# Patient Record
Sex: Male | Born: 1960 | Race: White | Hispanic: No | Marital: Single | Smoking: Never smoker
Health system: Southern US, Community
[De-identification: ages and names within clinical notes are randomized; demographics above are authoritative.]

## PROBLEM LIST (undated history)

## (undated) DIAGNOSIS — E1140 Type 2 diabetes mellitus with diabetic neuropathy, unspecified: Secondary | ICD-10-CM

## (undated) DIAGNOSIS — M1990 Unspecified osteoarthritis, unspecified site: Secondary | ICD-10-CM

## (undated) DIAGNOSIS — D649 Anemia, unspecified: Secondary | ICD-10-CM

## (undated) DIAGNOSIS — M869 Osteomyelitis, unspecified: Secondary | ICD-10-CM

## (undated) DIAGNOSIS — R51 Headache: Secondary | ICD-10-CM

## (undated) DIAGNOSIS — I10 Essential (primary) hypertension: Secondary | ICD-10-CM

## (undated) DIAGNOSIS — R011 Cardiac murmur, unspecified: Secondary | ICD-10-CM

## (undated) DIAGNOSIS — I739 Peripheral vascular disease, unspecified: Secondary | ICD-10-CM

## (undated) DIAGNOSIS — R519 Headache, unspecified: Secondary | ICD-10-CM

## (undated) DIAGNOSIS — E119 Type 2 diabetes mellitus without complications: Secondary | ICD-10-CM

## (undated) HISTORY — DX: Type 2 diabetes mellitus without complications: E11.9

## (undated) HISTORY — PX: OTHER SURGICAL HISTORY: SHX169

## (undated) HISTORY — PX: KNEE SURGERY: SHX244

---

## 2001-10-11 ENCOUNTER — Encounter: Payer: Self-pay | Admitting: Emergency Medicine

## 2001-10-11 ENCOUNTER — Emergency Department (HOSPITAL_COMMUNITY): Admission: EM | Admit: 2001-10-11 | Discharge: 2001-10-12 | Payer: Self-pay | Admitting: Emergency Medicine

## 2001-10-12 ENCOUNTER — Encounter (HOSPITAL_COMMUNITY): Admission: RE | Admit: 2001-10-12 | Discharge: 2001-10-31 | Payer: Self-pay | Admitting: Emergency Medicine

## 2014-01-11 ENCOUNTER — Emergency Department (HOSPITAL_COMMUNITY): Payer: Self-pay

## 2014-01-11 ENCOUNTER — Inpatient Hospital Stay (HOSPITAL_COMMUNITY)
Admission: EM | Admit: 2014-01-11 | Discharge: 2014-01-15 | DRG: 617 | Disposition: A | Discharge status: Home Health | Payer: Self-pay | Attending: Internal Medicine | Admitting: Internal Medicine

## 2014-01-11 ENCOUNTER — Encounter (HOSPITAL_COMMUNITY): Payer: Self-pay | Admitting: Emergency Medicine

## 2014-01-11 DIAGNOSIS — L02419 Cutaneous abscess of limb, unspecified: Secondary | ICD-10-CM

## 2014-01-11 DIAGNOSIS — M19072 Primary osteoarthritis, left ankle and foot: Secondary | ICD-10-CM

## 2014-01-11 DIAGNOSIS — I739 Peripheral vascular disease, unspecified: Secondary | ICD-10-CM

## 2014-01-11 DIAGNOSIS — E1152 Type 2 diabetes mellitus with diabetic peripheral angiopathy with gangrene: Secondary | ICD-10-CM | POA: Diagnosis present

## 2014-01-11 DIAGNOSIS — E1165 Type 2 diabetes mellitus with hyperglycemia: Principal | ICD-10-CM | POA: Diagnosis present

## 2014-01-11 DIAGNOSIS — E871 Hypo-osmolality and hyponatremia: Secondary | ICD-10-CM | POA: Diagnosis present

## 2014-01-11 DIAGNOSIS — R739 Hyperglycemia, unspecified: Secondary | ICD-10-CM

## 2014-01-11 DIAGNOSIS — R7309 Other abnormal glucose: Secondary | ICD-10-CM

## 2014-01-11 DIAGNOSIS — D72829 Elevated white blood cell count, unspecified: Secondary | ICD-10-CM | POA: Diagnosis present

## 2014-01-11 DIAGNOSIS — R7881 Bacteremia: Secondary | ICD-10-CM | POA: Diagnosis present

## 2014-01-11 DIAGNOSIS — E1159 Type 2 diabetes mellitus with other circulatory complications: Secondary | ICD-10-CM | POA: Diagnosis present

## 2014-01-11 DIAGNOSIS — E11621 Type 2 diabetes mellitus with foot ulcer: Secondary | ICD-10-CM

## 2014-01-11 DIAGNOSIS — M86179 Other acute osteomyelitis, unspecified ankle and foot: Secondary | ICD-10-CM | POA: Diagnosis present

## 2014-01-11 DIAGNOSIS — M9080 Osteopathy in diseases classified elsewhere, unspecified site: Secondary | ICD-10-CM | POA: Diagnosis present

## 2014-01-11 DIAGNOSIS — M869 Osteomyelitis, unspecified: Secondary | ICD-10-CM | POA: Diagnosis present

## 2014-01-11 DIAGNOSIS — L02619 Cutaneous abscess of unspecified foot: Secondary | ICD-10-CM | POA: Diagnosis present

## 2014-01-11 DIAGNOSIS — E1169 Type 2 diabetes mellitus with other specified complication: Principal | ICD-10-CM

## 2014-01-11 DIAGNOSIS — E119 Type 2 diabetes mellitus without complications: Secondary | ICD-10-CM | POA: Diagnosis present

## 2014-01-11 DIAGNOSIS — L03119 Cellulitis of unspecified part of limb: Secondary | ICD-10-CM | POA: Diagnosis present

## 2014-01-11 DIAGNOSIS — Z113 Encounter for screening for infections with a predominantly sexual mode of transmission: Secondary | ICD-10-CM

## 2014-01-11 DIAGNOSIS — L089 Local infection of the skin and subcutaneous tissue, unspecified: Secondary | ICD-10-CM

## 2014-01-11 DIAGNOSIS — I96 Gangrene, not elsewhere classified: Secondary | ICD-10-CM | POA: Diagnosis present

## 2014-01-11 DIAGNOSIS — L97509 Non-pressure chronic ulcer of other part of unspecified foot with unspecified severity: Secondary | ICD-10-CM | POA: Diagnosis present

## 2014-01-11 DIAGNOSIS — L97809 Non-pressure chronic ulcer of other part of unspecified lower leg with unspecified severity: Secondary | ICD-10-CM | POA: Diagnosis present

## 2014-01-11 DIAGNOSIS — M86171 Other acute osteomyelitis, right ankle and foot: Secondary | ICD-10-CM | POA: Diagnosis present

## 2014-01-11 DIAGNOSIS — IMO0002 Reserved for concepts with insufficient information to code with codable children: Principal | ICD-10-CM | POA: Diagnosis present

## 2014-01-11 DIAGNOSIS — B9689 Other specified bacterial agents as the cause of diseases classified elsewhere: Secondary | ICD-10-CM | POA: Diagnosis present

## 2014-01-11 LAB — CBC WITH DIFFERENTIAL/PLATELET
Basophils Absolute: 0 10*3/uL (ref 0.0–0.1)
Basophils Relative: 0 % (ref 0–1)
Eosinophils Absolute: 0.2 10*3/uL (ref 0.0–0.7)
Eosinophils Relative: 1 % (ref 0–5)
HCT: 40.6 % (ref 39.0–52.0)
Hemoglobin: 13.5 g/dL (ref 13.0–17.0)
Lymphocytes Relative: 7 % — ABNORMAL LOW (ref 12–46)
Lymphs Abs: 1.2 10*3/uL (ref 0.7–4.0)
MCH: 31 pg (ref 26.0–34.0)
MCHC: 33.3 g/dL (ref 30.0–36.0)
MCV: 93.1 fL (ref 78.0–100.0)
Monocytes Absolute: 1.1 10*3/uL — ABNORMAL HIGH (ref 0.1–1.0)
Monocytes Relative: 6 % (ref 3–12)
Neutro Abs: 14.2 10*3/uL — ABNORMAL HIGH (ref 1.7–7.7)
Neutrophils Relative %: 86 % — ABNORMAL HIGH (ref 43–77)
Platelets: 387 10*3/uL (ref 150–400)
RBC: 4.36 MIL/uL (ref 4.22–5.81)
RDW: 12.5 % (ref 11.5–15.5)
WBC: 16.8 10*3/uL — ABNORMAL HIGH (ref 4.0–10.5)

## 2014-01-11 LAB — CBG MONITORING, ED
GLUCOSE-CAPILLARY: 77 mg/dL (ref 70–99)
Glucose-Capillary: 294 mg/dL — ABNORMAL HIGH (ref 70–99)

## 2014-01-11 LAB — I-STAT TROPONIN, ED: TROPONIN I, POC: 0 ng/mL (ref 0.00–0.08)

## 2014-01-11 LAB — COMPREHENSIVE METABOLIC PANEL
ALT: 12 U/L (ref 0–53)
AST: 14 U/L (ref 0–37)
Albumin: 2.9 g/dL — ABNORMAL LOW (ref 3.5–5.2)
Alkaline Phosphatase: 68 U/L (ref 39–117)
BUN: 13 mg/dL (ref 6–23)
CO2: 23 mEq/L (ref 19–32)
Calcium: 8.9 mg/dL (ref 8.4–10.5)
Chloride: 89 mEq/L — ABNORMAL LOW (ref 96–112)
Creatinine, Ser: 0.87 mg/dL (ref 0.50–1.35)
GFR calc Af Amer: >90 mL/min (ref >90)
GFR calc non Af Amer: >90 mL/min (ref >90)
Glucose, Bld: 327 mg/dL — ABNORMAL HIGH (ref 70–99)
Potassium: 4.1 mEq/L (ref 3.7–5.3)
Sodium: 126 mEq/L — ABNORMAL LOW (ref 137–147)
Total Bilirubin: 0.3 mg/dL (ref 0.3–1.2)
Total Protein: 8.6 g/dL — ABNORMAL HIGH (ref 6.0–8.3)

## 2014-01-11 LAB — I-STAT CG4 LACTIC ACID, ED: Lactic Acid, Venous: 1.58 mmol/L (ref 0.5–2.2)

## 2014-01-11 LAB — PRO B NATRIURETIC PEPTIDE: Pro B Natriuretic peptide (BNP): 148.5 pg/mL — ABNORMAL HIGH (ref 0–125)

## 2014-01-11 MED ORDER — VANCOMYCIN HCL IN DEXTROSE 1-5 GM/200ML-% IV SOLN
1000.0000 mg | Freq: Once | INTRAVENOUS | Status: AC
  Administered 2014-01-11: 1000 mg via INTRAVENOUS
  Filled 2014-01-11: qty 200

## 2014-01-11 MED ORDER — SODIUM CHLORIDE 0.9 % IV SOLN
INTRAVENOUS | Status: DC
  Administered 2014-01-11 – 2014-01-13 (×3): via INTRAVENOUS

## 2014-01-11 MED ORDER — OXYCODONE HCL 5 MG PO TABS: 5.0000 mg | ORAL_TABLET | ORAL | Status: DC | PRN

## 2014-01-11 MED ORDER — ONDANSETRON HCL 4 MG PO TABS: 4.0000 mg | ORAL_TABLET | Freq: Four times a day (QID) | ORAL | Status: DC | PRN

## 2014-01-11 MED ORDER — INSULIN ASPART 100 UNIT/ML ~~LOC~~ SOLN
0.0000 [IU] | Freq: Three times a day (TID) | SUBCUTANEOUS | Status: DC
  Administered 2014-01-12: 2 [IU] via SUBCUTANEOUS
  Administered 2014-01-12: 3 [IU] via SUBCUTANEOUS
  Administered 2014-01-14 – 2014-01-15 (×3): 2 [IU] via SUBCUTANEOUS
  Administered 2014-01-15: 3 [IU] via SUBCUTANEOUS

## 2014-01-11 MED ORDER — ACETAMINOPHEN 325 MG PO TABS: 650.0000 mg | ORAL_TABLET | Freq: Four times a day (QID) | ORAL | Status: DC | PRN

## 2014-01-11 MED ORDER — INSULIN ASPART 100 UNIT/ML ~~LOC~~ SOLN
5.0000 [IU] | Freq: Once | SUBCUTANEOUS | Status: AC
  Administered 2014-01-11: 5 [IU] via INTRAVENOUS
  Filled 2014-01-11: qty 1

## 2014-01-11 MED ORDER — MORPHINE SULFATE 2 MG/ML IJ SOLN: 2.0000 mg | INTRAMUSCULAR | Status: DC | PRN

## 2014-01-11 MED ORDER — INSULIN DETEMIR 100 UNIT/ML ~~LOC~~ SOLN
10.0000 [IU] | Freq: Every day | SUBCUTANEOUS | Status: DC
  Administered 2014-01-11 – 2014-01-15 (×5): 10 [IU] via SUBCUTANEOUS
  Filled 2014-01-11 (×5): qty 0.1

## 2014-01-11 MED ORDER — ACETAMINOPHEN 650 MG RE SUPP: 650.0000 mg | Freq: Four times a day (QID) | RECTAL | Status: DC | PRN

## 2014-01-11 MED ORDER — ENOXAPARIN SODIUM 40 MG/0.4ML ~~LOC~~ SOLN
40.0000 mg | Freq: Every day | SUBCUTANEOUS | Status: DC
  Administered 2014-01-11 – 2014-01-12 (×2): 40 mg via SUBCUTANEOUS
  Filled 2014-01-11 (×3): qty 0.4

## 2014-01-11 MED ORDER — SODIUM CHLORIDE 0.9 % IV SOLN: INTRAVENOUS | Status: DC

## 2014-01-11 MED ORDER — PIPERACILLIN-TAZOBACTAM 3.375 G IVPB
3.3750 g | Freq: Three times a day (TID) | INTRAVENOUS | Status: DC
  Administered 2014-01-11 – 2014-01-14 (×9): 3.375 g via INTRAVENOUS
  Filled 2014-01-11 (×10): qty 50

## 2014-01-11 MED ORDER — ONDANSETRON HCL 4 MG/2ML IJ SOLN: 4.0000 mg | Freq: Four times a day (QID) | INTRAMUSCULAR | Status: DC | PRN

## 2014-01-11 MED ORDER — INSULIN ASPART 100 UNIT/ML ~~LOC~~ SOLN
0.0000 [IU] | Freq: Every day | SUBCUTANEOUS | Status: DC
  Administered 2014-01-11 – 2014-01-13 (×2): 2 [IU] via SUBCUTANEOUS

## 2014-01-11 MED ORDER — SODIUM CHLORIDE 0.9 % IV SOLN: INTRAVENOUS | Status: AC

## 2014-01-11 NOTE — ED Notes (Signed)
Pt is in X-ray. Will get EKG when he returns.

## 2014-01-11 NOTE — ED Notes (Signed)
Pt reports bil necrosis to feet. Right big toe is black. On left foot diabetic ulcers present. Pt denies pain. Friend reports 3 weeks ago pts feet were completely normal. Pt reports he believes he is diabetic but has never been dx and cant afford to go to the doctor.

## 2014-01-11 NOTE — ED Notes (Signed)
Attempted to call report to [phone]. RN unavailable. Will call back.

## 2014-01-11 NOTE — ED Notes (Signed)
Pt states he is unable to void at this time.

## 2014-01-11 NOTE — ED Provider Notes (Signed)
CSN: [id number]     Arrival date & time 01/11/14  1823 History   First MD Initiated Contact with Patient 01/11/14 1838     Chief Complaint  Patient presents with  . necrosis to bil feet      (Consider location/radiation/quality/duration/timing/severity/associated sxs/prior Treatment) The history is provided by the patient.   53 year old male with unknown history of diabetes. But due to to a neighbor started doing his blood sugar test as several weeks ago. Noted his blood sugars were high and treating himself with diet. Patient now presents with bilateral necrosis to the feet right greater than left. Right great toe is black and necrotic with dry gangrene. Also has redness to the right foot and sores along the left foot and leg. Patient denies any symptoms states really no pain has no sensation in his feet. The patient has numbness but denies fevers nausea vomiting chest pain shortness of breath.  History reviewed. No pertinent past medical history. History reviewed. No pertinent past surgical history. History reviewed. No pertinent family history. History  Substance Use Topics  . Smoking status: Never Smoker   . Smokeless tobacco: Not on file  . Alcohol Use: Yes     Comment: occasionally    Review of Systems  Constitutional: Negative for fever.  HENT: Negative for congestion.   Eyes: Negative for visual disturbance.  Respiratory: Negative for shortness of breath.   Cardiovascular: Negative for chest pain.  Gastrointestinal: Negative for nausea, vomiting and abdominal pain.  Endocrine: Positive for polydipsia and polyuria.  Genitourinary: Negative for dysuria.  Musculoskeletal: Negative for back pain.  Skin: Positive for wound.  Neurological: Positive for numbness.  Hematological: Does not bruise/bleed easily.  Psychiatric/Behavioral: Negative for confusion.      Allergies  Review of patient's allergies indicates no known allergies.  Home Medications   Prior to Admission  medications   Not on File   BP 129/91  Pulse 108  Temp(Src) 99.5 F (37.5 C) (Oral)  Resp 14  Wt 168 lb (76.204 kg)  SpO2 99% Physical Exam  Nursing note and vitals reviewed. Constitutional: He is oriented to person, place, and time. He appears well-developed and well-nourished. No distress.  HENT:  Head: Normocephalic and atraumatic.  Mouth/Throat: Oropharynx is clear and moist.  Eyes: Conjunctivae and EOM are normal. Pupils are equal, round, and reactive to light.  Neck: Normal range of motion.  Cardiovascular: Regular rhythm and normal heart sounds.   Slightly tachycardic  Pulmonary/Chest: Effort normal and breath sounds normal. No respiratory distress.  Abdominal: Soft. Bowel sounds are normal. There is no tenderness.  Musculoskeletal: He exhibits edema. He exhibits no tenderness.  Right foot with black and dry necrotic great toe. Proximal part of the toe he and forefoot with erythema and crepitance. No pain. Cap refill to the second is normal. Ferritin is redness up of to just proximal to the ankle. The left foot with a dry ulcer at the tip of the left great toe. Shin and foot with dry ulcers. With some surrounding erythema. No crepitance. Cap refill there is about 3 seconds.  Neurological: He is alert and oriented to person, place, and time. No cranial nerve deficit. He exhibits normal muscle tone. Coordination normal.  Skin: Skin is warm. There is erythema.    ED Course  Procedures (including critical care time) Labs Review Labs Reviewed  CBC WITH DIFFERENTIAL - Abnormal; Notable for the following:    WBC 16.8 (*)    Neutrophils Relative % 86 (*)  Neutro Abs 14.2 (*)    Lymphocytes Relative 7 (*)    Monocytes Absolute 1.1 (*)    All other components within normal limits  COMPREHENSIVE METABOLIC PANEL - Abnormal; Notable for the following:    Sodium 126 (*)    Chloride 89 (*)    Glucose, Bld 327 (*)    Total Protein 8.6 (*)    Albumin 2.9 (*)    All other  components within normal limits  PRO B NATRIURETIC PEPTIDE - Abnormal; Notable for the following:    Pro B Natriuretic peptide (BNP) 148.5 (*)    All other components within normal limits  CBG MONITORING, ED - Abnormal; Notable for the following:    Glucose-Capillary 294 (*)    All other components within normal limits  CULTURE, BLOOD (ROUTINE X 2)  CULTURE, BLOOD (ROUTINE X 2)  URINALYSIS, ROUTINE W REFLEX MICROSCOPIC  I-STAT CG4 LACTIC ACID, ED  I-STAT TROPOININ, ED   Results for orders placed during the hospital encounter of 01/11/14  CBC WITH DIFFERENTIAL      Result Value Ref Range   WBC 16.8 (*) 4.0 - 10.5 K/uL   RBC 4.36  4.22 - 5.81 MIL/uL   Hemoglobin 13.5  13.0 - 17.0 g/dL   HCT 16.1  09.6 - 04.5 %   MCV 93.1  78.0 - 100.0 fL   MCH 31.0  26.0 - 34.0 pg   MCHC 33.3  30.0 - 36.0 g/dL   RDW 40.9  81.1 - 91.4 %   Platelets 387  150 - 400 K/uL   Neutrophils Relative % 86 (*) 43 - 77 %   Neutro Abs 14.2 (*) 1.7 - 7.7 K/uL   Lymphocytes Relative 7 (*) 12 - 46 %   Lymphs Abs 1.2  0.7 - 4.0 K/uL   Monocytes Relative 6  3 - 12 %   Monocytes Absolute 1.1 (*) 0.1 - 1.0 K/uL   Eosinophils Relative 1  0 - 5 %   Eosinophils Absolute 0.2  0.0 - 0.7 K/uL   Basophils Relative 0  0 - 1 %   Basophils Absolute 0.0  0.0 - 0.1 K/uL  COMPREHENSIVE METABOLIC PANEL      Result Value Ref Range   Sodium 126 (*) 137 - 147 mEq/L   Potassium 4.1  3.7 - 5.3 mEq/L   Chloride 89 (*) 96 - 112 mEq/L   CO2 23  19 - 32 mEq/L   Glucose, Bld 327 (*) 70 - 99 mg/dL   BUN 13  6 - 23 mg/dL   Creatinine, Ser 7.82  0.50 - 1.35 mg/dL   Calcium 8.9  8.4 - 95.6 mg/dL   Total Protein 8.6 (*) 6.0 - 8.3 g/dL   Albumin 2.9 (*) 3.5 - 5.2 g/dL   AST 14  0 - 37 U/L   ALT 12  0 - 53 U/L   Alkaline Phosphatase 68  39 - 117 U/L   Total Bilirubin 0.3  0.3 - 1.2 mg/dL   GFR calc non Af Amer >90  >90 mL/min   GFR calc Af Amer >90  >90 mL/min  PRO B NATRIURETIC PEPTIDE      Result Value Ref Range   Pro B  Natriuretic peptide (BNP) 148.5 (*) 0 - 125 pg/mL  I-STAT CG4 LACTIC ACID, ED      Result Value Ref Range   Lactic Acid, Venous 1.58  0.5 - 2.2 mmol/L  I-STAT TROPOININ, ED      Result Value Ref  Range   Troponin i, poc 0.00  0.00 - 0.08 ng/mL   Comment 3           CBG MONITORING, ED      Result Value Ref Range   Glucose-Capillary 294 (*) 70 - 99 mg/dL   Comment 1 Documented in Chart     Comment 2 Notify RN       Imaging Review Dg Tibia/fibula Left  01/11/2014   CLINICAL DATA:  Painful, bilateral painful feet and lower legs. Redness, swelling.  EXAM: LEFT TIBIA AND FIBULA - 2 VIEW  COMPARISON:  None.  FINDINGS: No acute bony abnormality. Specifically, no fracture, subluxation, or dislocation. Soft tissues are intact. 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 No knee joint effusion.  IMPRESSION: No acute bony abnormality.   Electronically Signed   By: Charlett Nose M.D.   On: 01/11/2014 19:53   Dg Tibia/fibula Right  01/11/2014   CLINICAL DATA:  Bilateral lower extremity redness, swelling, pain.  EXAM: RIGHT TIBIA AND FIBULA - 2 VIEW  COMPARISON:  None.  FINDINGS: Mild to moderate degenerative changes within the right knee with joint space narrowing and spurring. No acute bony abnormality. Specifically, no fracture, subluxation, or dislocation. Soft tissues are intact.  IMPRESSION: No acute bony abnormality.   Electronically Signed   By: Charlett Nose M.D.   On: 01/11/2014 19:54   Dg Chest Port 1 View  01/11/2014   CLINICAL DATA:  Bilateral lower extremity infections.  EXAM: PORTABLE CHEST - 1 VIEW  COMPARISON:  None.  FINDINGS: The heart size and mediastinal contours are within normal limits. Both lungs are clear. The visualized skeletal structures are unremarkable.  IMPRESSION: No active disease.   Electronically Signed   By: Charlett Nose M.D.   On: 01/11/2014 19:25   Dg Foot Complete Left  01/11/2014   CLINICAL DATA:  Bilateral foot pain, redness, swelling.  EXAM: LEFT FOOT - COMPLETE 3+ VIEW   COMPARISON:  None.  FINDINGS: There is irregular lucency at the tip of the left great toe distal phalanx concerning for osteomyelitis. There appears to be a overlying soft tissue defect at the tip of the great toe. Recommend clinical correlation.  No additional acute bony abnormality. No fracture, subluxation or dislocation.  IMPRESSION: Findings suspicious for osteomyelitis at the tip of the left great toe distal phalanx.   Electronically Signed   By: Charlett Nose M.D.   On: 01/11/2014 19:53   Dg Foot Complete Right  01/11/2014   CLINICAL DATA:  Infection.  EXAM: RIGHT FOOT COMPLETE - 3+ VIEW  COMPARISON:  Ankle films of the same day.  FINDINGS: Extensive gas surrounds the distal phalanx in the right great toe. There is marked osteopenia and erosion of the tuft. There is mild osteopenia in the distal aspect of the proximal phalanx is well. The joint is located.  Extensive soft tissue swelling is noted in the distal second toe is well with a sclerotic foreshortened distal phalanx. No other focal bone changes are evident. The proximal foot is unremarkable.  IMPRESSION: 1. Osteomyelitis of the distal phalanx in the great toe with a soft tissue gas producing abscess. 2. Soft tissue swelling about the distal aspect second toe with a sclerotic foreshortened distal phalanx. This may represent chronic infection.   Electronically Signed   By: Gennette Pac M.D.   On: 01/11/2014 19:55     EKG Interpretation   Date/Time:  Sunday January 11 2014 20:03:25 EDT Ventricular Rate:  88 PR Interval:  166 QRS  Duration: 88 QT Interval:  380 QTC Calculation: 460 R Axis:   63 Text Interpretation:  Sinus rhythm No previous ECGs available Confirmed by  Lima Chillemi  MD, Alfa Leibensperger [phone]([id number]) on 01/11/2014 8:18:24 PM      MDM   Final diagnoses:  Hyperglycemia  Diabetes  Osteomyelitis  Foot infection    Patient with untreated diabetes for unknown period of time. Patient started using a neighbors of blood sugar testing a few  months ago and noted that he had diabetes. The patient was treating himself with diet but no formal treatment by a physician. Patient presents today with bilateral foot infection right great toe is necrotic M. and dry gangrene. There is wet gangrene on the proximal part of the great toe the forefoot. X-ray shows gas in that area. There is evidence of osteomyelitis. Also evidence of osteo-myelitis of the left great toe. Blood sugar elevated but no evidence of DKA. Leukocytosis. Patient was cultured and started on vancomycin. The right great toe has no oozing pus. Patient has good cap refill to the rest of the right foot that is 1 second.    Vanetta MuldersScott Brandii Lakey, MD 01/11/14 2035

## 2014-01-11 NOTE — ED Notes (Signed)
Asked pt for urine sample pt said he couldn't at the moment will try again

## 2014-01-11 NOTE — H&P (Signed)
[facility]                                                                                    Patient Demographics  Ryan [name], is a 53 y.o. male  CSN: [id number]  MRN: [id number]  DOB - [birth year]  Admit Date - 01/11/2014  Outpatient Primary MD for the patient is No primary provider on file.   With History of -  History reviewed. No pertinent past medical history.    History reviewed. No pertinent past surgical history.  in for   Chief Complaint  Patient presents with  . necrosis to bil feet      HPI  Ryan [name]  is a 53 y.o. male, with no significant past medical history who recently found out that he is diabetic 3 weeks ago while using a friend's glucometer presenting with 3 weeks history of lower extremity ulcers that have been progressing quickly. He took her off a friend of his who was being treated for MRSA and she died 2 weeks ago. The patient denies any previous episodes of pain or ulcers in his lower or upper extremities, no history of chest pains or shortness of breath. He first noticed this ulcer 3 weeks ago and they progressed to a right first toe gangrene and his friends brought him here to be evaluated. Patient reports taking his.friend's insulin to treat himself .    Review of Systems    In addition to the HPI above,  No Fever-chills, No Headache, No changes with Vision or hearing, No problems swallowing food or Liquids, No Chest pain, Cough or Shortness of Breath, No Abdominal pain, No Nausea or Vommitting, Bowel movements are regular, No Blood in stool or Urine, No dysuria, No new joints pains-aches,  No new weakness, tingling, numbness in any extremity, No recent weight gain or loss, No polyuria, polydypsia or polyphagia, No significant Mental Stressors.  A full 10 point Review of Systems was done, except as stated above, all other Review of Systems were negative.   Social History History  Substance Use Topics  .  Smoking status: Never Smoker   . Smokeless tobacco: Not on file  . Alcohol Use: Yes     Comment: occasionally     Family History Past and reviewed; patient doesn't know his family and was raised in multiple boarding houses  Prior to Admission medications   Medication Sig Start Date End Date Taking? Authorizing Provider  Cyanocobalamin (B-12 PO) Take 1 tablet by mouth daily.   Yes Historical Provider, MD  UNABLE TO FIND Apply 1 application topically daily. Silver shield gel   Yes Historical Provider, MD    No Known Allergies  Physical Exam  Vitals  Blood pressure 129/91, pulse 108, temperature 99.5 F (37.5 C), temperature source Oral, resp. rate 14, weight 76.204 kg (168 lb), SpO2 99.00%.   1. General in no acute distress, very pleasant  2. Apathic affect and insight, Not Suicidal or Homicidal, Awake Alert, Oriented X 3.  3. No obvious F.N deficits, ALL C.Nerves Intact, Strength 5/5 all 4 extremities, Sensation decreased in lower extremities ,   4. Ears and Eyes appear Normal, Conjunctivae clear,  PERRLA. Moist Oral Mucosa.  5. Supple Neck, No JVD, No cervical lymphadenopathy appriciated, No Carotid Bruits.  6. Symmetrical Chest wall movement, Good air movement bilaterally, CTAB.  7. RRR, No Gallops, Rubs or Murmurs, No Parasternal Heave.  8. Positive Bowel Sounds, Abdomen Soft, Non tender, No organomegaly appriciated,No rebound -guarding or rigidity.  9. right first toe dry gangrene with redness and swelling involving the distal right foot. 2 dry ulcers noted on the dorsal aspect of the left foot with swelling and redness around them . One of the ulcers is 2 cm in size and the other one was 4 cm. Significant redness noted around the ulcers  10. Good muscle tone,  joints appear normal , no effusions, Normal ROM.  11. No Palpable Lymph Nodes in Neck or Axillae    Data Review  CBC  Recent Labs Lab 01/11/14 1855  WBC 16.8*  HGB 13.5  HCT 40.6  PLT 387  MCV  93.1  MCH 31.0  MCHC 33.3  RDW 12.5  LYMPHSABS 1.2  MONOABS 1.1*  EOSABS 0.2  BASOSABS 0.0   ------------------------------------------------------------------------------------------------------------------  Chemistries   Recent Labs Lab 01/11/14 1855  NA 126*  K 4.1  CL 89*  CO2 23  GLUCOSE 327*  BUN 13  CREATININE 0.87  CALCIUM 8.9  AST 14  ALT 12  ALKPHOS 68  BILITOT 0.3   ------------------------------------------------------------------------------------------------------------------ CrCl is unknown because there is no height on file for the current visit. ------------------------------------------------------------------------------------------------------------------ No results found for this basename: TSH, T4TOTAL, FREET3, T3FREE, THYROIDAB,  in the last 72 hours   Coagulation profile No results found for this basename: INR, PROTIME,  in the last 168 hours ------------------------------------------------------------------------------------------------------------------- No results found for this basename: DDIMER,  in the last 72 hours -------------------------------------------------------------------------------------------------------------------  Cardiac Enzymes No results found for this basename: CK, CKMB, TROPONINI, MYOGLOBIN,  in the last 168 hours ------------------------------------------------------------------------------------------------------------------ No components found with this basename: POCBNP,    ---------------------------------------------------------------------------------------------------------------  Urinalysis No results found for this basename: colorurine, appearanceur, labspec, phurine, glucoseu, hgbur, bilirubinur, ketonesur, proteinur, urobilinogen, nitrite, leukocytesur    ----------------------------------------------------------------------------------------------------------------   Imaging results:   Dg  Tibia/fibula Left  01/11/2014   CLINICAL DATA:  Painful, bilateral painful feet and lower legs. Redness, swelling.  EXAM: LEFT TIBIA AND FIBULA - 2 VIEW  COMPARISON:  None.  FINDINGS: No acute bony abnormality. Specifically, no fracture, subluxation, or dislocation. Soft tissues are intact. 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 No knee joint effusion.  IMPRESSION: No acute bony abnormality.   Electronically Signed   By: Rolm Baptise M.D.   On: 01/11/2014 19:53   Dg Tibia/fibula Right  01/11/2014   CLINICAL DATA:  Bilateral lower extremity redness, swelling, pain.  EXAM: RIGHT TIBIA AND FIBULA - 2 VIEW  COMPARISON:  None.  FINDINGS: Mild to moderate degenerative changes within the right knee with joint space narrowing and spurring. No acute bony abnormality. Specifically, no fracture, subluxation, or dislocation. Soft tissues are intact.  IMPRESSION: No acute bony abnormality.   Electronically Signed   By: Rolm Baptise M.D.   On: 01/11/2014 19:54   Dg Chest Port 1 View  01/11/2014   CLINICAL DATA:  Bilateral lower extremity infections.  EXAM: PORTABLE CHEST - 1 VIEW  COMPARISON:  None.  FINDINGS: The heart size and mediastinal contours are within normal limits. Both lungs are clear. The visualized skeletal structures are unremarkable.  IMPRESSION: No active disease.   Electronically Signed   By: Rolm Baptise M.D.   On: 01/11/2014 19:25  Dg Foot Complete Left  01/11/2014   CLINICAL DATA:  Bilateral foot pain, redness, swelling.  EXAM: LEFT FOOT - COMPLETE 3+ VIEW  COMPARISON:  None.  FINDINGS: There is irregular lucency at the tip of the left great toe distal phalanx concerning for osteomyelitis. There appears to be a overlying soft tissue defect at the tip of the great toe. Recommend clinical correlation.  No additional acute bony abnormality. No fracture, subluxation or dislocation.  IMPRESSION: Findings suspicious for osteomyelitis at the tip of the left great toe distal phalanx.   Electronically Signed   By:  Rolm Baptise M.D.   On: 01/11/2014 19:53   Dg Foot Complete Right  01/11/2014   CLINICAL DATA:  Infection.  EXAM: RIGHT FOOT COMPLETE - 3+ VIEW  COMPARISON:  Ankle films of the same day.  FINDINGS: Extensive gas surrounds the distal phalanx in the right great toe. There is marked osteopenia and erosion of the tuft. There is mild osteopenia in the distal aspect of the proximal phalanx is well. The joint is located.  Extensive soft tissue swelling is noted in the distal second toe is well with a sclerotic foreshortened distal phalanx. No other focal bone changes are evident. The proximal foot is unremarkable.  IMPRESSION: 1. Osteomyelitis of the distal phalanx in the great toe with a soft tissue gas producing abscess. 2. Soft tissue swelling about the distal aspect second toe with a sclerotic foreshortened distal phalanx. This may represent chronic infection.   Electronically Signed   By: Lawrence Santiago M.D.   On: 01/11/2014 19:55    My personal review of EKG: Rhythm NSR, Rate 88 /min,, no Acute ST changes    Assessment & Plan  1. 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 2. Left foot diabetic ulcers with questionable osteomyelitis of the left first toe 3. Newly diagnosed diabetes mellitus not on medications. 4. Hyponatremia probably dilutional due to hyperglycemia 5. Recent exposure to MRSA  Plan IV antibiotics, vancomycin, clindamycin, and Zosyn Lower extremities arterial Doppler Blood cultures taken Wound cultures taken in the emergency room MRI of the feet Surgical consult in a.m. ID consult in a.m. Check ESR Consult to diabetes coordinator for diabetic teaching Insulin sliding scale with low dose Levemir and followup and titrate accordingly Diabetic diet  DVT Prophylaxis Lovenox  AM Labs Ordered, also please review Full Orders    Code Status full  Disposition Plan: Home with home health  Time spent in  minutes : 42 minutes  Condition GUARDED   @SIGNATURE @

## 2014-01-11 NOTE — ED Notes (Signed)
Patient transported to X-ray 

## 2014-01-11 NOTE — ED Notes (Signed)
Pt eating crackers and peanut butter. Drinking orange juice per request.

## 2014-01-12 ENCOUNTER — Inpatient Hospital Stay (HOSPITAL_COMMUNITY): Payer: Self-pay

## 2014-01-12 ENCOUNTER — Inpatient Hospital Stay (HOSPITAL_COMMUNITY): Payer: MEDICAID

## 2014-01-12 DIAGNOSIS — I96 Gangrene, not elsewhere classified: Secondary | ICD-10-CM

## 2014-01-12 DIAGNOSIS — E871 Hypo-osmolality and hyponatremia: Secondary | ICD-10-CM | POA: Diagnosis present

## 2014-01-12 DIAGNOSIS — L089 Local infection of the skin and subcutaneous tissue, unspecified: Secondary | ICD-10-CM

## 2014-01-12 DIAGNOSIS — D72829 Elevated white blood cell count, unspecified: Secondary | ICD-10-CM | POA: Diagnosis present

## 2014-01-12 LAB — URINE MICROSCOPIC-ADD ON

## 2014-01-12 LAB — URINALYSIS, ROUTINE W REFLEX MICROSCOPIC
Bilirubin Urine: NEGATIVE
Glucose, UA: 250 mg/dL — AB
KETONES UR: NEGATIVE mg/dL
Leukocytes, UA: NEGATIVE
NITRITE: NEGATIVE
Protein, ur: NEGATIVE mg/dL
Specific Gravity, Urine: 1.019 (ref 1.005–1.030)
Urobilinogen, UA: 1 mg/dL (ref 0.0–1.0)
pH: 6 (ref 5.0–8.0)

## 2014-01-12 LAB — SEDIMENTATION RATE: SED RATE: 88 mm/h — AB (ref 0–16)

## 2014-01-12 LAB — HEMOGLOBIN A1C
Hgb A1c MFr Bld: 7.6 % — ABNORMAL HIGH (ref <5.7)
Mean Plasma Glucose: 171 mg/dL — ABNORMAL HIGH (ref <117)

## 2014-01-12 MED ORDER — GADOBENATE DIMEGLUMINE 529 MG/ML IV SOLN
15.0000 mL | Freq: Once | INTRAVENOUS | Status: AC | PRN
  Administered 2014-01-12: 15 mL via INTRAVENOUS

## 2014-01-12 MED ORDER — PNEUMOCOCCAL VAC POLYVALENT 25 MCG/0.5ML IJ INJ
0.5000 mL | INJECTION | INTRAMUSCULAR | Status: AC
  Administered 2014-01-13: 0.5 mL via INTRAMUSCULAR
  Filled 2014-01-12 (×2): qty 0.5

## 2014-01-12 MED ORDER — VANCOMYCIN HCL IN DEXTROSE 1-5 GM/200ML-% IV SOLN
1000.0000 mg | Freq: Three times a day (TID) | INTRAVENOUS | Status: DC
  Administered 2014-01-12 – 2014-01-14 (×8): 1000 mg via INTRAVENOUS
  Filled 2014-01-12 (×9): qty 200

## 2014-01-12 MED ORDER — LIVING WELL WITH DIABETES BOOK
Freq: Once | Status: AC
  Administered 2014-01-12: 10:00:00
  Filled 2014-01-12: qty 1

## 2014-01-12 NOTE — Progress Notes (Signed)
VASCULAR LAB PRELIMINARY  ARTERIAL  ABI completed:    RIGHT    LEFT    PRESSURE WAVEFORM  PRESSURE WAVEFORM  BRACHIAL 144 triphasic BRACHIAL 122 triphasic  DP   DP    AT 155 triphasic AT 98 Dampened monophasic  PT 129 triphasic PT 106 Dampened monophasic  PER   PER    GREAT TOE  NA GREAT TOE  NA    RIGHT LEFT  ABI >1.0 0.74     Alyria Krack, RVT 01/12/2014, 10:51 AM

## 2014-01-12 NOTE — Progress Notes (Signed)
CM CONSULT Numerous problems identified-  No PCP - apt made with Dr Ashley [facility]ws [facility] and unable to make an apt and referred to Dr Ashley RoyaltyMatthews office) - apt made for July 17,2015 at 10:45am. No insurance - the Financial counselor will see the patient to see if he qualifies for resources that may be available in the community to assist the pt; will possibly qualify [facility] ( [facility]) at discharge; [facility] so that patient can get his orange card to assist him with his medication ongoing - July 6,2015 at 2pm. Possible HHC at discharge, HHRN,PT, Disease Management Program for DM, patient NEEDS lots of education in this area.CM will continue to follow for DCP; B Shelba Flakehandler RN,BSN,MHA [phone]

## 2014-01-12 NOTE — Plan of Care (Signed)
Problem: Food- and Nutrition-Related Knowledge Deficit (NB-1.1) Goal: Nutrition education Formal process to instruct or train a patient/client in a skill or to impart knowledge to help patients/clients voluntarily manage or modify food choices and eating behavior to maintain or improve health. Outcome: Completed/Met Date Met:  01/12/14  RD consulted for nutrition education regarding diabetes.     No results found for this basename: HGBA1C    RD provided "Carbohydrate Counting for People with Diabetes" handout from the Academy of [employer] and Dietetics. Discussed different food groups and their effects on blood sugar, emphasizing carbohydrate-containing foods. Provided list of carbohydrates and recommended serving sizes of common foods.  Discussed importance of controlled and consistent carbohydrate intake throughout the day. Provided examples of ways to balance meals/snacks and encouraged intake of high-fiber, whole grain complex carbohydrates. Teach back method used.  Provided pt with "MyPlate Nutrition Guidelines" handout for additional examples for meal planning and to emphasis portion control.  Expect poor compliance. Pt did not appear to be listening during education as he has appeared to have preconceived notions/ideas on what constitutes a diabetic diet. Diet recall indicates pt consuming three meals/day of starchy vegetables, cereals, and fruits. Attempted to encouraged pt to consume whole grains/fiberous starches, increase intake of non-starchy vegetables and balance with heart healthy proteins; however, pt would often talk over RD, and go off topic on misinterpreted Internet research.   Informed RN of concern with pt's compliance with DM2 diet upon d/c. Recommend to re-consult if pt appears to be more willing for education and/or review of materials provided.  Body mass index is 22.84 kg/(m^2). Pt meets criteria for Normal based on current BMI.  Current diet order is Carb modified,  patient is consuming approximately 100% of meals at this time. Labs and medications reviewed. No further nutrition interventions warranted at this time. RD contact information provided. If additional nutrition issues arise, please re-consult RD.  Atlee Abide MS RD LDN Clinical Dietitian [phone]

## 2014-01-12 NOTE — Progress Notes (Signed)
Inpatient Diabetes Program Recommendations  AACE/ADA: New Consensus Statement on Inpatient Glycemic Control (2013)  Target Ranges:  Prepandial:   less than 140 mg/dL      Peak postprandial:   less than 180 mg/dL (1-2 hours)      Critically ill patients:  140 - 180 mg/dL   Reason for Visit: Diabetes Coordinator Consult-- New onset diabetes  Diabetes history: Patient discovered hyperglycemia several weeks ago Outpatient Diabetes medications: None.  Denies ever taking insulin prior to admission Current orders for Inpatient glycemic control: Levemir 10 units at HS, Novolog moderate correction scale tid and HS scale  Note:  Patient very talkative.  Has been searching Internet for diabetes information.  Seems to have some misconceptions.  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  Upon her death he kept her Freestyle meter.  He decided to check his own blood sugar and discovered elevations.  Since discovering his own hyperglycemia, he has tried to control his blood glucose my monitoring his CHO intake and eating smaller meals.  Patient states he should have recognized his own s/s of diabetes sooner.  Has noticed cold feet for a long time and intense thirst.  Has had an approximate 120 pound weight loss prior to admission-- not sure time span of weight loss-- but denies really trying to lose.  Attributes beginning of his own weight loss to not eating candy bars in presence of the elderly lady he took care of.  Have placed referral to dietitian.  Already has Case Management consult.  (Not only needs assistance with meds and a PCP, but afraid the power will be turned off by the time he gets home.)   Will follow. Thank you.  Sephiroth Mcluckie S. Elsie Lincolnouth, RN, CNS, CDE Inpatient Diabetes Program, team pager [phone]

## 2014-01-12 NOTE — Progress Notes (Signed)
ANTIBIOTIC CONSULT NOTE - INITIAL  Pharmacy Consult for Vancomycin and Zosyn  Indication: Osteomyelitis   No Known Allergies  Patient Measurements: Height: 5\' 11"  (180.3 cm) Weight: 163 lb 11.2 oz (74.254 kg) IBW/kg (Calculated) : 75.3 Adjusted Body Weight:   Vital Signs: Temp: 98.4 F (36.9 C) (06/14 2200) Temp src: Oral (06/14 2200) BP: 163/72 mmHg (06/14 2200) Pulse Rate: 93 (06/14 2200) Intake/Output from previous day:   Intake/Output from this shift:    Labs:  Recent Labs  01/11/14 1855  WBC 16.8*  HGB 13.5  PLT 387  CREATININE 0.87   Estimated Creatinine Clearance: 105.6 ml/min (by C-G formula based on Cr of 0.87). No results found for this basename: VANCOTROUGH, VANCOPEAK, VANCORANDOM, GENTTROUGH, GENTPEAK, GENTRANDOM, TOBRATROUGH, TOBRAPEAK, TOBRARND, AMIKACINPEAK, AMIKACINTROU, AMIKACIN,  in the last 72 hours   Microbiology: No results found for this or any previous visit (from the past 720 hour(s)).  Medical History: History reviewed. No pertinent past medical history.  Medications:  Anti-infectives   Start     Dose/Rate Route Frequency Ordered Stop   01/12/14 0600  vancomycin (VANCOCIN) IVPB 1000 mg/200 mL premix     1,000 mg 200 mL/hr over 60 Minutes Intravenous Every 8 hours 01/12/14 0324     01/11/14 2230  piperacillin-tazobactam (ZOSYN) IVPB 3.375 g     3.375 g 12.5 mL/hr over 240 Minutes Intravenous 3 times per day 01/11/14 2222     01/11/14 1930  vancomycin (VANCOCIN) IVPB 1000 mg/200 mL premix     1,000 mg 200 mL/hr over 60 Minutes Intravenous  Once 01/11/14 1917 01/11/14 2126     Assessment: Patient with Osteomyelitis.  First dose of antibiotics already given.  Goal of Therapy:  Vancomycin trough level 15-20 mcg/ml Zosyn based on renal function   Plan:  Measure antibiotic drug levels at steady state Follow up culture results Vancomycin 1gm iv q8hr Zosyn 3.375g IV Q8H infused over 4hrs.   Darlina GuysGrimsley Jr, Jacquenette ShoneJulian  Crowford 01/12/2014,3:27 AM

## 2014-01-12 NOTE — Progress Notes (Addendum)
Patient ID: Ryan [name], male   DOB: [birth year], 53 y.o.   MRN: [id number] [facility] PROGRESS NOTE  Ryan [name] [id number] DOB: [birth year] DOA: 01/11/2014 PCP: No primary provider on file.  Brief narrative: 53 y.o. male, with no significant past medical history who [facility] with 3 week history of lower extremity ulcers / toe discoloration. Imaging studies included X ray left and right foot were significant for possible osteomyelitis at the tip of the left great toe distal phalanx and osteomyelitis of the distal phalanx in the great toe with a soft tissue gas producing abscess. In addition, he was found to have new onset diabetes.  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  ABI studies done; MRI feet pending   Continue vancomycin and zosyn  Continue IV fluids, analgesia PRN  Orthopedic surgery is consulted  Active Problems:   Diabetes mellitus with complications of gangrene   A1c is pending  Appreciate DM coordinator consult  Currently taking Levemir 10 units QHS and sliding scale insulin   Hyponatremia  Likely dehydration  Continue IV fluids  Follow up BMP in am   Leukocytosis  Secondary to osteomyelitis  Management with vanco and zosyn  Ortho consulted    DVT prophylaxis: Lovenox sub Q while inpatient   Code Status: full code  Family Communication: plan of care discussed with the patient Disposition Plan: home when stable   Manson PasseyEVINE, ALMA, MD  [facility] Pager [phone]  If 7PM-7AM, please contact night-coverage [email or web address] Password [email or web address] 01/12/2014, 12:59 PM   LOS: 1 day   Consultants:  Orthopedic surgery   Other consultants:  Diabetic coordinator  Procedures:  None   Antibiotics:  Vancomycin 01/11/2014 -->  Zosyn 01/11/2014 -->  HPI/Subjective: No acute overnight events.  Objective: Filed Vitals:   01/11/14 2030 01/11/14 2100 01/11/14  2200 01/12/14 0604  BP: 159/72 179/76 163/72 129/71  Pulse: 86 93 93 72  Temp:   98.4 F (36.9 C) 99.1 F (37.3 C)  TempSrc:   Oral Oral  Resp: 9 16 16 16   Height:   5\' 11"  (1.803 m)   Weight:   74.254 kg (163 lb 11.2 oz)   SpO2: 98% 100% 100% 100%    Intake/Output Summary (Last 24 hours) at 01/12/14 1259 Last data filed at 01/12/14 0705  Gross per 24 hour  Intake    360 ml  Output      0 ml  Net    360 ml    Exam:   General:  Pt is alert, follows commands appropriately, not in acute distress  Cardiovascular: Regular rate and rhythm, S1/S2, no murmurs  Respiratory: Clear to auscultation bilaterally, no wheezing, no crackles, no rhonchi  Abdomen: Soft, non tender, non distended, bowel sounds present  Extremities: right great toe gangrenous; other toes with bluish discoloration  Neuro: Grossly nonfocal  Data Reviewed: Basic Metabolic Panel:  Recent Labs Lab 01/11/14 1855  NA 126*  K 4.1  CL 89*  CO2 23  GLUCOSE 327*  BUN 13  CREATININE 0.87  CALCIUM 8.9   Liver Function Tests:  Recent Labs Lab 01/11/14 1855  AST 14  ALT 12  ALKPHOS 68  BILITOT 0.3  PROT 8.6*  ALBUMIN 2.9*   No results found for this basename: LIPASE, AMYLASE,  in the last 168 hours No results found for this basename: AMMONIA,  in the last 168 hours CBC:  Recent Labs Lab 01/11/14 1855  WBC 16.8*  NEUTROABS 14.2*  HGB 13.5  HCT 40.6  MCV 93.1  PLT 387   Cardiac Enzymes: No results found for this basename: CKTOTAL, CKMB, CKMBINDEX, TROPONINI,  in the last 168 hours BNP: No components found with this basename: POCBNP,  CBG:  Recent Labs Lab 01/11/14 1850 01/11/14 2114  GLUCAP 294* 77    No results found for this or any previous visit (from the past 240 hour(s)).   Studies: Dg Tibia/fibula Left 01/11/2014    IMPRESSION: No acute bony abnormality.   Electronically Signed   By: Charlett NoseKevin  Dover M.D.   On: 01/11/2014 19:53   Dg Tibia/fibula Right 01/11/2014    IMPRESSION: No acute bony abnormality.   Electronically Signed   By: Charlett NoseKevin  Dover M.D.   On: 01/11/2014 19:54   Dg Chest Port 1 View 01/11/2014    IMPRESSION: No active disease.   Electronically Signed   By: Charlett NoseKevin  Dover M.D.   On: 01/11/2014 19:25   Dg Foot Complete Left 01/11/2014     IMPRESSION: Findings suspicious for osteomyelitis at the tip of the left great toe distal phalanx.   Electronically Signed   By: Charlett NoseKevin  Dover M.D.   On: 01/11/2014 19:53   Dg Foot Complete Right 01/11/2014     IMPRESSION: 1. Osteomyelitis of the distal phalanx in the great toe with a soft tissue gas producing abscess. 2. Soft tissue swelling about the distal aspect second toe with a sclerotic foreshortened distal phalanx. This may represent chronic infection.   Electronically Signed   By: Gennette Pachris  Mattern M.D.   On: 01/11/2014 19:55    Scheduled Meds: . enoxaparin (LOVENOX) injection  40 mg Subcutaneous QHS  . insulin aspart  0-15 Units Subcutaneous TID WC  . insulin aspart  0-5 Units Subcutaneous QHS  . insulin detemir  10 Units Subcutaneous QHS  . piperacillin-tazobactam (ZOSYN)  IV  3.375 g Intravenous 3 times per day  . vancomycin  1,000 mg Intravenous Q8H   Continuous Infusions: . sodium chloride    . sodium chloride 75 mL/hr at 01/11/14 2317

## 2014-01-13 ENCOUNTER — Encounter (HOSPITAL_COMMUNITY)
Admission: EM | Disposition: A | Discharge status: Home Health | Payer: Self-pay | Source: Home / Self Care | Attending: Internal Medicine

## 2014-01-13 ENCOUNTER — Encounter (HOSPITAL_COMMUNITY): Payer: MEDICAID | Admitting: Anesthesiology

## 2014-01-13 ENCOUNTER — Encounter (HOSPITAL_COMMUNITY): Payer: Self-pay | Admitting: Anesthesiology

## 2014-01-13 ENCOUNTER — Inpatient Hospital Stay (HOSPITAL_COMMUNITY): Payer: Self-pay | Admitting: Anesthesiology

## 2014-01-13 DIAGNOSIS — R7881 Bacteremia: Secondary | ICD-10-CM | POA: Diagnosis present

## 2014-01-13 DIAGNOSIS — M869 Osteomyelitis, unspecified: Secondary | ICD-10-CM | POA: Diagnosis present

## 2014-01-13 DIAGNOSIS — M86171 Other acute osteomyelitis, right ankle and foot: Secondary | ICD-10-CM | POA: Diagnosis present

## 2014-01-13 HISTORY — PX: AMPUTATION: SHX166

## 2014-01-13 LAB — BASIC METABOLIC PANEL
BUN: 7 mg/dL (ref 6–23)
CALCIUM: 8.7 mg/dL (ref 8.4–10.5)
CO2: 26 mEq/L (ref 19–32)
Chloride: 101 mEq/L (ref 96–112)
Creatinine, Ser: 0.88 mg/dL (ref 0.50–1.35)
GFR calc Af Amer: >90 mL/min (ref >90)
Glucose, Bld: 121 mg/dL — ABNORMAL HIGH (ref 70–99)
POTASSIUM: 4 meq/L (ref 3.7–5.3)
SODIUM: 137 meq/L (ref 137–147)

## 2014-01-13 LAB — CBC
HCT: 37.9 % — ABNORMAL LOW (ref 39.0–52.0)
HEMOGLOBIN: 11.8 g/dL — AB (ref 13.0–17.0)
MCH: 30.6 pg (ref 26.0–34.0)
MCHC: 31.1 g/dL (ref 30.0–36.0)
MCV: 98.2 fL (ref 78.0–100.0)
Platelets: 331 10*3/uL (ref 150–400)
RBC: 3.86 MIL/uL — ABNORMAL LOW (ref 4.22–5.81)
RDW: 13 % (ref 11.5–15.5)
WBC: 10.9 10*3/uL — AB (ref 4.0–10.5)

## 2014-01-13 LAB — SURGICAL PCR SCREEN
MRSA, PCR: NEGATIVE
STAPHYLOCOCCUS AUREUS: NEGATIVE

## 2014-01-13 LAB — GLUCOSE, CAPILLARY
GLUCOSE-CAPILLARY: 138 mg/dL — AB (ref 70–99)
Glucose-Capillary: 115 mg/dL — ABNORMAL HIGH (ref 70–99)

## 2014-01-13 LAB — VANCOMYCIN, TROUGH: VANCOMYCIN TR: 17.5 ug/mL (ref 10.0–20.0)

## 2014-01-13 SURGERY — AMPUTATION DIGIT
Anesthesia: General | Laterality: Right

## 2014-01-13 MED ORDER — MIDAZOLAM HCL 2 MG/2ML IJ SOLN
INTRAMUSCULAR | Status: AC
  Filled 2014-01-13: qty 2

## 2014-01-13 MED ORDER — FENTANYL CITRATE 0.05 MG/ML IJ SOLN: 25.0000 ug | INTRAMUSCULAR | Status: DC | PRN

## 2014-01-13 MED ORDER — LIDOCAINE HCL (CARDIAC) 10 MG/ML IV SOLN
INTRAVENOUS | Status: DC | PRN
  Administered 2014-01-13: 75 mg via INTRAVENOUS

## 2014-01-13 MED ORDER — PROMETHAZINE HCL 25 MG/ML IJ SOLN: 6.2500 mg | INTRAMUSCULAR | Status: DC | PRN

## 2014-01-13 MED ORDER — LACTATED RINGERS IV SOLN
INTRAVENOUS | Status: AC
  Administered 2014-01-13: 1000 mL via INTRAVENOUS

## 2014-01-13 MED ORDER — FENTANYL CITRATE 0.05 MG/ML IJ SOLN
INTRAMUSCULAR | Status: AC
  Filled 2014-01-13: qty 5

## 2014-01-13 MED ORDER — EPHEDRINE SULFATE 50 MG/ML IJ SOLN
INTRAMUSCULAR | Status: DC | PRN
  Administered 2014-01-13: 10 mg via INTRAVENOUS

## 2014-01-13 MED ORDER — MIDAZOLAM HCL 5 MG/5ML IJ SOLN
INTRAMUSCULAR | Status: DC | PRN
  Administered 2014-01-13: 2 mg via INTRAVENOUS

## 2014-01-13 MED ORDER — SODIUM CHLORIDE 0.9 % IR SOLN
Status: DC | PRN
  Administered 2014-01-13: 17:00:00

## 2014-01-13 MED ORDER — LIDOCAINE HCL (CARDIAC) 20 MG/ML IV SOLN
INTRAVENOUS | Status: AC
  Filled 2014-01-13: qty 5

## 2014-01-13 MED ORDER — EPHEDRINE SULFATE 50 MG/ML IJ SOLN
INTRAMUSCULAR | Status: AC
  Filled 2014-01-13: qty 1

## 2014-01-13 MED ORDER — ENOXAPARIN SODIUM 40 MG/0.4ML ~~LOC~~ SOLN
40.0000 mg | SUBCUTANEOUS | Status: DC
  Administered 2014-01-14 – 2014-01-15 (×2): 40 mg via SUBCUTANEOUS
  Filled 2014-01-13 (×3): qty 0.4

## 2014-01-13 MED ORDER — PROPOFOL 10 MG/ML IV BOLUS
INTRAVENOUS | Status: AC
  Filled 2014-01-13: qty 20

## 2014-01-13 MED ORDER — PROPOFOL 10 MG/ML IV BOLUS
INTRAVENOUS | Status: DC | PRN
  Administered 2014-01-13: 200 mg via INTRAVENOUS

## 2014-01-13 MED ORDER — BACITRACIN-NEOMYCIN-POLYMYXIN 400-5-5000 EX OINT
TOPICAL_OINTMENT | CUTANEOUS | Status: AC
  Filled 2014-01-13: qty 1

## 2014-01-13 MED ORDER — LACTATED RINGERS IV SOLN
INTRAVENOUS | Status: DC | PRN
  Administered 2014-01-13 (×2): via INTRAVENOUS

## 2014-01-13 MED ORDER — PHENYLEPHRINE 40 MCG/ML (10ML) SYRINGE FOR IV PUSH (FOR BLOOD PRESSURE SUPPORT)
PREFILLED_SYRINGE | INTRAVENOUS | Status: AC
  Filled 2014-01-13: qty 10

## 2014-01-13 MED ORDER — PHENYLEPHRINE HCL 10 MG/ML IJ SOLN
INTRAMUSCULAR | Status: DC | PRN
  Administered 2014-01-13 (×2): 80 ug via INTRAVENOUS

## 2014-01-13 MED ORDER — ONDANSETRON HCL 4 MG/2ML IJ SOLN
INTRAMUSCULAR | Status: DC | PRN
  Administered 2014-01-13: 4 mg via INTRAVENOUS

## 2014-01-13 MED ORDER — ONDANSETRON HCL 4 MG/2ML IJ SOLN
INTRAMUSCULAR | Status: AC
  Filled 2014-01-13: qty 2

## 2014-01-13 MED ORDER — FENTANYL CITRATE 0.05 MG/ML IJ SOLN
INTRAMUSCULAR | Status: DC | PRN
  Administered 2014-01-13: 100 ug via INTRAVENOUS

## 2014-01-13 MED ORDER — FERROUS SULFATE 325 (65 FE) MG PO TABS
325.0000 mg | ORAL_TABLET | Freq: Three times a day (TID) | ORAL | Status: DC
  Administered 2014-01-13 – 2014-01-15 (×7): 325 mg via ORAL
  Filled 2014-01-13 (×8): qty 1

## 2014-01-13 SURGICAL SUPPLY — 36 items
BAG SPEC THK2 15X12 ZIP CLS (MISCELLANEOUS)
BAG ZIPLOCK 12X15 (MISCELLANEOUS) ×1 IMPLANT
BANDAGE GAUZE ELAST BULKY 4 IN (GAUZE/BANDAGES/DRESSINGS) ×1 IMPLANT
BNDG ADH 5X4 AIR PERM ELC (GAUZE/BANDAGES/DRESSINGS) ×1
BNDG COHESIVE 4X5 WHT NS (GAUZE/BANDAGES/DRESSINGS) ×1 IMPLANT
BNDG COHESIVE 6X5 TAN STRL LF (GAUZE/BANDAGES/DRESSINGS) ×2 IMPLANT
BNDG GAUZE ELAST 4 BULKY (GAUZE/BANDAGES/DRESSINGS) ×1 IMPLANT
DRAIN PENROSE 18X1/2 LTX STRL (DRAIN) ×1 IMPLANT
DRAIN PENROSE 18X1/4 LTX STRL (WOUND CARE) ×1 IMPLANT
DRSG EMULSION OIL 3X16 NADH (GAUZE/BANDAGES/DRESSINGS) ×2 IMPLANT
DRSG PAD ABDOMINAL 8X10 ST (GAUZE/BANDAGES/DRESSINGS) ×1 IMPLANT
ELECT REM PT RETURN 9FT ADLT (ELECTROSURGICAL) ×2
ELECTRODE REM PT RTRN 9FT ADLT (ELECTROSURGICAL) ×1 IMPLANT
GAUZE SPONGE 4X4 12PLY STRL (GAUZE/BANDAGES/DRESSINGS) ×2 IMPLANT
GLOVE BIOGEL PI IND STRL 8 (GLOVE) ×1 IMPLANT
GLOVE BIOGEL PI INDICATOR 8 (GLOVE) ×1
GLOVE ECLIPSE 8.0 STRL XLNG CF (GLOVE) ×4 IMPLANT
GOWN STRL REUS W/TWL LRG LVL3 (GOWN DISPOSABLE) ×6 IMPLANT
GOWN STRL REUS W/TWL XL LVL3 (GOWN DISPOSABLE) ×2 IMPLANT
MANIFOLD NEPTUNE II (INSTRUMENTS) ×2 IMPLANT
PACK LOWER EXTREMITY WL (CUSTOM PROCEDURE TRAY) ×2 IMPLANT
PAD ABD 8X10 STRL (GAUZE/BANDAGES/DRESSINGS) ×1 IMPLANT
POSITIONER SURGICAL ARM (MISCELLANEOUS) ×2 IMPLANT
SPONGE GAUZE 4X4 12PLY (GAUZE/BANDAGES/DRESSINGS) ×1 IMPLANT
SPONGE LAP 18X18 X RAY DECT (DISPOSABLE) ×2 IMPLANT
STOCKINETTE 8 INCH (MISCELLANEOUS) ×2 IMPLANT
SUT ETHILON 1 TP 1 60 (SUTURE) ×1 IMPLANT
SUT ETHILON 2 0 PS N (SUTURE) ×2 IMPLANT
SUT ETHILON 3 0 PS 1 (SUTURE) ×4 IMPLANT
SUT SILK 2 0 (SUTURE)
SUT SILK 2 0 SH CR/8 (SUTURE) ×1 IMPLANT
SUT SILK 2-0 18XBRD TIE 12 (SUTURE) ×1 IMPLANT
SUT VIC AB 1 CT1 36 (SUTURE) ×2 IMPLANT
SUT VIC AB 2-0 CT1 27 (SUTURE)
SUT VIC AB 2-0 CT1 27XBRD (SUTURE) ×2 IMPLANT
TOWEL OR 17X26 10 PK STRL BLUE (TOWEL DISPOSABLE) ×3 IMPLANT

## 2014-01-13 NOTE — Interval H&P Note (Signed)
History and Physical Interval Note:  01/13/2014 3:10 PM  Ryan [name]  has presented today for surgery, with the diagnosis of right great toe ulcer  The various methods of treatment have been discussed with the patient and family. After consideration of risks, benefits and other options for treatment, the patient has consented to  Procedure(s): Right great toe amputation (Right) as a surgical intervention .  The patient's history has been reviewed, patient examined, no change in status, stable for surgery.  I have reviewed the patient's chart and labs.  Questions were answered to the patient's satisfaction.     GIOFFRE,Ahmadou A

## 2014-01-13 NOTE — Progress Notes (Signed)
Inpatient Diabetes Program Recommendations  AACE/ADA: New Consensus Statement on Inpatient Glycemic Control (2013)  Target Ranges:  Prepandial:   less than 140 mg/dL      Peak postprandial:   less than 180 mg/dL (1-2 hours)      Critically ill patients:  140 - 180 mg/dL   Note: Follow-up visit with patient, but he is in OR.  Left instructional DVD for home and info regarding generic glucose meter from [facility] [employer] with his nurse to give to him when he returns. Thank you.  Patti S. Elsie Lincolnouth, RN, CNS, CDE Inpatient Diabetes Program, team pager [phone]

## 2014-01-13 NOTE — Progress Notes (Signed)
Patient ID: Ryan [name], male   DOB: 1961/01/17, 53 y.o.   MRN: [id number] [facility] PROGRESS NOTE  Ryan PellantRonald R [name] [id number] DOB: 1961/01/17 DOA: 01/11/2014 PCP: No primary provider on file.  Brief narrative: 53 y.o. male, with no significant past medical history who [facility] with 3 week history of lower extremity ulcers / toe discoloration. Imaging studies included X ray left and right foot were significant for possible osteomyelitis at the tip of the left great toe distal phalanx and osteomyelitis of the distal phalanx in the great toe with a soft tissue gas producing abscess. 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 MRI of the left foot showed findings most concerning for osteomyelitis of the tip of the first distal phalanx of the left foot. Hospital course is complicated due to additional finding of gram-positive cocci bacteremia as well as new onset diabetes.  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 Orthopedic surgery was consulted. Awaiting for an official consultation. ABI studies done. Patient is on vancomycin and Zosyn. Patient is n.p.o. for possible surgery. We will continue IV fluids, analgesia as needed.  Active Problems:  Gram-positive cocci bacteremia  Concerning for a MRSA infection. Patient is already on vancomycin. We will repeat blood cultures to ensure clearing of bacteremia. Diabetes mellitus with complications of gangrene  A1c is 7.6 on this admission indicating diagnosis of diabetes. Patient has been seen by diabetic coordinator and recommendation was to start liver 10 units at bedtime along with sliding scale insulin. CBG's in past 24 hours: 294, 77 Hyponatremia  Likely  dehydration. Has resolved with IV fluids. Leukocytosis  Secondary to osteomyelitis, gram-positive cocci bacteremia Management with vanco and zosyn  Ortho consulted    DVT prophylaxis: Lovenox sub Q while inpatient    Code Status: full code  Family Communication: plan of care discussed with the patient  Disposition Plan: home when stable   Consultants:  Orthopedic surgery  Other consultants:  Diabetic coordinator Procedures:  None  Antibiotics:  Vancomycin 01/11/2014 -->  Zosyn 01/11/2014 -->   Manson PasseyEVINE, ALMA, MD  [facility] Pager [phone]  If 7PM-7AM, please contact night-coverage [email or web address] Password [email or web address] 01/13/2014, 9:45 AM   LOS: 2 days    HPI/Subjective: No acute overnight events.  Objective: Filed Vitals:   01/12/14 0604 01/12/14 1343 01/12/14 2159 01/13/14 0625  BP: 129/71 111/65 158/97 143/69  Pulse: 72 68 70 88  Temp: 99.1 F (37.3 C) 98.7 F (37.1 C) 98.4 F (36.9 C) 98 F (36.7 C)  TempSrc: Oral Oral Oral Oral  Resp: 16 16 16 16   Height:      Weight:      SpO2: 100% 100% 100% 100%    Intake/Output Summary (Last 24 hours) at 01/13/14 0945 Last data filed at 01/13/14 [id number]  Gross per 24 hour  Intake 4246.25 ml  Output      0 ml  Net 4246.25 ml    Exam:  General: Pt is sleeping this am, no distress Cardiovascular: Regular rate and rhythm, S1/S2 appreciated  Respiratory: bilateral air entry, no wheezing  Abdomen: Soft, non tender, non distended, bowel sounds present  Extremities: right great toe gangrenous; other toes with bluish discoloration Neuro: No focal neurological deficits   Data Reviewed: Basic Metabolic  Panel:  Recent Labs Lab 01/11/14 1855 01/13/14 0350  NA 126* 137  K 4.1 4.0  CL 89* 101  CO2 23 26  GLUCOSE 327* 121*  BUN 13 7  CREATININE 0.87 0.88  CALCIUM 8.9 8.7   Liver Function Tests:  Recent Labs Lab 01/11/14 1855  AST 14  ALT 12  ALKPHOS 68  BILITOT 0.3  PROT 8.6*  ALBUMIN 2.9*   No  results found for this basename: LIPASE, AMYLASE,  in the last 168 hours No results found for this basename: AMMONIA,  in the last 168 hours CBC:  Recent Labs Lab 01/11/14 1855  WBC 16.8*  NEUTROABS 14.2*  HGB 13.5  HCT 40.6  MCV 93.1  PLT 387   Cardiac Enzymes: No results found for this basename: CKTOTAL, CKMB, CKMBINDEX, TROPONINI,  in the last 168 hours BNP: No components found with this basename: POCBNP,  CBG:  Recent Labs Lab 01/11/14 1850 01/11/14 2114  GLUCAP 294* 77    CULTURE, BLOOD (ROUTINE X 2)     Status: None   Collection Time    01/11/14  6:55 PM      Result Value Ref Range Status   Specimen Description BLOOD LEFT FOREARM  3 ML IN [facility] BOTTLE   Final   Value: GRAM POSITIVE COCCI IN CLUSTERS     Note: Gram Stain Report Called to,Read Back By and Verified With: MEREDITH AT 2053 01/12/14 BY SNOLO     Performed at [facility]   Report Status PENDING   Incomplete  CULTURE, BLOOD (ROUTINE X 2)     Status: None   Collection Time    01/11/14  7:00 PM      Result Value Ref Range Status   Value:        BLOOD CULTURE RECEIVED NO GROWTH TO DATE CULTURE WILL BE HELD FOR 5 DAYS BEFORE ISSUING A FINAL NEGATIVE REPORT     Performed at [facility]   Report Status PENDING   Incomplete     Studies: Dg Tibia/fibula Left 01/11/2014   CLINICAL DATA:  Painful, bilateral painful feet and lower legs. Redness, swelling.  EXAM: LEFT TIBIA AND FIBULA - 2 VIEW  COMPARISON:  None.  FINDINGS: No acute bony abnormality. Specifically, no fracture, subluxation, or dislocation. Soft tissues are intact. 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 No knee joint effusion.  IMPRESSION: No acute bony abnormality.   Electronically Signed   By: Charlett Nose M.D.   On: 01/11/2014 19:53   Dg Tibia/fibula Right 01/11/2014   CLINICAL DATA:  Bilateral lower extremity redness, swelling, pain.  EXAM: RIGHT TIBIA AND FIBULA - 2 VIEW  COMPARISON:  None.  FINDINGS: Mild to moderate degenerative changes  within the right knee with joint space narrowing and spurring. No acute bony abnormality. Specifically, no fracture, subluxation, or dislocation. Soft tissues are intact.  IMPRESSION: No acute bony abnormality.   Electronically Signed   By: Charlett Nose M.D.   On: 01/11/2014 19:54   Mr Foot Right W Wo Contrast 01/13/2014  IMPRESSION: Extensive cellulitis about the foot without abscess.  Abnormal appearance of the distal phalanx of the great toe is worrisome for osteonecrosis.  Mild edema and enhancement in the base of the proximal phalanx of the great toe is likely reactive to cellulitis rather than due to osteomyelitis.  Osteolysis of the tuft of the distal phalanx of the second toe is identified. Small focus of marrow signal abnormality in the middle phalanx is worrisome for osteomyelitis.  Small  first MTP joint effusion. Given normal signal in the head of the first metatarsal, septic joint is unlikely.    Mr Foot Left W/Wo Contrast 01/13/2014    IMPRESSION: 1. Findings most concerning for osteomyelitis of the tip of the first distal phalanx of the left foot with surrounding cellulitis.    Dg Chest Port 1 View 01/11/2014   CLINICAL DATA:  Bilateral lower extremity infections.  EXAM: PORTABLE CHEST - 1 VIEW  COMPARISON:  None.  FINDINGS: The heart size and mediastinal contours are within normal limits. Both lungs are clear. The visualized skeletal structures are unremarkable.  IMPRESSION: No active disease.   Electronically Signed   By: Charlett NoseKevin  Dover M.D.   On: 01/11/2014 19:25   Dg Foot Complete Left 01/11/2014   CLINICAL DATA:  Bilateral foot pain, redness, swelling.  EXAM: LEFT FOOT - COMPLETE 3+ VIEW  COMPARISON:  None.  FINDINGS: There is irregular lucency at the tip of the left great toe distal phalanx concerning for osteomyelitis. There appears to be a overlying soft tissue defect at the tip of the great toe. Recommend clinical correlation.  No additional acute bony abnormality. No fracture,  subluxation or dislocation.  IMPRESSION: Findings suspicious for osteomyelitis at the tip of the left great toe distal phalanx.   Electronically Signed   By: Charlett NoseKevin  Dover M.D.   On: 01/11/2014 19:53   Dg Foot Complete Right 01/11/2014     IMPRESSION: 1. Osteomyelitis of the distal phalanx in the great toe with a soft tissue gas producing abscess. 2. Soft tissue swelling about the distal aspect second toe with a sclerotic foreshortened distal phalanx. This may represent chronic infection.      Scheduled Meds: . enoxaparin (LOVENOX) injection  40 mg Subcutaneous QHS  . insulin aspart  0-15 Units Subcutaneous TID WC  . insulin aspart  0-5 Units Subcutaneous QHS  . insulin detemir  10 Units Subcutaneous QHS  . piperacillin-tazobactam (ZOSYN)  IV  3.375 g Intravenous 3 times per day  . vancomycin  1,000 mg Intravenous Q8H   Continuous Infusions: . sodium chloride 75 mL/hr at 01/13/14 [phone]

## 2014-01-13 NOTE — Transfer of Care (Signed)
Immediate Anesthesia Transfer of Care Note  Patient: Ryan PellantRonald R Dabbs  Procedure(s) Performed: Procedure(s): Right great toe amputation (Right)  Patient Location: PACU  Anesthesia Type:General  Level of Consciousness: awake, alert , oriented and patient cooperative  Airway & Oxygen Therapy: Patient Spontanous Breathing and Patient connected to face mask oxygen  Post-op Assessment: Report given to PACU RN, Post -op Vital signs reviewed and stable and Patient moving all extremities X 4  Post vital signs: stable  Complications: No apparent anesthesia complications

## 2014-01-13 NOTE — Progress Notes (Signed)
ANTIBIOTIC CONSULT NOTE - follow up  Pharmacy Consult for Vancomycin and Zosyn  Indication: Osteomyelitis   No Known Allergies  Patient Measurements: Height: 5\' 11"  (180.3 cm) Weight: 163 lb 11.2 oz (74.254 kg) IBW/kg (Calculated) : 75.3 Adjusted Body Weight:   Vital Signs: Temp: 98 F (36.7 C) (06/16 0625) Temp src: Oral (06/16 0625) BP: 143/69 mmHg (06/16 0625) Pulse Rate: 88 (06/16 0625) Intake/Output from previous day: 06/15 0701 - 06/16 0700 In: 4486.3 [P.O.:1080; I.V.:2356.3; IV Piggyback:1050] Out: -  Intake/Output from this shift:    Labs:  Recent Labs  01/11/14 1855 01/13/14 0350  WBC 16.8*  --   HGB 13.5  --   PLT 387  --   CREATININE 0.87 0.88   Estimated Creatinine Clearance: 104.4 ml/min (by C-G formula based on Cr of 0.88).  Recent Labs  01/13/14 1313  VANCOTROUGH 17.5     Microbiology: Recent Results (from the past 720 hour(s))  CULTURE, BLOOD (ROUTINE X 2)     Status: None   Collection Time    01/11/14  6:55 PM      Result Value Ref Range Status   Specimen Description BLOOD LEFT FOREARM  [facility]   Final   Special Requests NONE   Final   Culture  Setup Time     Final   Value: 01/12/2014 00:45     [facility]   Culture     Final   Value: GRAM POSITIVE COCCI IN CLUSTERS     Note: Gram Stain Report Called to,Read Back By and Verified With: MEREDITH AT 2053 01/12/14 BY SNOLO     [facility] at [facility]   Report Status PENDING   Incomplete  CULTURE, BLOOD (ROUTINE X 2)     Status: None   Collection Time    01/11/14  7:00 PM      Result Value Ref Range Status   Specimen Description BLOOD RIGHT FOREARM  4 [facility]   Final   Special Requests NONE   Final   Culture  Setup Time     Final   Value: 01/12/2014 00:45     [facility]formed at [facility]   Culture     Final   Value:        BLOOD CULTURE RECEIVED NO GROWTH TO DATE CULTURE WILL BE HELD FOR 5 DAYS BEFORE ISSUING A FINAL NEGATIVE  REPORT     [facility] at [facility]   Report Status PENDING   Incomplete  SURGICAL PCR SCREEN     Status: None   Collection Time    01/13/14 10:14 AM      Result Value Ref Range Status   MRSA, PCR NEGATIVE  NEGATIVE Final   Staphylococcus aureus NEGATIVE  NEGATIVE Final   Comment:            The Xpert SA Assay (FDA     approved for NASAL specimens     in patients over 51 years of age),     is one component of     a comprehensive surveillance     program.  Test performance has     been validated by [facility] for patients greater     than or equal to 51 year old.     It is not intended     to diagnose infection nor to     guide or monitor treatment.    Medical History:  History reviewed. No pertinent past medical history.  Medications:  Anti-infectives   Start     Dose/Rate Route Frequency Ordered Stop   01/12/14 0600  vancomycin (VANCOCIN) IVPB 1000 mg/200 mL premix     1,000 mg 200 mL/hr over 60 Minutes Intravenous Every 8 hours 01/12/14 0324     01/11/14 2230  piperacillin-tazobactam (ZOSYN) IVPB 3.375 g     3.375 g 12.5 mL/hr over 240 Minutes Intravenous 3 times per day 01/11/14 2222     01/11/14 1930  vancomycin (VANCOCIN) IVPB 1000 mg/200 mL premix     1,000 mg 200 mL/hr over 60 Minutes Intravenous  Once 01/11/14 1917 01/11/14 2126     Assessment: 53 yo with recent diagnosis of DM presented to ER with lower extremity ulcers 6/14 that had been progressing quickly. To treat with broad spectrum abx's for right first toe gangrene with osteomyelitis, chronic infection of second right toe, left foot diabetic ulcers with ?osteomyelitis of left first toe. Note patient also with recent exposure to MRSA  6/15 >> Zosyn >> 6/15 >> Vanc >>  Tmax: afebrile WBC: 16.8 on 6/14 Renal: Scr 0.88, stable. CrCl 104 Vanc Trough: therapeutic  Goal of Therapy:  Vancomycin trough level 15-20 mcg/ml Zosyn based on renal function   Plan:  1) Continue vancomycin 1g  q8 2) Continue current Zosyn dosing   Hessie KnowsJustin M Legge, PharmD, BCPS Pager [phone] 01/13/2014 2:37 PM

## 2014-01-13 NOTE — H&P (Signed)
Ryan [name] is an 53 y.o. male.   Chief Complaint: Gangrene of Right Great Toe HPI: Progressive infection of both feet,right greater than Left.His Right Great Toe became gangrenous since memorial Day. He is a Diabetic.  History reviewed. No pertinent past medical history.  History reviewed. No pertinent past surgical history.  History reviewed. No pertinent family history. Social History:  reports that he has never smoked. He does not have any smokeless tobacco history on file. He reports that he drinks alcohol. He reports that he does not use illicit drugs.  Allergies: No Known Allergies  Medications Prior to Admission  Medication Sig Dispense Refill  . Cyanocobalamin (B-12 PO) Take 1 tablet by mouth daily.      Marland Kitchen UNABLE TO FIND Apply 1 application topically daily. Silver shield gel        Results for orders placed during the hospital encounter of 01/11/14 (from the past 48 hour(s))  CBG MONITORING, ED     Status: Abnormal   Collection Time    01/11/14  6:50 PM      Result Value Ref Range   Glucose-Capillary 294 (*) 70 - 99 mg/dL   Comment 1 Documented in Chart     Comment 2 Notify RN    CBC WITH DIFFERENTIAL     Status: Abnormal   Collection Time    01/11/14  6:55 PM      Result Value Ref Range   WBC 16.8 (*) 4.0 - 10.5 K/uL   RBC 4.36  4.22 - 5.81 MIL/uL   Hemoglobin 13.5  13.0 - 17.0 g/dL   HCT 40.6  39.0 - 52.0 %   MCV 93.1  78.0 - 100.0 fL   MCH 31.0  26.0 - 34.0 pg   MCHC 33.3  30.0 - 36.0 g/dL   RDW 12.5  11.5 - 15.5 %   Platelets 387  150 - 400 K/uL   Neutrophils Relative % 86 (*) 43 - 77 %   Neutro Abs 14.2 (*) 1.7 - 7.7 K/uL   Lymphocytes Relative 7 (*) 12 - 46 %   Lymphs Abs 1.2  0.7 - 4.0 K/uL   Monocytes Relative 6  3 - 12 %   Monocytes Absolute 1.1 (*) 0.1 - 1.0 K/uL   Eosinophils Relative 1  0 - 5 %   Eosinophils Absolute 0.2  0.0 - 0.7 K/uL   Basophils Relative 0  0 - 1 %   Basophils Absolute 0.0  0.0 - 0.1 K/uL  COMPREHENSIVE METABOLIC PANEL      Status: Abnormal   Collection Time    01/11/14  6:55 PM      Result Value Ref Range   Sodium 126 (*) 137 - 147 mEq/L   Potassium 4.1  3.7 - 5.3 mEq/L   Chloride 89 (*) 96 - 112 mEq/L   CO2 23  19 - 32 mEq/L   Glucose, Bld 327 (*) 70 - 99 mg/dL   BUN 13  6 - 23 mg/dL   Creatinine, Ser 0.87  0.50 - 1.35 mg/dL   Calcium 8.9  8.4 - 10.5 mg/dL   Total Protein 8.6 (*) 6.0 - 8.3 g/dL   Albumin 2.9 (*) 3.5 - 5.2 g/dL   AST 14  0 - 37 U/L   ALT 12  0 - 53 U/L   Alkaline Phosphatase 68  39 - 117 U/L   Total Bilirubin 0.3  0.3 - 1.2 mg/dL   GFR calc non Af Amer >90  >90  mL/min   GFR calc Af Amer >90  >90 mL/min   Comment: (NOTE)     The eGFR has been calculated using the CKD EPI equation.     This calculation has not been validated in all clinical situations.     eGFR's persistently <90 mL/min signify possible Chronic Kidney     Disease.  PRO B NATRIURETIC PEPTIDE     Status: Abnormal   Collection Time    01/11/14  6:55 PM      Result Value Ref Range   Pro B Natriuretic peptide (BNP) 148.5 (*) 0 - 125 pg/mL  CULTURE, BLOOD (ROUTINE X 2)     Status: None   Collection Time    01/11/14  6:55 PM      Result Value Ref Range   Specimen Description BLOOD LEFT FOREARM  3 ML IN [facility] BOTTLE     Special Requests NONE     Culture  Setup Time       Value: 01/12/2014 00:45     Performed at [facility]   Culture       Value: [place] IN CLUSTERS     Note: Gram Stain Report Called to,Read Back By and Verified With: MEREDITH AT 2053 01/12/14 BY SNOLO     Performed at [facility]   Report Status PENDING    CULTURE, BLOOD (ROUTINE X 2)     Status: None   Collection Time    01/11/14  7:00 PM      Result Value Ref Range   Specimen Description BLOOD RIGHT FOREARM  4 ML IN [facility] BOTTLE     Special Requests NONE     Culture  Setup Time       Value: 01/12/2014 00:45     Performed at [facility]   Culture       Value:        BLOOD CULTURE RECEIVED NO GROWTH TO  DATE CULTURE WILL BE HELD FOR 5 DAYS BEFORE ISSUING A FINAL NEGATIVE REPORT     Performed at [facility]   Report Status PENDING    I-STAT CG4 LACTIC ACID, ED     Status: None   Collection Time    01/11/14  7:06 PM      Result Value Ref Range   Lactic Acid, Venous 1.58  0.5 - 2.2 mmol/L  I-STAT TROPOININ, ED     Status: None   Collection Time    01/11/14  7:11 PM      Result Value Ref Range   Troponin i, poc 0.00  0.00 - 0.08 ng/mL   Comment 3            Comment: Due to the release kinetics of cTnI,     a negative result within the first hours     of the onset of symptoms does not rule out     myocardial infarction with certainty.     If myocardial infarction is still suspected,     repeat the test at appropriate intervals.  CBG MONITORING, ED     Status: None   Collection Time    01/11/14  9:14 PM      Result Value Ref Range   Glucose-Capillary 77  70 - 99 mg/dL   Comment 1 Notify RN    URINALYSIS, ROUTINE W REFLEX MICROSCOPIC     Status: Abnormal   Collection Time    01/12/14 12:29 AM      Result  Value Ref Range   Color, Urine YELLOW  YELLOW   APPearance CLEAR  CLEAR   Specific Gravity, Urine 1.019  1.005 - 1.030   pH 6.0  5.0 - 8.0   Glucose, UA 250 (*) NEGATIVE mg/dL   Hgb urine dipstick TRACE (*) NEGATIVE   Bilirubin Urine NEGATIVE  NEGATIVE   Ketones, ur NEGATIVE  NEGATIVE mg/dL   Protein, ur NEGATIVE  NEGATIVE mg/dL   Urobilinogen, UA 1.0  0.0 - 1.0 mg/dL   Nitrite NEGATIVE  NEGATIVE   Leukocytes, UA NEGATIVE  NEGATIVE  URINE MICROSCOPIC-ADD ON     Status: None   Collection Time    01/12/14 12:29 AM      Result Value Ref Range   Squamous Epithelial / LPF RARE  RARE   RBC / HPF 0-2  <3 RBC/hpf   Urine-Other MUCOUS PRESENT    HEMOGLOBIN A1C     Status: Abnormal   Collection Time    01/12/14  4:10 AM      Result Value Ref Range   Hemoglobin A1C 7.6 (*) <5.7 %   Comment: (NOTE)                                                                                According to the ADA Clinical Practice Recommendations for 2011, when     HbA1c is used as a screening test:      >=6.5%   Diagnostic of Diabetes Mellitus               (if abnormal result is confirmed)     5.7-6.4%   Increased risk of developing Diabetes Mellitus     References:Diagnosis and Classification of Diabetes Mellitus,Diabetes     VZDG,3875,64(PPIRJ 1):S62-S69 and Standards of Medical Care in             Diabetes - 2011,Diabetes Care,2011,34 (Suppl 1):S11-S61.   Mean Plasma Glucose 171 (*) <117 mg/dL   Comment: Performed at [place]     Status: Abnormal   Collection Time    01/12/14  4:10 AM      Result Value Ref Range   Sed Rate 88 (*) 0 - 16 mm/hr  BASIC METABOLIC PANEL     Status: Abnormal   Collection Time    01/13/14  3:50 AM      Result Value Ref Range   Sodium 137  137 - 147 mEq/L   Comment: DELTA CHECK NOTED     REPEATED TO VERIFY   Potassium 4.0  3.7 - 5.3 mEq/L   Chloride 101  96 - 112 mEq/L   Comment: DELTA CHECK NOTED     REPEATED TO VERIFY   CO2 26  19 - 32 mEq/L   Glucose, Bld 121 (*) 70 - 99 mg/dL   BUN 7  6 - 23 mg/dL   Creatinine, Ser 0.88  0.50 - 1.35 mg/dL   Calcium 8.7  8.4 - 10.5 mg/dL   GFR calc non Af Amer >90  >90 mL/min   GFR calc Af Amer >90  >90 mL/min   Comment: (NOTE)     The eGFR has been calculated using the CKD EPI equation.  This calculation has not been validated in all clinical situations.     eGFR's persistently <90 mL/min signify possible Chronic Kidney     Disease.  SURGICAL PCR SCREEN     Status: None   Collection Time    01/13/14 10:14 AM      Result Value Ref Range   MRSA, PCR NEGATIVE  NEGATIVE   Staphylococcus aureus NEGATIVE  NEGATIVE   Comment:            The Xpert SA Assay (FDA     approved for NASAL specimens     in patients over 41 years of age),     is one component of     a comprehensive surveillance     program.  Test performance has     been validated by [employer] for patients greater     than or equal to 53 year old.     It is not intended     to diagnose infection nor to     guide or monitor treatment.  VANCOMYCIN, TROUGH     Status: None   Collection Time    01/13/14  1:13 PM      Result Value Ref Range   Vancomycin Tr 17.5  10.0 - 20.0 ug/mL   Dg Tibia/fibula Left  01/11/2014   CLINICAL DATA:  Painful, bilateral painful feet and lower legs. Redness, swelling.  EXAM: LEFT TIBIA AND FIBULA - 2 VIEW  COMPARISON:  None.  FINDINGS: No acute bony abnormality. Specifically, no fracture, subluxation, or dislocation. Soft tissues are intact. 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 No knee joint effusion.  IMPRESSION: No acute bony abnormality.   Electronically Signed   By: Rolm Baptise M.D.   On: 01/11/2014 19:53   Dg Tibia/fibula Right  01/11/2014   CLINICAL DATA:  Bilateral lower extremity redness, swelling, pain.  EXAM: RIGHT TIBIA AND FIBULA - 2 VIEW  COMPARISON:  None.  FINDINGS: Mild to moderate degenerative changes within the right knee with joint space narrowing and spurring. No acute bony abnormality. Specifically, no fracture, subluxation, or dislocation. Soft tissues are intact.  IMPRESSION: No acute bony abnormality.   Electronically Signed   By: Rolm Baptise M.D.   On: 01/11/2014 19:54   Mr Foot Right W Wo Contrast  01/13/2014   CLINICAL DATA:  Foot ulcerations for 3 weeks. Discoloration of the right great toe.  EXAM: MRI OF THE RIGHT FOREFOOT WITHOUT AND WITH CONTRAST  TECHNIQUE: Multiplanar, multisequence MR imaging was performed both before and after administration of intravenous contrast.  CONTRAST:  15 cc MultiHance IV.  COMPARISON:  Plain films of right foot 01/11/2014.  FINDINGS: Extensive soft tissue edema and enhancement are present about the foot consistent with cellulitis. Intrinsic musculature also demonstrates edema and postcontrast enhancement which could be due to myositis or denervation atrophy.  The patient has a small first MTP joint  effusion. Mild marrow edema and enhancement are seen in the proximal 1 cm of the base of the proximal phalanx of the great toe. Minimal marrow edema and enhancement in the plantar aspect of the first metatarsal head subjacent to the lateral sesamoid is likely related to degenerative disease.  The distal phalanx of the great toe demonstrates areas of decreased signal on T1 weighted imaging but no postcontrast enhancement worrisome for osteo necrosis. There is poor signal in the distal phalanx. There appears to osteolysis of the left of the distal phalanx of the second toe. A small area  of decreased T1 signal is seen in the medial aspect of the middle phalanx.  IMPRESSION: Extensive cellulitis about the foot without abscess.  Abnormal appearance of the distal phalanx of the great toe is worrisome for osteonecrosis.  Mild edema and enhancement in the base of the proximal phalanx of the great toe is likely reactive to cellulitis rather than due to osteomyelitis.  Osteolysis of the tuft of the distal phalanx of the second toe is identified. Small focus of marrow signal abnormality in the middle phalanx is worrisome for osteomyelitis.  Small first MTP joint effusion. Given normal signal in the head of the first metatarsal, septic joint is unlikely.   Electronically Signed   By: Inge Rise M.D.   On: 01/13/2014 08:36   Mr Foot Left W Wo Contrast  01/13/2014   CLINICAL DATA:  Osteomyelitis, lower extremity ulceration toe discoloration. Possible osteomyelitis on x-ray at the tip of the left great toe.  EXAM: MRI OF THE LEFT FOREFOOT WITHOUT AND WITH CONTRAST  TECHNIQUE: Multiplanar, multisequence MR imaging was performed both before and after administration of intravenous contrast.  CONTRAST:  63m MULTIHANCE GADOBENATE DIMEGLUMINE 529 MG/ML IV SOLN  COMPARISON:  None.  FINDINGS: There is cortical irregularity with marrow edema and enhancement on postcontrast images of the first distal phalanx. There is surrounding  soft tissue edema with enhancement of the first distal phalanx.  There is no other focal marrow signal abnormality. There is no fracture or dislocation. There is no fluid collection or hematoma. The visualized flexor and extensor tendons are intact. The visualized musculature is normal in signal.  IMPRESSION: 1. Findings most concerning for osteomyelitis of the tip of the first distal phalanx of the left foot with surrounding cellulitis.   Electronically Signed   By: HKathreen Devoid  On: 01/13/2014 08:30   Dg Chest Port 1 View  01/11/2014   CLINICAL DATA:  Bilateral lower extremity infections.  EXAM: PORTABLE CHEST - 1 VIEW  COMPARISON:  None.  FINDINGS: The heart size and mediastinal contours are within normal limits. Both lungs are clear. The visualized skeletal structures are unremarkable.  IMPRESSION: No active disease.   Electronically Signed   By: KRolm BaptiseM.D.   On: 01/11/2014 19:25   Dg Foot Complete Left  01/11/2014   CLINICAL DATA:  Bilateral foot pain, redness, swelling.  EXAM: LEFT FOOT - COMPLETE 3+ VIEW  COMPARISON:  None.  FINDINGS: There is irregular lucency at the tip of the left great toe distal phalanx concerning for osteomyelitis. There appears to be a overlying soft tissue defect at the tip of the great toe. Recommend clinical correlation.  No additional acute bony abnormality. No fracture, subluxation or dislocation.  IMPRESSION: Findings suspicious for osteomyelitis at the tip of the left great toe distal phalanx.   Electronically Signed   By: KRolm BaptiseM.D.   On: 01/11/2014 19:53   Dg Foot Complete Right  01/11/2014   CLINICAL DATA:  Infection.  EXAM: RIGHT FOOT COMPLETE - 3+ VIEW  COMPARISON:  Ankle films of the same day.  FINDINGS: Extensive gas surrounds the distal phalanx in the right great toe. There is marked osteopenia and erosion of the tuft. There is mild osteopenia in the distal aspect of the proximal phalanx is well. The joint is located.  Extensive soft tissue  swelling is noted in the distal second toe is well with a sclerotic foreshortened distal phalanx. No other focal bone changes are evident. The proximal foot is unremarkable.  IMPRESSION: 1. Osteomyelitis of the distal phalanx in the great toe with a soft tissue gas producing abscess. 2. Soft tissue swelling about the distal aspect second toe with a sclerotic foreshortened distal phalanx. This may represent chronic infection.   Electronically Signed   By: Lawrence Santiago M.D.   On: 01/11/2014 19:55    Review of Systems  Constitutional: Negative.   HENT: Negative.   Eyes: Negative.   Respiratory: Negative.   Cardiovascular: Negative.   Gastrointestinal: Negative.   Genitourinary: Negative.   Musculoskeletal: Positive for joint pain.  Skin:       Dry necrotic Great Toe on the right  Neurological: Negative.   Endo/Heme/Allergies: Negative.   Psychiatric/Behavioral: Negative.     Blood pressure 143/69, pulse 88, temperature 98 F (36.7 C), temperature source Oral, resp. rate 16, height _0  (1.803 m), weight 74.254 kg (163 lb 11.2 oz), SpO2 100.00%. Physical Exam  Constitutional: He appears well-developed.  HENT:  Head: Normocephalic.  Eyes: Pupils are equal, round, and reactive to light.  Neck: Normal range of motion.  Cardiovascular: Normal rate.   Respiratory: Effort normal.  GI: Soft.  Musculoskeletal: He exhibits edema.  Necrotic great Toe on the right  Neurological: He is alert.  Skin: There is erythema.  Dry Necrosis of Right Great Toe     Assessment/Plan Amputation of Right Great Toe and possible Partial resection of First Metatarsal. I explained to the patient that this was a very serious problem and this could result in an amputation of his foot. He also could develop a similar problem on the opposite foot.  GIOFFRE,Saud A 01/13/2014, 2:56 PM

## 2014-01-13 NOTE — Anesthesia Postprocedure Evaluation (Signed)
  Anesthesia Post-op Note  Patient: Ryan [name]  Procedure(s) Performed: Procedure(s) (LRB): Right great toe amputation (Right)  Patient Location: PACU  Anesthesia Type: General  Level of Consciousness: awake and alert   Airway and Oxygen Therapy: Patient Spontanous Breathing  Post-op Pain: mild  Post-op Assessment: Post-op Vital signs reviewed, Patient's Cardiovascular Status Stable, Respiratory Function Stable, Patent Airway and No signs of Nausea or vomiting  Last Vitals:  Filed Vitals:   01/13/14 1758  BP: 168/78  Pulse: 75  Temp: 36.4 C  Resp: 15    Post-op Vital Signs: stable   Complications: No apparent anesthesia complications

## 2014-01-13 NOTE — Brief Op Note (Signed)
01/11/2014 - 01/13/2014  5:15 PM  PATIENT:  Ryan [name]  53 y.o. male  PRE-OPERATIVE DIAGNOSIS:  Gangrene of Right Great Toe and Osteomyelitis.  POST-OPERATIVE DIAGNOSIS:  Same as Pre-Op  PROCEDURE:  Procedure(s): Right great toe amputation (Right) and resection of portion of First Metararsal  SURGEON:  Surgeon(s) and Role:    * Jacki Conesonald A Gioffre, MD - Primary  PHYSICIAN ASSISTANT:Amber Hemingfordonstable PA   ASSISTANTS: Dimitri PedAmber Constable PA  ANESTHESIA:   general  EBL:     BLOOD ADMINISTERED:none  DRAINS: Penrose drain in the Right Foot   LOCAL MEDICATIONS USED:  NONE  SPECIMEN:  Source of Specimen:  Right Great Toe  DISPOSITION OF SPECIMEN:  PATHOLOGY  COUNTS:  YES  TOURNIQUET:    DICTATION: .Other Dictation: Dictation Number [id number]  PLAN OF CARE: Admit to inpatient   PATIENT DISPOSITION:  PACU - hemodynamically stable.   Delay start of Pharmacological VTE agent (>24hrs) due to surgical blood loss or risk of bleeding: yes

## 2014-01-13 NOTE — Interval H&P Note (Signed)
History and Physical Interval Note:  01/13/2014 3:12 PM  Ryan [name]  has presented today for surgery, with the diagnosis of right great toe ulcer  The various methods of treatment have been discussed with the patient and family. After consideration of risks, benefits and other options for treatment, the patient has consented to  Procedure(s): Right great toe amputation (Right) as a surgical intervention .  The patient's history has been reviewed, patient examined, no change in status, stable for surgery.  I have reviewed the patient's chart and labs.  Questions were answered to the patient's satisfaction.     GIOFFRE,Jock A

## 2014-01-13 NOTE — Anesthesia Preprocedure Evaluation (Addendum)
Anesthesia Evaluation  Patient identified by MRN, date of birth, ID band Patient awake    Reviewed: Allergy & Precautions, H&P , NPO status , Patient's Chart, lab work & pertinent test results  Airway Mallampati: II TM Distance: >3 FB Neck ROM: Full    Dental no notable dental hx.    Pulmonary neg pulmonary ROS,  breath sounds clear to auscultation  Pulmonary exam normal       Cardiovascular negative cardio ROS  Rhythm:Regular Rate:Normal     Neuro/Psych negative neurological ROS  negative psych ROS   GI/Hepatic negative GI ROS, Neg liver ROS,   Endo/Other  diabetes, Type 1, Insulin DependentNewly diagnosed diabetes  Renal/GU negative Renal ROS  negative genitourinary   Musculoskeletal negative musculoskeletal ROS (+)   Abdominal   Peds negative pediatric ROS (+)  Hematology  (+) Blood dyscrasia, ,   Anesthesia Other Findings   Reproductive/Obstetrics negative OB ROS                          Anesthesia Physical Anesthesia Plan  ASA: III  Anesthesia Plan: General   Post-op Pain Management:    Induction: Intravenous  Airway Management Planned: LMA  Additional Equipment:   Intra-op Plan:   Post-operative Plan: Extubation in OR  Informed Consent: I have reviewed the patients History and Physical, chart, labs and discussed the procedure including the risks, benefits and alternatives for the proposed anesthesia with the patient or authorized representative who has indicated his/her understanding and acceptance.   Dental advisory given  Plan Discussed with: CRNA  Anesthesia Plan Comments:         Anesthesia Quick Evaluation

## 2014-01-13 NOTE — Progress Notes (Signed)
[facility] called last night around 2100 pm.  Patient has blood cultures in the aerobic bottle that are positive for gram + cocci in clusters.  NP on call for [facility] Craige CottaKirby was paged and made aware of this information at 2115 pm.  Ryan [name], Meredith SwazilandJordan

## 2014-01-14 ENCOUNTER — Encounter (HOSPITAL_COMMUNITY): Payer: Self-pay | Admitting: Orthopedic Surgery

## 2014-01-14 DIAGNOSIS — M869 Osteomyelitis, unspecified: Secondary | ICD-10-CM

## 2014-01-14 DIAGNOSIS — L97509 Non-pressure chronic ulcer of other part of unspecified foot with unspecified severity: Secondary | ICD-10-CM

## 2014-01-14 DIAGNOSIS — I739 Peripheral vascular disease, unspecified: Secondary | ICD-10-CM

## 2014-01-14 DIAGNOSIS — E1169 Type 2 diabetes mellitus with other specified complication: Secondary | ICD-10-CM

## 2014-01-14 DIAGNOSIS — Z1159 Encounter for screening for other viral diseases: Secondary | ICD-10-CM

## 2014-01-14 LAB — GLUCOSE, CAPILLARY
GLUCOSE-CAPILLARY: 182 mg/dL — AB (ref 70–99)
Glucose-Capillary: 204 mg/dL — ABNORMAL HIGH (ref 70–99)
Glucose-Capillary: 107 mg/dL — ABNORMAL HIGH (ref 70–99)
Glucose-Capillary: 153 mg/dL — ABNORMAL HIGH (ref 70–99)
Glucose-Capillary: 147 mg/dL — ABNORMAL HIGH (ref 70–99)
Glucose-Capillary: 200 mg/dL — ABNORMAL HIGH (ref 70–99)
Glucose-Capillary: 125 mg/dL — ABNORMAL HIGH (ref 70–99)
Glucose-Capillary: 106 mg/dL — ABNORMAL HIGH (ref 70–99)
Glucose-Capillary: 217 mg/dL — ABNORMAL HIGH (ref 70–99)
Glucose-Capillary: 102 mg/dL — ABNORMAL HIGH (ref 70–99)
Glucose-Capillary: 122 mg/dL — ABNORMAL HIGH (ref 70–99)
Glucose-Capillary: 137 mg/dL — ABNORMAL HIGH (ref 70–99)

## 2014-01-14 LAB — BASIC METABOLIC PANEL
BUN: 6 mg/dL (ref 6–23)
CO2: 26 mEq/L (ref 19–32)
Calcium: 8.6 mg/dL (ref 8.4–10.5)
Chloride: 100 mEq/L (ref 96–112)
Creatinine, Ser: 0.91 mg/dL (ref 0.50–1.35)
GFR calc Af Amer: >90 mL/min (ref >90)
GFR calc non Af Amer: >90 mL/min (ref >90)
Glucose, Bld: 91 mg/dL (ref 70–99)
Potassium: 4 mEq/L (ref 3.7–5.3)
Sodium: 138 mEq/L (ref 137–147)

## 2014-01-14 LAB — CULTURE, BLOOD (ROUTINE X 2)

## 2014-01-14 MED ORDER — DOXYCYCLINE HYCLATE 100 MG PO TABS
100.0000 mg | ORAL_TABLET | Freq: Two times a day (BID) | ORAL | Status: DC
  Administered 2014-01-14 – 2014-01-15 (×3): 100 mg via ORAL
  Filled 2014-01-14 (×3): qty 1

## 2014-01-14 MED ORDER — LEVOFLOXACIN 500 MG PO TABS
500.0000 mg | ORAL_TABLET | Freq: Every day | ORAL | Status: DC
  Administered 2014-01-14 – 2014-01-15 (×2): 500 mg via ORAL
  Filled 2014-01-14 (×2): qty 1

## 2014-01-14 NOTE — Op Note (Signed)
NAMOr[place] Dec:  [name], Ryan [name]              ACCOUNT NO.:  [id number]  MEDICAL RECORD NO.:  [id number]  LOCATION:  1329                         [facility]  PHYSICIAN:  Georges Lynchonald A. Gioffre, M.D.DATE OF BIRTH:  [birth year]  DATE OF PROCEDURE:  01/13/2014 DATE OF DISCHARGE:                              OPERATIVE REPORT   SURGEON:  Georges Lynchonald A. [name], M.D.  OPERATIVE ASSISTANT:  Pharmacist, communityAmber Constable, PA-C  PREOPERATIVE DIAGNOSES: 1. Gangrene of the right great toe. 2. Osteomyelitis involving the right foot. 3. Diabetes mellitus. 4. Previous cellulitis of the left foot.  POSTOPERATIVE DIAGNOSES: 1. Gangrene of the right great toe. 2. Osteomyelitis involving the right foot. 3. Diabetes mellitus. 4. Previous cellulitis of the left foot.  OPERATION: 1. Amputation of the right great toe. 2. Resection of the distal portion of the first metatarsal, right     foot.  PROCEDURE IN DETAIL:  Under general anesthesia, routine orthopedic prep and draping of the right foot was carried out.  Appropriate time-out was carried out.  I also marked the appropriate right foot in the holding area.  At this time, I did a disarticulation at the MP joint of the right great toe.  The entire toe was severely gangrenous.  There was no purulent material present.  We did have to proceed with the incision proximally until we had good bleeding tissue.  I then identified the distal aspect of the first metatarsal and I first resected the underlying sesamoid and then resected the distal portion of the first metatarsal.  We then thoroughly irrigated out the area and we inspected the soft tissue, probed the soft tissue.  There was no purulent material present.  We thoroughly irrigated out the area.  The wound was clean.  We had good bleeding tissue.  I inserted the Penrose drain and partially closed the wound in the usual fashion in 1 layer with nylon suture.  Sterile dressings were applied.  The patient will be maintained  on his preop antibiotics.  The specimen was sent to the lab.          ______________________________ Georges Lynchonald A. [name], M.D.     RAG/MEDQ  D:  01/13/2014  T:  01/14/2014  Job:  [id number]

## 2014-01-14 NOTE — Care Management Note (Signed)
    Page 1 of 2   01/14/2014     9:36:59 AM CARE MANAGEMENT NOTE 01/14/2014  Patient:  Christiana PellantLEONARD,Gurman R   Account Number:  [id number]  Date Initiated:  01/12/2014  Documentation initiated by:  Jiles CrockerHANDLER,BRENDA  Subjective/Objective Assessment:   ADMITTED WITH GANGREEN OF FEET     Action/Plan:   MULTIPLE PROBLEMS IDENTIFIED- WILL NEED HHC AT DC, ONGOING EDUCATION, FINANCIAL COUNSELOR TO ADDRESS FUNDS FOR PT, MED ASSISTANCE.....- CM  WILL CONTINUE TO FOLLOW FOR DC   Anticipated DC Date:  01/20/2014   Anticipated DC Plan:  HOME W HOME HEALTH SERVICES  In-house referral  Financial Counselor      DC Planning Services  CM consult      Choice offered to / List presented to:             Status of service:  In process, will continue to follow Medicare Important Message given?   (If response is "NO", the following Medicare IM given date fields will be blank) Date Medicare IM given:   Date Additional Medicare IM given:    Discharge Disposition:    Per UR Regulation:  Reviewed for med. necessity/level of care/duration of stay  If discussed at Long Length of Stay Meetings, dates discussed:    Comments:  01/14/14 09:20 [facility] and follow up appt available February 03, 2014 at 2:00pm.  Appt cancelled at Dr Ashley RoyaltyMatthews office thorugh [place] for July 17.  Pt also has appt July 6 at 2:00pm [facility]. [facility] pamphlet with both appts written on pamphlet given to pt.  Will continue to follow for disposition needs.  Freddy JakschSarah Ziad Maye, BSN, [place] [phone].  01/12/2014- Cherrie DistanceBrenda L Chandler, RN Case Manager Signed CASE MANAGEMENT Progress Notes Service date: 01/12/2014 12:10 PM CM CONSULT Numerous problems identified- No PCP - apt made with Dr Ashley [facility]Matthews [facility]( [facility] and unable to make an apt and referred to Dr Ashley RoyaltyMatthews office) - apt made for July 17,2015 at 10:45am. No insurance - the Financial counselor will see the patient to see if he qualifies  for resources that may be available in the community to assist the pt; will possibly qualify [facility] ( [facility]) at discharge; [facility] so that patient can get his orange card to assist him with his medication ongoing - July 6,2015 at 2pm. Possible HHC at discharge, HHRN,PT, Disease Management Program for DM, patient NEEDS lots of education in this area.CM will continue to follow for DCAlexis Goodell; B Chandler RN,BSN,MHA [phone]   6/15/2015Abelino Derrick- B CHANDLER RN,BSN,MHA [phone]

## 2014-01-14 NOTE — Evaluation (Addendum)
Physical Therapy Evaluation-1x eval Patient Details Name: Ryan [name] MRN: [id number] DOB: 17-Feb-1961 Today's Date: 01/14/2014   History of Present Illness  53 yo male s/p R great toe amputation and 1st metatarsal resection due to gangrene, osteomyelitis. hx of DM, cellulitis.   Clinical Impression  On eval, pt was supervision level assist for mobility-able to ambulate ~1000 feet in hallway without device while managing IV pole. Pt denied pain-tolerated activity well. No further PT needs at this time. Cautioned pt against "overdoing" activity/using weights on R LE. Encouraged rest and elevation of R foot.     Follow Up Recommendations No PT follow up    Equipment Recommendations  None recommended by PT    Recommendations for Other Services       Precautions / Restrictions Precautions Precautions: None Required Braces or Orthoses: Other Brace/Splint Other Brace/Splint: post-op shoe Restrictions Weight Bearing Restrictions: No      Mobility  Bed Mobility Overal bed mobility: Independent                Transfers Overall transfer level: Independent                  Ambulation/Gait Ambulation/Gait assistance: Modified independent (Device/Increase time) Ambulation Distance (Feet): 1000 Feet Assistive device: None Gait Pattern/deviations: Decreased stride length;Decreased weight shift to right     General Gait Details: slow gait speed. VCs safety, caution due to difference in height surfaces of shoes. Pt requested to walk a 2nd lap around unit.   Stairs            Wheelchair Mobility    Modified Rankin (Stroke Patients Only)       Balance                                             Pertinent Vitals/Pain Pt denies pain    Home Living Family/patient expects to be discharged to:: Private residence Living Arrangements: Alone   Type of Home: House Home Access: Stairs to enter;Ramped entrance Entrance Stairs-Rails:  Right Entrance Stairs-Number of Steps: 2-3  Home Layout: One level Home Equipment: None      Prior Function Level of Independence: Independent               Hand Dominance        Extremity/Trunk Assessment   Upper Extremity Assessment: Overall WFL for tasks assessed           Lower Extremity Assessment: Overall WFL for tasks assessed;RLE deficits/detail RLE Deficits / Details: r ankle NT    Cervical / Trunk Assessment: Normal  Communication   Communication: No difficulties  Cognition Arousal/Alertness: Awake/alert Behavior During Therapy: WFL for tasks assessed/performed Overall Cognitive Status: Within Functional Limits for tasks assessed                      General Comments      Exercises        Assessment/Plan    PT Assessment Patent does not need any further PT services  PT Diagnosis     PT Problem List    PT Treatment Interventions     PT Goals (Current goals can be found in the Care Plan section) Acute Rehab PT Goals Patient Stated Goal: return to PLOF PT Goal Formulation: No goals set, d/c therapy    Frequency     Barriers to discharge  Co-evaluation               End of Session   Activity Tolerance: Patient tolerated treatment well Patient left: in bed;with call bell/phone within reach           Time: [id number] PT Time Calculation (min): 35 min   Charges:   PT Evaluation $Initial PT Evaluation Tier I: 1 Procedure PT Treatments $Gait Training: 23-37 mins   PT G Codes:          Rebeca AlertJannie Porter, MPT Pager: [phone]

## 2014-01-14 NOTE — Consult Note (Signed)
[facility]ious [facility]    Date of Admission:  01/11/2014  Date of Consult:  01/14/2014  Reason for Consult: Diabetic Foot osteomyelitis with PVD Referring Physician: Dr Cruzita Lederer   HPI: Ryan [name] is an 53 y.o. male who has just been diagnosed with DM. He apparently used his recently deceased girlfriend's glucometer and noted high blood sugars. She was apparently being treated "for MRSA" with antibiotics via a PICC line and the patient apparently blames the PICC line for his girlfriend's death because he says that she developed an infection from it.   Regardless the patient himself states that he developed a foot ulcer on right foot after having been at the beach over [place] day weekend. He states that the tissue and skin simply fell off. He was treating himself with "all natural antibiotics" which sounds like homeopathic medicine and failed to improve, he presented with gangrene of his 1st toe and was brought to ED. He had blood cultures drawn on admission and 1/2 = Coag Negative staph and other with NGTD. He was started on vancomycin and zosyn.  MRI of right foot showed:  Extensive edema about the foot without abscess.  Abnormal appearance of the distal phalanx of the great toe  worrisome for osteonecrosis.   Mild edema and enhancement in the base of the proximal phalanx of  the great toe  Osteolysis of the tuft of the distal phalanx of the second toe  And a second Small focus of marrow signal abnormality in the middle  Phalanx  worrisome for osteomyelitis.   Small first MTP joint effusion.   MRI of the left foot showed:  cortical irregularity with marrow edema and enhancement on  postcontrast images of the first distal phalanx concerning for osteomyelitis. There is  surrounding soft tissue edema with enhancement of the first distal  phalanx.   He was taken to the OR by Dr. Gladstone Lighter yesterday 16th, and performed Right great toe amputation (Right) and  resection of portion of First Metararsal.  Necrotic tissue encountered but no frank pus. No cultures were taken but bone sent to pathology lab.   He is currently on zosyn and vancomycin.  After much discussion he seems unwilling to go home with PICC< due to a) fear of infectious complication, b) fear of not being able to work with this in place.      History reviewed. No pertinent past medical history.  Past Surgical History  Procedure Laterality Date  . Amputation Right 01/13/2014    Procedure: Right great toe amputation;  Surgeon: Tobi Bastos, MD;  Location: [facility];  Service: Orthopedics;  Laterality: Right;  ergies:   No Known Allergies   Medications: I have reviewed patients current medications as documented in Epic Anti-infectives   Start     Dose/Rate Route Frequency Ordered Stop   01/14/14 2200  doxycycline (VIBRA-TABS) tablet 100 mg     100 mg Oral Every 12 hours 01/14/14 1845     01/14/14 1900  levofloxacin (LEVAQUIN) tablet 500 mg     500 mg Oral Daily 01/14/14 1845     01/13/14 1631  polymyxin B 500,000 Units, bacitracin 50,000 Units in sodium chloride irrigation 0.9 % 500 mL irrigation  Status:  Discontinued       As needed 01/13/14 1631 01/13/14 1715   01/12/14 0600  vancomycin (VANCOCIN) IVPB 1000 mg/200 mL premix  Status:  Discontinued     1,000 mg 200 mL/hr over 60 Minutes Intravenous Every 8  hours 01/12/14 0324 01/14/14 1845   01/11/14 2230  piperacillin-tazobactam (ZOSYN) IVPB 3.375 g  Status:  Discontinued     3.375 g 12.5 mL/hr over 240 Minutes Intravenous 3 times per day 01/11/14 2222 01/14/14 1845   01/11/14 1930  vancomycin (VANCOCIN) IVPB 1000 mg/200 mL premix     1,000 mg 200 mL/hr over 60 Minutes Intravenous  Once 01/11/14 1917 01/11/14 2126      Social History:  reports that he has never smoked. He does not have any smokeless tobacco history on file. He reports that he drinks alcohol. He reports that he does not use illicit drugs.  History  reviewed. No pertinent family history.  As in HPI and primary teams notes otherwise 12 point review of systems is negative  Blood pressure 140/77, pulse 71, temperature 99 F (37.2 C), temperature source Oral, resp. rate 18, height 5' 11"  (1.803 m), weight 163 lb 11.2 oz (74.254 kg), SpO2 100.00%. General: Alert and awake, oriented x3, not in any acute distress. HEENT: anicteric sclera, pupils reactive to light and accommodation, EOMI,  CVS regular rate, normal r, Chest:  no wheezing, no resp distress Abdomen: soft  nondistended  Extremities, Skin  Right foot wrapped in bandages  Left foot with necrotic appearing tissue in tip of toe and on dorsum of foot        Neuro: nonfocal   Results for orders placed during the hospital encounter of 01/11/14 (from the past 48 hour(s))  GLUCOSE, CAPILLARY     Status: Abnormal   Collection Time    01/12/14  9:50 PM      Result Value Ref Range   Glucose-Capillary 200 (*) 70 - 99 mg/dL  BASIC METABOLIC PANEL     Status: Abnormal   Collection Time    01/13/14  3:50 AM      Result Value Ref Range   Sodium 137  137 - 147 mEq/L   Comment: DELTA CHECK NOTED     REPEATED TO VERIFY   Potassium 4.0  3.7 - 5.3 mEq/L   Chloride 101  96 - 112 mEq/L   Comment: DELTA CHECK NOTED     REPEATED TO VERIFY   CO2 26  19 - 32 mEq/L   Glucose, Bld 121 (*) 70 - 99 mg/dL   BUN 7  6 - 23 mg/dL   Creatinine, Ser 0.88  0.50 - 1.35 mg/dL   Calcium 8.7  8.4 - 10.5 mg/dL   GFR calc non Af Amer >90  >90 mL/min   GFR calc Af Amer >90  >90 mL/min   Comment: (NOTE)     The eGFR has been calculated using the CKD EPI equation.     This calculation has not been validated in all clinical situations.     eGFR's persistently <90 mL/min signify possible Chronic Kidney     Disease.  CBC     Status: Abnormal   Collection Time    01/13/14  3:50 AM      Result Value Ref Range   WBC 10.9 (*) 4.0 - 10.5 K/uL   RBC 3.86 (*) 4.22 - 5.81 MIL/uL   Hemoglobin 11.8 (*)  13.0 - 17.0 g/dL   HCT 37.9 (*) 39.0 - 52.0 %   MCV 98.2  78.0 - 100.0 fL   MCH 30.6  26.0 - 34.0 pg   MCHC 31.1  30.0 - 36.0 g/dL   RDW 13.0  11.5 - 15.5 %   Platelets 331  150 - 400 K/uL  GLUCOSE, CAPILLARY     Status: Abnormal   Collection Time    01/13/14  7:36 AM      Result Value Ref Range   Glucose-Capillary 125 (*) 70 - 99 mg/dL   Comment 1 Documented in Chart     Comment 2 Notify RN    SURGICAL PCR SCREEN     Status: None   Collection Time    01/13/14 10:14 AM      Result Value Ref Range   MRSA, PCR NEGATIVE  NEGATIVE   Staphylococcus aureus NEGATIVE  NEGATIVE   Comment:            The Xpert SA Assay (FDA     approved for NASAL specimens     in patients over 64 years of age),     is one component of     a comprehensive surveillance     program.  Test performance has     been validated by [employer] for patients greater     than or equal to 33 year old.     It is not intended     to diagnose infection nor to     guide or monitor treatment.  CULTURE, BLOOD (ROUTINE X 2)     Status: None   Collection Time    01/13/14 10:20 AM      Result Value Ref Range   Specimen Description BLOOD L HAND     Special Requests BOTTLES DRAWN AEROBIC AND ANAEROBIC 5 CC     Culture  Setup Time       Value: 01/13/2014 12:55     Performed at [facility]   Culture       Value:        BLOOD CULTURE RECEIVED NO GROWTH TO DATE CULTURE WILL BE HELD FOR 5 DAYS BEFORE ISSUING A FINAL NEGATIVE REPORT     Performed at [facility]   Report Status PENDING    CULTURE, BLOOD (ROUTINE X 2)     Status: None   Collection Time    01/13/14 10:20 AM      Result Value Ref Range   Specimen Description BLOOD R ARM     Special Requests BOTTLES DRAWN AEROBIC AND ANAEROBIC 10 CC     Culture  Setup Time       Value: 01/13/2014 12:55     Performed at [facility]   Culture       Value:        BLOOD CULTURE RECEIVED NO GROWTH TO DATE CULTURE WILL BE HELD FOR 5 DAYS BEFORE  ISSUING A FINAL NEGATIVE REPORT     Performed at [facility]   Report Status PENDING    GLUCOSE, CAPILLARY     Status: Abnormal   Collection Time    01/13/14 11:56 AM      Result Value Ref Range   Glucose-Capillary 106 (*) 70 - 99 mg/dL   Comment 1 Documented in Chart     Comment 2 Notify RN    VANCOMYCIN, TROUGH     Status: None   Collection Time    01/13/14  1:13 PM      Result Value Ref Range   Vancomycin Tr 17.5  10.0 - 20.0 ug/mL  GLUCOSE, CAPILLARY     Status: Abnormal   Collection Time    01/13/14  3:13 PM      Result Value Ref Range   Glucose-Capillary 138 (*) 70 -  99 mg/dL  GLUCOSE, CAPILLARY     Status: Abnormal   Collection Time    01/13/14  5:26 PM      Result Value Ref Range   Glucose-Capillary 115 (*) 70 - 99 mg/dL  GLUCOSE, CAPILLARY     Status: Abnormal   Collection Time    01/13/14  8:58 PM      Result Value Ref Range   Glucose-Capillary 217 (*) 70 - 99 mg/dL  BASIC METABOLIC PANEL     Status: None   Collection Time    01/14/14  4:05 AM      Result Value Ref Range   Sodium 138  137 - 147 mEq/L   Potassium 4.0  3.7 - 5.3 mEq/L   Chloride 100  96 - 112 mEq/L   CO2 26  19 - 32 mEq/L   Glucose, Bld 91  70 - 99 mg/dL   BUN 6  6 - 23 mg/dL   Creatinine, Ser 0.91  0.50 - 1.35 mg/dL   Calcium 8.6  8.4 - 10.5 mg/dL   GFR calc non Af Amer >90  >90 mL/min   GFR calc Af Amer >90  >90 mL/min   Comment: (NOTE)     The eGFR has been calculated using the CKD EPI equation.     This calculation has not been validated in all clinical situations.     eGFR's persistently <90 mL/min signify possible Chronic Kidney     Disease.  GLUCOSE, CAPILLARY     Status: Abnormal   Collection Time    01/14/14  7:14 AM      Result Value Ref Range   Glucose-Capillary 102 (*) 70 - 99 mg/dL   Comment 1 Documented in Chart     Comment 2 Notify RN    GLUCOSE, CAPILLARY     Status: Abnormal   Collection Time    01/14/14 11:58 AM      Result Value Ref Range    Glucose-Capillary 122 (*) 70 - 99 mg/dL   Comment 1 Documented in Chart     Comment 2 Notify RN    GLUCOSE, CAPILLARY     Status: Abnormal   Collection Time    01/14/14  4:37 PM      Result Value Ref Range   Glucose-Capillary 137 (*) 70 - 99 mg/dL      Component Value Date/Time   SDES BLOOD L HAND 01/13/2014 1020   SDES BLOOD R ARM 01/13/2014 1020   SPECREQUEST BOTTLES DRAWN AEROBIC AND ANAEROBIC 5 CC 01/13/2014 1020   SPECREQUEST BOTTLES DRAWN AEROBIC AND ANAEROBIC 10 CC 01/13/2014 1020   CULT  Value:        BLOOD CULTURE RECEIVED NO GROWTH TO DATE CULTURE WILL BE HELD FOR 5 DAYS BEFORE ISSUING A FINAL NEGATIVE REPORT Performed at [facility] 01/13/2014 1020   CULT  Value:        BLOOD CULTURE RECEIVED NO GROWTH TO DATE CULTURE WILL BE HELD FOR 5 DAYS BEFORE ISSUING A FINAL NEGATIVE REPORT Performed at [facility] 01/13/2014 1020   REPTSTATUS PENDING 01/13/2014 1020   REPTSTATUS PENDING 01/13/2014 1020   Mr Foot Right W Wo Contrast  01/13/2014   CLINICAL DATA:  Foot ulcerations for 3 weeks. Discoloration of the right great toe.  EXAM: MRI OF THE RIGHT FOREFOOT WITHOUT AND WITH CONTRAST  TECHNIQUE: Multiplanar, multisequence MR imaging was performed both before and after administration of intravenous contrast.  CONTRAST:  15 cc MultiHance IV.  COMPARISON:  Plain films of right foot 01/11/2014.  FINDINGS: Extensive soft tissue edema and enhancement are present about the foot consistent with cellulitis. Intrinsic musculature also demonstrates edema and postcontrast enhancement which could be due to myositis or denervation atrophy.  The patient has a small first MTP joint effusion. Mild marrow edema and enhancement are seen in the proximal 1 cm of the base of the proximal phalanx of the great toe. Minimal marrow edema and enhancement in the plantar aspect of the first metatarsal head subjacent to the lateral sesamoid is likely related to degenerative disease.  The distal phalanx of the  great toe demonstrates areas of decreased signal on T1 weighted imaging but no postcontrast enhancement worrisome for osteo necrosis. There is poor signal in the distal phalanx. There appears to osteolysis of the left of the distal phalanx of the second toe. A small area of decreased T1 signal is seen in the medial aspect of the middle phalanx.  IMPRESSION: Extensive cellulitis about the foot without abscess.  Abnormal appearance of the distal phalanx of the great toe is worrisome for osteonecrosis.  Mild edema and enhancement in the base of the proximal phalanx of the great toe is likely reactive to cellulitis rather than due to osteomyelitis.  Osteolysis of the tuft of the distal phalanx of the second toe is identified. Small focus of marrow signal abnormality in the middle phalanx is worrisome for osteomyelitis.  Small first MTP joint effusion. Given normal signal in the head of the first metatarsal, septic joint is unlikely.   Electronically Signed   By: Inge Rise M.D.   On: 01/13/2014 08:36   Mr Foot Left W Wo Contrast  01/13/2014   CLINICAL DATA:  Osteomyelitis, lower extremity ulceration toe discoloration. Possible osteomyelitis on x-ray at the tip of the left great toe.  EXAM: MRI OF THE LEFT FOREFOOT WITHOUT AND WITH CONTRAST  TECHNIQUE: Multiplanar, multisequence MR imaging was performed both before and after administration of intravenous contrast.  CONTRAST:  9m MULTIHANCE GADOBENATE DIMEGLUMINE 529 MG/ML IV SOLN  COMPARISON:  None.  FINDINGS: There is cortical irregularity with marrow edema and enhancement on postcontrast images of the first distal phalanx. There is surrounding soft tissue edema with enhancement of the first distal phalanx.  There is no other focal marrow signal abnormality. There is no fracture or dislocation. There is no fluid collection or hematoma. The visualized flexor and extensor tendons are intact. The visualized musculature is normal in signal.  IMPRESSION: 1.  Findings most concerning for osteomyelitis of the tip of the first distal phalanx of the left foot with surrounding cellulitis.   Electronically Signed   By: HKathreen Devoid  On: 01/13/2014 08:30     Recent Results (from the past 720 hour(s))  CULTURE, BLOOD (ROUTINE X 2)     Status: None   Collection Time    01/11/14  6:55 PM      Result Value Ref Range Status   Specimen Description BLOOD LEFT FOREARM  [facility]   Final   Special Requests NONE   Final   Culture  Setup Time     Final   Value: 01/12/2014 00:45     [facility]  Culture     Final   Value: STAPHYLOCOCCUS SPECIES (COAGULASE NEGATIVE)     Note: THE SIGNIFICANCE OF ISOLATING THIS ORGANISM FROM A SINGLE SET OF BLOOD CULTURES WHEN MULTIPLE SETS ARE DRAWN IS UNCERTAIN. PLEASE NOTIFY THE MICROBIOLOGY DEPARTMENT WITHIN  ONE WEEK IF SPECIATION AND SENSITIVITIES ARE REQUIRED.     Note: Gram Stain Report Called to,Read Back By and Verified With: MEREDITH AT 2053 01/12/14 BY SNOLO     Performed at [facility]   Report Status 01/14/2014 FINAL   Final  CULTURE, BLOOD (ROUTINE X 2)     Status: None   Collection Time    01/11/14  7:00 PM      Result Value Ref Range Status   Specimen Description BLOOD RIGHT FOREARM  4 ML IN [facility] BOTTLE   Final   Special Requests NONE   Final   Culture  Setup Time     Final   Value: 01/12/2014 00:45     Performed at [facility]   Culture     Final   Value:        BLOOD CULTURE RECEIVED NO GROWTH TO DATE CULTURE WILL BE HELD FOR 5 DAYS BEFORE ISSUING A FINAL NEGATIVE REPORT     Performed at [facility]   Report Status PENDING   Incomplete  SURGICAL PCR SCREEN     Status: None   Collection Time    01/13/14 10:14 AM      Result Value Ref Range Status   MRSA, PCR NEGATIVE  NEGATIVE Final   Staphylococcus aureus NEGATIVE  NEGATIVE Final   Comment:            The Xpert SA Assay (FDA     approved for NASAL specimens     in patients over 21 years of  age),     is one component of     a comprehensive surveillance     program.  Test performance has     been validated by [employer] for patients greater     than or equal to 16 year old.     It is not intended     to diagnose infection nor to     guide or monitor treatment.  CULTURE, BLOOD (ROUTINE X 2)     Status: None   Collection Time    01/13/14 10:20 AM      Result Value Ref Range Status   Specimen Description BLOOD L HAND   Final   Special Requests BOTTLES DRAWN AEROBIC AND ANAEROBIC 5 CC   Final   Culture  Setup Time     Final   Value: 01/13/2014 12:55     Performed at [facility]   Culture     Final   Value:        BLOOD CULTURE RECEIVED NO GROWTH TO DATE CULTURE WILL BE HELD FOR 5 DAYS BEFORE ISSUING A FINAL NEGATIVE REPORT     Performed at [facility]   Report Status PENDING   Incomplete  CULTURE, BLOOD (ROUTINE X 2)     Status: None   Collection Time    01/13/14 10:20 AM      Result Value Ref Range Status   Specimen Description BLOOD R ARM   Final   Special Requests BOTTLES DRAWN AEROBIC AND ANAEROBIC 10 CC   Final   Culture  Setup Time     Final   Value: 01/13/2014 12:55     Performed at [facility]   Culture     Final   Value:        BLOOD CULTURE RECEIVED NO GROWTH TO DATE CULTURE WILL BE HELD FOR 5 DAYS BEFORE ISSUING A FINAL NEGATIVE REPORT  Performed at [facility]   Report Status PENDING   Incomplete     Impression/Recommendation  Principal Problem:   Gangrene associated with diabetes mellitus Active Problems:   Diabetes mellitus   Osteomyelitis   Hyponatremia   Leukocytosis, unspecified   Gram-positive bacteremia   Osteomyelitis of right foot   Osteomyelitis of foot, right, acute   TYLIEK TIMBERMAN is a 53 y.o. male with  Recently diagnosed DM, A1c 7.6 necrotic osteomyelitis of 1st toe, osteomyelits of 2nd toe RIght side, osteomyelitis of the 1st toe on left side with necrotit changes of tissue at that  site and in left foot. His ABI's indicate vascular disease at macroscopic level on left side though he undoubtedly has bilateral PVD  #1 Diabetic Foot Osteomyelitis: We have NO deep cultures to guide therapy. Usual suspects would be GAS, GBS, MSSA, MRSA. Necrotic areas would make one concerned for synergistic anerobic infection. His exposure to seawater would also put Vibrio vulnificas as a potential pathogen  Today he does NOT want PICC or IV abx but he would like to think about it  I will place him on oral levaquin 598m daily with excellent bio-availability, strep coverage and also activity vs Vibrio if it were in play here  I will also start him on doxy to cover for MRSA (and it also coincidentally is used to treat Vibrio)  If he reconsiders IV abx we could consider sending him home with IV vancomycin and IV ceftazidime  He should continue these antibiotics for minimum of 6 weeks and will need protracted further oral antibiotics (potentially same ones, though we will run higher risk of CDI with levaquin  He NEEDS offloading scheme, foot wear for BOTH feet to ensure that these DFI heal properly  Fortunately he does not smoke, DM needs to be optimally managed and pt CAPABLE of managing this himself.  He needs Medicaid plug in and he needs a visit to [facility]  Chronically he will need a podiatrist or other wound care specialist unless he will chronically be followed by Dr. GGladstone Lighter I spent greater than 60 minutes with the patient including greater than 50% of time in face to face counsel of the patient and in coordination of their care.     #2 PVD: I suppose this is due to chronic uncontrolled DM though his A1c is NOT terribly high. I will also send off labs for vasculitis workup with ANCA, ANA , cryoglobulins  #3 Screening: check HIV, Hep panel     ABI showing  Left side with moderate reduciotn in blood flow  01/14/2014, 6:48 PM   Thank you so much for this interesting  consult  [phone](pager) [phone](office) 01/14/2014, 6:48 PM  CRhina BrackettDam 01/14/2014, 6:48 PM

## 2014-01-14 NOTE — Progress Notes (Signed)
PROGRESS NOTE  Ryan [name]:[id number] DOB: 1961/04/04 DOA: 01/11/2014 PCP: No primary provider on file.  HPI: 53 y.o. male, with no significant past medical history who presented to [facility] ED 01/11/2014 with 3 week history of lower extremity ulcers / toe discoloration. Imaging studies included X ray left and right foot were significant for possible osteomyelitis at the tip of the left great toe distal phalanx and osteomyelitis of the distal phalanx in the great toe with a soft tissue gas producing abscess. 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 MRI of the left foot showed findings most concerning for osteomyelitis of the tip of the first distal phalanx of the left foot. Hospital course is complicated due to additional finding of gram-positive cocci bacteremia as well as new onset diabetes.  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  - orthopedic surgery consulted, s/p Right great toe amputation (Right) and resection of portion of First Metararsal - patient is on vancomycin and Zosyn.   Gram-positive cocci bacteremia  - prelim is coag negative staph, one set only, may be a contaminant  - ID consulted this morning  Diabetes mellitus with complications of gangrene  - A1c is 7.6 on this admission indicating diagnosis of diabetes. Patient has been seen by diabetic coordinator and recommendation was to start Levemir / SSI.  Hyponatremia - Likely dehydration. Has resolved with IV fluids.  Leukocytosis  - Secondary to osteomyelitis, gram-positive cocci bacteremia   Diet: carb modifiec Fluids: none DVT Prophylaxis: Lovenox  Code Status: Full Family Communication: d/w patient  Disposition Plan: home when ready  Consultants:  Orthopedic surgery    Infectious disease  Procedures:  Right great toe amputation (Right) and resection of portion of First Metararsal   Antibiotics Vancomycin 6/14 >> Zosyn 6/14 >>  HPI/Subjective: - no complaints this morning  Objective: Filed Vitals:   01/13/14 2026 01/13/14 2300 01/14/14 0227 01/14/14 0620  BP: 160/58 139/72 132/79 146/71  Pulse: 84 72 75 72  Temp: 97.9 F (36.6 C) 98 F (36.7 C) 97.7 F (36.5 C) 97.9 F (36.6 C)  TempSrc: Oral Oral Oral Oral  Resp: 16 16 18 18   Height:      Weight:      SpO2: 100% 100% 100% 100%    Intake/Output Summary (Last 24 hours) at 01/14/14 0935 Last data filed at 01/14/14 0844  Gross per 24 hour  Intake 2596.67 ml  Output   1425 ml  Net 1171.67 ml   Filed Weights   01/11/14 1927 01/11/14 2200  Weight: 76.204 kg (168 lb) 74.254 kg (163 lb 11.2 oz)    Exam:   General:  NAD  Cardiovascular: regular rate and rhythm, without MRG  Respiratory: good air movement, clear to auscultation throughout, no wheezing, ronchi or rales  Abdomen: soft, not tender to palpation, positive bowel sounds  MSK: no peripheral edema, multiple healing ulcers left foot, left toe, right foot wrapped   Neuro: nonfocal  Data Reviewed: Basic Metabolic Panel:  Recent Labs Lab 01/11/14 1855 01/13/14 0350 01/14/14 0405  NA 126* 137 138  K 4.1 4.0 4.0  CL 89* 101 100  CO2 23 26 26   GLUCOSE 327* 121* 91  BUN 13 7 6   CREATININE 0.87 0.88 0.91  CALCIUM 8.9 8.7 8.6  Liver Function Tests:  Recent Labs Lab 01/11/14 1855  AST 14  ALT 12  ALKPHOS 68  BILITOT 0.3  PROT 8.6*  ALBUMIN 2.9*   CBC:  Recent Labs Lab 01/11/14 1855 01/13/14 0350  WBC 16.8* 10.9*  NEUTROABS 14.2*  --   HGB 13.5 11.8*  HCT 40.6 37.9*  MCV 93.1 98.2  PLT 387 331   BNP (last 3 results)  Recent Labs  01/11/14 1855  PROBNP 148.5*   CBG:  Recent Labs Lab 01/11/14 1850 01/11/14 2114 01/13/14 1513 01/13/14 1726  GLUCAP 294* 77 138* 115*    Recent  Results (from the past 240 hour(s))  CULTURE, BLOOD (ROUTINE X 2)     Status: None   Collection Time    01/11/14  6:55 PM      Result Value Ref Range Status   Specimen Description BLOOD LEFT FOREARM  [facility]   Final   Special Requests NONE   Final   Culture  Setup Time     Final   Value: 01/12/2014 00:45     Performed at [facility]   Culture     Final   Value: STAPHYLOCOCCUS SPECIES (COAGULASE NEGATIVE)     Note: THE SIGNIFICANCE OF ISOLATING THIS ORGANISM FROM A SINGLE SET OF BLOOD CULTURES WHEN MULTIPLE SETS ARE DRAWN IS UNCERTAIN. PLEASE NOTIFY THE MICROBIOLOGY DEPARTMENT WITHIN ONE WEEK IF SPECIATION AND SENSITIVITIES ARE REQUIRED.     Note: Gram Stain Report Called to,Read Back By and Verified With: MEREDITH AT 2053 01/12/14 BY SNOLO     Performed at [facility]   Report Status 01/14/2014 FINAL   Final  CULTURE, BLOOD (ROUTINE X 2)     Status: None   Collection Time    01/11/14  7:00 PM      Result Value Ref Range Status   Specimen Description BLOOD RIGHT FOREARM  [facility]   Final   Special Requests NONE   Final   Culture  Setup Time     Final   Value: 01/12/2014 00:45     Performed at [facility]   Culture     Final   Value:        BLOOD CULTURE RECEIVED NO GROWTH TO DATE CULTURE WILL BE HELD FOR 5 DAYS BEFORE ISSUING A FINAL NEGATIVE REPORT     Performed at [facility]   Report Status PENDING   Incomplete  SURGICAL PCR SCREEN     Status: None   Collection Time    01/13/14 10:14 AM      Result Value Ref Range Status   MRSA, PCR NEGATIVE  NEGATIVE Final   Staphylococcus aureus NEGATIVE  NEGATIVE Final   Comment:            The Xpert SA Assay (FDA     approved for NASAL specimens     in patients over 51 years of age),     is one component of     a comprehensive surveillance     program.  Test performance has     been validated by [facility] for patients greater     than or equal to 51 year old.     It is  not intended     to diagnose infection nor to     guide or monitor treatment.  CULTURE, BLOOD (ROUTINE X 2)     Status: None   Collection Time  01/13/14 10:20 AM      Result Value Ref Range Status   Specimen Description BLOOD L HAND   Final   Special Requests BOTTLES DRAWN AEROBIC AND ANAEROBIC 5 CC   Final   Culture  Setup Time     Final   Value: 01/13/2014 12:55     Performed at [facility]   Culture     Final   Value:        BLOOD CULTURE RECEIVED NO GROWTH TO DATE CULTURE WILL BE HELD FOR 5 DAYS BEFORE ISSUING A FINAL NEGATIVE REPORT     Performed at [facility]   Report Status PENDING   Incomplete  CULTURE, BLOOD (ROUTINE X 2)     Status: None   Collection Time    01/13/14 10:20 AM      Result Value Ref Range Status   Specimen Description BLOOD R ARM   Final   Special Requests BOTTLES DRAWN AEROBIC AND ANAEROBIC 10 CC   Final   Culture  Setup Time     Final   Value: 01/13/2014 12:55     Performed at [facility]   Culture     Final   Value:        BLOOD CULTURE RECEIVED NO GROWTH TO DATE CULTURE WILL BE HELD FOR 5 DAYS BEFORE ISSUING A FINAL NEGATIVE REPORT     Performed at [facility]   Report Status PENDING   Incomplete     Studies: Mr Foot Right W Wo Contrast  01/13/2014   CLINICAL DATA:  Foot ulcerations for 3 weeks. Discoloration of the right great toe.  EXAM: MRI OF THE RIGHT FOREFOOT WITHOUT AND WITH CONTRAST  TECHNIQUE: Multiplanar, multisequence MR imaging was performed both before and after administration of intravenous contrast.  CONTRAST:  15 cc MultiHance IV.  COMPARISON:  Plain films of right foot 01/11/2014.  FINDINGS: Extensive soft tissue edema and enhancement are present about the foot consistent with cellulitis. Intrinsic musculature also demonstrates edema and postcontrast enhancement which could be due to myositis or denervation atrophy.  The patient has a small first MTP joint effusion. Mild marrow edema and enhancement  are seen in the proximal 1 cm of the base of the proximal phalanx of the great toe. Minimal marrow edema and enhancement in the plantar aspect of the first metatarsal head subjacent to the lateral sesamoid is likely related to degenerative disease.  The distal phalanx of the great toe demonstrates areas of decreased signal on T1 weighted imaging but no postcontrast enhancement worrisome for osteo necrosis. There is poor signal in the distal phalanx. There appears to osteolysis of the left of the distal phalanx of the second toe. A small area of decreased T1 signal is seen in the medial aspect of the middle phalanx.  IMPRESSION: Extensive cellulitis about the foot without abscess.  Abnormal appearance of the distal phalanx of the great toe is worrisome for osteonecrosis.  Mild edema and enhancement in the base of the proximal phalanx of the great toe is likely reactive to cellulitis rather than due to osteomyelitis.  Osteolysis of the tuft of the distal phalanx of the second toe is identified. Small focus of marrow signal abnormality in the middle phalanx is worrisome for osteomyelitis.  Small first MTP joint effusion. Given normal signal in the head of the first metatarsal, septic joint is unlikely.   Electronically Signed   By: Drusilla Kannerhomas  Dalessio M.D.   On: 01/13/2014 08:36   Mr  Foot Left W Wo Contrast  01/13/2014   CLINICAL DATA:  Osteomyelitis, lower extremity ulceration toe discoloration. Possible osteomyelitis on x-ray at the tip of the left great toe.  EXAM: MRI OF THE LEFT FOREFOOT WITHOUT AND WITH CONTRAST  TECHNIQUE: Multiplanar, multisequence MR imaging was performed both before and after administration of intravenous contrast.  CONTRAST:  15mL MULTIHANCE GADOBENATE DIMEGLUMINE 529 MG/ML IV SOLN  COMPARISON:  None.  FINDINGS: There is cortical irregularity with marrow edema and enhancement on postcontrast images of the first distal phalanx. There is surrounding soft tissue edema with enhancement of the  first distal phalanx.  There is no other focal marrow signal abnormality. There is no fracture or dislocation. There is no fluid collection or hematoma. The visualized flexor and extensor tendons are intact. The visualized musculature is normal in signal.  IMPRESSION: 1. Findings most concerning for osteomyelitis of the tip of the first distal phalanx of the left foot with surrounding cellulitis.   Electronically Signed   By: Elige Ko   On: 01/13/2014 08:30    Scheduled Meds: . enoxaparin (LOVENOX) injection  40 mg Subcutaneous Q24H  . ferrous sulfate  325 mg Oral TID PC  . insulin aspart  0-15 Units Subcutaneous TID WC  . insulin aspart  0-5 Units Subcutaneous QHS  . insulin detemir  10 Units Subcutaneous QHS  . piperacillin-tazobactam (ZOSYN)  IV  3.375 g Intravenous 3 times per day  . vancomycin  1,000 mg Intravenous Q8H   Continuous Infusions: . lactated ringers 1,000 mL (01/13/14 1510)    Principal Problem:   Gangrene associated with diabetes mellitus Active Problems:   Diabetes mellitus   Osteomyelitis   Hyponatremia   Leukocytosis, unspecified   Gram-positive bacteremia   Osteomyelitis of right foot   Osteomyelitis of foot, right, acute   Time spent: 35  This note has been created with Education officer, environmental. Any transcriptional errors are unintentional.   Pamella Pert, MD [facility] Pager [phone]. If 7 PM - 7 AM, please contact night-coverage at [email or web address], password [facility] 01/14/2014, 9:35 AM  LOS: 3 days

## 2014-01-15 LAB — GLUCOSE, CAPILLARY
GLUCOSE-CAPILLARY: 102 mg/dL — AB (ref 70–99)
Glucose-Capillary: 175 mg/dL — ABNORMAL HIGH (ref 70–99)
Glucose-Capillary: 125 mg/dL — ABNORMAL HIGH (ref 70–99)

## 2014-01-15 LAB — SEDIMENTATION RATE: Sed Rate: 105 mm/hr — ABNORMAL HIGH (ref 0–16)

## 2014-01-15 LAB — C-REACTIVE PROTEIN: CRP: 9.6 mg/dL — AB (ref <0.60)

## 2014-01-15 LAB — BASIC METABOLIC PANEL
BUN: 8 mg/dL (ref 6–23)
CHLORIDE: 100 meq/L (ref 96–112)
CO2: 25 mEq/L (ref 19–32)
CREATININE: 0.76 mg/dL (ref 0.50–1.35)
Calcium: 9.1 mg/dL (ref 8.4–10.5)
GFR calc Af Amer: >90 mL/min (ref >90)
GFR calc non Af Amer: >90 mL/min (ref >90)
Glucose, Bld: 113 mg/dL — ABNORMAL HIGH (ref 70–99)
Potassium: 4 mEq/L (ref 3.7–5.3)
Sodium: 139 mEq/L (ref 137–147)

## 2014-01-15 LAB — HEPATITIS PANEL, ACUTE
HCV Ab: NEGATIVE
HEP B C IGM: NONREACTIVE
HEP B S AG: NEGATIVE
Hep A IgM: NONREACTIVE

## 2014-01-15 LAB — HIV ANTIBODY (ROUTINE TESTING W REFLEX): HIV 1&2 Ab, 4th Generation: NONREACTIVE

## 2014-01-15 MED ORDER — INSULIN DETEMIR 100 UNIT/ML ~~LOC~~ SOLN: 10.0000 [IU] | Freq: Every day | SUBCUTANEOUS | Status: DC

## 2014-01-15 MED ORDER — GLUCOSE BLOOD VI STRP: ORAL_STRIP | Status: DC

## 2014-01-15 MED ORDER — FERROUS SULFATE 325 (65 FE) MG PO TABS: 325.0000 mg | ORAL_TABLET | Freq: Three times a day (TID) | ORAL | Status: DC

## 2014-01-15 MED ORDER — LEVOFLOXACIN 500 MG PO TABS: 500.0000 mg | ORAL_TABLET | Freq: Every day | ORAL | Status: DC

## 2014-01-15 MED ORDER — TRUEPLUS LANCETS 33G MISC: 1.0000 | Freq: Three times a day (TID) | Status: DC

## 2014-01-15 MED ORDER — OXYCODONE HCL 5 MG PO TABS: 5.0000 mg | ORAL_TABLET | ORAL | Status: DC | PRN

## 2014-01-15 MED ORDER — INSULIN DETEMIR 100 UNIT/ML FLEXPEN: 10.0000 [IU] | PEN_INJECTOR | Freq: Every day | SUBCUTANEOUS | Status: DC

## 2014-01-15 MED ORDER — "INSULIN SYRINGE-NEEDLE U-100 30G X 5/16"" 0.5 ML MISC": 1.0000 | Freq: Every day | Status: DC

## 2014-01-15 MED ORDER — DOXYCYCLINE HYCLATE 100 MG PO TABS: 100.0000 mg | ORAL_TABLET | Freq: Two times a day (BID) | ORAL | Status: DC

## 2014-01-15 MED ORDER — METFORMIN HCL 500 MG PO TABS: 1000.0000 mg | ORAL_TABLET | Freq: Two times a day (BID) | ORAL | Status: DC

## 2014-01-15 NOTE — Discharge Summary (Signed)
Physician Discharge Summary  Ryan [name] XBJ:[id number] DOB: 02/28/1961 DOA: 01/11/2014  PCP: No primary provider on file.  Admit date: 01/11/2014 Discharge date: 01/15/2014  Time spent: 35 minutes  Recommendations for Outpatient Follow-up:  1. Follow up with PCP in 1-2 weeks. Appointment in community health clinic on 7/7 2. Follow up with Dr. Darrelyn Hillock on Monday 3. Follow up with ID in 2 weeks  Discharge Diagnoses:  Principal Problem:   Gangrene associated with diabetes mellitus Active Problems:   Diabetes mellitus   Osteomyelitis   Hyponatremia   Leukocytosis, unspecified   Gram-positive bacteremia   Osteomyelitis of right foot   Osteomyelitis of foot, right, acute   Discharge Condition: stable  Diet recommendation: regular   Filed Weights   01/11/14 1927 01/11/14 2200  Weight: 76.204 kg (168 lb) 74.254 kg (163 lb 11.2 oz)    History of present illness:  Ryan [name] is a 53 y.o. male, with no significant past medical history who recently found out that he is diabetic 3 weeks ago while using a friend's glucometer presenting with 3 weeks history of lower extremity ulcers that have been progressing quickly. He took her off a friend of his who was being treated for MRSA and she died 2 weeks ago. The patient denies any previous episodes of pain or ulcers in his lower or upper extremities, no history of chest pains or shortness of breath. He first noticed this ulcer 3 weeks ago and they progressed to a right first toe gangrene and his friends brought him here to be evaluated. Patient reports taking his.friend's insulin to treat himself .  Hospital Course:  Gangrene or the right and left great toe / osteomyelitis / cellulitis  - X-ray studies as well as MRI of both feet are suggestive of osteomyelitis and right foot particularly even for osteonecrosis.  - orthopedic surgery consulted, s/p Right great toe amputation (Right) and resection of portion of First Metararsal  -  patient is on vancomycin and Zosyn, ID consulted. Patient refuses PICC Line placement and will be discharged on oral agents per ID.  Gram-positive cocci bacteremia  - prelim is coag negative staph, final cultures pending, has surveillance cultures which have remained negative, will follow up with ID and in Community health and wellness clinic.  - again, patient has refused PICC Line as IV antibiotics are initial choice for long term treatment.  Diabetes mellitus with complications of gangrene  - A1c is 7.6 on this admission indicating diagnosis of diabetes. Patient has been seen by diabetic coordinator and recommendation was to start Levemir / SSI.  - will start Levemir 10 U nightly and Metformin. Once infection is under control, he can probably come off of his insulin.  Hyponatremia - Likely dehydration. Has resolved with IV fluids.  Leukocytosis - Secondary to osteomyelitis, gram-positive cocci bacteremia   Procedures:  Right great toe amputation 6/16 by Dr. Darrelyn Hillock  PREOPERATIVE DIAGNOSES:  1. Gangrene of the right great toe.  2. Osteomyelitis involving the right foot.  3. Diabetes mellitus.  4. Previous cellulitis of the left foot.   POSTOPERATIVE DIAGNOSES:  1. Gangrene of the right great toe.  2. Osteomyelitis involving the right foot.  3. Diabetes mellitus.  4. Previous cellulitis of the left foot.   OPERATION:  1. Amputation of the right great toe.  2. Resection of the distal portion of the first metatarsal, right  foot.  Consultations:  Orthopedic surgery   Infectious disease  Discharge Exam: Filed Vitals:   01/14/14  1404 01/14/14 2138 01/15/14 0547 01/15/14 1428  BP: 140/77 147/87 143/82 127/75  Pulse: 71 80 74 78  Temp: 99 F (37.2 C) 98.8 F (37.1 C) 98.2 F (36.8 C) 98.1 F (36.7 C)  TempSrc: Oral Oral Oral Oral  Resp: 18  18 18   Height:      Weight:      SpO2: 100% 100% 100% 100%    General: NAD Cardiovascular: RRR Respiratory: CTA  biL  Discharge Instructions     Medication List         B-12 PO  Take 1 tablet by mouth daily.     doxycycline 100 MG tablet  Commonly known as:  VIBRA-TABS  Take 1 tablet (100 mg total) by mouth every 12 (twelve) hours.     ferrous sulfate 325 (65 FE) MG tablet  Take 1 tablet (325 mg total) by mouth 3 (three) times daily after meals.     Insulin Detemir 100 UNIT/ML Pen  Commonly known as:  LEVEMIR  Inject 10 Units into the skin daily at 10 pm.     levofloxacin 500 MG tablet  Commonly known as:  LEVAQUIN  Take 1 tablet (500 mg total) by mouth daily at 12 noon.     metFORMIN 500 MG tablet  Commonly known as:  GLUCOPHAGE  Take 2 tablets (1,000 mg total) by mouth 2 (two) times daily with a meal. Start with 1 tablet twice daily for 1 week then increase to 2 tablets twice daily     oxyCODONE 5 MG immediate release tablet  Commonly known as:  Oxy IR/ROXICODONE  Take 1 tablet (5 mg total) by mouth every 4 (four) hours as needed for moderate pain.     UNABLE TO FIND  Apply 1 application topically daily. Silver shield gel           Follow-up Information   Follow up with [facility]    . (Eligability apt for an [place] card to assist with your medication - July 6,2015 at 2pm & follow up appt for medical care is July 7, at 2:00pm w Dr. Hyman Hopes)    Contact information:   [address] [place] [address] [phone]      Follow up with Acey Lav, MD. Schedule an appointment as soon as possible for a visit in 2 weeks.   Specialty:  Infectious Diseases   Contact information:   [address] [place] [address] [phone]       Follow up with GIOFFRE,Isamar A, MD. Schedule an appointment as soon as possible for a visit in 1 week.   Specialty:  Orthopedic Surgery   Contact information:   [address] [place] [address] [phone]      The results of significant  diagnostics from this hospitalization (including imaging, microbiology, ancillary and laboratory) are listed below for reference.    Significant Diagnostic Studies: Dg Tibia/fibula Left  01/11/2014   CLINICAL DATA:  Painful, bilateral painful feet and lower legs. Redness, swelling.  EXAM: LEFT TIBIA AND FIBULA - 2 VIEW  COMPARISON:  None.  FINDINGS: No acute bony abnormality. Specifically, no fracture, subluxation, or dislocation. Soft tissues are intact. 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 No knee joint effusion.  IMPRESSION: No acute bony abnormality.   Electronically Signed   By: Charlett Nose M.D.   On: 01/11/2014 19:53   Dg Tibia/fibula Right  01/11/2014   CLINICAL DATA:  Bilateral lower extremity redness,  swelling, pain.  EXAM: RIGHT TIBIA AND FIBULA - 2 VIEW  COMPARISON:  None.  FINDINGS: Mild to moderate degenerative changes within the right knee with joint space narrowing and spurring. No acute bony abnormality. Specifically, no fracture, subluxation, or dislocation. Soft tissues are intact.  IMPRESSION: No acute bony abnormality.   Electronically Signed   By: Charlett Nose M.D.   On: 01/11/2014 19:54   Mr Foot Right W Wo Contrast  01/13/2014   CLINICAL DATA:  Foot ulcerations for 3 weeks. Discoloration of the right great toe.  EXAM: MRI OF THE RIGHT FOREFOOT WITHOUT AND WITH CONTRAST  TECHNIQUE: Multiplanar, multisequence MR imaging was performed both before and after administration of intravenous contrast.  CONTRAST:  15 cc MultiHance IV.  COMPARISON:  Plain films of right foot 01/11/2014.  FINDINGS: Extensive soft tissue edema and enhancement are present about the foot consistent with cellulitis. Intrinsic musculature also demonstrates edema and postcontrast enhancement which could be due to myositis or denervation atrophy.  The patient has a small first MTP joint effusion. Mild marrow edema and enhancement are seen in the proximal 1 cm of the base of the proximal phalanx of the great toe. Minimal  marrow edema and enhancement in the plantar aspect of the first metatarsal head subjacent to the lateral sesamoid is likely related to degenerative disease.  The distal phalanx of the great toe demonstrates areas of decreased signal on T1 weighted imaging but no postcontrast enhancement worrisome for osteo necrosis. There is poor signal in the distal phalanx. There appears to osteolysis of the left of the distal phalanx of the second toe. A small area of decreased T1 signal is seen in the medial aspect of the middle phalanx.  IMPRESSION: Extensive cellulitis about the foot without abscess.  Abnormal appearance of the distal phalanx of the great toe is worrisome for osteonecrosis.  Mild edema and enhancement in the base of the proximal phalanx of the great toe is likely reactive to cellulitis rather than due to osteomyelitis.  Osteolysis of the tuft of the distal phalanx of the second toe is identified. Small focus of marrow signal abnormality in the middle phalanx is worrisome for osteomyelitis.  Small first MTP joint effusion. Given normal signal in the head of the first metatarsal, septic joint is unlikely.   Electronically Signed   By: Drusilla Kanner M.D.   On: 01/13/2014 08:36   Mr Foot Left W Wo Contrast  01/13/2014   CLINICAL DATA:  Osteomyelitis, lower extremity ulceration toe discoloration. Possible osteomyelitis on x-ray at the tip of the left great toe.  EXAM: MRI OF THE LEFT FOREFOOT WITHOUT AND WITH CONTRAST  TECHNIQUE: Multiplanar, multisequence MR imaging was performed both before and after administration of intravenous contrast.  CONTRAST:  15mL MULTIHANCE GADOBENATE DIMEGLUMINE 529 MG/ML IV SOLN  COMPARISON:  None.  FINDINGS: There is cortical irregularity with marrow edema and enhancement on postcontrast images of the first distal phalanx. There is surrounding soft tissue edema with enhancement of the first distal phalanx.  There is no other focal marrow signal abnormality. There is no fracture  or dislocation. There is no fluid collection or hematoma. The visualized flexor and extensor tendons are intact. The visualized musculature is normal in signal.  IMPRESSION: 1. Findings most concerning for osteomyelitis of the tip of the first distal phalanx of the left foot with surrounding cellulitis.   Electronically Signed   By: Elige Ko   On: 01/13/2014 08:30   Dg Chest [facility]  01/11/2014   CLINICAL DATA:  Bilateral lower extremity infections.  EXAM: PORTABLE CHEST - 1 VIEW  COMPARISON:  None.  FINDINGS: The heart size and mediastinal contours are within normal limits. Both lungs are clear. The visualized skeletal structures are unremarkable.  IMPRESSION: No active disease.   Electronically Signed   By: Charlett NoseKevin  Dover M.D.   On: 01/11/2014 19:25   Dg Foot Complete Left  01/11/2014   CLINICAL DATA:  Bilateral foot pain, redness, swelling.  EXAM: LEFT FOOT - COMPLETE 3+ VIEW  COMPARISON:  None.  FINDINGS: There is irregular lucency at the tip of the left great toe distal phalanx concerning for osteomyelitis. There appears to be a overlying soft tissue defect at the tip of the great toe. Recommend clinical correlation.  No additional acute bony abnormality. No fracture, subluxation or dislocation.  IMPRESSION: Findings suspicious for osteomyelitis at the tip of the left great toe distal phalanx.   Electronically Signed   By: Charlett NoseKevin  Dover M.D.   On: 01/11/2014 19:53   Dg Foot Complete Right  01/11/2014   CLINICAL DATA:  Infection.  EXAM: RIGHT FOOT COMPLETE - 3+ VIEW  COMPARISON:  Ankle films of the same day.  FINDINGS: Extensive gas surrounds the distal phalanx in the right great toe. There is marked osteopenia and erosion of the tuft. There is mild osteopenia in the distal aspect of the proximal phalanx is well. The joint is located.  Extensive soft tissue swelling is noted in the distal second toe is well with a sclerotic foreshortened distal phalanx. No other focal bone changes are evident. The  proximal foot is unremarkable.  IMPRESSION: 1. Osteomyelitis of the distal phalanx in the great toe with a soft tissue gas producing abscess. 2. Soft tissue swelling about the distal aspect second toe with a sclerotic foreshortened distal phalanx. This may represent chronic infection.   Electronically Signed   By: Gennette Pachris  Mattern M.D.   On: 01/11/2014 19:55    Microbiology: Recent Results (from the past 240 hour(s))  CULTURE, BLOOD (ROUTINE X 2)     Status: None   Collection Time    01/11/14  6:55 PM      Result Value Ref Range Status   Specimen Description BLOOD LEFT FOREARM  3 [facility]   Final   Special Requests NONE   Final   Culture  Setup Time     Final   Value: 01/12/2014 00:45     Performed at [facility]   Culture     Final   Value: STAPHYLOCOCCUS SPECIES (COAGULASE NEGATIVE)     Note: THE SIGNIFICANCE OF ISOLATING THIS ORGANISM FROM A SINGLE SET OF BLOOD CULTURES WHEN MULTIPLE SETS ARE DRAWN IS UNCERTAIN. PLEASE NOTIFY THE MICROBIOLOGY DEPARTMENT WITHIN ONE WEEK IF SPECIATION AND SENSITIVITIES ARE REQUIRED.     Note: Gram Stain Report Called to,Read Back By and Verified With: MEREDITH AT 2053 01/12/14 BY SNOLO     Performed at [facility]   Report Status 01/14/2014 FINAL   Final  CULTURE, BLOOD (ROUTINE X 2)     Status: None   Collection Time    01/11/14  7:00 PM      Result Value Ref Range Status   Specimen Description BLOOD RIGHT FOREARM  4 ML IN [facility]ACH BOTTLE   Final   Special Requests NONE   Final   Culture  Setup Time     Final   Value: 01/12/2014 00:45     Performed at  [facility]   Culture     Final   Value:        BLOOD CULTURE RECEIVED NO GROWTH TO DATE CULTURE WILL BE HELD FOR 5 DAYS BEFORE ISSUING A FINAL NEGATIVE REPORT     Performed at [facility]   Report Status PENDING   Incomplete  SURGICAL PCR SCREEN     Status: None   Collection Time    01/13/14 10:14 AM      Result Value Ref Range Status   MRSA, PCR NEGATIVE   NEGATIVE Final   Staphylococcus aureus NEGATIVE  NEGATIVE Final   Comment:            The Xpert SA Assay (FDA     approved for NASAL specimens     in patients over 51 years of age),     is one component of     a comprehensive surveillance     program.  Test performance has     been validated by [facility] for patients greater     than or equal to [address]10 year old.     It is not intended     to diagnose infection nor to     guide or monitor treatment.  CULTURE, BLOOD (ROUTINE X 2)     Status: None   Collection Time    01/13/14 10:20 AM      Result Value Ref Range Status   Specimen Description BLOOD L HAND   Final   Special Requests BOTTLES DRAWN AEROBIC AND ANAEROBIC 5 CC   Final   Culture  Setup Time     Final   Value: 01/13/2014 12:55     Performed at [facility]   Culture     Final   Value:        BLOOD CULTURE RECEIVED NO GROWTH TO DATE CULTURE WILL BE HELD FOR 5 DAYS BEFORE ISSUING A FINAL NEGATIVE REPORT     Performed at [facility]   Report Status PENDING   Incomplete  CULTURE, BLOOD (ROUTINE X 2)     Status: None   Collection Time    01/13/14 10:20 AM      Result Value Ref Range Status   Specimen Description BLOOD R ARM   Final   Special Requests BOTTLES DRAWN AEROBIC AND ANAEROBIC 10 CC   Final   Culture  Setup Time     Final   Value: 01/13/2014 12:55     Performed at [facility]   Culture     Final   Value:        BLOOD CULTURE RECEIVED NO GROWTH TO DATE CULTURE WILL BE HELD FOR 5 DAYS BEFORE ISSUING A FINAL NEGATIVE REPORT     Performed at [facility]   Report Status PENDING   Incomplete    Labs: Basic Metabolic Panel:  Recent Labs Lab 01/11/14 1855 01/13/14 0350 01/14/14 0405 01/15/14 0335  NA 126* 137 138 139  K 4.1 4.0 4.0 4.0  CL 89* 101 100 100  CO2 23 26 26 25   GLUCOSE 327* 121* 91 113*  BUN 13 7 6 8   CREATININE 0.87 0.88 0.91 0.76  CALCIUM 8.9 8.7 8.6 9.1   Liver Function Tests:  Recent Labs Lab  01/11/14 1855  AST 14  ALT 12  ALKPHOS 68  BILITOT 0.3  PROT 8.6*  ALBUMIN 2.9*   CBC:  Recent Labs Lab 01/11/14 1855 01/13/14 0350  WBC 16.8* 10.9*  NEUTROABS 14.2*  --   HGB 13.5 11.8*  HCT 40.6 37.9*  MCV 93.1 98.2  PLT 387 331   BNP: BNP (last 3 results)  Recent Labs  01/11/14 1855  PROBNP 148.5*   CBG:  Recent Labs Lab 01/14/14 1158 01/14/14 1637 01/14/14 2136 01/15/14 0717 01/15/14 1158  GLUCAP 122* 137* 182* 102* 175*   Signed:  GHERGHE, COSTIN  [facility] 01/15/2014, 4:42 PM

## 2014-01-15 NOTE — Progress Notes (Signed)
                  Dear ___________ Sherolyn Bubaonald Matteo ________________:  Bonita QuinYou have been approved to have your discharge prescriptions filled through our [facility] ([facility]) program. This program allows for a one-time (no refills) 34-day supply of selected medications for a low copay amount.  The copay is $3.00 per prescription. For instance, if you have one prescription, you will pay $3.00; for two prescriptions, you pay $6.00; for three prescriptions, you pay $9.00; and so on.  Only certain pharmacies are participating in this program with [facility]. You will need to select one of the pharmacies from the attached list and take your prescriptions, this letter, and your photo ID to one of the participating pharmacies.   We are excited that you are able to use the [facility] program to get your medications. These prescriptions must be filled within 7 days of hospital discharge or they will no longer be valid for the [facility] program. Should you have any problems with your prescriptions please contact your case management team member at [phone].  Thank you,   [facility]   Participating [facility] Pharmacies  [facility]   [facility] [id number] [place], [place]   [facility] [address], [place]   [facility] [address], [address] [place], [place]   [facility]   [address], [place], [place]   [id number], [place], [place]   [address], [place], [place]   [id number] [address], [place], [place]   [id number] [address], [place], [place]   [id number] [address], [place], [place]   [address], [place], [place]   [address], [place], [place]   [address], [place], [place]   [id number], [place], [place]  [facility]   [address], [place], [place]   [id number] [place],  [place]   [id number], [place], [place]   [id number] [address], [place], [place]   [address], [place], [place]   [id number] [place] [address], [place], [place] [facility]   [address], [place], [place]   [address], [place], [place]   [id number] [address], [place], [place]   [address], [place], [place]   [address], [place], [place]   [address], [place], [place]   [address], [place], [place]   [address], [place], [place]   [id number] [address], [place], [place]   [address], [place], [place]   [address], [place], [place]   [address], [place], [place]   [address], [place], [place]   [id number], [place], [place]  Independent Pharmacies   [facility] [address] [place], [place]   [facility] [address] [place], [place]   [facility] [address] [place], [place]   For continued medication needs, please contact the [facility] at  [phone].

## 2014-01-15 NOTE — Progress Notes (Signed)
Utilization review completed.  

## 2014-01-15 NOTE — Progress Notes (Signed)
CARE MANAGEMENT ED NOTE 01/15/2014  Patient:  Ryan [name],Ryan [name]   Account Number:  [id number]  Date Initiated:  01/15/2014  Documentation initiated by:  Radford PaxFERRERO,AMY  Subjective/Objective Assessment:   Patient admitted to 3 west unit for possible ostomyelitis of left great vtoe.     Subjective/Objective Assessment Detail:     Action/Plan:   Action/Plan Detail:   Anticipated DC Date:  01/15/2014     Status Recommendation to Physician:   Result of Recommendation:    Other ED Services  Consult Working Plan    DC Planning Services  CM consult  MATCH Program  Medication Assistance   [facility]  HOME HEALTH   Choice offered to / List presented to:       [facility]  [facility]      [facility]  [facility]    Status of service:  Completed, signed off  ED Comments:   ED Comments Detail:  [facility] to assist patient with medications. EDCM looked at AVS and saw that patient was to be discharged on the Levimir pen.  Patient is without insurance.  Patient is a newly diagnosed diabetic.  EDCM spoke to diabetic coordinator Enrique SackKendra who recommended 70/30 8-10 units bid. EDCM then spoke to Dr. Wyonia HoughGerghe who dis not want the patient to be started on 70/30.  EDCM then spoke to [place]rist[place], the pharmacist at [facility] who informed [facility] that they will do a one time assist for Levimir in a vial,free syringes, free strips and free lancets.  Pharmacist Belenda CruiseKristin took patient's name and birthday and will leave a message for pharmacist tomorrow to expect patient to come for assistance with diabetic supplies and insulin. EDCspoke to Dr. Wyonia HoughGerghe who then placed orders for true results glucometer, true test testing strips, true plus lancets and Levimir insulin in a vial (not the pen).  EDCM spoke twith patient at bedside. Patient reports he is not homeless at this time, but may be in the near future.  Patient reports he works part time. He is currently not working due to the injury to his left foot.   Patient [facility] scheduled for July 7th with Dr. Hyman HopesJegede.  Patient also has an appointment with financial counselor for the orange card on July 6th at 2pm.  [facility]. [facility] explained to patient that he would not be able to use the [facility] for the oxycodone.  EDCM also instructed not to use MATCH for levimir insulin; to take the insulin prescription with RX's for diabetic supplies [facility].  Patient agreeable to pay twelve dollar copay for other RX's.  Patient concerned he does not know how to draw up the insulin.  [facility] asked floor RN to instruct patient on how to draw up and inject insulin.  Patient concerned that his ride home won't arrive on time and won't be able to get his  medications filled this evening.  [facility] that was on the [facility] pharmacy list.  [facility] explained to patient that this was a one time assist and will not be eligible for this service until this time next year.  EDCM explained to patient that he only has seven days to fill his prescriptions or else the program is void.  Patient continually interupted EDCM while EDCM was explaining the services being offered to him.  EDCM used teach back. Patient was able to tell [facility] that [facility]  [facility] [address] to fill his prescriptions for insulin and diabetic supplies and [facility] the [facility]  Patient also reports [facility] for a dressing chaange to left foot.  Through chart review, EDCM noticied an order for home health RN.  Patient is without insurance.  [facility] asked patient if he would be able to affird a visiting RN? Patient responded, "Probably not. But the orthopedic doctor said that I can go to his office once a week and get my dressing changed.I can probably change the dressing myself."  EDCM spoke to Dr. Wyonia HoughGerghe to let him know that patient may not  receive home health services due to lack of insurance.  EDCM also called at left message at Dr. [name] orthopedic informing them of possiblity of patient not being able to receive home health services.  [facility] over home health order for home health RN to [facility] at 2030pm with confirmation of receipt at 2036pm. [facility] will email K. Leticia [facility]Rivera [facility] informing her of patient's arrival tomorrow.  No further EDCM needs at this time.

## 2014-01-15 NOTE — Progress Notes (Signed)
Subjective: 2 Days Post-Op Procedure(s) (LRB): Right great toe amputation (Right) Patient reports pain as 1 on 0-10 scale.  Seen by me yesterday and today. Drain DCd and wound looks fine so far. I explained to the patient that we need to watch his wound for possible decrease blood supply because of his diabetes. I did his surgery WITHOUT a tourniquet for the above mentionde reason. Will follow in office as discussed with the Patient.  Objective: Vital signs in last 24 hours: Temp:  [98.1 F (36.7 C)-98.8 F (37.1 C)] 98.1 F (36.7 C) (06/18 1428) Pulse Rate:  [74-80] 78 (06/18 1428) Resp:  [18] 18 (06/18 1428) BP: (127-147)/(75-87) 127/75 mmHg (06/18 1428) SpO2:  [100 %] 100 % (06/18 1428)  Intake/Output from previous day: 06/17 0701 - 06/18 0700 In: 720 [P.O.:720] Out: -  Intake/Output this shift: Total I/O In: 120 [P.O.:120] Out: -    Recent Labs  01/13/14 0350  HGB 11.8*    Recent Labs  01/13/14 0350  WBC 10.9*  RBC 3.86*  HCT 37.9*  PLT 331    Recent Labs  01/14/14 0405 01/15/14 0335  NA 138 139  K 4.0 4.0  CL 100 100  CO2 26 25  BUN 6 8  CREATININE 0.91 0.76  GLUCOSE 91 113*  CALCIUM 8.6 9.1   No results found for this basename: LABPT, INR,  in the last 72 hours  Incision looks fine. No signs of an infection.  Assessment/Plan: 2 Days Post-Op Procedure(s) (LRB): Right great toe amputation (Right) Discharge home with home health. Office Monday  GIOFFRE,Massimiliano A 01/15/2014, 3:47 PM

## 2014-01-15 NOTE — Progress Notes (Signed)
[facility]  Day #2 levaquin Day #2 doxycyline  Subjective: No new complaints   Antibiotics:  Anti-infectives   Start     Dose/Rate Route Frequency Ordered Stop   01/15/14 0000  doxycycline (VIBRA-TABS) 100 MG tablet     100 mg Oral Every 12 hours 01/15/14 1413     01/15/14 0000  levofloxacin (LEVAQUIN) 500 MG tablet     500 mg Oral Daily 01/15/14 1413     01/14/14 2200  doxycycline (VIBRA-TABS) tablet 100 mg     100 mg Oral Every 12 hours 01/14/14 1845     01/14/14 2000  levofloxacin (LEVAQUIN) tablet 500 mg     500 mg Oral Daily 01/14/14 1845     01/13/14 1631  polymyxin B 500,000 Units, bacitracin 50,000 Units in sodium chloride irrigation 0.9 % 500 mL irrigation  Status:  Discontinued       As needed 01/13/14 1631 01/13/14 1715   01/12/14 0600  vancomycin (VANCOCIN) IVPB 1000 mg/200 mL premix  Status:  Discontinued     1,000 mg 200 mL/hr over 60 Minutes Intravenous Every 8 hours 01/12/14 0324 01/14/14 1845   01/11/14 2230  piperacillin-tazobactam (ZOSYN) IVPB 3.375 g  Status:  Discontinued     3.375 g 12.5 mL/hr over 240 Minutes Intravenous 3 times per day 01/11/14 2222 01/14/14 1845   01/11/14 1930  vancomycin (VANCOCIN) IVPB 1000 mg/200 mL premix     1,000 mg 200 mL/hr over 60 Minutes Intravenous  Once 01/11/14 1917 01/11/14 2126      Medications: Scheduled Meds: . doxycycline  100 mg Oral Q12H  . enoxaparin (LOVENOX) injection  40 mg Subcutaneous Q24H  . ferrous sulfate  325 mg Oral TID PC  . insulin aspart  0-15 Units Subcutaneous TID WC  . insulin aspart  0-5 Units Subcutaneous QHS  . insulin detemir  10 Units Subcutaneous QHS  . levofloxacin  500 mg Oral Q1200   Continuous Infusions:  PRN Meds:.morphine injection, ondansetron (ZOFRAN) IV, ondansetron, oxyCODONE    Objective: Weight change:   Intake/Output Summary (Last 24 hours) at 01/15/14 1525 Last data filed at 01/15/14 0818  Gross per 24 hour  Intake    360 ml    Output      0 ml  Net    360 ml   Blood pressure 127/75, pulse 78, temperature 98.1 F (36.7 C), temperature source Oral, resp. rate 18, height 5\' 11"  (1.803 m), weight 163 lb 11.2 oz (74.254 kg), SpO2 100.00%. Temp:  [98.1 F (36.7 C)-98.8 F (37.1 C)] 98.1 F (36.7 C) (06/18 1428) Pulse Rate:  [74-80] 78 (06/18 1428) Resp:  [18] 18 (06/18 1428) BP: (127-147)/(75-87) 127/75 mmHg (06/18 1428) SpO2:  [100 %] 100 % (06/18 1428)  Physical Exam:  General: Alert and awake, oriented x3, not in any acute distress.  HEENT: anicteric sclera, pupils reactive to light and accommodation, EOMI,  CVS regular rate, normal r,  Chest: no wheezing, no resp distress  Abdomen: soft nondistended  Extremities, Skin  Right foot wrapped in bandages  Left foot with necrotic appearing tissue in tip of toe and on dorsum of foot unchanged since yesterday (see pics below from yesterday)       CBC:  Recent Labs Lab 01/11/14 1855 01/13/14 0350  HGB 13.5 11.8*  HCT 40.6 37.9*  PLT 387 331     BMET  Recent Labs  01/14/14 0405 01/15/14 0335  NA 138 139  K 4.0 4.0  CL  100 100  CO2 26 25  GLUCOSE 91 113*  BUN 6 8  CREATININE 0.91 0.76  CALCIUM 8.6 9.1     Liver Panel  No results found for this basename: PROT, ALBUMIN, AST, ALT, ALKPHOS, BILITOT, BILIDIR, IBILI,  in the last 72 hours     Sedimentation Rate  Recent Labs  01/15/14 0335  ESRSEDRATE 105*   C-Reactive Protein  Recent Labs  01/15/14 0335  CRP 9.6*    Micro Results: Recent Results (from the past 240 hour(s))  CULTURE, BLOOD (ROUTINE X 2)     Status: None   Collection Time    01/11/14  6:55 PM      Result Value Ref Range Status   Specimen Description BLOOD LEFT FOREARM  3 ML IN [facility] BOTTLE   Final   Special Requests NONE   Final   Culture  Setup Time     Final   Value: 01/12/2014 00:45     Performed at [facility]   Culture     Final   Value: STAPHYLOCOCCUS SPECIES (COAGULASE NEGATIVE)      Note: THE SIGNIFICANCE OF ISOLATING THIS ORGANISM FROM A SINGLE SET OF BLOOD CULTURES WHEN MULTIPLE SETS ARE DRAWN IS UNCERTAIN. PLEASE NOTIFY THE MICROBIOLOGY DEPARTMENT WITHIN ONE WEEK IF SPECIATION AND SENSITIVITIES ARE REQUIRED.     Note: Gram Stain Report Called to,Read Back By and Verified With: MEREDITH AT 2053 01/12/14 BY SNOLO     Performed at [facility]   Report Status 01/14/2014 FINAL   Final  CULTURE, BLOOD (ROUTINE X 2)     Status: None   Collection Time    01/11/14  7:00 PM      Result Value Ref Range Status   Specimen Description BLOOD RIGHT FOREARM  4 ML IN [facility] BOTTLE   Final   Special Requests NONE   Final   Culture  Setup Time     Final   Value: 01/12/2014 00:45     Performed at [facility]   Culture     Final   Value:        BLOOD CULTURE RECEIVED NO GROWTH TO DATE CULTURE WILL BE HELD FOR 5 DAYS BEFORE ISSUING A FINAL NEGATIVE REPORT     Performed at [facility]   Report Status PENDING   Incomplete  SURGICAL PCR SCREEN     Status: None   Collection Time    01/13/14 10:14 AM      Result Value Ref Range Status   MRSA, PCR NEGATIVE  NEGATIVE Final   Staphylococcus aureus NEGATIVE  NEGATIVE Final   Comment:            The Xpert SA Assay (FDA     approved for NASAL specimens     in patients over 38 years of age),     is one component of     a comprehensive surveillance     program.  Test performance has     been validated by [employer] for patients greater     than or equal to 55 year old.     It is not intended     to diagnose infection nor to     guide or monitor treatment.  CULTURE, BLOOD (ROUTINE X 2)     Status: None   Collection Time    01/13/14 10:20 AM      Result Value Ref Range Status   Specimen Description BLOOD L  HAND   Final   Special Requests BOTTLES DRAWN AEROBIC AND ANAEROBIC 5 CC   Final   Culture  Setup Time     Final   Value: 01/13/2014 12:55     Performed at [facility]   Culture     Final    Value:        BLOOD CULTURE RECEIVED NO GROWTH TO DATE CULTURE WILL BE HELD FOR 5 DAYS BEFORE ISSUING A FINAL NEGATIVE REPORT     Performed at [facility]   Report Status PENDING   Incomplete  CULTURE, BLOOD (ROUTINE X 2)     Status: None   Collection Time    01/13/14 10:20 AM      Result Value Ref Range Status   Specimen Description BLOOD R ARM   Final   Special Requests BOTTLES DRAWN AEROBIC AND ANAEROBIC 10 CC   Final   Culture  Setup Time     Final   Value: 01/13/2014 12:55     Performed at [facility]   Culture     Final   Value:        BLOOD CULTURE RECEIVED NO GROWTH TO DATE CULTURE WILL BE HELD FOR 5 DAYS BEFORE ISSUING A FINAL NEGATIVE REPORT     Performed at [facility]   Report Status PENDING   Incomplete    Studies/Results: No results found.    Assessment/Plan:  Principal Problem:   Gangrene associated with diabetes mellitus Active Problems:   Diabetes mellitus   Osteomyelitis   Hyponatremia   Leukocytosis, unspecified   Gram-positive bacteremia   Osteomyelitis of right foot   Osteomyelitis of foot, right, acute    Ryan [name] is a 53 y.o. male with  Recently diagnosed DM, A1c 7.6 necrotic osteomyelitis of 1st toe, osteomyelits of 2nd toe RIght side, osteomyelitis of the 1st toe on left side with necrotit changes of tissue at that site and in left foot. His ABI's indicate vascular disease at macroscopic level on left side though he undoubtedly has bilateral PVD    #1 Diabetic Foot Osteomyelitis:   He DID not want PICC and IV abx Therefore we went with levaquin and doxycline which would give MRSA, MSSA, strep coverage, Vibrio coverage (not anti-pseudomonal coverage at this dose but dont feel this is necessary)  Treat for  minimum of 6 weeks with THESE antibiotics then even more protracted course of these or other oral abx for at least 3 months if not longer    He NEEDS offloading scheme, foot wear for BOTH feet to ensure  that these DFI heal properly   Fortunately he does not smoke, DM needs to be optimally managed and pt CAPABLE of managing this himself.   He needs Medicaid plug in and he needs a visit to [facility]   Chronically he will need a podiatrist or other wound care specialist unless he will chronically be followed by Dr. [name]    #2 PVD: I suppose this is due to chronic uncontrolled DM though his A1c is NOT terribly high. I will also sent off labs for vasculitis workup with ANCA, ANA , cryoglobulins    #3 Screening: check HIV, Hep panel pending   I will arrange for HSFU with us in ID clini cin the next 2-3 weeks    LOS: 4 days   Ryan [name] 01/15/2014, 3:25 PM

## 2014-01-16 ENCOUNTER — Telehealth (HOSPITAL_BASED_OUTPATIENT_CLINIC_OR_DEPARTMENT_OTHER): Payer: Self-pay | Admitting: Emergency Medicine

## 2014-01-16 LAB — MPO/PR-3 (ANCA) ANTIBODIES
Myeloperoxidase Abs: <1
Serine Protease 3: <1

## 2014-01-16 LAB — ANA: Anti Nuclear Antibody(ANA): NEGATIVE

## 2014-01-16 NOTE — Progress Notes (Signed)
01/16/2014 A. Ferrero RNCM 1900pm EDCM received text from Jacobs EngineeringKristin liason for [facility] that patient is set up for visiting RN for wound care DM disease management with [facility].

## 2014-01-16 NOTE — Progress Notes (Signed)
01/16/2014 1611pm A. Ferrero RNCM Attempted to call patient fro follow up with phone number listed on face sheet, which is a disconnected number.  [facility] who reports that patient has been seen todaya and has received Insulin and all disbetic supplies.  No further EDCM needs at this time.

## 2014-01-16 NOTE — Progress Notes (Signed)
01/16/2014 A. Saidee Geremia RNCM 1637pm EDCM called Doy HutchingKristin liason for [facility] for [facility] up.  Spoke to CaldwellKristin at 1637pm who sated she was unable to contact patient due to disconnected phone number, but will continue to try to reach patient.

## 2014-01-16 NOTE — Progress Notes (Addendum)
Patient was discharged around 2100 pm.  There were discharge issues including no insurance that were not addressed earlier in the day as well as the patient did not have a ride.  Amy from Case Management was able to come assist patient with discharge resources since patient would not have been able to afford medications with no insurance.  Patient's friend came to pick him up around 9. Patient was assisted to car by nurse tech.  Patient's condition was stable and vitals WNL.  Patient's dressing was reinforced prior to being sent down.  Patient was also taught via the "teach back" method how to administer and draw up his own insulin.  Instructions were given regarding when to call the doctor (fever, bleeding through dressing,etc) and when he should be following up with infectious disease and surgeon.  Education regarding medication resources were provided by Amy.  Patient was also given instructions on medications he should be taking and how often.  Patient asked appropriate questions and demonstrated understanding of this information.  He was successfully able to draw up and administer insulin on his own.Kenton KingfisherMills, Deania Siguenza [place]

## 2014-01-16 NOTE — Progress Notes (Signed)
Cm called Roger listed in pt emergency room contact section and request he call pt for Cm to request he call [facility] at [phone] to provided a number other than the disconnect home number so [facility] can contact him prior to a home visit. [phone] prompt [phone] to see if pt had provided another number other than home number but he did not

## 2014-01-18 LAB — CULTURE, BLOOD (ROUTINE X 2)

## 2014-01-19 ENCOUNTER — Ambulatory Visit: Payer: Self-pay | Attending: Internal Medicine

## 2014-01-19 LAB — CULTURE, BLOOD (ROUTINE X 2)
Culture: NO GROWTH
Culture: NO GROWTH

## 2014-01-19 LAB — CRYOGLOBULIN

## 2014-01-20 ENCOUNTER — Other Ambulatory Visit: Payer: Self-pay | Admitting: Internal Medicine

## 2014-01-20 MED ORDER — INSULIN DETEMIR 100 UNIT/ML ~~LOC~~ SOLN: 10.0000 [IU] | Freq: Every day | SUBCUTANEOUS | Status: DC

## 2014-02-02 ENCOUNTER — Ambulatory Visit: Payer: Self-pay | Attending: Internal Medicine

## 2014-02-03 ENCOUNTER — Encounter: Payer: Self-pay | Admitting: Internal Medicine

## 2014-02-03 ENCOUNTER — Ambulatory Visit: Payer: Self-pay | Attending: Internal Medicine | Admitting: Internal Medicine

## 2014-02-03 VITALS — BP 111/70 | HR 76 | Temp 98.3°F | Resp 16 | Ht 71.0 in | Wt 165.0 lb

## 2014-02-03 DIAGNOSIS — E119 Type 2 diabetes mellitus without complications: Secondary | ICD-10-CM | POA: Insufficient documentation

## 2014-02-03 DIAGNOSIS — E1165 Type 2 diabetes mellitus with hyperglycemia: Secondary | ICD-10-CM

## 2014-02-03 DIAGNOSIS — R011 Cardiac murmur, unspecified: Secondary | ICD-10-CM

## 2014-02-03 DIAGNOSIS — I359 Nonrheumatic aortic valve disorder, unspecified: Secondary | ICD-10-CM | POA: Insufficient documentation

## 2014-02-03 DIAGNOSIS — I358 Other nonrheumatic aortic valve disorders: Secondary | ICD-10-CM | POA: Insufficient documentation

## 2014-02-03 LAB — GLUCOSE, POCT (MANUAL RESULT ENTRY): POC Glucose: 107 mg/dl — AB (ref 70–99)

## 2014-02-03 MED ORDER — METFORMIN HCL 500 MG PO TABS: 500.0000 mg | ORAL_TABLET | Freq: Two times a day (BID) | ORAL | Status: DC

## 2014-02-03 NOTE — Progress Notes (Signed)
HFU Pt is here following up after his right great toe amputation. Pt recently found out that he was diabetic.

## 2014-02-03 NOTE — Progress Notes (Signed)
Patient ID: Ryan PellantRonald R [name], male   DOB: 05/03/1961, 53 y.o.   MRN: [id number]   Ryan [name], is a 53 y.o. male  [id number]  NFA:[id number]  DOB - 05/03/1961  CC:  Chief Complaint  Patient presents with  . Hospitalization Follow-up       HPI: Ryan [name] is a 53 y.o. male here today to establish medical care. Patient was recently admitted for gangrene and osteomyelitis of the big toe of the right foot status post amputation and antibiotic treatment. He was recently diagnosed with diabetes mellitus in May 2015, he has been started on insulin Levemir and metformin. Patient claims compliant with medication, blood sugar has been in the range of 95-140 according to patient. He has no personal history of hypertension. His only other medication is antibiotics and iron supplement. He does not have history of heart disease. Patient does not smoke cigarette, drinks alcohol occasionally. He has appointment with infectious disease coming up tomorrow and to orthopedics surgery appointment in 2 days. Patient reports minimal discharge from the area where sutures have been removed. No foul smell Patient has No headache, No chest pain, No abdominal pain - No Nausea, No new weakness tingling or numbness, No Cough - SOB.  No Known Allergies Past Medical History  Diagnosis Date  . Diabetes mellitus without complication    Current Outpatient Prescriptions on File Prior to Visit  Medication Sig Dispense Refill  . Cyanocobalamin (B-12 PO) Take 1 tablet by mouth daily.      Marland Kitchen. doxycycline (VIBRA-TABS) 100 MG tablet Take 1 tablet (100 mg total) by mouth every 12 (twelve) hours.  84 tablet  0  . ferrous sulfate 325 (65 FE) MG tablet Take 1 tablet (325 mg total) by mouth 3 (three) times daily after meals.  90 tablet  1  . glucose blood (TRUETEST TEST) test strip Use as instructed  100 each  1  . insulin detemir (LEVEMIR) 100 UNIT/ML injection Inject 0.1 mLs (10 Units total) into the skin at bedtime.  4  vial  3  . Insulin Syringe-Needle U-100 30G X 5/16" 0.5 ML MISC 1 Syringe by Does not apply route daily.  30 each  1  . levofloxacin (LEVAQUIN) 500 MG tablet Take 1 tablet (500 mg total) by mouth daily at 12 noon.  42 tablet  0  . TRUEPLUS LANCETS 33G MISC 1 each by Does not apply route 3 (three) times daily before meals.  100 each  1  . UNABLE TO FIND Apply 1 application topically daily. Silver shield gel      . oxyCODONE (OXY IR/ROXICODONE) 5 MG immediate release tablet Take 1 tablet (5 mg total) by mouth every 4 (four) hours as needed for moderate pain.  15 tablet  0   No current facility-administered medications on file prior to visit.   History reviewed. No pertinent family history. History   Social History  . Marital Status: Single    Spouse Name: N/A    Number of Children: N/A  . Years of Education: N/A   Occupational History  . Not on file.   Social History Main Topics  . Smoking status: Never Smoker   . Smokeless tobacco: Not on file  . Alcohol Use: Yes     Comment: occasionally  . Drug Use: No  . Sexual Activity: Not on file   Other Topics Concern  . Not on file   Social History Narrative  . No narrative on file    Review of Systems: Constitutional:  Negative for fever, chills, diaphoresis, activity change, appetite change and fatigue. HENT: Negative for ear pain, nosebleeds, congestion, facial swelling, rhinorrhea, neck pain, neck stiffness and ear discharge.  Eyes: Negative for pain, discharge, redness, itching and visual disturbance. Respiratory: Negative for cough, choking, chest tightness, shortness of breath, wheezing and stridor.  Cardiovascular: Negative for chest pain, palpitations and leg swelling. Gastrointestinal: Negative for abdominal distention. Genitourinary: Negative for dysuria, urgency, frequency, hematuria, flank pain, decreased urine volume, difficulty urinating and dyspareunia.  Musculoskeletal: Negative for back pain, joint swelling,  arthralgia and gait problem. Neurological: Negative for dizziness, tremors, seizures, syncope, facial asymmetry, speech difficulty, weakness, light-headedness, numbness and headaches.  Hematological: Negative for adenopathy. Does not bruise/bleed easily. Psychiatric/Behavioral: Negative for hallucinations, behavioral problems, confusion, dysphoric mood, decreased concentration and agitation.    Objective:   Filed Vitals:   02/03/14 1446  BP: 111/70  Pulse: 76  Temp: 98.3 F (36.8 C)  Resp: 16    Physical Exam: Constitutional: Patient appears well-developed and well-nourished. No distress. HENT: Normocephalic, atraumatic, External right and left ear normal. Oropharynx is clear and moist.  Eyes: Conjunctivae and EOM are normal. PERRLA, no scleral icterus. Neck: Normal ROM. Neck supple. No JVD. No tracheal deviation. No thyromegaly. CVS: RRR, S1/S2 +, systolic murmurs at the aortic area, no gallops, no carotid bruit.  Pulmonary: Effort and breath sounds normal, no stridor, rhonchi, wheezes, rales.  Abdominal: Soft. BS +, no distension, tenderness, rebound or guarding.  Musculoskeletal: Normal range of motion. No edema and no tenderness. Right leg in orthopedics boot/cast, right foot and dressing, wound dressing is clean Lymphadenopathy: No lymphadenopathy noted, cervical, inguinal or axillary Neuro: Alert. Normal reflexes, muscle tone coordination. No cranial nerve deficit. Skin: Skin is warm and dry. No rash noted. Not diaphoretic. No erythema. No pallor. Psychiatric: Normal mood and affect. Behavior, judgment, thought content normal.  Lab Results  Component Value Date   WBC 10.9* 01/13/2014   HGB 11.8* 01/13/2014   HCT 37.9* 01/13/2014   MCV 98.2 01/13/2014   PLT 331 01/13/2014   Lab Results  Component Value Date   CREATININE 0.76 01/15/2014   BUN 8 01/15/2014   NA 139 01/15/2014   K 4.0 01/15/2014   CL 100 01/15/2014   CO2 25 01/15/2014    Lab Results  Component Value Date    HGBA1C 7.6* 01/12/2014   Lipid Panel  No results found for this basename: chol, trig, hdl, cholhdl, vldl, ldlcalc       Assessment and plan:   1. Type 2 diabetes mellitus with hyperglycemia  - Glucose (CBG) - metFORMIN (GLUCOPHAGE) 500 MG tablet; Take 1 tablet (500 mg total) by mouth 2 (two) times daily with a meal.  Dispense: 180 tablet; Refill: 3  Check - Lipid panel - TSH  2. Systolic murmur of aorta  - 2D Echocardiogram with contrast; Future  Patient was counseled extensively about nutrition and exercise  Return in about 3 months (around 05/06/2014), or if symptoms worsen or fail to improve, for Hemoglobin A1C and Follow up, DM, Follow up HTN.  The patient was given clear instructions to go to ER or return to medical center if symptoms don't improve, worsen or new problems develop. The patient verbalized understanding. The patient was told to call to get lab results if they haven't heard anything in the next week.     This note has been created with Education officer, environmentalDragon speech recognition software and smart phrase technology. Any transcriptional errors are unintentional.    Jeanann LewandowskyJEGEDE, Bo Rogue, MD,  MHA, Maxwell Caul [facility] [place], [place] [phone]   02/03/2014, 3:39 PM

## 2014-02-03 NOTE — Patient Instructions (Signed)
Diabetes and Exercise Exercising regularly is important. It is not just about losing weight. It has many health benefits, such as:  Improving your overall fitness, flexibility, and endurance.  Increasing your bone density.  Helping with weight control.  Decreasing your body fat.  Increasing your muscle strength.  Reducing stress and tension.  Improving your overall health. People with diabetes who exercise gain additional benefits because exercise:  Reduces appetite.  Improves the body's use of blood sugar (glucose).  Helps lower or control blood glucose.  Decreases blood pressure.  Helps control blood lipids (such as cholesterol and triglycerides).  Improves the body's use of the hormone insulin by:  Increasing the body's insulin sensitivity.  Reducing the body's insulin needs.  Decreases the risk for heart disease because exercising:  Lowers cholesterol and triglycerides levels.  Increases the levels of good cholesterol (such as high-density lipoproteins [HDL]) in the body.  Lowers blood glucose levels. YOUR ACTIVITY PLAN  Choose an activity that you enjoy and set realistic goals. Your health care provider or diabetes educator can help you make an activity plan that works for you. You can break activities into 2 or 3 sessions throughout the day. Doing so is as good as one long session. Exercise ideas include:  Taking the dog for a walk.  Taking the stairs instead of the elevator.  Dancing to your favorite song.  Doing your favorite exercise with a friend. RECOMMENDATIONS FOR EXERCISING WITH TYPE 1 OR TYPE 2 DIABETES   Check your blood glucose before exercising. If blood glucose levels are greater than 240 mg/dL, check for urine ketones. Do not exercise if ketones are present.  Avoid injecting insulin into areas of the body that are going to be exercised. For example, avoid injecting insulin into:  The arms when playing tennis.  The legs when  jogging.  Keep a record of:  Food intake before and after you exercise.  Expected peak times of insulin action.  Blood glucose levels before and after you exercise.  The type and amount of exercise you have done.  Review your records with your health care provider. Your health care provider will help you to develop guidelines for adjusting food intake and insulin amounts before and after exercising.  If you take insulin or oral hypoglycemic agents, watch for signs and symptoms of hypoglycemia. They include:  Dizziness.  Shaking.  Sweating.  Chills.  Confusion.  Drink plenty of water while you exercise to prevent dehydration or heat stroke. Body water is lost during exercise and must be replaced.  Talk to your health care provider before starting an exercise program to make sure it is safe for you. Remember, almost any type of activity is better than none. Document Released: 10/07/2003 Document Revised: 03/19/2013 Document Reviewed: 12/24/2012 ExitCare Patient Information 2015 [employer]. This information is not intended to replace advice given to you by your health care provider. Make sure you discuss any questions you have with your health care provider. Diabetes Mellitus and Food It is important for you to manage your blood sugar (glucose) level. Your blood glucose level can be greatly affected by what you eat. Eating healthier foods in the appropriate amounts throughout the day at about the same time each day will help you control your blood glucose level. It can also help slow or prevent worsening of your diabetes mellitus. Healthy eating may even help you improve the level of your blood pressure and reach or maintain a healthy weight.  HOW CAN FOOD AFFECT   ME? Carbohydrates Carbohydrates affect your blood glucose level more than any other type of food. Your dietitian will help you determine how many carbohydrates to eat at each meal and teach you how to count  carbohydrates. Counting carbohydrates is important to keep your blood glucose at a healthy level, especially if you are using insulin or taking certain medicines for diabetes mellitus. Alcohol Alcohol can cause sudden decreases in blood glucose (hypoglycemia), especially if you use insulin or take certain medicines for diabetes mellitus. Hypoglycemia can be a life-threatening condition. Symptoms of hypoglycemia (sleepiness, dizziness, and disorientation) are similar to symptoms of having too much alcohol.  If your health care provider has given you approval to drink alcohol, do so in moderation and use the following guidelines:  Women should not have more than one drink per day, and men should not have more than two drinks per day. One drink is equal to:  12 oz of beer.  5 oz of wine.  1 oz of hard liquor.  Do not drink on an empty stomach.  Keep yourself hydrated. Have water, diet soda, or unsweetened iced tea.  Regular soda, juice, and other mixers might contain a lot of carbohydrates and should be counted. WHAT FOODS ARE NOT RECOMMENDED? As you make food choices, it is important to remember that all foods are not the same. Some foods have fewer nutrients per serving than other foods, even though they might have the same number of calories or carbohydrates. It is difficult to get your body what it needs when you eat foods with fewer nutrients. Examples of foods that you should avoid that are high in calories and carbohydrates but low in nutrients include:  Trans fats (most processed foods list trans fats on the Nutrition Facts label).  Regular soda.  Juice.  Candy.  Sweets, such as cake, pie, doughnuts, and cookies.  Fried foods. WHAT FOODS CAN I EAT? Have nutrient-rich foods, which will nourish your body and keep you healthy. The food you should eat also will depend on several factors, including:  The calories you need.  The medicines you take.  Your weight.  Your blood  glucose level.  Your blood pressure level.  Your cholesterol level. You also should eat a variety of foods, including:  Protein, such as meat, poultry, fish, tofu, nuts, and seeds (lean animal proteins are best).  Fruits.  Vegetables.  Dairy products, such as milk, cheese, and yogurt (low fat is best).  Breads, grains, pasta, cereal, rice, and beans.  Fats such as olive oil, trans fat-free margarine, canola oil, avocado, and olives. DOES EVERYONE WITH DIABETES MELLITUS HAVE THE SAME MEAL PLAN? Because every person with diabetes mellitus is different, there is not one meal plan that works for everyone. It is very important that you meet with a dietitian who will help you create a meal plan that is just right for you. Document Released: 04/13/2005 Document Revised: 07/22/2013 Document Reviewed: 06/13/2013 ExitCare Patient Information 2015 [employer]. This information is not intended to replace advice given to you by your health care provider. Make sure you discuss any questions you have with your health care provider.  

## 2014-02-04 ENCOUNTER — Ambulatory Visit (INDEPENDENT_AMBULATORY_CARE_PROVIDER_SITE_OTHER): Payer: Self-pay | Admitting: Internal Medicine

## 2014-02-04 ENCOUNTER — Encounter: Payer: Self-pay | Admitting: Internal Medicine

## 2014-02-04 VITALS — BP 133/83 | HR 103 | Temp 98.2°F | Wt 167.0 lb

## 2014-02-04 DIAGNOSIS — L089 Local infection of the skin and subcutaneous tissue, unspecified: Secondary | ICD-10-CM

## 2014-02-04 DIAGNOSIS — E11628 Type 2 diabetes mellitus with other skin complications: Secondary | ICD-10-CM

## 2014-02-04 DIAGNOSIS — E1169 Type 2 diabetes mellitus with other specified complication: Secondary | ICD-10-CM

## 2014-02-04 LAB — LIPID PANEL
CHOLESTEROL: 142 mg/dL (ref 0–200)
HDL: 37 mg/dL — ABNORMAL LOW (ref >39)
LDL Cholesterol: 85 mg/dL (ref 0–99)
Total CHOL/HDL Ratio: 3.8 Ratio
Triglycerides: 100 mg/dL (ref <150)
VLDL: 20 mg/dL (ref 0–40)

## 2014-02-04 LAB — TSH: TSH: 0.974 u[IU]/mL (ref 0.350–4.500)

## 2014-02-04 MED ORDER — LEVOFLOXACIN 500 MG PO TABS: 500.0000 mg | ORAL_TABLET | Freq: Every day | ORAL | Status: DC

## 2014-02-04 MED ORDER — DOXYCYCLINE HYCLATE 100 MG PO TABS: 100.0000 mg | ORAL_TABLET | Freq: Two times a day (BID) | ORAL | Status: DC

## 2014-02-04 NOTE — Progress Notes (Signed)
Patient ID: Ryan PellantRonald R [name], male   DOB: 10/30/1960, 53 y.o.   MRN: [id number]         [facility]  Patient Active Problem List   Diagnosis Date Noted  . Systolic murmur of aorta 02/03/2014  . Gram-positive bacteremia 01/13/2014  . Osteomyelitis of right foot 01/13/2014  . Osteomyelitis of foot, right, acute 01/13/2014  . Hyponatremia 01/12/2014  . Leukocytosis, unspecified 01/12/2014  . Diabetes mellitus 01/11/2014  . 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 Osteomyelitis 01/11/2014    Patient's Medications  New Prescriptions   No medications on file  Previous Medications   CYANOCOBALAMIN (B-12 PO)    Take 1 tablet by mouth daily.   DOXYCYCLINE (VIBRA-TABS) 100 MG TABLET    Take 1 tablet (100 mg total) by mouth every 12 (twelve) hours.   FERROUS SULFATE 325 (65 FE) MG TABLET    Take 1 tablet (325 mg total) by mouth 3 (three) times daily after meals.   GLUCOSE BLOOD (TRUETEST TEST) TEST STRIP    Use as instructed   INSULIN DETEMIR (LEVEMIR) 100 UNIT/ML INJECTION    Inject 0.1 mLs (10 Units total) into the skin at bedtime.   INSULIN SYRINGE-NEEDLE U-100 30G X 5/16" 0.5 ML MISC    1 Syringe by Does not apply route daily.   LEVOFLOXACIN (LEVAQUIN) 500 MG TABLET    Take 1 tablet (500 mg total) by mouth daily at 12 noon.   METFORMIN (GLUCOPHAGE) 500 MG TABLET    Take 1 tablet (500 mg total) by mouth 2 (two) times daily with a meal.   OXYCODONE (OXY IR/ROXICODONE) 5 MG IMMEDIATE RELEASE TABLET    Take 1 tablet (5 mg total) by mouth every 4 (four) hours as needed for moderate pain.   TRUEPLUS LANCETS 33G MISC    1 each by Does not apply route 3 (three) times daily before meals.   UNABLE TO FIND    Apply 1 application topically daily. Silver shield gel  Modified Medications   No medications on file  Discontinued Medications   No medications on file    Subjective: Ryan [name] is him for his hospital followup visit. He is a newly diagnosed  diabetic who was hospitalized last month with bilateral foot infections. His MRI raised the concern about bilateral osteomyelitis. 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 He was treated with IV antibiotics while hospitalized but was very reluctant to have a PICC placed for outpatient IV antibiotics. No cultures were available to guide therapy so he was discharged on empiric oral levofloxacin and doxycycline. He had no trouble getting his medications. He is feeling better and tolerating his antibiotics well. It sounds like his blood sugars have been well controlled recently. Review of Systems: Pertinent items are noted in HPI.  Past Medical History  Diagnosis Date  . Diabetes mellitus without complication     History  Substance Use Topics  . Smoking status: Never Smoker   . Smokeless tobacco: Not on file  . Alcohol Use: Yes     Comment: occasionally    No family history on file.  No Known Allergies  Objective: Temp: 98.2 F (36.8 C) (07/08 1342) Temp src: Oral (07/08 1342) BP: 133/83 mmHg (07/08 1342) Pulse Rate: 103 (07/08 1342)  General: He is in good spirits and in no distress Right foot: The surgical incision is healing well. There is no cellulitis or fluctuance. Left foot: The scabbed lesions on the tip  of his great toe on the dorsum of his foot are healing.  Sed Rate (mm/hr)  Date Value  01/15/2014 105*  01/12/2014 88*     CRP (mg/dL)  Date Value  1/61/09606/18/2015 9.6*    Assessment: He is improving on empiric therapy for diabetic foot infection complicated by osteomyelitis.  Plan: 1. Continue current antibiotics. He will need a minimum of 6 weeks of therapy and probably even longer than that. 2. Followup in 4 weeks   Cliffton AstersJohn Haleemah Buckalew, MD [facility] [phone] pager   [phone] cell 02/04/2014, 2:21 PM

## 2014-02-13 ENCOUNTER — Ambulatory Visit: Payer: Self-pay | Admitting: Family Medicine

## 2014-02-13 NOTE — Telephone Encounter (Signed)
Left message with pt and told him that his labs were normal and if he had any questions then to return my call.

## 2014-02-17 ENCOUNTER — Ambulatory Visit (HOSPITAL_COMMUNITY)
Admission: RE | Admit: 2014-02-17 | Discharge: 2014-02-17 | Disposition: A | Discharge status: Home / Self Care | Payer: Self-pay | Source: Ambulatory Visit | Attending: Internal Medicine | Admitting: Internal Medicine

## 2014-02-17 DIAGNOSIS — R011 Cardiac murmur, unspecified: Secondary | ICD-10-CM

## 2014-02-17 DIAGNOSIS — I358 Other nonrheumatic aortic valve disorders: Secondary | ICD-10-CM

## 2014-02-17 NOTE — Progress Notes (Signed)
*  PRELIMINARY RESULTS* Echocardiogram 2D Echocardiogram has been performed.  Jeryl ColumbiaLLIOTT, Malakhai Beitler 02/17/2014, 1:48 PM

## 2014-02-18 ENCOUNTER — Telehealth: Payer: Self-pay | Admitting: *Deleted

## 2014-02-18 NOTE — Telephone Encounter (Signed)
Pt is aware of his echocardiogram results.

## 2014-02-18 NOTE — Telephone Encounter (Signed)
Message copied by Raynelle CharyWINFREE, Aryiah Monterosso R on Wed Feb 18, 2014  9:41 AM ------      Message from: Quentin AngstJEGEDE, OLUGBEMIGA E      Created: Tue Feb 17, 2014  5:57 PM       Please inform patient that his echocardiogram/cardiac ultrasound is normal ------

## 2014-03-04 ENCOUNTER — Ambulatory Visit: Payer: Self-pay | Admitting: Internal Medicine

## 2014-03-24 ENCOUNTER — Other Ambulatory Visit: Payer: Self-pay | Admitting: *Deleted

## 2014-03-24 ENCOUNTER — Ambulatory Visit (INDEPENDENT_AMBULATORY_CARE_PROVIDER_SITE_OTHER): Payer: Self-pay | Admitting: Internal Medicine

## 2014-03-24 ENCOUNTER — Encounter: Payer: Self-pay | Admitting: Internal Medicine

## 2014-03-24 VITALS — BP 172/91 | HR 99 | Temp 98.5°F | Ht 71.0 in | Wt 163.5 lb

## 2014-03-24 DIAGNOSIS — M86179 Other acute osteomyelitis, unspecified ankle and foot: Secondary | ICD-10-CM

## 2014-03-24 DIAGNOSIS — L089 Local infection of the skin and subcutaneous tissue, unspecified: Principal | ICD-10-CM

## 2014-03-24 DIAGNOSIS — M86171 Other acute osteomyelitis, right ankle and foot: Secondary | ICD-10-CM

## 2014-03-24 DIAGNOSIS — E11628 Type 2 diabetes mellitus with other skin complications: Secondary | ICD-10-CM

## 2014-03-24 MED ORDER — DOXYCYCLINE HYCLATE 100 MG PO TABS: 100.0000 mg | ORAL_TABLET | Freq: Two times a day (BID) | ORAL | Status: DC

## 2014-03-24 MED ORDER — LEVOFLOXACIN 500 MG PO TABS: 500.0000 mg | ORAL_TABLET | Freq: Every day | ORAL | Status: DC

## 2014-03-24 NOTE — Progress Notes (Signed)
Patient ID: Ryan [name], male   DOB: 1961/04/22, 53 y.o.   MRN: [id number]         [facility]  Patient Active Problem List   Diagnosis Date Noted  . Systolic murmur of aorta 02/03/2014  . Gram-positive bacteremia 01/13/2014  . Osteomyelitis of right foot 01/13/2014  . Osteomyelitis of foot, right, acute 01/13/2014  . Hyponatremia 01/12/2014  . Leukocytosis, unspecified 01/12/2014  . Diabetes mellitus 01/11/2014  . 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 Osteomyelitis 01/11/2014    Patient's Medications  New Prescriptions   No medications on file  Previous Medications   CYANOCOBALAMIN (B-12 PO)    Take 1 tablet by mouth daily.   FERROUS SULFATE 325 (65 FE) MG TABLET    Take 1 tablet (325 mg total) by mouth 3 (three) times daily after meals.   GLUCOSE BLOOD (TRUETEST TEST) TEST STRIP    Use as instructed   INSULIN DETEMIR (LEVEMIR) 100 UNIT/ML INJECTION    Inject 0.1 mLs (10 Units total) into the skin at bedtime.   INSULIN SYRINGE-NEEDLE U-100 30G X 5/16" 0.5 ML MISC    1 Syringe by Does not apply route daily.   OXYCODONE (OXY IR/ROXICODONE) 5 MG IMMEDIATE RELEASE TABLET    Take 1 tablet (5 mg total) by mouth every 4 (four) hours as needed for moderate pain.   TRUEPLUS LANCETS 33G MISC    1 each by Does not apply route 3 (three) times daily before meals.   UNABLE TO FIND    Apply 1 application topically daily. Silver shield gel  Modified Medications   Modified Medication Previous Medication   DOXYCYCLINE (VIBRA-TABS) 100 MG TABLET doxycycline (VIBRA-TABS) 100 MG tablet      Take 1 tablet (100 mg total) by mouth every 12 (twelve) hours.    Take 1 tablet (100 mg total) by mouth every 12 (twelve) hours.   LEVOFLOXACIN (LEVAQUIN) 500 MG TABLET levofloxacin (LEVAQUIN) 500 MG tablet      Take 1 tablet (500 mg total) by mouth daily at 12 noon.    Take 1 tablet (500 mg total) by mouth daily at 12 noon.   METFORMIN (GLUCOPHAGE) 500 MG  TABLET metFORMIN (GLUCOPHAGE) 500 MG tablet      Take 500 mg by mouth daily.    Take 1 tablet (500 mg total) by mouth 2 (two) times daily with a meal.  Discontinued Medications   DOXYCYCLINE (VIBRA-TABS) 100 MG TABLET    Take 1 tablet (100 mg total) by mouth every 12 (twelve) hours.   LEVOFLOXACIN (LEVAQUIN) 500 MG TABLET    Take 1 tablet (500 mg total) by mouth daily at 12 noon.    Subjective: Ryan [name] is in for his followup visit. He states that he is doing better. He continues to followup with Dr. Darrelyn Hillock every 2 weeks. He states that his right foot wound is healing slowly from the bottom up. He's had no problems tolerating his levofloxacin or doxycycline. He is now been on antibiotic therapy for 2 months. He remains out of work. Review of Systems: Pertinent items are noted in HPI.  Past Medical History  Diagnosis Date  . Diabetes mellitus without complication     History  Substance Use Topics  . Smoking status: Never Smoker   . Smokeless tobacco: Not on file  . Alcohol Use: Yes     Comment: occasionally    No family history on file.  No Known Allergies  Objective: Temp: 98.5  F (36.9 C) (08/25 1603) Temp src: Oral (08/25 1603) BP: 172/91 mmHg (08/25 1603) Pulse Rate: 99 (08/25 1603)  General: He is in good spirits and in no distress Right foot wounds are much smaller. There is yellow exudate on the open wounds and some yellow drainage on his dressing. There is no surrounding cellulitis or fluctuance. His foot is warm and well perfused  Sed Rate (mm/hr)  Date Value  01/15/2014 105*  01/12/2014 88*     CRP (mg/dL)  Date Value  1/61/0960 9.6*   Assessment: His diabetic foot infection with osteomyelitis is improving slowly. I will repeat his inflammatory markers, continue his current antibiotics and see him back in a few weeks.  Plan: 1. Continue antibiotics for now 2. Repeat inflammatory markers 3. Followup in 3 weeks   Cliffton Asters, MD [facility] [phone] pager   [phone] cell 03/24/2014, 4:55 PM

## 2014-03-25 LAB — C-REACTIVE PROTEIN: CRP: 7.1 mg/dL — AB (ref <0.60)

## 2014-03-25 LAB — SEDIMENTATION RATE: SED RATE: 82 mm/h — AB (ref 0–16)

## 2014-04-14 ENCOUNTER — Encounter: Payer: Self-pay | Admitting: Internal Medicine

## 2014-04-14 ENCOUNTER — Ambulatory Visit (INDEPENDENT_AMBULATORY_CARE_PROVIDER_SITE_OTHER): Payer: Self-pay | Admitting: Internal Medicine

## 2014-04-14 VITALS — BP 162/92 | HR 86 | Temp 97.7°F | Ht 71.0 in | Wt 157.0 lb

## 2014-04-14 DIAGNOSIS — E1169 Type 2 diabetes mellitus with other specified complication: Secondary | ICD-10-CM

## 2014-04-14 DIAGNOSIS — L089 Local infection of the skin and subcutaneous tissue, unspecified: Secondary | ICD-10-CM

## 2014-04-14 DIAGNOSIS — M86171 Other acute osteomyelitis, right ankle and foot: Secondary | ICD-10-CM

## 2014-04-14 DIAGNOSIS — Z23 Encounter for immunization: Secondary | ICD-10-CM

## 2014-04-14 DIAGNOSIS — M86179 Other acute osteomyelitis, unspecified ankle and foot: Secondary | ICD-10-CM

## 2014-04-14 DIAGNOSIS — E11628 Type 2 diabetes mellitus with other skin complications: Secondary | ICD-10-CM

## 2014-04-14 NOTE — Progress Notes (Signed)
Patient ID: Ryan [name], male   DOB: Jan 30, 1961, 53 y.o.   MRN: [id number]         [facility] for Infectious Disease  Patient Active Problem List   Diagnosis Date Noted  . Systolic murmur of aorta 02/03/2014  . Gram-positive bacteremia 01/13/2014  . Osteomyelitis of right foot 01/13/2014  . Osteomyelitis of foot, right, acute 01/13/2014  . Hyponatremia 01/12/2014  . Leukocytosis, unspecified 01/12/2014  . Diabetes mellitus 01/11/2014  . 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 Osteomyelitis 01/11/2014    Patient's Medications  New Prescriptions   No medications on file  Previous Medications   CYANOCOBALAMIN (B-12 PO)    Take 1 tablet by mouth daily.   FERROUS SULFATE 325 (65 FE) MG TABLET    Take 1 tablet (325 mg total) by mouth 3 (three) times daily after meals.   GLUCOSE BLOOD (TRUETEST TEST) TEST STRIP    Use as instructed   INSULIN DETEMIR (LEVEMIR) 100 UNIT/ML INJECTION    Inject 0.1 mLs (10 Units total) into the skin at bedtime.   INSULIN SYRINGE-NEEDLE U-100 30G X 5/16" 0.5 ML MISC    1 Syringe by Does not apply route daily.   METFORMIN (GLUCOPHAGE) 500 MG TABLET    Take 500 mg by mouth daily.   TRUEPLUS LANCETS 33G MISC    1 each by Does not apply route 3 (three) times daily before meals.   UNABLE TO FIND    Apply 1 application topically daily. Silver shield gel  Modified Medications   No medications on file  Discontinued Medications   DOXYCYCLINE (VIBRA-TABS) 100 MG TABLET    Take 1 tablet (100 mg total) by mouth every 12 (twelve) hours.   LEVOFLOXACIN (LEVAQUIN) 500 MG TABLET    Take 1 tablet (500 mg total) by mouth daily at 12 noon.   OXYCODONE (OXY IR/ROXICODONE) 5 MG IMMEDIATE RELEASE TABLET    Take 1 tablet (5 mg total) by mouth every 4 (four) hours as needed for moderate pain.    Subjective: Ryan [name] is in for his routine visit. When he went to the pharmacy 3 weeks ago they only gave him a days worth of his levofloxacin and  doxycycline rather than the 6 weeks of therapy that was ordered. He has been off of antibiotics for the past 2 weeks. He states that his right foot wound continues to improve. The wound is getting smaller. He states his blood sugars have been under good control recently. Review of Systems: Pertinent items are noted in HPI.  Past Medical History  Diagnosis Date  . Diabetes mellitus without complication     History  Substance Use Topics  . Smoking status: Never Smoker   . Smokeless tobacco: Not on file  . Alcohol Use: No     Comment: occasionally    No family history on file.  No Known Allergies  Objective: Temp: 97.7 F (36.5 C) (09/15 1010) Temp src: Oral (09/15 1010) BP: 162/92 mmHg (09/15 1010) Pulse Rate: 86 (09/15 1010)  General: He is in good spirits Right foot: His great toe is surgically absent. He has 2 small open areas in his surgical wound. They have decreased in size since his last visit.   Assessment: He received over 2 months of antibiotic therapy for his diabetic foot infection. I will have him continue observation off of antibiotics for now.  Plan: 1. Observe off of antibiotics 2. Influenza vaccination today 3. Followup in 6 weeks   Ryan [name]  Orvan Falconer, MD [facility] for Infectious Disease [facility] [phone] pager   [phone] cell 04/14/2014, 10:36 AM

## 2014-05-07 ENCOUNTER — Ambulatory Visit: Payer: Self-pay | Attending: Internal Medicine | Admitting: Internal Medicine

## 2014-05-07 ENCOUNTER — Encounter: Payer: Self-pay | Admitting: Internal Medicine

## 2014-05-07 VITALS — BP 140/68 | HR 76 | Temp 97.7°F | Resp 16 | Ht 71.0 in | Wt 167.0 lb

## 2014-05-07 DIAGNOSIS — E118 Type 2 diabetes mellitus with unspecified complications: Secondary | ICD-10-CM | POA: Insufficient documentation

## 2014-05-07 DIAGNOSIS — M869 Osteomyelitis, unspecified: Secondary | ICD-10-CM | POA: Insufficient documentation

## 2014-05-07 DIAGNOSIS — Z794 Long term (current) use of insulin: Secondary | ICD-10-CM | POA: Insufficient documentation

## 2014-05-07 DIAGNOSIS — E119 Type 2 diabetes mellitus without complications: Secondary | ICD-10-CM

## 2014-05-07 DIAGNOSIS — Z89411 Acquired absence of right great toe: Secondary | ICD-10-CM | POA: Insufficient documentation

## 2014-05-07 DIAGNOSIS — E1169 Type 2 diabetes mellitus with other specified complication: Secondary | ICD-10-CM | POA: Insufficient documentation

## 2014-05-07 DIAGNOSIS — I10 Essential (primary) hypertension: Secondary | ICD-10-CM | POA: Insufficient documentation

## 2014-05-07 DIAGNOSIS — R030 Elevated blood-pressure reading, without diagnosis of hypertension: Secondary | ICD-10-CM | POA: Insufficient documentation

## 2014-05-07 DIAGNOSIS — E11621 Type 2 diabetes mellitus with foot ulcer: Secondary | ICD-10-CM

## 2014-05-07 LAB — POCT GLYCOSYLATED HEMOGLOBIN (HGB A1C): Hemoglobin A1C: 6.9

## 2014-05-07 LAB — GLUCOSE, POCT (MANUAL RESULT ENTRY): POC Glucose: 159 mg/dl — AB (ref 70–99)

## 2014-05-07 MED ORDER — "INSULIN SYRINGE-NEEDLE U-100 30G X 5/16"" 0.5 ML MISC": 1.0000 | Freq: Every day | Status: DC

## 2014-05-07 MED ORDER — GLUCOSE BLOOD VI STRP: ORAL_STRIP | Status: DC

## 2014-05-07 NOTE — Progress Notes (Signed)
Pt is here following up on his diabetes. 

## 2014-05-07 NOTE — Progress Notes (Signed)
Patient ID: HOLMES HAYS, male   DOB: [birth year], 53 y.o.   MRN: [id number]   Ryan [name], is a 53 y.o. male  GMW:[id number]  [id number]  DOB - [birth year]  Chief Complaint  Patient presents with  . Follow-up        Subjective:   Ryan [name] is a 53 y.o. male here today for a follow up visit of DM.  PMH significant for DM with osetomyelitis and amputation of R great toe.  He reports that he stopped taking his metformin several months ago because it was "making sleepy."  He has not been checking his blood sugars recently as he has run out of strips.  He reports that his BP was elevated at his ID appt 9/15.  He feels this is related to increased sodium consumption.  He is followed by ID and surgery for his amputation.  He reports it is healing nicely with minimal drainage.  He denies headache, chest pain, abdominal pain, nausea, new weakness tingling or numbness, cough, and SOB.  He does not smoke, drinks alcohol occasionally.    Problem  Htn (Hypertension)    ALLERGIES: No Known Allergies  PAST MEDICAL HISTORY: Past Medical History  Diagnosis Date  . Diabetes mellitus without complication     MEDICATIONS AT HOME: Prior to Admission medications   Medication Sig Start Date End Date Taking? Authorizing Provider  ferrous sulfate 325 (65 FE) MG tablet Take 1 tablet (325 mg total) by mouth 3 (three) times daily after meals. 01/15/14  Yes Costin Otelia Sergeant, MD  glucose blood (TRUETEST TEST) test strip Use as instructed 01/15/14  Yes Costin Otelia Sergeant, MD  insulin detemir (LEVEMIR) 100 UNIT/ML injection Inject 0.1 mLs (10 Units total) into the skin at bedtime. 01/20/14  Yes Quentin Angst, MD  Insulin Syringe-Needle U-100 30G X 5/16" 0.5 ML MISC 1 Syringe by Does not apply route daily. 01/15/14  Yes Costin Otelia Sergeant, MD  TRUEPLUS LANCETS 33G MISC 1 each by Does not apply route 3 (three) times daily before meals. 01/15/14  Yes Costin Otelia Sergeant, MD  Cyanocobalamin (B-12 PO) Take  1 tablet by mouth daily.    Historical Provider, MD  metFORMIN (GLUCOPHAGE) 500 MG tablet Take 500 mg by mouth daily. 02/03/14   Quentin Angst, MD  UNABLE TO FIND Apply 1 application topically daily. Silver shield gel    Historical Provider, MD   Review of Systems  Constitutional: Negative.   HENT: Negative.   Eyes: Negative.   Respiratory: Negative.   Cardiovascular: Negative.   Gastrointestinal: Negative.   Genitourinary: Negative.   Musculoskeletal: Negative.   Skin: Negative.   Neurological: Negative.   Endo/Heme/Allergies: Negative.   Psychiatric/Behavioral: Negative.       Objective:   Filed Vitals:   05/07/14 1057  BP: 180/90  Pulse: 76  Temp: 97.7 F (36.5 C)  TempSrc: Oral  Resp: 16  Height: 5\' 11"  (1.803 m)  Weight: 167 lb (75.751 kg)  SpO2: 100%    Exam General appearance : Awake, alert, not in any distress. Speech Clear. Not toxic looking HEENT: Atraumatic and Normocephalic, pupils equally reactive to light and accomodation Neck: supple, no JVD. No cervical lymphadenopathy.  Chest:Good air entry bilaterally, no added sounds  CVS: S1 S2 regular, no murmurs.  Abdomen: Bowel sounds present, Non tender and not distended with no gaurding, rigidity or rebound. Extremities: B/L Lower Ext shows no edema, both legs are warm to touch. Right great toe amputated.  Neurology: Awake alert,  and oriented X 3, CN II-XII intact, Non focal Skin:No Rash Wounds:R great toe amputation site with min yellow drainage, no odor.  Minimal erythema. No areas of induration or swelling.  Data Review Lab Results  Component Value Date   HGBA1C 6.9 05/07/2014   HGBA1C 7.6* 01/12/2014     Assessment & Plan   1. Type 2 diabetes mellitus without complication  - Glucose (CBG) - HgB A1c A1c down from 7.6 to 6.9. Continue current regimen. Reorder glucose test strips   F/U 3 months for A1C   Aim for 2-3 Carb Choices per meal (30-45 grams) +/- 1 either way  Aim for 0-15 Carbs  per snack if hungry  Include protein in moderation with your meals and snacks  Consider reading food labels for Total Carbohydrate and Fat Grams of foods  Consider checking BG at alternate times per day  Continue taking medication as directed Fruit Punch - find one with no sugar  Measure and decrease portions of carbohydrate foods  Make your plate and don't go back for seconds   2. Elevated BP  The patient feels that a large contributing factor is his increased sodium intake.  He would like to try diet modification and f/u in 2 weeks with RN visit for BP check.  If at this time his BP remains elevated, consider lisinopril for renal protection.   F/U: 2 weeks for BP check. 3 months for DM, A1C  - We have discussed target BP range and blood pressure goal - I have advised patient to check BP regularly and to call us back or report to clinic if the numbers are consistently higher than 140/90  - We discussed the importance of compliance with medical therapy and DASH diet recommended, consequences of uncontrolled hypertension discussed.  - continue current BP medications  The patient was given clear instructions to go to ER or return to medical center if symptoms don't improve, worsen or new problems develop. The patient verbalized understanding. The patient was told to call to get lab results if they haven't heard anything in the next week.   Kariya Lavergne, FNP-student  Evaluation and management procedures were performed by the Advanced Practitioner under my supervision and collaboration. I have reviewed the Advanced Practitioner's note and chart, and I agree with the management and plan.   Jeanann LewandowskyJEGEDE, OLUGBEMIGA, MD, MHA, FACP, FAAP [facility] [place], [place] [phone]  05/07/2014, 11:35 AM

## 2014-05-07 NOTE — Patient Instructions (Signed)
Diabetes Mellitus and Food It is important for you to manage your blood sugar (glucose) level. Your blood glucose level can be greatly affected by what you eat. Eating healthier foods in the appropriate amounts throughout the day at about the same time each day will help you control your blood glucose level. It can also help slow or prevent worsening of your diabetes mellitus. Healthy eating may even help you improve the level of your blood pressure and reach or maintain a healthy weight.  HOW CAN FOOD AFFECT ME? Carbohydrates Carbohydrates affect your blood glucose level more than any other type of food. Your dietitian will help you determine how many carbohydrates to eat at each meal and teach you how to count carbohydrates. Counting carbohydrates is important to keep your blood glucose at a healthy level, especially if you are using insulin or taking certain medicines for diabetes mellitus. Alcohol Alcohol can cause sudden decreases in blood glucose (hypoglycemia), especially if you use insulin or take certain medicines for diabetes mellitus. Hypoglycemia can be a life-threatening condition. Symptoms of hypoglycemia (sleepiness, dizziness, and disorientation) are similar to symptoms of having too much alcohol.  If your health care provider has given you approval to drink alcohol, do so in moderation and use the following guidelines:  Women should not have more than one drink per day, and men should not have more than two drinks per day. One drink is equal to:  12 oz of beer.  5 oz of wine.  1 oz of hard liquor.  Do not drink on an empty stomach.  Keep yourself hydrated. Have water, diet soda, or unsweetened iced tea.  Regular soda, juice, and other mixers might contain a lot of carbohydrates and should be counted. WHAT FOODS ARE NOT RECOMMENDED? As you make food choices, it is important to remember that all foods are not the same. Some foods have fewer nutrients per serving than other  foods, even though they might have the same number of calories or carbohydrates. It is difficult to get your body what it needs when you eat foods with fewer nutrients. Examples of foods that you should avoid that are high in calories and carbohydrates but low in nutrients include:  Trans fats (most processed foods list trans fats on the Nutrition Facts label).  Regular soda.  Juice.  Candy.  Sweets, such as cake, pie, doughnuts, and cookies.  Fried foods. WHAT FOODS CAN I EAT? Have nutrient-rich foods, which will nourish your body and keep you healthy. The food you should eat also will depend on several factors, including:  The calories you need.  The medicines you take.  Your weight.  Your blood glucose level.  Your blood pressure level.  Your cholesterol level. You also should eat a variety of foods, including:  Protein, such as meat, poultry, fish, tofu, nuts, and seeds (lean animal proteins are best).  Fruits.  Vegetables.  Dairy products, such as milk, cheese, and yogurt (low fat is best).  Breads, grains, pasta, cereal, rice, and beans.  Fats such as olive oil, trans fat-free margarine, canola oil, avocado, and olives. DOES EVERYONE WITH DIABETES MELLITUS HAVE THE SAME MEAL PLAN? Because every person with diabetes mellitus is different, there is not one meal plan that works for everyone. It is very important that you meet with a dietitian who will help you create a meal plan that is just right for you. Document Released: 04/13/2005 Document Revised: 07/22/2013 Document Reviewed: 06/13/2013 ExitCare Patient Information 2015 [employer]. This   information is not intended to replace advice given to you by your health care provider. Make sure you discuss any questions you have with your health care provider. Diabetes and Exercise Exercising regularly is important. It is not just about losing weight. It has many health benefits, such as:  Improving your overall  fitness, flexibility, and endurance.  Increasing your bone density.  Helping with weight control.  Decreasing your body fat.  Increasing your muscle strength.  Reducing stress and tension.  Improving your overall health. People with diabetes who exercise gain additional benefits because exercise:  Reduces appetite.  Improves the body's use of blood sugar (glucose).  Helps lower or control blood glucose.  Decreases blood pressure.  Helps control blood lipids (such as cholesterol and triglycerides).  Improves the body's use of the hormone insulin by:  Increasing the body's insulin sensitivity.  Reducing the body's insulin needs.  Decreases the risk for heart disease because exercising:  Lowers cholesterol and triglycerides levels.  Increases the levels of good cholesterol (such as high-density lipoproteins [HDL]) in the body.  Lowers blood glucose levels. YOUR ACTIVITY PLAN  Choose an activity that you enjoy and set realistic goals. Your health care provider or diabetes educator can help you make an activity plan that works for you. Exercise regularly as directed by your health care provider. This includes:  Performing resistance training twice a week such as push-ups, sit-ups, lifting weights, or using resistance bands.  Performing 150 minutes of cardio exercises each week such as walking, running, or playing sports.  Staying active and spending no more than 90 minutes at one time being inactive. Even short bursts of exercise are good for you. Three 10-minute sessions spread throughout the day are just as beneficial as a single 30-minute session. Some exercise ideas include:  Taking the dog for a walk.  Taking the stairs instead of the elevator.  Dancing to your favorite song.  Doing an exercise video.  Doing your favorite exercise with a friend. RECOMMENDATIONS FOR EXERCISING WITH TYPE 1 OR TYPE 2 DIABETES   Check your blood glucose before exercising. If  blood glucose levels are greater than 240 mg/dL, check for urine ketones. Do not exercise if ketones are present.  Avoid injecting insulin into areas of the body that are going to be exercised. For example, avoid injecting insulin into:  The arms when playing tennis.  The legs when jogging.  Keep a record of:  Food intake before and after you exercise.  Expected peak times of insulin action.  Blood glucose levels before and after you exercise.  The type and amount of exercise you have done.  Review your records with your health care provider. Your health care provider will help you to develop guidelines for adjusting food intake and insulin amounts before and after exercising.  If you take insulin or oral hypoglycemic agents, watch for signs and symptoms of hypoglycemia. They include:  Dizziness.  Shaking.  Sweating.  Chills.  Confusion.  Drink plenty of water while you exercise to prevent dehydration or heat stroke. Body water is lost during exercise and must be replaced.  Talk to your health care provider before starting an exercise program to make sure it is safe for you. Remember, almost any type of activity is better than none. Document Released: 10/07/2003 Document Revised: 12/01/2013 Document Reviewed: 12/24/2012 ExitCare Patient Information 2015 [employer]. This information is not intended to replace advice given to you by your health care provider. Make sure you discuss any   questions you have with your health care provider.  

## 2014-05-11 LAB — WOUND CULTURE

## 2014-05-15 ENCOUNTER — Telehealth: Payer: Self-pay | Admitting: Emergency Medicine

## 2014-05-15 MED ORDER — DOXYCYCLINE HYCLATE 100 MG PO TABS: 100.0000 mg | ORAL_TABLET | Freq: Two times a day (BID) | ORAL | Status: DC

## 2014-05-15 NOTE — Telephone Encounter (Signed)
Message copied by Darlis LoanSMITH, Rashaud Ybarbo D on Fri May 15, 2014  5:45 PM ------      Message from: Jeanann LewandowskyJEGEDE, OLUGBEMIGA E      Created: Wed May 13, 2014 11:48 AM       Please inform patient that his wound culture grew MRSA, this may just be colonization of the skin around the wound or real infection but we may have to give him few more days of antibiotics            Please call in doxycycline 100 mg tablet by mouth twice a day for 10 days ------

## 2014-05-18 ENCOUNTER — Telehealth: Payer: Self-pay | Admitting: Emergency Medicine

## 2014-05-18 NOTE — Telephone Encounter (Signed)
Left message for pt to call for lab results with medication instructions 

## 2014-05-19 ENCOUNTER — Ambulatory Visit: Payer: Self-pay | Attending: Internal Medicine | Admitting: *Deleted

## 2014-05-19 DIAGNOSIS — Z599 Problem related to housing and economic circumstances, unspecified: Secondary | ICD-10-CM

## 2014-05-19 NOTE — Progress Notes (Signed)
LCSW met with patient about resources.  Patient stated that he needed housing resources because he is being evicted from his current residence. LCSW provided resources for housing and for legal aid if needed.  Patient had no other social work needs at this time.  Patient can return as needed.  Christene Lye MSW, LCSW

## 2014-05-26 ENCOUNTER — Ambulatory Visit (INDEPENDENT_AMBULATORY_CARE_PROVIDER_SITE_OTHER): Payer: Self-pay | Admitting: Internal Medicine

## 2014-05-26 ENCOUNTER — Encounter: Payer: Self-pay | Admitting: Internal Medicine

## 2014-05-26 VITALS — BP 160/94 | HR 94 | Temp 97.5°F | Wt 165.0 lb

## 2014-05-26 DIAGNOSIS — M869 Osteomyelitis, unspecified: Secondary | ICD-10-CM

## 2014-05-26 LAB — C-REACTIVE PROTEIN: CRP: 2.7 mg/dL — ABNORMAL HIGH

## 2014-05-26 NOTE — Progress Notes (Signed)
Patient ID: Ryan [name], male   DOB: August 22, 1960, 53 y.o.   MRN: [id number]         [facility] for Infectious Disease  Patient Active Problem List   Diagnosis Date Noted  . HTN (hypertension) 05/07/2014  . Diabetic foot 05/07/2014  . Systolic murmur of aorta 63/94/3200  . Gram-positive bacteremia 01/13/2014  . Osteomyelitis of right foot 01/13/2014  . Osteomyelitis of foot, right, acute 01/13/2014  . Hyponatremia 01/12/2014  . Leukocytosis, unspecified 01/12/2014  . Diabetes mellitus 01/11/2014  . 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 Osteomyelitis 01/11/2014    Patient's Medications  New Prescriptions   No medications on file  Previous Medications   CYANOCOBALAMIN (B-12 PO)    Take 1 tablet by mouth daily.   DOXYCYCLINE (VIBRA-TABS) 100 MG TABLET    Take 1 tablet (100 mg total) by mouth 2 (two) times daily.   FERROUS SULFATE 325 (65 FE) MG TABLET    Take 1 tablet (325 mg total) by mouth 3 (three) times daily after meals.   GLUCOSE BLOOD (TRUETEST TEST) TEST STRIP    Use as instructed   INSULIN DETEMIR (LEVEMIR) 100 UNIT/ML INJECTION    Inject 0.1 mLs (10 Units total) into the skin at bedtime.   INSULIN SYRINGE-NEEDLE U-100 30G X 5/16" 0.5 ML MISC    1 Syringe by Does not apply route daily.   TRUEPLUS LANCETS 33G MISC    1 each by Does not apply route 3 (three) times daily before meals.   UNABLE TO FIND    Apply 1 application topically daily. Silver shield gel  Modified Medications   No medications on file  Discontinued Medications   METFORMIN (GLUCOPHAGE) 500 MG TABLET    Take 500 mg by mouth daily.    Subjective: Ryan [name] is in for his routine visit.  He was seen at [facility] last week and a little drainage was noted fro his right foot wound. He had noted any worsening. A superficial swab grew MRSA and he was restarted on doxyciline. Review of Systems: Pertinent items are noted in HPI.  Past Medical History  Diagnosis Date  . Diabetes  mellitus without complication     History  Substance Use Topics  . Smoking status: Never Smoker   . Smokeless tobacco: Not on file  . Alcohol Use: No     Comment: occasionally    No family history on file.  No Known Allergies  Objective: Temp: 97.5 F (36.4 C) (10/27 1007) Temp Source: Oral (10/27 1007) BP: 160/94 mmHg (10/27 1007) Pulse Rate: 94 (10/27 1007)  General: He is in good spirits Right foot: His great toe is surgically absent. He has 2 small open areas in his surgical wound. They have decreased in size since his last visit. There is no drainage or odor.   Assessment: The MRSA probably reflects colonization.  Plan: 1. Repeat ESR and CRP 2. Followup in 3-4 weeks   Michel Bickers, MD [facility] for [place] [phone] pager   [phone] cell 05/26/2014, 10:28 AM

## 2014-05-27 LAB — SEDIMENTATION RATE: Sed Rate: 55 mm/hr — ABNORMAL HIGH (ref 0–16)

## 2014-06-08 ENCOUNTER — Inpatient Hospital Stay (HOSPITAL_COMMUNITY): Payer: Self-pay | Admitting: Anesthesiology

## 2014-06-08 ENCOUNTER — Telehealth: Payer: Self-pay | Admitting: *Deleted

## 2014-06-08 ENCOUNTER — Encounter (HOSPITAL_COMMUNITY)
Admission: EM | Disposition: A | Discharge status: Home / Self Care | Payer: Self-pay | Source: Home / Self Care | Attending: Internal Medicine

## 2014-06-08 ENCOUNTER — Encounter (HOSPITAL_COMMUNITY): Payer: Self-pay | Admitting: Emergency Medicine

## 2014-06-08 ENCOUNTER — Inpatient Hospital Stay (HOSPITAL_COMMUNITY)
Admission: EM | Admit: 2014-06-08 | Discharge: 2014-06-11 | DRG: 504 | Disposition: A | Discharge status: Home / Self Care | Payer: Self-pay | Attending: Internal Medicine | Admitting: Internal Medicine

## 2014-06-08 ENCOUNTER — Emergency Department (HOSPITAL_COMMUNITY): Payer: Self-pay

## 2014-06-08 DIAGNOSIS — M84478A Pathological fracture, left toe(s), initial encounter for fracture: Secondary | ICD-10-CM | POA: Diagnosis present

## 2014-06-08 DIAGNOSIS — L97509 Non-pressure chronic ulcer of other part of unspecified foot with unspecified severity: Secondary | ICD-10-CM | POA: Diagnosis present

## 2014-06-08 DIAGNOSIS — Z888 Allergy status to other drugs, medicaments and biological substances status: Secondary | ICD-10-CM

## 2014-06-08 DIAGNOSIS — Z794 Long term (current) use of insulin: Secondary | ICD-10-CM

## 2014-06-08 DIAGNOSIS — E871 Hypo-osmolality and hyponatremia: Secondary | ICD-10-CM | POA: Diagnosis present

## 2014-06-08 DIAGNOSIS — E119 Type 2 diabetes mellitus without complications: Secondary | ICD-10-CM

## 2014-06-08 DIAGNOSIS — M86172 Other acute osteomyelitis, left ankle and foot: Principal | ICD-10-CM | POA: Diagnosis present

## 2014-06-08 DIAGNOSIS — E11621 Type 2 diabetes mellitus with foot ulcer: Secondary | ICD-10-CM | POA: Diagnosis present

## 2014-06-08 DIAGNOSIS — L02619 Cutaneous abscess of unspecified foot: Secondary | ICD-10-CM

## 2014-06-08 DIAGNOSIS — Z792 Long term (current) use of antibiotics: Secondary | ICD-10-CM

## 2014-06-08 DIAGNOSIS — I10 Essential (primary) hypertension: Secondary | ICD-10-CM | POA: Diagnosis present

## 2014-06-08 DIAGNOSIS — F17220 Nicotine dependence, chewing tobacco, uncomplicated: Secondary | ICD-10-CM | POA: Diagnosis present

## 2014-06-08 DIAGNOSIS — D72829 Elevated white blood cell count, unspecified: Secondary | ICD-10-CM | POA: Diagnosis present

## 2014-06-08 DIAGNOSIS — M869 Osteomyelitis, unspecified: Secondary | ICD-10-CM

## 2014-06-08 DIAGNOSIS — R6510 Systemic inflammatory response syndrome (SIRS) of non-infectious origin without acute organ dysfunction: Secondary | ICD-10-CM | POA: Diagnosis present

## 2014-06-08 DIAGNOSIS — Z8614 Personal history of Methicillin resistant Staphylococcus aureus infection: Secondary | ICD-10-CM

## 2014-06-08 DIAGNOSIS — A419 Sepsis, unspecified organism: Secondary | ICD-10-CM

## 2014-06-08 DIAGNOSIS — R000 Tachycardia, unspecified: Secondary | ICD-10-CM | POA: Diagnosis present

## 2014-06-08 DIAGNOSIS — E118 Type 2 diabetes mellitus with unspecified complications: Secondary | ICD-10-CM | POA: Diagnosis present

## 2014-06-08 DIAGNOSIS — E876 Hypokalemia: Secondary | ICD-10-CM | POA: Diagnosis not present

## 2014-06-08 DIAGNOSIS — D638 Anemia in other chronic diseases classified elsewhere: Secondary | ICD-10-CM | POA: Diagnosis present

## 2014-06-08 DIAGNOSIS — Z79899 Other long term (current) drug therapy: Secondary | ICD-10-CM

## 2014-06-08 DIAGNOSIS — L03119 Cellulitis of unspecified part of limb: Secondary | ICD-10-CM

## 2014-06-08 HISTORY — PX: AMPUTATION: SHX166

## 2014-06-08 LAB — COMPREHENSIVE METABOLIC PANEL
ALT: 15 U/L (ref 0–53)
ANION GAP: 14 (ref 5–15)
AST: 15 U/L (ref 0–37)
Albumin: 3.5 g/dL (ref 3.5–5.2)
Alkaline Phosphatase: 116 U/L (ref 39–117)
BILIRUBIN TOTAL: 0.5 mg/dL (ref 0.3–1.2)
BUN: 13 mg/dL (ref 6–23)
CHLORIDE: 94 meq/L — AB (ref 96–112)
CO2: 26 meq/L (ref 19–32)
CREATININE: 0.75 mg/dL (ref 0.50–1.35)
Calcium: 9.5 mg/dL (ref 8.4–10.5)
GLUCOSE: 242 mg/dL — AB (ref 70–99)
Potassium: 4.2 mEq/L (ref 3.7–5.3)
Sodium: 134 mEq/L — ABNORMAL LOW (ref 137–147)
Total Protein: 9.1 g/dL — ABNORMAL HIGH (ref 6.0–8.3)

## 2014-06-08 LAB — CBC WITH DIFFERENTIAL/PLATELET
Basophils Absolute: 0 10*3/uL (ref 0.0–0.1)
Basophils Relative: 0 % (ref 0–1)
Eosinophils Absolute: 0 10*3/uL (ref 0.0–0.7)
Eosinophils Relative: 0 % (ref 0–5)
HEMATOCRIT: 40.3 % (ref 39.0–52.0)
HEMOGLOBIN: 13.3 g/dL (ref 13.0–17.0)
LYMPHS ABS: 0.9 10*3/uL (ref 0.7–4.0)
Lymphocytes Relative: 6 % — ABNORMAL LOW (ref 12–46)
MCH: 29.7 pg (ref 26.0–34.0)
MCHC: 33 g/dL (ref 30.0–36.0)
MCV: 90 fL (ref 78.0–100.0)
MONO ABS: 0.9 10*3/uL (ref 0.1–1.0)
MONOS PCT: 6 % (ref 3–12)
NEUTROS ABS: 14 10*3/uL — AB (ref 1.7–7.7)
Neutrophils Relative %: 88 % — ABNORMAL HIGH (ref 43–77)
Platelets: 292 10*3/uL (ref 150–400)
RBC: 4.48 MIL/uL (ref 4.22–5.81)
RDW: 13.4 % (ref 11.5–15.5)
WBC: 15.8 10*3/uL — ABNORMAL HIGH (ref 4.0–10.5)

## 2014-06-08 LAB — GLUCOSE, CAPILLARY
GLUCOSE-CAPILLARY: 115 mg/dL — AB (ref 70–99)
Glucose-Capillary: 96 mg/dL (ref 70–99)
Glucose-Capillary: 109 mg/dL — ABNORMAL HIGH (ref 70–99)

## 2014-06-08 LAB — C-REACTIVE PROTEIN: CRP: 20 mg/dL — ABNORMAL HIGH (ref <0.60)

## 2014-06-08 LAB — SEDIMENTATION RATE: SED RATE: 81 mm/h — AB (ref 0–16)

## 2014-06-08 LAB — SURGICAL PCR SCREEN
MRSA, PCR: NEGATIVE
STAPHYLOCOCCUS AUREUS: NEGATIVE

## 2014-06-08 LAB — I-STAT CG4 LACTIC ACID, ED: LACTIC ACID, VENOUS: 1.68 mmol/L (ref 0.5–2.2)

## 2014-06-08 LAB — ALBUMIN: Albumin: 2.8 g/dL — ABNORMAL LOW (ref 3.5–5.2)

## 2014-06-08 SURGERY — AMPUTATION DIGIT
Anesthesia: General | Site: Toe | Laterality: Left

## 2014-06-08 MED ORDER — ACETAMINOPHEN 325 MG PO TABS: 650.0000 mg | ORAL_TABLET | Freq: Four times a day (QID) | ORAL | Status: DC | PRN

## 2014-06-08 MED ORDER — VITAMIN B-12 1000 MCG PO TABS
1000.0000 ug | ORAL_TABLET | Freq: Every day | ORAL | Status: DC
  Administered 2014-06-08 – 2014-06-11 (×4): 1000 ug via ORAL
  Filled 2014-06-08 (×4): qty 1

## 2014-06-08 MED ORDER — CHLORHEXIDINE GLUCONATE 4 % EX LIQD
60.0000 mL | Freq: Once | CUTANEOUS | Status: DC
  Filled 2014-06-08: qty 60

## 2014-06-08 MED ORDER — PROPOFOL 10 MG/ML IV BOLUS
INTRAVENOUS | Status: DC | PRN
  Administered 2014-06-08: 150 mg via INTRAVENOUS

## 2014-06-08 MED ORDER — PIPERACILLIN-TAZOBACTAM 3.375 G IVPB 30 MIN
3.3750 g | Freq: Once | INTRAVENOUS | Status: AC
  Administered 2014-06-08: 3.375 g via INTRAVENOUS
  Filled 2014-06-08: qty 50

## 2014-06-08 MED ORDER — INSULIN ASPART 100 UNIT/ML ~~LOC~~ SOLN
0.0000 [IU] | Freq: Three times a day (TID) | SUBCUTANEOUS | Status: DC
  Administered 2014-06-09: 5 [IU] via SUBCUTANEOUS
  Administered 2014-06-10 (×2): 3 [IU] via SUBCUTANEOUS
  Administered 2014-06-10: 2 [IU] via SUBCUTANEOUS
  Administered 2014-06-11: 3 [IU] via SUBCUTANEOUS
  Administered 2014-06-11: 2 [IU] via SUBCUTANEOUS

## 2014-06-08 MED ORDER — LIDOCAINE HCL (PF) 2 % IJ SOLN
INTRAMUSCULAR | Status: DC | PRN
  Administered 2014-06-08: 75 mg via INTRADERMAL

## 2014-06-08 MED ORDER — PROMETHAZINE HCL 25 MG/ML IJ SOLN: 6.2500 mg | INTRAMUSCULAR | Status: DC | PRN

## 2014-06-08 MED ORDER — LIDOCAINE HCL (CARDIAC) 20 MG/ML IV SOLN
INTRAVENOUS | Status: AC
  Filled 2014-06-08: qty 5

## 2014-06-08 MED ORDER — SODIUM CHLORIDE 0.9 % IV SOLN
INTRAVENOUS | Status: AC
  Administered 2014-06-08: 17:00:00 via INTRAVENOUS

## 2014-06-08 MED ORDER — OXYCODONE-ACETAMINOPHEN 5-325 MG PO TABS: 1.0000 | ORAL_TABLET | ORAL | Status: DC | PRN

## 2014-06-08 MED ORDER — SUCCINYLCHOLINE CHLORIDE 20 MG/ML IJ SOLN
INTRAMUSCULAR | Status: DC | PRN
  Administered 2014-06-08: 100 mg via INTRAVENOUS

## 2014-06-08 MED ORDER — VANCOMYCIN HCL IN DEXTROSE 1-5 GM/200ML-% IV SOLN
1000.0000 mg | Freq: Once | INTRAVENOUS | Status: AC
  Administered 2014-06-08: 1000 mg via INTRAVENOUS
  Filled 2014-06-08: qty 200

## 2014-06-08 MED ORDER — 0.9 % SODIUM CHLORIDE (POUR BTL) OPTIME
TOPICAL | Status: DC | PRN
  Administered 2014-06-08: 1000 mL

## 2014-06-08 MED ORDER — ONDANSETRON HCL 4 MG/2ML IJ SOLN: 4.0000 mg | Freq: Four times a day (QID) | INTRAMUSCULAR | Status: DC | PRN

## 2014-06-08 MED ORDER — HYDRALAZINE HCL 20 MG/ML IJ SOLN: 10.0000 mg | Freq: Three times a day (TID) | INTRAMUSCULAR | Status: DC | PRN

## 2014-06-08 MED ORDER — METRONIDAZOLE 500 MG PO TABS
500.0000 mg | ORAL_TABLET | Freq: Three times a day (TID) | ORAL | Status: DC
  Administered 2014-06-08 – 2014-06-11 (×9): 500 mg via ORAL
  Filled 2014-06-08 (×11): qty 1

## 2014-06-08 MED ORDER — FENTANYL CITRATE 0.05 MG/ML IJ SOLN: 25.0000 ug | INTRAMUSCULAR | Status: DC | PRN

## 2014-06-08 MED ORDER — METOCLOPRAMIDE HCL 10 MG PO TABS: 5.0000 mg | ORAL_TABLET | Freq: Three times a day (TID) | ORAL | Status: DC | PRN

## 2014-06-08 MED ORDER — FENTANYL CITRATE 0.05 MG/ML IJ SOLN
INTRAMUSCULAR | Status: DC | PRN
  Administered 2014-06-08 (×2): 50 ug via INTRAVENOUS
  Administered 2014-06-08: 100 ug via INTRAVENOUS

## 2014-06-08 MED ORDER — PROPOFOL 10 MG/ML IV BOLUS
INTRAVENOUS | Status: AC
  Filled 2014-06-08: qty 20

## 2014-06-08 MED ORDER — PHENYLEPHRINE 40 MCG/ML (10ML) SYRINGE FOR IV PUSH (FOR BLOOD PRESSURE SUPPORT)
PREFILLED_SYRINGE | INTRAVENOUS | Status: AC
  Filled 2014-06-08: qty 10

## 2014-06-08 MED ORDER — DEXTROSE 5 % IV SOLN
2.0000 g | INTRAVENOUS | Status: DC
  Administered 2014-06-08 – 2014-06-10 (×3): 2 g via INTRAVENOUS
  Filled 2014-06-08 (×5): qty 2

## 2014-06-08 MED ORDER — FENTANYL CITRATE 0.05 MG/ML IJ SOLN
INTRAMUSCULAR | Status: AC
  Filled 2014-06-08: qty 2

## 2014-06-08 MED ORDER — VANCOMYCIN HCL IN DEXTROSE 1-5 GM/200ML-% IV SOLN
1000.0000 mg | Freq: Three times a day (TID) | INTRAVENOUS | Status: DC
  Administered 2014-06-08 – 2014-06-11 (×9): 1000 mg via INTRAVENOUS
  Filled 2014-06-08 (×10): qty 200

## 2014-06-08 MED ORDER — LACTATED RINGERS IV SOLN
INTRAVENOUS | Status: DC | PRN
  Administered 2014-06-08: 21:00:00 via INTRAVENOUS

## 2014-06-08 MED ORDER — SODIUM CHLORIDE 0.9 % IV BOLUS (SEPSIS)
1000.0000 mL | Freq: Once | INTRAVENOUS | Status: AC
  Administered 2014-06-08: 1000 mL via INTRAVENOUS

## 2014-06-08 MED ORDER — MIDAZOLAM HCL 5 MG/5ML IJ SOLN
INTRAMUSCULAR | Status: DC | PRN
  Administered 2014-06-08: 2 mg via INTRAVENOUS

## 2014-06-08 MED ORDER — SODIUM CHLORIDE 0.9 % IV SOLN: INTRAVENOUS | Status: DC

## 2014-06-08 MED ORDER — PHENYLEPHRINE HCL 10 MG/ML IJ SOLN
INTRAMUSCULAR | Status: DC | PRN
  Administered 2014-06-08: 40 ug via INTRAVENOUS

## 2014-06-08 MED ORDER — ONDANSETRON HCL 4 MG/2ML IJ SOLN
INTRAMUSCULAR | Status: AC
  Filled 2014-06-08: qty 2

## 2014-06-08 MED ORDER — ONDANSETRON HCL 4 MG PO TABS
4.0000 mg | ORAL_TABLET | Freq: Four times a day (QID) | ORAL | Status: DC | PRN
Start: 2014-06-08 — End: 2014-06-11

## 2014-06-08 MED ORDER — METOCLOPRAMIDE HCL 5 MG/ML IJ SOLN: 5.0000 mg | Freq: Three times a day (TID) | INTRAMUSCULAR | Status: DC | PRN

## 2014-06-08 MED ORDER — FERROUS SULFATE 325 (65 FE) MG PO TABS
325.0000 mg | ORAL_TABLET | Freq: Three times a day (TID) | ORAL | Status: DC
  Administered 2014-06-08 – 2014-06-11 (×9): 325 mg via ORAL
  Filled 2014-06-08 (×11): qty 1

## 2014-06-08 MED ORDER — EPHEDRINE SULFATE 50 MG/ML IJ SOLN
INTRAMUSCULAR | Status: DC | PRN
  Administered 2014-06-08: 10 mg via INTRAVENOUS
  Administered 2014-06-08 (×2): 5 mg via INTRAVENOUS

## 2014-06-08 MED ORDER — SODIUM CHLORIDE 0.9 % IR SOLN
Status: DC | PRN
  Administered 2014-06-08: 1000 mL

## 2014-06-08 MED ORDER — B-12 1000 MCG PO CAPS: 1.0000 | ORAL_CAPSULE | Freq: Every day | ORAL | Status: DC

## 2014-06-08 MED ORDER — ACETAMINOPHEN 650 MG RE SUPP: 650.0000 mg | Freq: Four times a day (QID) | RECTAL | Status: DC | PRN

## 2014-06-08 MED ORDER — HEPARIN SODIUM (PORCINE) 5000 UNIT/ML IJ SOLN
5000.0000 [IU] | Freq: Three times a day (TID) | INTRAMUSCULAR | Status: DC
  Administered 2014-06-08 – 2014-06-11 (×9): 5000 [IU] via SUBCUTANEOUS
  Filled 2014-06-08 (×11): qty 1

## 2014-06-08 MED ORDER — MIDAZOLAM HCL 2 MG/2ML IJ SOLN
INTRAMUSCULAR | Status: AC
  Filled 2014-06-08: qty 2

## 2014-06-08 MED ORDER — OXYCODONE HCL 5 MG PO TABS: 5.0000 mg | ORAL_TABLET | ORAL | Status: DC | PRN

## 2014-06-08 SURGICAL SUPPLY — 35 items
BAG SPEC THK2 15X12 ZIP CLS (MISCELLANEOUS) ×1
BAG ZIPLOCK 12X15 (MISCELLANEOUS) ×3 IMPLANT
BANDAGE ELASTIC 4 VELCRO ST LF (GAUZE/BANDAGES/DRESSINGS) ×2 IMPLANT
BLADE MIC 41X13 (BLADE) ×1 IMPLANT
BLADE MIC 41X13MM (BLADE) ×1
BLADE SAW SGTL 81X20 HD (BLADE) ×2 IMPLANT
BLADE SURG SZ10 CARB STEEL (BLADE) ×6 IMPLANT
BNDG GAUZE ELAST 4 BULKY (GAUZE/BANDAGES/DRESSINGS) ×2 IMPLANT
CUFF TOURN SGL QUICK 24 (TOURNIQUET CUFF) ×3
CUFF TRNQT CYL 24X4X40X1 (TOURNIQUET CUFF) IMPLANT
DRAPE U-SHAPE 47X51 STRL (DRAPES) ×2 IMPLANT
DRSG PAD ABDOMINAL 8X10 ST (GAUZE/BANDAGES/DRESSINGS) ×3 IMPLANT
DURAPREP 26ML APPLICATOR (WOUND CARE) ×1 IMPLANT
ELECT REM PT RETURN 9FT ADLT (ELECTROSURGICAL) ×3
ELECTRODE REM PT RTRN 9FT ADLT (ELECTROSURGICAL) ×1 IMPLANT
GAUZE SPONGE 4X4 12PLY STRL (GAUZE/BANDAGES/DRESSINGS) ×3 IMPLANT
GLOVE ORTHO TXT STRL SZ7.5 (GLOVE) ×3 IMPLANT
GLOVE SURG ORTHO 8.5 STRL (GLOVE) ×3 IMPLANT
GOWN STRL REUS W/TWL LRG LVL3 (GOWN DISPOSABLE) ×6 IMPLANT
HANDPIECE INTERPULSE COAX TIP (DISPOSABLE) ×3
KIT BASIN OR (CUSTOM PROCEDURE TRAY) ×3 IMPLANT
MANIFOLD NEPTUNE II (INSTRUMENTS) ×3 IMPLANT
NS IRRIG 1000ML POUR BTL (IV SOLUTION) ×3 IMPLANT
PACK ORTHO EXTREMITY (CUSTOM PROCEDURE TRAY) ×3 IMPLANT
PAD ABD 8X10 STRL (GAUZE/BANDAGES/DRESSINGS) ×4 IMPLANT
PAD CAST 4YDX4 CTTN HI CHSV (CAST SUPPLIES) ×1 IMPLANT
PADDING CAST COTTON 4X4 STRL (CAST SUPPLIES) ×3
POSITIONER SURGICAL ARM (MISCELLANEOUS) ×3 IMPLANT
SET HNDPC FAN SPRY TIP SCT (DISPOSABLE) IMPLANT
SUT ETHILON 3 0 PS 1 (SUTURE) ×5 IMPLANT
SUT NYLON 3 0 (SUTURE) ×3 IMPLANT
SYR BULB IRRIGATION 50ML (SYRINGE) ×2 IMPLANT
TOWEL OR 17X26 10 PK STRL BLUE (TOWEL DISPOSABLE) ×6 IMPLANT
TRAY PREP A LATEX SAFE STRL (SET/KITS/TRAYS/PACK) ×2 IMPLANT
WATER STERILE IRR 1500ML POUR (IV SOLUTION) ×3 IMPLANT

## 2014-06-08 NOTE — H&P (View-Only) (Signed)
Ryan [name] is an 53 y.o. male.    Chief Complaint: left great toe swelling/discoloration  HPI: 53 y/o male with hx of diabetes and recent right great toe amputation presents with worsening three day hx of toe swelling, discoloration, and erythema. Recent hx of mild infection to left great toe treated with oral doxycycline. Three days ago pt noted worsening swelling and discoloration of left great toe extending into left foot. C/o erythema to foot with pain extending into left leg. Denies any falls or injuries to his foot. Denies any other symptoms  PCP:  Angelica Chessman, MD  PMH: Past Medical History  Diagnosis Date  . Diabetes mellitus without complication     PSH: Past Surgical History  Procedure Laterality Date  . Amputation Right 01/13/2014    Procedure: Right great toe amputation;  Surgeon: Tobi Bastos, MD;  Location: [facility];  Service: Orthopedics;  Laterality: Right;  . Knee surgery Right   . Arm surgery Left     due to broken arm    Social History:  reports that he has never smoked. His smokeless tobacco use includes Snuff. He reports that he does not drink alcohol or use illicit drugs.  Allergies:  Allergies  Allergen Reactions  . Metformin And Related Other (See Comments)    Pt states that metformin made sugars fall into 30's    Medications: Current Facility-Administered Medications  Medication Dose Route Frequency Provider Last Rate Last Dose  . 0.9 %  sodium chloride infusion   Intravenous STAT Wandra Arthurs, MD        Results for orders placed or performed during the hospital encounter of 06/08/14 (from the past 48 hour(s))  CBC with Differential     Status: Abnormal   Collection Time: 06/08/14 12:52 PM  Result Value Ref Range   WBC 15.8 (H) 4.0 - 10.5 K/uL   RBC 4.48 4.22 - 5.81 MIL/uL   Hemoglobin 13.3 13.0 - 17.0 g/dL   HCT 40.3 39.0 - 52.0 %   MCV 90.0 78.0 - 100.0 fL   MCH 29.7 26.0 - 34.0 pg   MCHC 33.0 30.0 - 36.0 g/dL   RDW 13.4 11.5  - 15.5 %   Platelets 292 150 - 400 K/uL   Neutrophils Relative % 88 (H) 43 - 77 %   Neutro Abs 14.0 (H) 1.7 - 7.7 K/uL   Lymphocytes Relative 6 (L) 12 - 46 %   Lymphs Abs 0.9 0.7 - 4.0 K/uL   Monocytes Relative 6 3 - 12 %   Monocytes Absolute 0.9 0.1 - 1.0 K/uL   Eosinophils Relative 0 0 - 5 %   Eosinophils Absolute 0.0 0.0 - 0.7 K/uL   Basophils Relative 0 0 - 1 %   Basophils Absolute 0.0 0.0 - 0.1 K/uL  Comprehensive metabolic panel     Status: Abnormal   Collection Time: 06/08/14 12:52 PM  Result Value Ref Range   Sodium 134 (L) 137 - 147 mEq/L   Potassium 4.2 3.7 - 5.3 mEq/L   Chloride 94 (L) 96 - 112 mEq/L   CO2 26 19 - 32 mEq/L   Glucose, Bld 242 (H) 70 - 99 mg/dL   BUN 13 6 - 23 mg/dL   Creatinine, Ser 0.75 0.50 - 1.35 mg/dL   Calcium 9.5 8.4 - 10.5 mg/dL   Total Protein 9.1 (H) 6.0 - 8.3 g/dL   Albumin 3.5 3.5 - 5.2 g/dL   AST 15 0 - 37 U/L   ALT  15 0 - 53 U/L   Alkaline Phosphatase 116 39 - 117 U/L   Total Bilirubin 0.5 0.3 - 1.2 mg/dL   GFR calc non Af Amer >90 >90 mL/min   GFR calc Af Amer >90 >90 mL/min    Comment: (NOTE) The eGFR has been calculated using the CKD EPI equation. This calculation has not been validated in all clinical situations. eGFR's persistently <90 mL/min signify possible Chronic Kidney Disease.    Anion gap 14 5 - 15  I-Stat CG4 Lactic Acid, ED     Status: None   Collection Time: 06/08/14  1:03 PM  Result Value Ref Range   Lactic Acid, Venous 1.68 0.5 - 2.2 mmol/L   Dg Toe Great Left  06/08/2014   CLINICAL DATA:  Pain and swelling of the great toe without known injury; infection 4 months ago with interval healing and now parrot Re infection.  EXAM: LEFT GREAT TOE  COMPARISON:  Report of an MRI of the left foot of January 12, 2014.  FINDINGS: There are destructive bony changes of the distal phalanx of the great toe. A pathologic fracture is present proximally. The proximal phalanx is intact. The interphalangeal joint space is preserved. There  is soft tissue swelling diffusely over the first digit.  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   Electronically Signed   By: David  Martinique   On: 06/08/2014 13:13    ROS: ROS Recent right great toe amputation Left toe treated with oral antibiotics within the last 2 weeks  Physical Exam: Alert and appropriate 53 y/o male in no acute distress Right foot: recent right great toe amputation with well healed incision  nv intact to other toes to right foot with good rom Left foot: moderate erythema and edema to mid foot and left great toe Necrosis and edema to distal left great toe with limited sensation No active drainage currently  Physical Exam   X-rays: left great toe with appearance of osteomyelitis to distal phalanx  Assessment/Plan Assessment: left great toe amputation  Plan:  Medicine team to admit Plan for surgery later tonight for left great toe amputation Risks and benefits discussed IV antibiotics Keep NPO Pain control as needed

## 2014-06-08 NOTE — Consult Note (Addendum)
Ryan [name] is an 53 y.o. male.    Chief Complaint: left great toe swelling/discoloration  HPI: 53 y/o male with hx of diabetes and recent right great toe amputation presents with worsening three day hx of toe swelling, discoloration, and erythema. Recent hx of mild infection to left great toe treated with oral doxycycline. Three days ago pt noted worsening swelling and discoloration of left great toe extending into left foot. C/o erythema to foot with pain extending into left leg. Denies any falls or injuries to his foot. Denies any other symptoms  PCP:  Angelica Chessman, MD  PMH: Past Medical History  Diagnosis Date  . Diabetes mellitus without complication     PSH: Past Surgical History  Procedure Laterality Date  . Amputation Right 01/13/2014    Procedure: Right great toe amputation;  Surgeon: Tobi Bastos, MD;  Location: [facility];  Service: Orthopedics;  Laterality: Right;  . Knee surgery Right   . Arm surgery Left     due to broken arm    Social History:  reports that he has never smoked. His smokeless tobacco use includes Snuff. He reports that he does not drink alcohol or use illicit drugs.  Allergies:  Allergies  Allergen Reactions  . Metformin And Related Other (See Comments)    Pt states that metformin made sugars fall into 30's    Medications: Current Facility-Administered Medications  Medication Dose Route Frequency Provider Last Rate Last Dose  . 0.9 %  sodium chloride infusion   Intravenous STAT Wandra Arthurs, MD        Results for orders placed or performed during the hospital encounter of 06/08/14 (from the past 48 hour(s))  CBC with Differential     Status: Abnormal   Collection Time: 06/08/14 12:52 PM  Result Value Ref Range   WBC 15.8 (H) 4.0 - 10.5 K/uL   RBC 4.48 4.22 - 5.81 MIL/uL   Hemoglobin 13.3 13.0 - 17.0 g/dL   HCT 40.3 39.0 - 52.0 %   MCV 90.0 78.0 - 100.0 fL   MCH 29.7 26.0 - 34.0 pg   MCHC 33.0 30.0 - 36.0 g/dL   RDW 13.4 11.5  - 15.5 %   Platelets 292 150 - 400 K/uL   Neutrophils Relative % 88 (H) 43 - 77 %   Neutro Abs 14.0 (H) 1.7 - 7.7 K/uL   Lymphocytes Relative 6 (L) 12 - 46 %   Lymphs Abs 0.9 0.7 - 4.0 K/uL   Monocytes Relative 6 3 - 12 %   Monocytes Absolute 0.9 0.1 - 1.0 K/uL   Eosinophils Relative 0 0 - 5 %   Eosinophils Absolute 0.0 0.0 - 0.7 K/uL   Basophils Relative 0 0 - 1 %   Basophils Absolute 0.0 0.0 - 0.1 K/uL  Comprehensive metabolic panel     Status: Abnormal   Collection Time: 06/08/14 12:52 PM  Result Value Ref Range   Sodium 134 (L) 137 - 147 mEq/L   Potassium 4.2 3.7 - 5.3 mEq/L   Chloride 94 (L) 96 - 112 mEq/L   CO2 26 19 - 32 mEq/L   Glucose, Bld 242 (H) 70 - 99 mg/dL   BUN 13 6 - 23 mg/dL   Creatinine, Ser 0.75 0.50 - 1.35 mg/dL   Calcium 9.5 8.4 - 10.5 mg/dL   Total Protein 9.1 (H) 6.0 - 8.3 g/dL   Albumin 3.5 3.5 - 5.2 g/dL   AST 15 0 - 37 U/L   ALT  15 0 - 53 U/L   Alkaline Phosphatase 116 39 - 117 U/L   Total Bilirubin 0.5 0.3 - 1.2 mg/dL   GFR calc non Af Amer >90 >90 mL/min   GFR calc Af Amer >90 >90 mL/min    Comment: (NOTE) The eGFR has been calculated using the CKD EPI equation. This calculation has not been validated in all clinical situations. eGFR's persistently <90 mL/min signify possible Chronic Kidney Disease.    Anion gap 14 5 - 15  I-Stat CG4 Lactic Acid, ED     Status: None   Collection Time: 06/08/14  1:03 PM  Result Value Ref Range   Lactic Acid, Venous 1.68 0.5 - 2.2 mmol/L   Dg Toe Great Left  06/08/2014   CLINICAL DATA:  Pain and swelling of the great toe without known injury; infection 4 months ago with interval healing and now parrot Re infection.  EXAM: LEFT GREAT TOE  COMPARISON:  Report of an MRI of the left foot of January 12, 2014.  FINDINGS: There are destructive bony changes of the distal phalanx of the great toe. A pathologic fracture is present proximally. The proximal phalanx is intact. The interphalangeal joint space is preserved. There  is soft tissue swelling diffusely over the first digit.  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   Electronically Signed   By: David  Martinique   On: 06/08/2014 13:13    ROS: ROS Recent right great toe amputation Left toe treated with oral antibiotics within the last 2 weeks  Physical Exam: Alert and appropriate 53 y/o male in no acute distress Right foot: recent right great toe amputation with well healed incision  nv intact to other toes to right foot with good rom Left foot: moderate erythema and edema to mid foot and left great toe Necrosis and edema to distal left great toe with limited sensation No active drainage currently  Physical Exam   X-rays: left great toe with appearance of osteomyelitis to distal phalanx  Assessment/Plan Assessment: left great toe amputation  Plan:  Medicine team to admit Plan for surgery later tonight for left great toe amputation Risks and benefits discussed IV antibiotics Keep NPO Pain control as needed   Agree with above evaluation and plan.  Patient has advanced osteo of the left great toe with pathologic fracture and bony destruction best managed with operative amputation.  I have discussed this with the patient who agrees with the plan.  Augustin Schooling, MD

## 2014-06-08 NOTE — Interval H&P Note (Signed)
History and Physical Interval Note:  06/08/2014 9:48 PM  Ryan [name]  has presented today for surgery, with the diagnosis of Osteomyelitis Left Great Toe  The various methods of treatment have been discussed with the patient and family. After consideration of risks, benefits and other options for treatment, the patient has consented to  Procedure(s): AMPUTATION LEFT GREAT TOE (Left) as a surgical intervention .  The patient's history has been reviewed, patient examined, no change in status, stable for surgery.  I have reviewed the patient's chart and labs.  Questions were answered to the patient's satisfaction.     Marcene Laskowski,STEVEN R

## 2014-06-08 NOTE — Anesthesia Preprocedure Evaluation (Signed)
Anesthesia Evaluation  Patient identified by MRN, date of birth, ID band Patient awake    Reviewed: Allergy & Precautions, H&P , NPO status , Patient's Chart, lab work & pertinent test results  Airway Mallampati: II  TM Distance: >3 FB Neck ROM: Full    Dental no notable dental hx.    Pulmonary neg pulmonary ROS,  breath sounds clear to auscultation  Pulmonary exam normal       Cardiovascular hypertension, Rhythm:Regular Rate:Normal     Neuro/Psych negative neurological ROS  negative psych ROS   GI/Hepatic negative GI ROS, Neg liver ROS,   Endo/Other  diabetes, Type 1, Insulin Dependent  Renal/GU negative Renal ROS  negative genitourinary   Musculoskeletal negative musculoskeletal ROS (+)   Abdominal   Peds negative pediatric ROS (+)  Hematology negative hematology ROS (+)   Anesthesia Other Findings   Reproductive/Obstetrics negative OB ROS                             Anesthesia Physical Anesthesia Plan  ASA: III  Anesthesia Plan: General   Post-op Pain Management:    Induction: Intravenous  Airway Management Planned: LMA  Additional Equipment:   Intra-op Plan:   Post-operative Plan: Extubation in OR  Informed Consent: I have reviewed the patients History and Physical, chart, labs and discussed the procedure including the risks, benefits and alternatives for the proposed anesthesia with the patient or authorized representative who has indicated his/her understanding and acceptance.   Dental advisory given  Plan Discussed with: CRNA  Anesthesia Plan Comments:         Anesthesia Quick Evaluation

## 2014-06-08 NOTE — ED Notes (Signed)
Pt back from X-ray.  

## 2014-06-08 NOTE — Transfer of Care (Signed)
Immediate Anesthesia Transfer of Care Note  Patient: Ryan [name]  Procedure(s) Performed: Procedure(s): AMPUTATION LEFT GREAT TOE (Left)  Patient Location: PACU  Anesthesia Type:General  Level of Consciousness: awake, alert , oriented and patient cooperative  Airway & Oxygen Therapy: Patient Spontanous Breathing and Patient connected to face mask oxygen  Post-op Assessment: Report given to PACU RN and Post -op Vital signs reviewed and stable  Post vital signs: Reviewed and stable  Complications: No apparent anesthesia complications

## 2014-06-08 NOTE — ED Notes (Signed)
Ryan PaoKenzie Campbell RN made an attempt to call report to floor, nurse busy at this time, stated would call back shortly

## 2014-06-08 NOTE — Brief Op Note (Signed)
06/08/2014  9:34 PM  PATIENT:  Ryan [name]  53 y.o. male  PRE-OPERATIVE DIAGNOSIS:  Osteomyelitis Left Great Toe  POST-OPERATIVE DIAGNOSIS:  Osteomyelitis Left Great Toe  PROCEDURE:  Procedure(s): AMPUTATION LEFT GREAT TOE (Left)  SURGEON:  Surgeon(s) and Role:    * Verlee RossettiSteven R Tashai Catino, MD - Primary  PHYSICIAN ASSISTANT:   ASSISTANTS: none   ANESTHESIA:   general  EBL:  Total I/O In: 136.3 [I.V.:136.3] Out: -   BLOOD ADMINISTERED:none  DRAINS: none   LOCAL MEDICATIONS USED:  NONE  SPECIMEN:  Source of Specimen:  left great toe  DISPOSITION OF SPECIMEN:  PATHOLOGY  COUNTS:  YES  TOURNIQUET:   Total Tourniquet Time Documented: Calf (Left) - 15 minutes Total: Calf (Left) - 15 minutes   DICTATION: .Other Dictation: Dictation Number [id number]  PLAN OF CARE: Admit to inpatient   PATIENT DISPOSITION:  PACU - hemodynamically stable.   Delay start of Pharmacological VTE agent (>24hrs) due to surgical blood loss or risk of bleeding: no

## 2014-06-08 NOTE — ED Notes (Signed)
Pt states his left great toe is turing grey and having d/c. Pt has already had right great toe taken off. Pt is diabetic.

## 2014-06-08 NOTE — Telephone Encounter (Signed)
Patient walked into clinic c/o great toe on right foot changing color and requesting it be cultured. His big toe has bloody drainage through his sock, it was not wrapped and appeared to have a very gray color. Patient was advised this needed to be examined at the ED ASAP. He is concerned about missing work as he is being evicted from his apartment in one week. Stressed how important it was for him to go to the ED and he said he had to pick up a check and he would then go to the emergency room.

## 2014-06-08 NOTE — ED Notes (Signed)
Patient transported to X-ray 

## 2014-06-08 NOTE — H&P (Signed)
[facility] History and Physical  Ryan [name] YTK:[id number] DOB: October 20, 1960 DOA: 06/08/2014  Referring physician: Dr. Darl Householder PCP: Angelica Chessman, MD   Chief Complaint: left great toe swelling, bloody discharge and discoloration  HPI: Ryan [name] is a 53 y.o. male with PMH significant for HTN, DM (on insulin) and diabetic foot ulcer (requiring recent amputation of R first toe); presented to ED with complaints of left great toe swelling, bloody discharge and discoloration. Patient reports that his left toe started swelling and with extended erythema over the last 3 days prior to admission. He endorses open wound on that toe recently for what he received treatment with doxycycline. Patient denies fever, chills, CP, nausea, vomiting, CP, SOB, dysuria, melena, hematochezia or any other complaints. In ED toe x-ray demonstrated osteomyelitis. Patient was found to be tachycardic (HR 112) and with elevated WBC's (15K range). [facility] called to admit patient for further evaluation and treatment.   Review of Systems:  Negative except as mentioned on HPI.  Past Medical History  Diagnosis Date  . Diabetes mellitus without complication    Past Surgical History  Procedure Laterality Date  . Amputation Right 01/13/2014    Procedure: Right great toe amputation;  Surgeon: Tobi Bastos, MD;  Location: [facility];  Service: Orthopedics;  Laterality: Right;  . Knee surgery Right   . Arm surgery Left     due to broken arm   Social History:  reports that he has never smoked. His smokeless tobacco use includes Snuff. He reports that he does not drink alcohol or use illicit drugs.  Allergies  Allergen Reactions  . Metformin And Related Other (See Comments)    Pt states that metformin made sugars fall into 30's    Family hx: unable to be reviewed as patient grew up in multiple boarding houses and never met his immediate family    Prior to Admission medications   Medication Sig Start Date End  Date Taking? Authorizing Provider  Cyanocobalamin (B-12 PO) Take 1 tablet by mouth daily.   Yes Historical Provider, MD  ferrous sulfate 325 (65 FE) MG tablet Take 1 tablet (325 mg total) by mouth 3 (three) times daily after meals. 01/15/14  Yes Costin Karlyne Greenspan, MD  insulin detemir (LEVEMIR) 100 UNIT/ML injection Inject 0.1 mLs (10 Units total) into the skin at bedtime. Patient taking differently: Inject 10 Units into the skin every morning.  01/20/14  Yes Tresa Garter, MD  Insulin Syringe-Needle U-100 30G X 5/16" 0.5 ML MISC 1 Syringe by Does not apply route daily. 05/07/14  Yes Tresa Garter, MD  TRUEPLUS LANCETS 33G MISC 1 each by Does not apply route 3 (three) times daily before meals. 01/15/14  Yes Costin Karlyne Greenspan, MD  UNABLE TO FIND Apply 1 application topically daily. Silver shield gel   Yes Historical Provider, MD  doxycycline (VIBRA-TABS) 100 MG tablet Take 1 tablet (100 mg total) by mouth 2 (two) times daily. 05/15/14   Tresa Garter, MD  glucose blood (TRUETEST TEST) test strip Use as instructed 05/07/14   Tresa Garter, MD   Physical Exam: Filed Vitals:   06/08/14 1217 06/08/14 1401 06/08/14 1529 06/08/14 1659  BP: 167/98 149/59 148/72 144/66  Pulse: 112 103  86  Temp: 98.2 F (36.8 C)   99 F (37.2 C)  TempSrc: Oral  Oral Oral  Resp: 20 16    SpO2: 100% 100% 98% 99%    Wt Readings from Last 3 Encounters:  05/26/14 74.844  kg (165 lb)  05/07/14 75.751 kg (167 lb)  04/14/14 71.215 kg (157 lb)    General:  Appears calm and comfortable; no fever. Able to follow commands and in no major distress  Eyes: PERRL, normal lids, irises & conjunctiva ENT: grossly normal hearing, lips & tongue; no erythema or exudates Neck: no LAD, masses or thyromegaly; no JVD Cardiovascular: mild tachycardic, no m/r/g. No LE edema. Respiratory: CTA bilaterally, no w/r/r. Normal respiratory effort. Abdomen: soft, nt, nd; positive BS Skin: dry skin; right foot with recent  first toe amputation (healing ok); also with left foot with big toe swelling, discoloration and erythema extending into volar face of his foot.  Musculoskeletal: grossly normal tone BUE/BLE Psychiatric: grossly normal mood and affect, speech fluent and appropriate Neurologic: grossly non-focal.          Labs on Admission:  Basic Metabolic Panel:  Recent Labs Lab 06/08/14 1252  NA 134*  K 4.2  CL 94*  CO2 26  GLUCOSE 242*  BUN 13  CREATININE 0.75  CALCIUM 9.5   Liver Function Tests:  Recent Labs Lab 06/08/14 1252  AST 15  ALT 15  ALKPHOS 116  BILITOT 0.5  PROT 9.1*  ALBUMIN 3.5   CBC:  Recent Labs Lab 06/08/14 1252  WBC 15.8*  NEUTROABS 14.0*  HGB 13.3  HCT 40.3  MCV 90.0  PLT 292   BNP (last 3 results)  Recent Labs  01/11/14 1855  PROBNP 148.5*   CBG: No results for input(s): GLUCAP in the last 168 hours.  Radiological Exams on Admission: Dg Toe Great Left  06/08/2014   CLINICAL DATA:  Pain and swelling of the great toe without known injury; infection 4 months ago with interval healing and now parrot Re infection.  EXAM: LEFT GREAT TOE  COMPARISON:  Report of an MRI of the left foot of January 12, 2014.  FINDINGS: There are destructive bony changes of the distal phalanx of the great toe. A pathologic fracture is present proximally. The proximal phalanx is intact. The interphalangeal joint space is preserved. There is soft tissue swelling diffusely over the first digit.  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   Electronically Signed   By: David  Martinique   On: 06/08/2014 13:13    EKG:  None   Assessment/Plan 1-SIRS due to left big toe Osteomyelitis: patient with erythema extending towards his foot, WBC's of 15.6 and HR 112. -patient foot x-ray demonstrating osteomyelitis and he had hx of MRSA -due to risk factors, SIRS, findings of  osteomyelitis and recent MRSA infection will start on antibiotics -will follow protocol for diabetic foot infection -ortho consulted and will most likely require amputation  2-Diabetes mellitus: most recent A1C 6.9 at the beginning of October -will use SSI  3-Leukocytosis: due to #1 -will follow trend -started on antibiotics; due to risk factors for sepsis and systemic infection   4-HTN (hypertension): will use PRN hydralazine -will benefit of low dose ACE/ARB at discharge  5-screening: will check HIV antibody   6-hyponatremia: will provide gentle hydration -follow electrolytes in am  Orthopedic service (Dr. Veverly Fells).  Code Status: Full DVT Prophylaxis:heparin Family Communication: no family at bedside Disposition Plan: inpatient, med-surg; LOS > 2 midnights   Time spent: 70 minutes  Barton Dubois [facility] Pager [phone]

## 2014-06-08 NOTE — Consult Note (Signed)
WOC wound consult note Reason for Consult:Diabetic Foot Ulcer Wound type:Neuropathic, infectious Noted is orthopedic's plan for amputation of Left Great Toe this evening.  Patient not seen. WOC nursing team will not follow, but will remain available to this patient, the nursing and medical team.  Please re-consult if needed. Thanks, Ladona MowLaurie Merek Niu, MSN, RN, GNP, [place]asper[place]OCN, CWON-AP [phone])

## 2014-06-08 NOTE — Progress Notes (Signed)
  ANTIBIOTIC CONSULT NOTE - INITIAL  Pharmacy Consult for Vancomycin Indication: Diabetic foot infection  Allergies  Allergen Reactions  . Metformin And Related Other (See Comments)    Pt states that metformin made sugars fall into 30's    Patient Measurements:     Vital Signs: Temp: 99 F (37.2 C) (11/09 1659) Temp Source: Oral (11/09 1659) BP: 144/66 mmHg (11/09 1659) Pulse Rate: 86 (11/09 1659) Intake/Output from previous day:   Intake/Output from this shift:    Labs:  Recent Labs  06/08/14 1252  WBC 15.8*  HGB 13.3  PLT 292  CREATININE 0.75   Estimated Creatinine Clearance: 115.6 mL/min (by C-G formula based on Cr of 0.75). No results for input(s): VANCOTROUGH, VANCOPEAK, VANCORANDOM, GENTTROUGH, GENTPEAK, GENTRANDOM, TOBRATROUGH, TOBRAPEAK, TOBRARND, AMIKACINPEAK, AMIKACINTROU, AMIKACIN in the last 72 hours.   Microbiology: No results found for this or any previous visit (from the past 720 hour(s)).  Medical History: Past Medical History  Diagnosis Date  . Diabetes mellitus without complication     Medications:  Anti-infectives    Start     Dose/Rate Route Frequency Ordered Stop   06/08/14 2200  metroNIDAZOLE (FLAGYL) tablet 500 mg     500 mg Oral 3 times per day 06/08/14 1816     06/08/14 2000  vancomycin (VANCOCIN) IVPB 1000 mg/200 mL premix     1,000 mg200 mL/hr over 60 Minutes Intravenous Every 8 hours 06/08/14 1826     06/08/14 1830  cefTRIAXone (ROCEPHIN) 2 g in dextrose 5 % 50 mL IVPB     2 g100 mL/hr over 30 Minutes Intravenous Every 24 hours 06/08/14 1816     06/08/14 1245  piperacillin-tazobactam (ZOSYN) IVPB 3.375 g     3.375 g100 mL/hr over 30 Minutes Intravenous  Once 06/08/14 1238 06/08/14 1358   06/08/14 1245  vancomycin (VANCOCIN) IVPB 1000 mg/200 mL premix     1,000 mg200 mL/hr over 60 Minutes Intravenous  Once 06/08/14 1238 06/08/14 1458     Assessment: 53yo diabetic M s/p recent L toe infection treated w/ doxycycline, presents  11/9 w/ 3 day hx of worsening L toe swelling, discoloration, and eythema. Started on Rocephin 2g IV q24h and Flagyl 500mg  IV q8h. Pharmacy is asked to dose Vancomycin.  11/9 >> Rocephin >> 11/9 >> Flagyl >>   11/9 >> Vanc >>    Tmax: AF WBCs: elevated Renal: wnl, CrCl >100  Goal of Therapy:  Vancomycin trough level 15-20 mcg/ml  Appropriate antibiotic dosing for renal function; eradication of infection  Plan:   Start Vanc 1g IV q8h  Measure Vanc trough at steady state  Follow up renal fxn and culture results  Charolotte Ekeom Nima Kemppainen, PharmD, pager [phone]. 06/08/2014,6:28 PM.

## 2014-06-08 NOTE — ED Provider Notes (Signed)
CSN: [id number]     Arrival date & time 06/08/14  1212 History   First MD Initiated Contact with Patient 06/08/14 1222     Chief Complaint  Patient presents with  . toe turning black     left great toe     (Consider location/radiation/quality/duration/timing/severity/associated sxs/prior Treatment) The history is provided by the patient.  Ryan [name] is a 53 y.o. male hx of DM, R toe gangrene s/p amputation here with left big toe pain and swelling. No other some swelling of the left big toe for the last 3 days ago worse since yesterday. Subjective chills but no fever. Noticed some purulent drainage from the left big toe. Had similar presentation several months ago and had to have right big toe amputation. Also recently finished a course of doxycycline for MRSA infection. Denies cough or chest pain or abdominal pain.    Past Medical History  Diagnosis Date  . Diabetes mellitus without complication    Past Surgical History  Procedure Laterality Date  . Amputation Right 01/13/2014    Procedure: Right great toe amputation;  Surgeon: Ryan Conesonald A Gioffre, MD;  Location: [facility];  Service: Orthopedics;  Laterality: Right;   No family history on file. History  Substance Use Topics  . Smoking status: Never Smoker   . Smokeless tobacco: Not on file  . Alcohol Use: No     Comment: occasionally    Review of Systems  Musculoskeletal:       L big toe pain   All other systems reviewed and are negative.     Allergies  Review of patient's allergies indicates no known allergies.  Home Medications   Prior to Admission medications   Medication Sig Start Date End Date Taking? Authorizing Provider  Cyanocobalamin (B-12 PO) Take 1 tablet by mouth daily.   Yes Historical Provider, MD  ferrous sulfate 325 (65 FE) MG tablet Take 1 tablet (325 mg total) by mouth 3 (three) times daily after meals. 01/15/14  Yes Ryan Otelia SergeantM Gherghe, MD  insulin detemir (LEVEMIR) 100 UNIT/ML injection Inject 0.1 mLs  (10 Units total) into the skin at bedtime. Patient taking differently: Inject 10 Units into the skin every morning.  01/20/14  Yes Ryan Angstlugbemiga E Jegede, MD  Insulin Syringe-Needle U-100 30G X 5/16" 0.5 ML MISC 1 Syringe by Does not apply route daily. 05/07/14  Yes Ryan Angstlugbemiga E Jegede, MD  TRUEPLUS LANCETS 33G MISC 1 each by Does not apply route 3 (three) times daily before meals. 01/15/14  Yes Ryan Otelia SergeantM Gherghe, MD  UNABLE TO FIND Apply 1 application topically daily. Silver shield gel   Yes Historical Provider, MD  doxycycline (VIBRA-TABS) 100 MG tablet Take 1 tablet (100 mg total) by mouth 2 (two) times daily. 05/15/14   Ryan Angstlugbemiga E Jegede, MD  glucose blood (TRUETEST TEST) test strip Use as instructed 05/07/14   Ryan Angstlugbemiga E Jegede, MD   BP 149/59 mmHg  Pulse 103  Temp(Src) 98.2 F (36.8 C) (Oral)  Resp 16  SpO2 100% Physical Exam  Constitutional: He is oriented to person, place, and time. He appears well-developed and well-nourished.  HENT:  Head: Normocephalic.  Mouth/Throat: Oropharynx is clear and moist.  Eyes: Conjunctivae are normal. Pupils are equal, round, and reactive to light.  Neck: Normal range of motion. Neck supple.  Cardiovascular: Regular rhythm and normal heart sounds.   Tachycardic   Pulmonary/Chest: Effort normal and breath sounds normal. No respiratory distress. He has no wheezes. He has no rales.  Abdominal: Soft.  Bowel sounds are normal. He exhibits no distension. There is no tenderness. There is no rebound and no guarding.  Musculoskeletal:  L big toe swollen and tender. Fluctuance in the medial aspect. Red streaking up the dorsal aspect of the foot. 2+ pulses   Neurological: He is alert and oriented to person, place, and time. No cranial nerve deficit. Coordination normal.  Skin: Skin is warm and dry.  Psychiatric: He has a normal mood and affect. His behavior is normal. Judgment and thought content normal.  Nursing note and vitals reviewed.   ED Course   Procedures (including critical care time) Labs Review Labs Reviewed  CBC WITH DIFFERENTIAL - Abnormal; Notable for the following:    WBC 15.8 (*)    Neutrophils Relative % 88 (*)    Neutro Abs 14.0 (*)    Lymphocytes Relative 6 (*)    All other components within normal limits  COMPREHENSIVE METABOLIC PANEL - Abnormal; Notable for the following:    Sodium 134 (*)    Chloride 94 (*)    Glucose, Bld 242 (*)    Total Protein 9.1 (*)    All other components within normal limits  I-STAT CG4 LACTIC ACID, ED    Imaging Review Dg Toe Great Left  06/08/2014   CLINICAL DATA:  Pain and swelling of the great toe without known injury; infection 4 months ago with interval healing and now parrot Re infection.  EXAM: LEFT GREAT TOE  COMPARISON:  Report of an MRI of the left foot of January 12, 2014.  FINDINGS: There are destructive bony changes of the distal phalanx of the great toe. A pathologic fracture is present proximally. The proximal phalanx is intact. The interphalangeal joint space is preserved. There is soft tissue swelling diffusely over the first digit.  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   Electronically Signed   By: Ryan  [name]   On: 06/08/2014 13:13     EKG Interpretation None      MDM   Final diagnoses:  Cellulitis and abscess of foot    Ryan [name] is a 53 y.o. male here with obvious L big toe infection. Concerned for possible osteo. He is tachy. Will do sepsis workup and xray L big toe. Will likely need IV abx and ortho consult.   2:09 PM Called Ryan [name], who plans surgery. Given vanc/zosyn. Will admit.     Ryan Canalavid H Yao, MD 06/08/14 1500

## 2014-06-09 ENCOUNTER — Encounter (HOSPITAL_COMMUNITY): Payer: Self-pay | Admitting: Orthopedic Surgery

## 2014-06-09 DIAGNOSIS — I739 Peripheral vascular disease, unspecified: Secondary | ICD-10-CM

## 2014-06-09 LAB — BASIC METABOLIC PANEL
ANION GAP: 12 (ref 5–15)
BUN: 9 mg/dL (ref 6–23)
CO2: 23 mEq/L (ref 19–32)
Calcium: 8.5 mg/dL (ref 8.4–10.5)
Chloride: 101 mEq/L (ref 96–112)
Creatinine, Ser: 0.73 mg/dL (ref 0.50–1.35)
GFR calc non Af Amer: >90 mL/min (ref >90)
Glucose, Bld: 123 mg/dL — ABNORMAL HIGH (ref 70–99)
POTASSIUM: 3.6 meq/L — AB (ref 3.7–5.3)
Sodium: 136 mEq/L — ABNORMAL LOW (ref 137–147)

## 2014-06-09 LAB — CBC
HEMATOCRIT: 32.3 % — AB (ref 39.0–52.0)
Hemoglobin: 10.6 g/dL — ABNORMAL LOW (ref 13.0–17.0)
MCH: 29.4 pg (ref 26.0–34.0)
MCHC: 32.8 g/dL (ref 30.0–36.0)
MCV: 89.7 fL (ref 78.0–100.0)
Platelets: 211 10*3/uL (ref 150–400)
RBC: 3.6 MIL/uL — AB (ref 4.22–5.81)
RDW: 13.5 % (ref 11.5–15.5)
WBC: 8.7 10*3/uL (ref 4.0–10.5)

## 2014-06-09 LAB — GLUCOSE, CAPILLARY
GLUCOSE-CAPILLARY: 99 mg/dL (ref 70–99)
GLUCOSE-CAPILLARY: 204 mg/dL — AB (ref 70–99)
GLUCOSE-CAPILLARY: 218 mg/dL — AB (ref 70–99)
Glucose-Capillary: 123 mg/dL — ABNORMAL HIGH (ref 70–99)
Glucose-Capillary: 88 mg/dL (ref 70–99)

## 2014-06-09 LAB — HIV ANTIBODY (ROUTINE TESTING W REFLEX): HIV 1&2 Ab, 4th Generation: NONREACTIVE

## 2014-06-09 LAB — HEMOGLOBIN A1C
HEMOGLOBIN A1C: 7 % — AB (ref <5.7)
Mean Plasma Glucose: 154 mg/dL — ABNORMAL HIGH (ref <117)

## 2014-06-09 MED ORDER — GLUCERNA SHAKE PO LIQD
237.0000 mL | ORAL | Status: DC
  Administered 2014-06-10: 237 mL via ORAL
  Filled 2014-06-09 (×2): qty 237

## 2014-06-09 MED ORDER — LISINOPRIL 20 MG PO TABS
20.0000 mg | ORAL_TABLET | Freq: Every day | ORAL | Status: DC
  Administered 2014-06-09 – 2014-06-11 (×3): 20 mg via ORAL
  Filled 2014-06-09 (×3): qty 1

## 2014-06-09 MED ORDER — PRO-STAT SUGAR FREE PO LIQD
30.0000 mL | Freq: Two times a day (BID) | ORAL | Status: DC
  Administered 2014-06-09 – 2014-06-10 (×2): 30 mL
  Filled 2014-06-09 (×6): qty 30

## 2014-06-09 NOTE — Progress Notes (Signed)
CARE MANAGEMENT NOTE 06/09/2014  Patient:  Christiana PellantLEONARD,Franz R   Account Number:  [id number]  Date Initiated:  06/09/2014  Documentation initiated by:  Ferdinand CavaSCHETTINO,Layia Walla  Subjective/Objective Assessment:   53 yo male admitted for amputation of Right great toe, Patient drives self, and [facility]     Action/Plan:   discharge planning   Anticipated DC Date:  06/11/2014   Anticipated DC Plan:  HOME/SELF CARE  In-house referral  Clinical Social Worker         Choice offered to / List presented to:             Status of service:  In process, will continue to follow Medicare Important Message given?   (If response is "NO", the following Medicare IM given date fields will be blank) Date Medicare IM given:   Medicare IM given by:   Date Additional Medicare IM given:   Additional Medicare IM given by:    Discharge Disposition:    Per UR Regulation:    If discussed at Long Length of Stay Meetings, dates discussed:    Comments:  06/09/14 Ferdinand CavaAndrea Schettino RN, BSN, CM Patient stated that he does not have a place to live and does not want to go to a shelter. He stated that he does not need any DME and [facility] and is confident that he can do his own dressing changes this time. He stated that [facility]. No CM needs identified at this time, will follow

## 2014-06-09 NOTE — Progress Notes (Signed)
  Orthopedics Progress Note  Subjective: I feel better today  Objective:  Filed Vitals:   06/09/14 0833  BP: 152/79  Pulse: 79  Temp: 98 F (36.7 C)  Resp: 18    General: Awake and alert  Musculoskeletal: left foot dressing intact, cast shoe in place Neurovascularly intact  Lab Results  Component Value Date   WBC 8.7 06/09/2014   HGB 10.6* 06/09/2014   HCT 32.3* 06/09/2014   MCV 89.7 06/09/2014   PLT 211 06/09/2014       Component Value Date/Time   NA 136* 06/09/2014 0532   K 3.6* 06/09/2014 0532   CL 101 06/09/2014 0532   CO2 23 06/09/2014 0532   GLUCOSE 123* 06/09/2014 0532   BUN 9 06/09/2014 0532   CREATININE 0.73 06/09/2014 0532   CALCIUM 8.5 06/09/2014 0532   GFRNONAA >90 06/09/2014 0532   GFRAA >90 06/09/2014 0532    No results found for: INR, PROTIME  Assessment/Plan: POD #1 s/p Procedure(s): AMPUTATION LEFT GREAT TOE Continue antibiotic treatment per primary team. Follow up in the office with me next week WBAT on Left  (through heel)  Almedia BallsSteven R. Ranell PatrickNorris, MD 06/09/2014 1:15 PM

## 2014-06-09 NOTE — Progress Notes (Signed)
Clinical Social Work Department BRIEF PSYCHOSOCIAL ASSESSMENT 06/09/2014  Patient:  Ryan [name],      Account Number:  [id number]     Admit date:  06/08/2014  Clinical Social Worker:  Earlie Server  Date/Time:  06/09/2014 04:00 PM  Referred by:  Physician  Date Referred:  06/09/2014 Referred for  Homelessness   Other Referral:   Interview type:  Patient Other interview type:    PSYCHOSOCIAL DATA Living Status:  ALONE Admitted from facility:   Level of care:   Primary support name:  Roger Primary support relationship to patient:  FRIEND Degree of support available:   Adequate    CURRENT CONCERNS Current Concerns  Other - See comment   Other Concerns:   Homelessness    SOCIAL WORK ASSESSMENT / PLAN CSW received referral in order to complete psychosocial assessment. CSW reviewed chart and met with patient at bedside. CSW introduced myself and explained role.    Patient reports he lives at home alone but is worried he might get evicted. Patient reports he moved in with a friend (Dot) that he cared for several years but she passed away in 2022/11/10. Patient reports that friend signed a form stating that he could have rights to the house and live there but now her dtrs (who patient reports are actually her nieces) are trying to evict patient. Patient reports he has to turn in his keys on 11/16 by 5pm. CSW asked if patient had been formally evicted and patient reports nothing has gone through the court system but dtr wrote a letter stating that her name was on the deed and he needs to leave. Patient reports he has hired a Chief Executive Officer who told him that dtr would have to formally evict patient through the court system so patient will not immediately be homeless. Patient reports he has been working with other apartments and housing in order to find permanent housing. Patient reports that the [employer] him and caused his credit score to decline so he has had  trouble finding housing. 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 Patient reports he is trying to save money but that he has not been working that often so he only has about $200 saved. Patient reports he will not stay at a shelter but understands there are no further options for housing if he does not have a steady income and no money saved.    Patient reports that he has been told he is a "single, white male" so he is not eligible for food stamps or government assistance. Patient reports he does not trust the system and knows that CSW cannot assist him. Patient reports he wants CSW to come back for a follow up visit but feels CSW cannot be helpful.    CSW will continue to follow.   Assessment/plan status:  Psychosocial Support/Ongoing Assessment of Needs Other assessment/ plan:   Information/referral to community resources:   Shelter options    PATIENT'S/FAMILY'S RESPONSE TO PLAN OF CARE: Patient alert and oriented. Patient reports he is angry because he has too many stressors and no one cares about him. Patient refuses to stay at a shelter and reports he does not want any further information. Patient has limited income and asked CSW to complete paperwork so that he could find another live-in job where he could care for the elderly but stay  in their house after they pass away. CSW explained that is out of CSW's scope but encouraged him to speak to his lawyer about legal concerns.       [place], [place] [phone]

## 2014-06-09 NOTE — Anesthesia Postprocedure Evaluation (Signed)
  Anesthesia Post-op Note  Patient: Ryan [name]  Procedure(s) Performed: Procedure(s) (LRB): AMPUTATION LEFT GREAT TOE (Left)  Patient Location: PACU  Anesthesia Type: General  Level of Consciousness: awake and alert   Airway and Oxygen Therapy: Patient Spontanous Breathing  Post-op Pain: mild  Post-op Assessment: Post-op Vital signs reviewed, Patient's Cardiovascular Status Stable, Respiratory Function Stable, Patent Airway and No signs of Nausea or vomiting  Last Vitals:  Filed Vitals:   06/09/14 0459  BP: 130/69  Pulse: 76  Temp: 36.6 C  Resp: 16    Post-op Vital Signs: stable   Complications: No apparent anesthesia complications

## 2014-06-09 NOTE — Op Note (Signed)
NAMOrland Dec:  Ryan [name], Ryan [name]              ACCOUNT NO.:  [id number]  MEDICAL RECORD NO.:  [id number]  LOCATION:  1516                         [facility]  PHYSICIAN:  Almedia BallsSteven R. Ranell PatrickNorris, M.D. DATE OF BIRTH:  1960/09/03  DATE OF PROCEDURE:  06/08/2014 DATE OF DISCHARGE:                              OPERATIVE REPORT   PREOPERATIVE DIAGNOSES:  Left great toe osteomyelitis and abscess.  POSTOPERATIVE DIAGNOSES:  Left great toe osteomyelitis and abscess.  PROCEDURE PERFORMED:  Left great toe amputation with primary closure.  ATTENDING SURGEON:  Almedia BallsSteven R. Ranell PatrickNorris, M.D.  ASSISTANT:  None.  ANESTHESIA:  General anesthesia was used.  ESTIMATED BLOOD LOSS:  Minimal.  FLUID REPLACEMENT:  500 mL crystalloid.  INSTRUMENT COUNTS:  Correct.  COMPLICATIONS:  There were no complications.  ANTIBIOTICS:  Perioperative antibiotics were given.  TOURNIQUET TIME:  15 minutes at 275 mmHg.  INDICATIONS:  The patient is a 53 year old diabetic with history of 4 months ago having a right great toe amputation by one of my partners, Dr. [name].  The patient reports a several-week history of increasing swelling and redness to his left great toe.  He has been managed this as an outpatient with oral antibiotics and observation.  He presents with worsening infection and concern for gangrene with osteomyelitis on x- ray.  The patient was admitted to Internal Medicine, Orthopedics consulted to discuss with the patient that based on extensive bone destruction and abscess that he should have an amputation as soon as possible.  The patient agreed to a great toe amputation.  Risks and benefits were discussed.  Informed consent obtained.  DESCRIPTION OF PROCEDURE:  After an adequate level of anesthesia achieved, the patient was positioned in the supine position.  A nonsterile tourniquet was placed on the left calf.  Left leg was sterilely prepped and draped in usual manner.  Time-out was called.  The leg was  then elevated for approximately 2 minutes and then the tourniquet was inflated to 275 mmHg.  A medially based incision starting along the great toe metatarsal and extending up to the level of the MTP joint and then ellipsing around the great toe was created with a 10- blade scalpel.  Dissection was carried sharply down the bone. Subperiosteal exposure was performed down to the MTP joint and the toe was disarticulated at the MTP joint and removed.  The remaining tissue was inspected and Bovie electrocautery was used to control hemostasis. Tendons were cut and allowed to retract.  Felt like, there was adequate soft tissue from the plantar surface to pull that up to the dorsal flap and effective primary care.  There was no purulence noted to this level. After a thorough irrigation, 1 L of pulsatile irrigation with normal saline, we closed with an interrupted nylon closure, felt like the skin was not under undue tension.  We went ahead and applied a compressive dressing and deflated the tourniquet to 15 minutes.  The patient will be weightbearing on his heel and nonweightbearing to the forefoot postoperatively and will be maintained on his IV antibiotic course.     Almedia BallsSteven R. Ranell PatrickNorris, M.D.     SRN/MEDQ  D:  06/08/2014  T:  06/09/2014  Job:  [id number]  cc:   Georges Lynchonald A. [name], M.D. Fax: [phone]

## 2014-06-09 NOTE — Evaluation (Signed)
Physical Therapy Evaluation Patient Details Name: Ryan [name] MRN: [id number] DOB: [birth year] Today's Date: 06/09/2014   History of Present Illness  53 yo male with complications from DM, with amputation for L great toe on 06/08/14.  PMHx:  R toe amputation, osteomyelitis, possible L subclavian steal syndrome.  Clinical Impression  Pt was seen for evaluation of gait and is not having significant difficulty getting around the hospital.  Semi compliant with therapy, using poor judgment about foot wear and has old surgical sites that are unhealed.  Pt will be recommended to go to homeless shelter after discharge due to not having his home to return to now.  Will need HHPT to check in to make sure the transition is safe and to perform functional evaluation given the limitations he now has.    Follow Up Recommendations Home health PT    Equipment Recommendations  Cane    Recommendations for Other Services       Precautions / Restrictions Precautions Precautions: Other (comment) (L foot surgery with open wound) Required Braces or Orthoses: Other Brace/Splint (cast shoe LLE) Restrictions Weight Bearing Restrictions: No      Mobility  Bed Mobility Overal bed mobility: Modified Independent                Transfers Overall transfer level: Modified independent Equipment used:  (held onto IV pole)                Ambulation/Gait Ambulation/Gait assistance: Supervision Ambulation Distance (Feet): 500 Feet Assistive device:  (IV pole) Gait Pattern/deviations: Step-through pattern;Decreased stride length;Decreased weight shift to left;Trunk flexed;Wide base of support Gait velocity: normal for condition Gait velocity interpretation: at or above normal speed for age/gender General Gait Details: Holding IV pole and walking with shoe R foot and cast boot LLE  Stairs            Wheelchair Mobility    Modified Rankin (Stroke Patients Only)       Balance  Overall balance assessment: Modified Independent                                           Pertinent Vitals/Pain Pain Assessment: No/denies pain    Home Living Family/patient expects to be discharged to:: Shelter/Homeless Living Arrangements: Alone                    Prior Function Level of Independence: Independent         Comments: used no AD previous to this surgery     Hand Dominance        Extremity/Trunk Assessment   Upper Extremity Assessment: Overall WFL for tasks assessed           Lower Extremity Assessment: Generalized weakness      Cervical / Trunk Assessment: Normal  Communication   Communication: No difficulties  Cognition Arousal/Alertness: Awake/alert Behavior During Therapy: WFL for tasks assessed/performed Overall Cognitive Status: Within Functional Limits for tasks assessed                      General Comments General comments (skin integrity, edema, etc.): Has open area on L foot from great toe amputation and has healing area on R foot but surgery was 4 months ago    Exercises        Assessment/Plan    PT Assessment Patient needs continued PT services  PT Diagnosis Generalized weakness   PT Problem List Decreased strength;Decreased range of motion;Decreased skin integrity  PT Treatment Interventions Gait training   PT Goals (Current goals can be found in the Care Plan section) Acute Rehab PT Goals Patient Stated Goal: To figure out where to live PT Goal Formulation: With patient Time For Goal Achievement: 06/10/14 Potential to Achieve Goals: Good    Frequency Other (Comment) (evaluation only)   Barriers to discharge Other (comment) (homeless)      Co-evaluation               End of Session Equipment Utilized During Treatment: Other (comment) (IV pole) Activity Tolerance: Patient tolerated treatment well Patient left: in chair;with call bell/phone within reach;with nursing/sitter  in room Nurse Communication: Mobility status         Time: [id number] PT Time Calculation (min) (ACUTE ONLY): 32 min   Charges:   PT Evaluation $Initial PT Evaluation Tier I: 1 Procedure PT Treatments $Gait Training: 8-22 mins   PT G CodesIvar Drape:          Alveda Vanhorne E 06/09/2014, 12:36 PM   Samul Dadauth Caide Campi, PT MS Acute Rehab Dept. Number: [phone]

## 2014-06-09 NOTE — Progress Notes (Signed)
INITIAL NUTRITION ASSESSMENT  DOCUMENTATION CODES Per approved criteria  -Not Applicable   INTERVENTION: -Provide Prostat liquid protein PO 30 ml BID with meals, each supplement provides 100 kcal, 15 grams protein. -Provide Glucerna Shake po daily, each supplement provides 220 kcal and 10 grams of protein -Encourage PO intake -RD to continue to monitor  NUTRITION DIAGNOSIS: Increased nutrient (protein) needs related to wound healing as evidenced by toe amputation and non-healing surgical wounds.  Goal: Pt to meet >/= 90% of their estimated nutrition needs   Monitor:  PO and supplemental intake, weight, labs, I/O's  Reason for Assessment: Consult for wound healing  Admitting Dx: Osteomyelitis  ASSESSMENT: 53 y/o male with hx of diabetes and recent right great toe amputation presents with worsening three day hx of toe swelling, discoloration, and erythema.  PO intake: 100% of CHO mod diet  Pt reports unintentional weight loss partly d/t uncontrolled blood sugars and intentional weight loss d/t lifestyle changes (1200 kcal diet and increased exercise). Pt reports keeping records of meals. Pt is trying to follow a diabetes diet at home. States he is watching his carbohydrates.  Provided pt with brief carbohydrate counting diet education. Answered any questions the patient had. RD clarified some misinformation for pt regarding protein sources, carbohydrate sources, and how high blood sugar affects wound healing.  Encouraged pt to have protein with each meal to aid in wound healing.   Pt reports homelessness and inability to get help in an outpatient setting for diabetes.   Recommended that patient try protein supplements to aid in would healing. Pt is willing to try Prostat and Glucerna, even though he is hesitant.   Nutrition focused physical exam shows no sign of depletion of muscle mass or body fat.  Labs reviewed: Low Na & K Glucose 123 -trending down  Height: Ht Readings  from Last 1 Encounters:  05/07/14 5\' 11"  (1.803 m)    Weight: Wt Readings from Last 1 Encounters:  05/26/14 165 lb (74.844 kg)    Ideal Body Weight: 172 lb  % Ideal Body Weight: 96%  Wt Readings from Last 10 Encounters:  05/26/14 165 lb (74.844 kg)  05/07/14 167 lb (75.751 kg)  04/14/14 157 lb (71.215 kg)  03/24/14 163 lb 8 oz (74.163 kg)  02/04/14 167 lb (75.751 kg)  02/03/14 165 lb (74.844 kg)  01/11/14 163 lb 11.2 oz (74.254 kg)    Usual Body Weight: 280 lb -per pt (2-3 years ago)  % Usual Body Weight: 59%  BMI: 23.1  Estimated Nutritional Needs: Kcal: 1850-2050 Protein: 105-115g Fluid: 1.9L/day  Skin: toe amputation on left foot  Diet Order: Diet Carb Modified  EDUCATION NEEDS: -Education needs addressed   Intake/Output Summary (Last 24 hours) at 06/09/14 1154 Last data filed at 06/09/14 0500  Gross per 24 hour  Intake 1759.58 ml  Output      0 ml  Net 1759.58 ml    Last BM: 11/9  Labs:   Recent Labs Lab 06/08/14 1252 06/09/14 0532  NA 134* 136*  K 4.2 3.6*  CL 94* 101  CO2 26 23  BUN 13 9  CREATININE 0.75 0.73  CALCIUM 9.5 8.5  GLUCOSE 242* 123*    CBG (last 3)   Recent Labs  06/08/14 2151 06/09/14 0449 06/09/14 0741  GLUCAP 115* 123* 99    Scheduled Meds: . cefTRIAXone (ROCEPHIN)  IV  2 g Intravenous Q24H   And  . metroNIDAZOLE  500 mg Oral 3 times per day  . chlorhexidine  60 mL Topical Once  . ferrous sulfate  325 mg Oral TID PC  . heparin  5,000 Units Subcutaneous 3 times per day  . insulin aspart  0-15 Units Subcutaneous TID WC  . vancomycin  1,000 mg Intravenous Q8H  . vitamin B-12  1,000 mcg Oral Daily    Continuous Infusions: . sodium chloride Stopped (06/08/14 2035)    Past Medical History  Diagnosis Date  . Diabetes mellitus without complication     Past Surgical History  Procedure Laterality Date  . Amputation Right 01/13/2014    Procedure: Right great toe amputation;  Surgeon: Jacki Conesonald A Gioffre, MD;   Location: [facility];  Service: Orthopedics;  Laterality: Right;  . Knee surgery Right   . Arm surgery Left     due to broken arm    Tilda FrancoLindsey Atanacio Melnyk, MS, RD, LDN Pager: [phone] After Hours Pager: [phone]

## 2014-06-09 NOTE — Progress Notes (Signed)
VASCULAR LAB PRELIMINARY  ARTERIAL  ABI completed: ABI is within normal limits on the right and in moderate arterial disease range on the left. This appears unchanged from ABI in 12/2013. The brachial pressures demonstrate a 31mmHg difference, with the left being lower. (In previous ABI, there was a 22 mmHg difference). This is suggestive of left subclavian steal syndrome. Could not obtain TBI due to bilateral amputation of great toes.    RIGHT    LEFT    PRESSURE WAVEFORM  PRESSURE WAVEFORM  BRACHIAL 165 Tri BRACHIAL 134 Bi  DP   DP    AT 197 Tri AT 119 Bi  PT 172 Tri PT 120 Damp mono  PER   PER    GREAT TOE  NA GREAT TOE  NA    RIGHT LEFT  ABI 1.19 0.73     Ryan [name], RDMS, RVT  06/09/2014, 11:21 AM

## 2014-06-09 NOTE — Progress Notes (Signed)
[facility] PROGRESS NOTE  Ryan PellantRonald R Narciso BJY:[id number] DOB: 04-28-1961 DOA: 06/08/2014 PCP: Jeanann LewandowskyJEGEDE, OLUGBEMIGA, MD  Assessment/Plan: 1-SIRS due to left big toe Osteomyelitis: patient with erythema extending towards his foot, WBC's of 15.6 and HR 112. -patient foot x-ray demonstrating osteomyelitis and he had hx of MRSA -patient take to OR on 11/9 for amputation; recovering well from sx -SIRS picture is now resolved -after discussing with ID will continue for 24 hours after amputation with current antibiotics and then discharge on doxycycline and Augmentin for 10 more days. -patient will follow with orthopedic service in 1 week -weight bearing on his heel only  2-Diabetes mellitus: most recent A1C 6.9 at the beginning of October -will continue SSI -continue low carb diet  3-Leukocytosis: due to #1 -resolved with IVF;s, abx's and amputation  4-HTN (hypertension):  -will start treatment with lisinopril -continue PRN hydralazine  5-screening: HIV antibody negative  6-hyponatremia:  Resolved with IVF's  Code Status: Full Family Communication: no family at bedside Disposition Plan: home in next 24 hours if remains stable   Consultants:  Orthopedic service (Dr. Ranell PatrickNorris)  Dr. Luciana Axeomer, ID (curbside for rec's on outpatient antibiotics therapy)  Procedures:  Left great toe amputation 11/09  See below for x-ray reports   Antibiotics:  Vancomycin  Rocephin   Flagyl    HPI/Subjective: No CP, no SOB, pain is well controlled. Feeling better and no fever.  Objective: Filed Vitals:   06/09/14 1421  BP: 153/79  Pulse: 78  Temp: 98 F (36.7 C)  Resp: 20    Intake/Output Summary (Last 24 hours) at 06/09/14 1433 Last data filed at 06/09/14 1200  Gross per 24 hour  Intake 2099.58 ml  Output      0 ml  Net 2099.58 ml   There were no vitals filed for this visit.  Exam:   General:  Afebrile, feeling better; no pain  Cardiovascular: RRR, positive SEM  (tricuspid valvular area); no murmurs or gallops  Respiratory: CTA bilaterally  Abdomen: soft, NT, ND, positive BS  Skin: left foot with clean compression dressing and draco shoe in place; no erythema on his legs, no swelling. Patient with right first toe amputation, wound w/o drainage and healing properly.   Musculoskeletal: no edema or cyanosis  Data Reviewed: Basic Metabolic Panel:  Recent Labs Lab 06/08/14 1252 06/09/14 0532  NA 134* 136*  K 4.2 3.6*  CL 94* 101  CO2 26 23  GLUCOSE 242* 123*  BUN 13 9  CREATININE 0.75 0.73  CALCIUM 9.5 8.5   Liver Function Tests:  Recent Labs Lab 06/08/14 1252 06/08/14 1844  AST 15  --   ALT 15  --   ALKPHOS 116  --   BILITOT 0.5  --   PROT 9.1*  --   ALBUMIN 3.5 2.8*   CBC:  Recent Labs Lab 06/08/14 1252 06/09/14 0532  WBC 15.8* 8.7  NEUTROABS 14.0*  --   HGB 13.3 10.6*  HCT 40.3 32.3*  MCV 90.0 89.7  PLT 292 211   BNP (last 3 results)  Recent Labs  01/11/14 1855  PROBNP 148.5*   CBG:  Recent Labs Lab 06/08/14 2109 06/08/14 2151 06/09/14 0449 06/09/14 0741 06/09/14 1213  GLUCAP 109* 115* 123* 99 204*    Recent Results (from the past 240 hour(s))  Blood Cultures x 2 sites     Status: None (Preliminary result)   Collection Time: 06/08/14  6:44 PM  Result Value Ref Range Status   Specimen Description BLOOD RIGHT ARM  Final   Special Requests BOTTLES DRAWN AEROBIC AND ANAEROBIC 10CC  Final   Culture  Setup Time   Final    06/08/2014 22:39 Performed at [facility]    Culture   Final           BLOOD CULTURE RECEIVED NO GROWTH TO DATE CULTURE WILL BE HELD FOR 5 DAYS BEFORE ISSUING A FINAL NEGATIVE REPORT Performed at [facility]    Report Status PENDING  Incomplete  Blood Cultures x 2 sites     Status: None (Preliminary result)   Collection Time: 06/08/14  6:53 PM  Result Value Ref Range Status   Specimen Description BLOOD RIGHT HAND  Final   Special Requests BOTTLES DRAWN  AEROBIC AND ANAEROBIC 10CC  Final   Culture  Setup Time   Final    06/08/2014 22:36 Performed at [facility]    Culture   Final           BLOOD CULTURE RECEIVED NO GROWTH TO DATE CULTURE WILL BE HELD FOR 5 DAYS BEFORE ISSUING A FINAL NEGATIVE REPORT Performed at [facility]    Report Status PENDING  Incomplete  Surgical pcr screen     Status: None   Collection Time: 06/08/14  7:39 PM  Result Value Ref Range Status   MRSA, PCR NEGATIVE NEGATIVE Final   Staphylococcus aureus NEGATIVE NEGATIVE Final    Comment:        The Xpert SA Assay (FDA approved for NASAL specimens in patients over 51 years of age), is one component of a comprehensive surveillance program.  Test performance has been validated by [facility] for patients greater than or equal to 51 year old. It is not intended to diagnose infection nor to guide or monitor treatment.      Studies: Dg Toe Great Left  06/08/2014   CLINICAL DATA:  Pain and swelling of the great toe without known injury; infection 4 months ago with interval healing and now parrot Re infection.  EXAM: LEFT GREAT TOE  COMPARISON:  Report of an MRI of the left foot of January 12, 2014.  FINDINGS: There are destructive bony changes of the distal phalanx of the great toe. A pathologic fracture is present proximally. The proximal phalanx is intact. The interphalangeal joint space is preserved. There is soft tissue swelling diffusely over the first digit.  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   Electronically Signed   By: David  [place]az[place]   On: 06/08/2014 13:13    Scheduled Meds: . cefTRIAXone (ROCEPHIN)  IV  2 g Intravenous Q24H   And  . metroNIDAZOLE  500 mg Oral 3 times per day  . chlorhexidine  60 mL Topical Once  . feeding supplement (PRO-STAT SUGAR FREE 64)  30 mL Per Tube BID WC  . ferrous sulfate  325 mg Oral TID PC   . heparin  5,000 Units Subcutaneous 3 times per day  . insulin aspart  0-15 Units Subcutaneous TID WC  . vancomycin  1,000 mg Intravenous Q8H  . vitamin B-12  1,000 mcg Oral Daily   Continuous Infusions: . sodium chloride Stopped (06/08/14 2035)    Principal Problem:   Osteomyelitis Active Problems:   Diabetes mellitus   Leukocytosis   HTN (hypertension)   SIRS (systemic inflammatory response syndrome)   Osteomyelitis of left foot   Diabetes mellitus type 2 with complications  Osteomyelitis of toe    Time spent: 30 minutes    Vassie Loll  [facility] Pager [phone]. If 7PM-7AM, please contact night-coverage at [email or web address], password [facility] 06/09/2014, 2:33 PM  LOS: 1 day

## 2014-06-09 NOTE — Discharge Instructions (Signed)
Weight bear through the heel only (WBAT).  Use post op shoe.  Keep the left foot elevated at all times.  Keep dressing dry and intact  Follow up in the office with Dr Ranell PatrickNorris in one week  [phone]

## 2014-06-10 LAB — BASIC METABOLIC PANEL
Anion gap: 10 (ref 5–15)
BUN: 9 mg/dL (ref 6–23)
CO2: 24 meq/L (ref 19–32)
Calcium: 8.8 mg/dL (ref 8.4–10.5)
Chloride: 100 mEq/L (ref 96–112)
Creatinine, Ser: 0.65 mg/dL (ref 0.50–1.35)
GFR calc Af Amer: >90 mL/min (ref >90)
GFR calc non Af Amer: >90 mL/min (ref >90)
GLUCOSE: 166 mg/dL — AB (ref 70–99)
POTASSIUM: 3.9 meq/L (ref 3.7–5.3)
SODIUM: 134 meq/L — AB (ref 137–147)

## 2014-06-10 LAB — GLUCOSE, CAPILLARY
GLUCOSE-CAPILLARY: 182 mg/dL — AB (ref 70–99)
GLUCOSE-CAPILLARY: 142 mg/dL — AB (ref 70–99)
Glucose-Capillary: 101 mg/dL — ABNORMAL HIGH (ref 70–99)
Glucose-Capillary: 133 mg/dL — ABNORMAL HIGH (ref 70–99)
Glucose-Capillary: 159 mg/dL — ABNORMAL HIGH (ref 70–99)
Glucose-Capillary: 166 mg/dL — ABNORMAL HIGH (ref 70–99)

## 2014-06-10 LAB — CBC
HCT: 33.8 % — ABNORMAL LOW (ref 39.0–52.0)
HEMOGLOBIN: 11.4 g/dL — AB (ref 13.0–17.0)
MCH: 29.8 pg (ref 26.0–34.0)
MCHC: 33.7 g/dL (ref 30.0–36.0)
MCV: 88.5 fL (ref 78.0–100.0)
Platelets: 266 10*3/uL (ref 150–400)
RBC: 3.82 MIL/uL — ABNORMAL LOW (ref 4.22–5.81)
RDW: 13.2 % (ref 11.5–15.5)
WBC: 8.9 10*3/uL (ref 4.0–10.5)

## 2014-06-10 LAB — RETICULOCYTES
RBC.: 3.82 MIL/uL — ABNORMAL LOW (ref 4.22–5.81)
Retic Count, Absolute: 30.6 10*3/uL (ref 19.0–186.0)
Retic Ct Pct: 0.8 % (ref 0.4–3.1)

## 2014-06-10 LAB — IRON AND TIBC
Iron: 31 ug/dL — ABNORMAL LOW (ref 42–135)
SATURATION RATIOS: 18 % — AB (ref 20–55)
TIBC: 175 ug/dL — AB (ref 215–435)
UIBC: 144 ug/dL (ref 125–400)

## 2014-06-10 LAB — FERRITIN: Ferritin: 353 ng/mL — ABNORMAL HIGH (ref 22–322)

## 2014-06-10 LAB — FOLATE

## 2014-06-10 LAB — VITAMIN B12: VITAMIN B 12: 464 pg/mL (ref 211–911)

## 2014-06-10 MED ORDER — POTASSIUM CHLORIDE CRYS ER 20 MEQ PO TBCR
40.0000 meq | EXTENDED_RELEASE_TABLET | Freq: Once | ORAL | Status: AC
  Administered 2014-06-10: 40 meq via ORAL
  Filled 2014-06-10: qty 2

## 2014-06-10 MED ORDER — POTASSIUM CHLORIDE CRYS ER 20 MEQ PO TBCR: 40.0000 meq | EXTENDED_RELEASE_TABLET | Freq: Two times a day (BID) | ORAL | Status: DC

## 2014-06-10 NOTE — Progress Notes (Signed)
   Subjective: 2 Days Post-Op Procedure(s) (LRB): AMPUTATION LEFT GREAT TOE (Left)  Pt c/o occasional chills but minimal pain to left foot Continues on IV antibiotics Patient reports pain as mild.  Objective:   VITALS:   Filed Vitals:   06/10/14 0543  BP: 141/72  Pulse: 75  Temp: 98.4 F (36.9 C)  Resp: 20    Left foot dressing changed Well healing incision from to amputation Drastically reduced erythema and edema to mid foot nv intact to other toes  LABS  Recent Labs  06/08/14 1252 06/09/14 0532  HGB 13.3 10.6*  HCT 40.3 32.3*  WBC 15.8* 8.7  PLT 292 211     Recent Labs  06/08/14 1252 06/09/14 0532  NA 134* 136*  K 4.2 3.6*  BUN 13 9  CREATININE 0.75 0.73  GLUCOSE 242* 123*     Assessment/Plan: 2 Days Post-Op Procedure(s) (LRB): AMPUTATION LEFT GREAT TOE (Left) May discharge once medically stable and social situation is worked out with housing Will continue monitor progress F/u in 2 weeks after discharge in our office     [employer] MillsBrad Dray Dente, MPAS, PA-C  06/10/2014, 7:16 AM

## 2014-06-10 NOTE — Progress Notes (Signed)
CARE MANAGEMENT NOTE 06/10/2014  Patient:  Ryan PellantLEONARD,Elias R   Account Number:  [id number]  Date Initiated:  06/09/2014  Documentation initiated by:  Ferdinand CavaSCHETTINO,Danny Zimny  Subjective/Objective Assessment:   53 yo male admitted for amputation of Right great toe, Patient drives self, and [facility]     Action/Plan:   discharge planning   Anticipated DC Date:  06/11/2014   Anticipated DC Plan:  HOME/SELF CARE  In-house referral  Clinical Social Worker      DC Planning Services  CM consult      Choice offered to / List presented to:          [facility]  [facility] Patient Refused      Status of service:  Completed, signed off Medicare Important Message given?   (If response is "NO", the following Medicare IM given date fields will be blank) Date Medicare IM given:   Medicare IM given by:   Date Additional Medicare IM given:   Additional Medicare IM given by:    Discharge Disposition:    Per UR Regulation:    If discussed at Long Length of Stay Meetings, dates discussed:    Comments:  06/10/14 Ferdinand CavaAndrea Schettino RN, BSN, [place] [phone] Patient stated that [facility], he also stated that [facility] and can obtain his prescriptions at an affordable rate thorugh the clinic. No CM needs identified.  06/09/14 Ferdinand CavaAndrea Schettino RN, BSN, CM Patient stated that he does not have a place to live and does not want to go to a shelter. He stated that he does not need any DME and [facility] and is confident that he can do his own dressing changes this time. He stated that [facility]. No CM needs identified at this time, will follow

## 2014-06-10 NOTE — Progress Notes (Addendum)
[facility] PROGRESS NOTE  Christiana PellantRonald R Verville MVH:[id number] DOB: [birth year] DOA: 06/08/2014 PCP: Jeanann LewandowskyJEGEDE, OLUGBEMIGA, MD Interim summary: 53 y.o. male with h/o of DM came in for left great toe osteomyelitis, underwent amputation on 11/9 on IV antibiotics, plan to d/c to home tomorrow on oral antibiotics.  Assessment/Plan: 1-SIRS due to left big toe Osteomyelitis:  Improving. On arrival to ED, patient foot x-ray showed osteomyelitis and he had hx of MRSA. He underwent amputation of the great toe on 11/9 and his symptoms has improved. After discussing with ID will continue for 24 hours after amputation with current antibiotics and then discharge on doxycycline and Augmentin for 10 more days. Outpatient follow up with orthopedics in 2 weeks. weight bearing on his heel only  2-Diabetes mellitus: most recent A1C 6.9 at the beginning of October -will continue SSI -continue low carb diet CBG (last 3)   Recent Labs  06/10/14 0022 06/10/14 0417 06/10/14 0720  GLUCAP 166* 101* 182*      3-Leukocytosis: due to #1 -resolved with IVF;s, abx's and amputation  4-HTN (hypertension):  Better controlled resume home medications.  -continue PRN hydralazine  5-screening: HIV antibody negative  6-hyponatremia:  Resolved with IVF's  Anemia: Probably related to anemia of chronic disease vs anemia from blood loss from amputation.  Repeat cbc today. And get anemia panel  Hypokalemia: replete as needed.   Code Status: Full Family Communication: no family at bedside Disposition Plan: home in next 24 hours if remains stable   Consultants:  Orthopedic service (Dr. Ranell PatrickNorris)  Dr. Luciana Axeomer, ID (curbside for rec's on outpatient antibiotics therapy)  Procedures:  Left great toe amputation 11/09  See below for x-ray reports   Antibiotics:  Vancomycin  Rocephin   Flagyl    HPI/Subjective: No CP, no SOB, pain is well controlled. Feeling better and no fever.  Objective: Filed Vitals:    06/10/14 0543  BP: 141/72  Pulse: 75  Temp: 98.4 F (36.9 C)  Resp: 20    Intake/Output Summary (Last 24 hours) at 06/10/14 1028 Last data filed at 06/10/14 0801  Gross per 24 hour  Intake    340 ml  Output      0 ml  Net    340 ml   There were no vitals filed for this visit.  Exam:   General:  Afebrile, feeling better; no pain  Cardiovascular: RRR, positive SEM (tricuspid valvular area); no murmurs or gallops  Respiratory: CTA bilaterally  Abdomen: soft, NT, ND, positive BS  Skin: left foot with clean compression dressing and draco shoe in place; no erythema on his legs, no swelling. Patient with right first toe amputation, wound w/o drainage and healing properly.   Musculoskeletal: no edema or cyanosis  Data Reviewed: Basic Metabolic Panel:  Recent Labs Lab 06/08/14 1252 06/09/14 0532  NA 134* 136*  K 4.2 3.6*  CL 94* 101  CO2 26 23  GLUCOSE 242* 123*  BUN 13 9  CREATININE 0.75 0.73  CALCIUM 9.5 8.5   Liver Function Tests:  Recent Labs Lab 06/08/14 1252 06/08/14 1844  AST 15  --   ALT 15  --   ALKPHOS 116  --   BILITOT 0.5  --   PROT 9.1*  --   ALBUMIN 3.5 2.8*   CBC:  Recent Labs Lab 06/08/14 1252 06/09/14 0532  WBC 15.8* 8.7  NEUTROABS 14.0*  --   HGB 13.3 10.6*  HCT 40.3 32.3*  MCV 90.0 89.7  PLT 292 211   BNP (  last 3 results)  Recent Labs  01/11/14 1855  PROBNP 148.5*   CBG:  Recent Labs Lab 06/09/14 1643 06/09/14 2009 06/10/14 0022 06/10/14 0417 06/10/14 0720  GLUCAP 88 218* 166* 101* 182*    Recent Results (from the past 240 hour(s))  Blood Cultures x 2 sites     Status: None (Preliminary result)   Collection Time: 06/08/14  6:44 PM  Result Value Ref Range Status   Specimen Description BLOOD RIGHT ARM  Final   Special Requests BOTTLES DRAWN AEROBIC AND ANAEROBIC 10CC  Final   Culture  Setup Time   Final    06/08/2014 22:39 Performed at [facility]    Culture   Final           BLOOD CULTURE  RECEIVED NO GROWTH TO DATE CULTURE WILL BE HELD FOR 5 DAYS BEFORE ISSUING A FINAL NEGATIVE REPORT Performed at [facility]    Report Status PENDING  Incomplete  Blood Cultures x 2 sites     Status: None (Preliminary result)   Collection Time: 06/08/14  6:53 PM  Result Value Ref Range Status   Specimen Description BLOOD RIGHT HAND  Final   Special Requests BOTTLES DRAWN AEROBIC AND ANAEROBIC 10CC  Final   Culture  Setup Time   Final    06/08/2014 22:36 Performed at [facility]    Culture   Final           BLOOD CULTURE RECEIVED NO GROWTH TO DATE CULTURE WILL BE HELD FOR 5 DAYS BEFORE ISSUING A FINAL NEGATIVE REPORT Performed at [facility]    Report Status PENDING  Incomplete  Surgical pcr screen     Status: None   Collection Time: 06/08/14  7:39 PM  Result Value Ref Range Status   MRSA, PCR NEGATIVE NEGATIVE Final   Staphylococcus aureus NEGATIVE NEGATIVE Final    Comment:        The Xpert SA Assay (FDA approved for NASAL specimens in patients over 85 years of age), is one component of a comprehensive surveillance program.  Test performance has been validated by [facility] for patients greater than or equal to 43 year old. It is not intended to diagnose infection nor to guide or monitor treatment.      Studies: Dg Toe Great Left  06/08/2014   CLINICAL DATA:  Pain and swelling of the great toe without known injury; infection 4 months ago with interval healing and now parrot Re infection.  EXAM: LEFT GREAT TOE  COMPARISON:  Report of an MRI of the left foot of January 12, 2014.  FINDINGS: There are destructive bony changes of the distal phalanx of the great toe. A pathologic fracture is present proximally. The proximal phalanx is intact. The interphalangeal joint space is preserved. There is soft tissue swelling diffusely over the first digit.  IMPRESSION: Osteomyelitis of the distal phalanx of the left great toe with pathologic fracture through the  proximal aspect of the distal phalangeal shaft.   Electronically Signed   By: David  [place]   On: 06/08/2014 13:13    Scheduled Meds: . cefTRIAXone (ROCEPHIN)  IV  2 g Intravenous Q24H   And  . metroNIDAZOLE  500 mg Oral 3 times per day  . chlorhexidine  60 mL Topical Once  . feeding supplement (GLUCERNA SHAKE)  237 mL Oral Q24H  . feeding supplement (PRO-STAT SUGAR FREE 64)  30 mL Per Tube BID WC  . ferrous sulfate  325 mg Oral  TID PC  . heparin  5,000 Units Subcutaneous 3 times per day  . insulin aspart  0-15 Units Subcutaneous TID WC  . lisinopril  20 mg Oral Daily  . potassium chloride  40 mEq Oral Once  . vancomycin  1,000 mg Intravenous Q8H  . vitamin B-12  1,000 mcg Oral Daily   Continuous Infusions: . sodium chloride Stopped (06/08/14 2035)    Principal Problem:   Osteomyelitis Active Problems:   Diabetes mellitus   Leukocytosis   HTN (hypertension)   SIRS (systemic inflammatory response syndrome)   Osteomyelitis of left foot   Diabetes mellitus type 2 with complications   Osteomyelitis of toe    Time spent: 30 minutes    Gailyn Crook  [facility] Pager [phone] If 7PM-7AM, please contact night-coverage at [email or web address], password [email or web address] 06/10/2014, 10:28 AM  LOS: 2 days

## 2014-06-10 NOTE — Progress Notes (Signed)
Clinical Social Work  CSW met with patient at bedside to provide resources. Patient reports he plans on returning home at DC until he can find permanent housing. CSW provided patient with low income housing list and patient agreeable to review list and save money in order to pay for deposit and rent. CSW also provided patient with [employer]" so that patient could have free meals since he has been financially stressed and unable to qualify for food stamps. Patient thanked CSW for resources and reports he will follow up after he DC from the hospital.   CSW will continue to follow to assist as needed.  [place], [place] [phone]

## 2014-06-11 LAB — GLUCOSE, CAPILLARY
GLUCOSE-CAPILLARY: 120 mg/dL — AB (ref 70–99)
GLUCOSE-CAPILLARY: 150 mg/dL — AB (ref 70–99)
GLUCOSE-CAPILLARY: 159 mg/dL — AB (ref 70–99)
Glucose-Capillary: 120 mg/dL — ABNORMAL HIGH (ref 70–99)

## 2014-06-11 MED ORDER — PRO-STAT SUGAR FREE PO LIQD: 30.0000 mL | Freq: Two times a day (BID) | ORAL | Status: DC

## 2014-06-11 MED ORDER — OXYCODONE HCL 5 MG PO TABS: 5.0000 mg | ORAL_TABLET | ORAL | Status: DC | PRN

## 2014-06-11 MED ORDER — DOXYCYCLINE HYCLATE 100 MG PO TABS: 100.0000 mg | ORAL_TABLET | Freq: Two times a day (BID) | ORAL | Status: DC

## 2014-06-11 MED ORDER — AMOXICILLIN-POT CLAVULANATE 875-125 MG PO TABS: 1.0000 | ORAL_TABLET | Freq: Two times a day (BID) | ORAL | Status: DC

## 2014-06-11 MED ORDER — GLUCERNA SHAKE PO LIQD: 237.0000 mL | ORAL | Status: DC

## 2014-06-11 MED ORDER — LISINOPRIL 20 MG PO TABS: 20.0000 mg | ORAL_TABLET | Freq: Every day | ORAL | Status: DC

## 2014-06-11 NOTE — Progress Notes (Signed)
Clinical Social Work  CSW followed up with patient, answering further questions patient had about housing. Patient wanted more resources or listings for housing, CSW gave patient more detailed list, patient appreciative. Patient plans to stop by some of the listed housing options that CSW gave patient when DC. Patient will be going home and had no further needs.  CSW is signing off, but if needed for further questions may be contacted at [phone].  Hampton AbbotKadijah Grant BSW Intern    Reviewed by: Unk LightningHolly Jalasia Eskridge, LCSW

## 2014-06-11 NOTE — Discharge Summary (Signed)
Physician Discharge Summary  Christiana PellantRonald R Titus AOZ:[id number] DOB: November 26, 1960 DOA: 06/08/2014  PCP: Jeanann LewandowskyJEGEDE, OLUGBEMIGA, MD  Admit date: 06/08/2014 Discharge date: 06/11/2014  Time spent: 30 minutes  Recommendations for Outpatient Follow-up:  1. Follow up with orthopedics as recommended.   Discharge Diagnoses:  Principal Problem:   Osteomyelitis Active Problems:   Diabetes mellitus   Leukocytosis   HTN (hypertension)   SIRS (systemic inflammatory response syndrome)   Osteomyelitis of left foot   Diabetes mellitus type 2 with complications   Osteomyelitis of toe   Discharge Condition: improved  Diet recommendation: low sodium/ carb modified diet  Filed Weights   06/10/14 1100  Weight: 74.254 kg (163 lb 11.2 oz)    History of present illness:  53 year old male with h/o dm, osteomyelitis of the left great toe, underwent amputation on 11/9 on IV antibiotic currently , plan to discharge to day on oral antibiotics.   Hospital Course:  1-SIRS due to left big toe Osteomyelitis:  Improving. On arrival to ED, patient foot x-ray showed osteomyelitis and he had hx of MRSA. He underwent amputation of the great toe on 11/9 and his symptoms has improved. After discussing with ID will continue for 24 hours after amputation with current antibiotics and then discharge on doxycycline and Augmentin for 10 more days. Outpatient follow up with orthopedics in 2 weeks. weight bearing on his heel only  2-Diabetes mellitus: most recent A1C 6.9 at the beginning of October Resume levemir.  -continue low carb diet CBG (last 3)   Recent Labs (last 2 labs)      Recent Labs  06/10/14 0022 06/10/14 0417 06/10/14 0720  GLUCAP 166* 101* 182*        3-Leukocytosis: due to #1 -resolved with IVF;s, abx's and amputation  4-HTN (hypertension):  Better controlled resume home medications.    5-screening: HIV antibody negative  6-hyponatremia:  Resolved with  IVF's  Anemia: Probably related to anemia of chronic disease vs anemia from blood loss from amputation.  Repeat cbc today showed baseline hemoglobin.   Hypokalemia: repleted as needed   Procedures:  amputation  Consultations:  orthopedics  Discharge Exam: Filed Vitals:   06/11/14 0520  BP: 154/83  Pulse: 80  Temp: 98.1 F (36.7 C)  Resp: 18    General: alert afebrile comfortable Cardiovascular: s1s2 Respiratory: ctab  Discharge Instructions You were cared for by a hospitalist during your hospital stay. If you have any questions about your discharge medications or the care you received while you were in the hospital after you are discharged, you can call the unit and asked to speak with the hospitalist on call if the hospitalist that took care of you is not available. Once you are discharged, your primary care physician will handle any further medical issues. Please note that NO REFILLS for any discharge medications will be authorized once you are discharged, as it is imperative that you return to your primary care physician (or establish a relationship with a primary care physician if you do not have one) for your aftercare needs so that they can reassess your need for medications and monitor your lab values.  Discharge Instructions    Diet Carb Modified    Complete by:  As directed      Discharge instructions    Complete by:  As directed   Follow up with PCP in one week.  Follow up with orthopedics as recommended.     Weight bearing as tolerated    Complete by:  As directed   Laterality:  left  Extremity:  Lower          Current Discharge Medication List    START taking these medications   Details  Amino Acids-Protein Hydrolys (FEEDING SUPPLEMENT, PRO-STAT SUGAR FREE 64,) LIQD Place 30 mLs into feeding tube 2 (two) times daily with a meal. Qty: 900 mL, Refills: 0    amoxicillin-clavulanate (AUGMENTIN) 875-125 MG per tablet Take 1 tablet by mouth 2 (two) times  daily. Qty: 18 tablet, Refills: 0    feeding supplement, GLUCERNA SHAKE, (GLUCERNA SHAKE) LIQD Take 237 mLs by mouth daily. Refills: 0    lisinopril (PRINIVIL,ZESTRIL) 20 MG tablet Take 1 tablet (20 mg total) by mouth daily. Qty: 30 tablet, Refills: 0    oxyCODONE (OXY IR/ROXICODONE) 5 MG immediate release tablet Take 1 tablet (5 mg total) by mouth every 4 (four) hours as needed for moderate pain. Qty: 10 tablet, Refills: 0      CONTINUE these medications which have CHANGED   Details  doxycycline (VIBRA-TABS) 100 MG tablet Take 1 tablet (100 mg total) by mouth 2 (two) times daily. Qty: 18 tablet, Refills: 0      CONTINUE these medications which have NOT CHANGED   Details  Cyanocobalamin (B-12 PO) Take 1 tablet by mouth daily.    ferrous sulfate 325 (65 FE) MG tablet Take 1 tablet (325 mg total) by mouth 3 (three) times daily after meals. Qty: 90 tablet, Refills: 1    insulin detemir (LEVEMIR) 100 UNIT/ML injection Inject 0.1 mLs (10 Units total) into the skin at bedtime. Qty: 4 vial, Refills: 3    Insulin Syringe-Needle U-100 30G X 5/16" 0.5 ML MISC 1 Syringe by Does not apply route daily. Qty: 100 each, Refills: 12   Associated Diagnoses: Type 2 diabetes mellitus without complication    TRUEPLUS LANCETS 33G MISC 1 each by Does not apply route 3 (three) times daily before meals. Qty: 100 each, Refills: 1    UNABLE TO FIND Apply 1 application topically daily. Silver shield gel    glucose blood (TRUETEST TEST) test strip Use as instructed Qty: 100 each, Refills: 12   Associated Diagnoses: Type 2 diabetes mellitus without complication       Allergies  Allergen Reactions  . Metformin And Related Other (See Comments)    Pt states that metformin made sugars fall into 30's   Follow-up Information    Follow up with NORRIS,STEVEN R, MD In 1 week.   Specialty:  Orthopedic Surgery   Why:  [phone]   Contact information:   [address] [place]  [address] [phone]       Follow up with Jeanann Lewandowsky, MD. Schedule an appointment as soon as possible for a visit in 1 week.   Specialty:  Internal Medicine   Contact information:   [address] [place] [address] [phone]        The results of significant diagnostics from this hospitalization (including imaging, microbiology, ancillary and laboratory) are listed below for reference.    Significant Diagnostic Studies: Dg Toe Great Left  06/08/2014   CLINICAL DATA:  Pain and swelling of the great toe without known injury; infection 4 months ago with interval healing and now parrot Re infection.  EXAM: LEFT GREAT TOE  COMPARISON:  Report of an MRI of the left foot of January 12, 2014.  FINDINGS: There are destructive bony changes of the distal phalanx of the great toe. A pathologic fracture is present  proximally. The proximal phalanx is intact. The interphalangeal joint space is preserved. There is soft tissue swelling diffusely over the first digit.  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   Electronically Signed   By: David  [place]   On: 06/08/2014 13:13    Microbiology: Recent Results (from the past 240 hour(s))  Blood Cultures x 2 sites     Status: None (Preliminary result)   Collection Time: 06/08/14  6:44 PM  Result Value Ref Range Status   Specimen Description BLOOD RIGHT ARM  Final   Special Requests BOTTLES DRAWN AEROBIC AND ANAEROBIC 10CC  Final   Culture  Setup Time   Final    06/08/2014 22:39 Performed at [facility]    Culture   Final           BLOOD CULTURE RECEIVED NO GROWTH TO DATE CULTURE WILL BE HELD FOR 5 DAYS BEFORE ISSUING A FINAL NEGATIVE REPORT Performed at [facility]    Report Status PENDING  Incomplete  Blood Cultures x 2 sites     Status: None (Preliminary result)   Collection Time: 06/08/14  6:53 PM  Result  Value Ref Range Status   Specimen Description BLOOD RIGHT HAND  Final   Special Requests BOTTLES DRAWN AEROBIC AND ANAEROBIC 10CC  Final   Culture  Setup Time   Final    06/08/2014 22:36 Performed at [facility]    Culture   Final           BLOOD CULTURE RECEIVED NO GROWTH TO DATE CULTURE WILL BE HELD FOR 5 DAYS BEFORE ISSUING A FINAL NEGATIVE REPORT Performed at [facility]    Report Status PENDING  Incomplete  Surgical pcr screen     Status: None   Collection Time: 06/08/14  7:39 PM  Result Value Ref Range Status   MRSA, PCR NEGATIVE NEGATIVE Final   Staphylococcus aureus NEGATIVE NEGATIVE Final    Comment:        The Xpert SA Assay (FDA approved for NASAL specimens in patients over 91 years of age), is one component of a comprehensive surveillance program.  Test performance has been validated by [employer] for patients greater than or equal to 2 year old. It is not intended to diagnose infection nor to guide or monitor treatment.      Labs: Basic Metabolic Panel:  Recent Labs Lab 06/08/14 1252 06/09/14 0532 06/10/14 1100  NA 134* 136* 134*  K 4.2 3.6* 3.9  CL 94* 101 100  CO2 26 23 24   GLUCOSE 242* 123* 166*  BUN 13 9 9   CREATININE 0.75 0.73 0.65  CALCIUM 9.5 8.5 8.8   Liver Function Tests:  Recent Labs Lab 06/08/14 1252 06/08/14 1844  AST 15  --   ALT 15  --   ALKPHOS 116  --   BILITOT 0.5  --   PROT 9.1*  --   ALBUMIN 3.5 2.8*   No results for input(s): LIPASE, AMYLASE in the last 168 hours. No results for input(s): AMMONIA in the last 168 hours. CBC:  Recent Labs Lab 06/08/14 1252 06/09/14 0532 06/10/14 1100  WBC 15.8* 8.7 8.9  NEUTROABS 14.0*  --   --   HGB 13.3 10.6* 11.4*  HCT 40.3 32.3* 33.8*  MCV 90.0 89.7 88.5  PLT 292 211 266   Cardiac Enzymes: No results for input(s): CKTOTAL, CKMB, CKMBINDEX, TROPONINI  in the last 168 hours. BNP: BNP (last 3 results)  Recent Labs  01/11/14 1855  PROBNP 148.5*    CBG:  Recent Labs Lab 06/10/14 2012 06/11/14 0002 06/11/14 0353 06/11/14 0721 06/11/14 1216  GLUCAP 142* 120* 120* 159* 150*       Signed:  Kainen Struckman  [facility] 06/11/2014, 12:51 PM

## 2014-06-11 NOTE — Progress Notes (Signed)
Physical Therapy Treatment Patient Details Name: Ryan [name] MRN: [id number] DOB: 08/21/1960 Today's Date: 06/11/2014    History of Present Illness 53 yo male with complications from DM, with amputation for L great toe on 06/08/14.  PMHx:  R toe amputation, osteomyelitis, possible L subclavian steal syndrome.    PT Comments    Pt completed gait training without the use of an assistive device; PT goals met.  Pt has met max I for this setting.  Will consult LPT for discharge from Acute PT services.   Follow Up Recommendations  No PT follow up     Equipment Recommendations  None recommended by PT    Recommendations for Other Services       Precautions / Restrictions Restrictions Weight Bearing Restrictions: No    Mobility  Bed Mobility               General bed mobility comments: pt was seen sitting on bench in room  Transfers Overall transfer level: Independent Equipment used: None                Ambulation/Gait Ambulation/Gait assistance: Independent Ambulation Distance (Feet): 500 Feet Assistive device: None Gait Pattern/deviations: Step-through pattern Gait velocity: normal for condition   General Gait Details: pt was given STC for gait but he was not using it so he completed gait with an assistive device   Stairs            Wheelchair Mobility    Modified Rankin (Stroke Patients Only)       Balance                                    Cognition Arousal/Alertness: Awake/alert Behavior During Therapy: WFL for tasks assessed/performed Overall Cognitive Status: Within Functional Limits for tasks assessed                      Exercises      General Comments        Pertinent Vitals/Pain Pain Assessment: No/denies pain    Home Living                      Prior Function            PT Goals (current goals can now be found in the care plan section) Progress towards PT goals: Progressing toward  goals    Frequency       PT Plan Other (comment) (will consult LPT; pt has met max level of I for this setting)    Co-evaluation             End of Session Equipment Utilized During Treatment: Gait belt Activity Tolerance: Patient tolerated treatment well Patient left: in chair;with call bell/phone within reach     Time: 1117-1131 PT Time Calculation (min) (ACUTE ONLY): 14 min  Charges:                       G Codes:      Miller,Derrick, SPTA 06/11/2014, 12:48 PM   Reviewed above.sign  Lori Kropski  PTA [facility]  Acute  Rehab Pager      [phone]  Discussed with PTA, pt has met all goals and will DC from Acute PT at this time.  Sharron Britt, PT Pager: [phone] 06/11/2014    

## 2014-06-14 LAB — CULTURE, BLOOD (ROUTINE X 2)
CULTURE: NO GROWTH
Culture: NO GROWTH

## 2014-06-23 ENCOUNTER — Encounter: Payer: Self-pay | Admitting: Internal Medicine

## 2014-06-23 ENCOUNTER — Ambulatory Visit (INDEPENDENT_AMBULATORY_CARE_PROVIDER_SITE_OTHER): Payer: Self-pay | Admitting: Internal Medicine

## 2014-06-23 ENCOUNTER — Ambulatory Visit: Payer: Self-pay | Attending: Internal Medicine | Admitting: Internal Medicine

## 2014-06-23 VITALS — BP 166/85 | HR 76 | Temp 97.5°F | Resp 16 | Ht 71.0 in | Wt 164.0 lb

## 2014-06-23 VITALS — BP 198/102 | HR 89 | Temp 97.7°F | Wt 164.8 lb

## 2014-06-23 DIAGNOSIS — I10 Essential (primary) hypertension: Secondary | ICD-10-CM | POA: Insufficient documentation

## 2014-06-23 DIAGNOSIS — E1169 Type 2 diabetes mellitus with other specified complication: Secondary | ICD-10-CM

## 2014-06-23 DIAGNOSIS — L97509 Non-pressure chronic ulcer of other part of unspecified foot with unspecified severity: Secondary | ICD-10-CM | POA: Insufficient documentation

## 2014-06-23 DIAGNOSIS — I358 Other nonrheumatic aortic valve disorders: Secondary | ICD-10-CM

## 2014-06-23 DIAGNOSIS — R011 Cardiac murmur, unspecified: Secondary | ICD-10-CM | POA: Insufficient documentation

## 2014-06-23 DIAGNOSIS — E119 Type 2 diabetes mellitus without complications: Secondary | ICD-10-CM

## 2014-06-23 DIAGNOSIS — E11628 Type 2 diabetes mellitus with other skin complications: Secondary | ICD-10-CM

## 2014-06-23 DIAGNOSIS — Z89411 Acquired absence of right great toe: Secondary | ICD-10-CM | POA: Insufficient documentation

## 2014-06-23 DIAGNOSIS — E11621 Type 2 diabetes mellitus with foot ulcer: Secondary | ICD-10-CM | POA: Insufficient documentation

## 2014-06-23 DIAGNOSIS — Z794 Long term (current) use of insulin: Secondary | ICD-10-CM | POA: Insufficient documentation

## 2014-06-23 DIAGNOSIS — L089 Local infection of the skin and subcutaneous tissue, unspecified: Secondary | ICD-10-CM

## 2014-06-23 DIAGNOSIS — Z09 Encounter for follow-up examination after completed treatment for conditions other than malignant neoplasm: Secondary | ICD-10-CM | POA: Insufficient documentation

## 2014-06-23 LAB — GLUCOSE, POCT (MANUAL RESULT ENTRY): POC Glucose: 216 mg/dl — AB (ref 70–99)

## 2014-06-23 MED ORDER — LISINOPRIL 20 MG PO TABS: 20.0000 mg | ORAL_TABLET | Freq: Every day | ORAL | Status: DC

## 2014-06-23 NOTE — Progress Notes (Signed)
Patient ID: Ryan PellantRonald R [name], male   DOB: [birth year], 53 y.o.   MRN: [id number]   Ryan [name], is a 53 y.o. male  [id number]  [id number]  DOB - [birth year]  Chief Complaint  Patient presents with  . Follow-up        Subjective:   Ryan [name] is a 53 y.o. male here today for a follow up visit. Patient has made medical history significant for hypertension, diabetes mellitus requiring insulin, diabetic foot ulcer now status post right first toe amputation, done about 2 weeks ago, patient is here today for follow-up. His stitches were removed this morning and the orthopedics office. He has no new complaint. He takes his medications regularly. Patient has appointment with infectious disease department this morning also. Patient has No headache, No chest pain, No abdominal pain - No Nausea, No new weakness tingling or numbness, No Cough - SOB.  Problem  Essential Hypertension    ALLERGIES: Allergies  Allergen Reactions  . Metformin And Related Other (See Comments)    Pt states that metformin made sugars fall into 30's    PAST MEDICAL HISTORY: Past Medical History  Diagnosis Date  . Diabetes mellitus without complication     MEDICATIONS AT HOME: Prior to Admission medications   Medication Sig Start Date End Date Taking? Authorizing Provider  Cyanocobalamin (B-12 PO) Take 1 tablet by mouth daily.   Yes Historical Provider, MD  glucose blood (TRUETEST TEST) test strip Use as instructed 05/07/14  Yes Cythnia Osmun E Hyman HopesJegede, MD  insulin detemir (LEVEMIR) 100 UNIT/ML injection Inject 0.1 mLs (10 Units total) into the skin at bedtime. Patient taking differently: Inject 10 Units into the skin every morning.  01/20/14  Yes Quentin Angstlugbemiga E Nishi Neiswonger, MD  Insulin Syringe-Needle U-100 30G X 5/16" 0.5 ML MISC 1 Syringe by Does not apply route daily. 05/07/14  Yes Quentin Angstlugbemiga E Raini Tiley, MD  lisinopril (PRINIVIL,ZESTRIL) 20 MG tablet Take 1 tablet (20 mg total) by mouth daily. 06/23/14  Yes  Quentin Angstlugbemiga E Tashala Cumbo, MD  TRUEPLUS LANCETS 33G MISC 1 each by Does not apply route 3 (three) times daily before meals. 01/15/14  Yes Costin Otelia SergeantM Gherghe, MD  Amino Acids-Protein Hydrolys (FEEDING SUPPLEMENT, PRO-STAT SUGAR FREE 64,) LIQD Place 30 mLs into feeding tube 2 (two) times daily with a meal. Patient not taking: Reported on 06/23/2014 06/11/14   Kathlen ModyVijaya Akula, MD  feeding supplement, GLUCERNA SHAKE, (GLUCERNA SHAKE) LIQD Take 237 mLs by mouth daily. Patient not taking: Reported on 06/23/2014 06/11/14   Kathlen ModyVijaya Akula, MD  ferrous sulfate 325 (65 FE) MG tablet Take 1 tablet (325 mg total) by mouth 3 (three) times daily after meals. Patient not taking: Reported on 06/23/2014 01/15/14   Leatha Gildingostin M Gherghe, MD  oxyCODONE (OXY IR/ROXICODONE) 5 MG immediate release tablet Take 1 tablet (5 mg total) by mouth every 4 (four) hours as needed for moderate pain. Patient not taking: Reported on 06/23/2014 06/11/14   Kathlen ModyVijaya Akula, MD  UNABLE TO FIND Apply 1 application topically daily. Silver shield gel    Historical Provider, MD     Objective:   Filed Vitals:   06/23/14 1234  BP: 166/85  Pulse: 76  Temp: 97.5 F (36.4 C)  TempSrc: Oral  Resp: 16  Height: 5\' 11"  (1.803 m)  Weight: 164 lb (74.39 kg)  SpO2: 100%    Exam General appearance : Awake, alert, not in any distress. Speech Clear. Not toxic looking HEENT: Atraumatic and Normocephalic, pupils equally reactive to light and accomodation Neck: supple, no  JVD. No cervical lymphadenopathy.  Chest:Good air entry bilaterally, no added sounds  CVS: S1 S2 regular, +murmurs.  Abdomen: Bowel sounds present, Non tender and not distended with no gaurding, rigidity or rebound. Extremities: B/L Lower Ext shows no edema, both legs are warm to touch Neurology: Awake alert, and oriented X 3, CN II-XII intact, Non focal Skin:No Rash Wounds:N/A  Data Review Lab Results  Component Value Date   HGBA1C 7.0* 06/08/2014   HGBA1C 6.9 05/07/2014   HGBA1C 7.6*  01/12/2014     Assessment & Plan   1. Type 2 diabetes mellitus without complication  - Glucose (CBG)  Aim for 2-3 Carb Choices per meal (30-45 grams) +/- 1 either way  Aim for 0-15 Carbs per snack if hungry  Include protein in moderation with your meals and snacks  Consider reading food labels for Total Carbohydrate and Fat Grams of foods  Consider checking BG at alternate times per day  Continue taking medication as directed Fruit Punch - find one with no sugar  Measure and decrease portions of carbohydrate foods  Make your plate and don't go back for seconds   2. Essential hypertension  - lisinopril (PRINIVIL,ZESTRIL) 20 MG tablet; Take 1 tablet (20 mg total) by mouth daily.  Dispense: 90 tablet; Refill: 3  - We have discussed target BP range and blood pressure goal - I have advised patient to check BP regularly and to call us back or report to clinic if the numbers are consistently higher than 140/90  - We discussed the importance of compliance with medical therapy and DASH diet recommended, consequences of uncontrolled hypertension discussed.  - continue current BP medications  3. Status post right first toe amputation No evidence of infection Stitches removed Continue current medication Follow-up with orthopedic surgery and infectious disease.  Return in about 3 months (around 09/23/2014) for Hemoglobin A1C and Follow up, DM, Follow up HTN.  The patient was given clear instructions to go to ER or return to medical center if symptoms don't improve, worsen or new problems develop. The patient verbalized understanding. The patient was told to call to get lab results if they haven't heard anything in the next week.   This note has been created with Education officer, environmentalDragon speech recognition software and smart phrase technology. Any transcriptional errors are unintentional.    Jeanann LewandowskyJEGEDE, Orit [place], MD, MHA, FACP, FAAP [facility] [place],  [place] [phone]   06/23/2014, 1:00 PM

## 2014-06-23 NOTE — Progress Notes (Signed)
Pt is here today to check on his left foot after having his toe amputated. Pt had the stiches removed this morning.

## 2014-06-23 NOTE — Patient Instructions (Signed)
Dash diet DASH Eating Plan DASH stands for "Dietary Approaches to Stop Hypertension." The DASH eating plan is a healthy eating plan that has been shown to reduce high blood pressure (hypertension). Additional health benefits may include reducing the risk of type 2 diabetes mellitus, heart disease, and stroke. The DASH eating plan may also help with weight loss. WHAT DO I NEED TO KNOW ABOUT THE DASH EATING PLAN? For the DASH eating plan, you will follow these general guidelines:  Choose foods with a percent daily value for sodium of less than 5% (as listed on the food label).  Use salt-free seasonings or herbs instead of table salt or sea salt.  Check with your health care provider or pharmacist before using salt substitutes.  Eat lower-sodium products, often labeled as "lower sodium" or "no salt added."  Eat fresh foods.  Eat more vegetables, fruits, and low-fat dairy products.  Choose whole grains. Look for the word "whole" as the first word in the ingredient list.  Choose fish and skinless chicken or turkey more often than red meat. Limit fish, poultry, and meat to 6 oz (170 g) each day.  Limit sweets, desserts, sugars, and sugary drinks.  Choose heart-healthy fats.  Limit cheese to 1 oz (28 g) per day.  Eat more home-cooked food and less restaurant, buffet, and fast food.  Limit fried foods.  Cook foods using methods other than frying.  Limit canned vegetables. If you do use them, rinse them well to decrease the sodium.  When eating at a restaurant, ask that your food be prepared with less salt, or no salt if possible. WHAT FOODS CAN I EAT? Seek help from a dietitian for individual calorie needs. Grains Whole grain or whole wheat bread. Brown rice. Whole grain or whole wheat pasta. Quinoa, bulgur, and whole grain cereals. Low-sodium cereals. Corn or whole wheat flour tortillas. Whole grain cornbread. Whole grain crackers. Low-sodium crackers. Vegetables Fresh or frozen  vegetables (raw, steamed, roasted, or grilled). Low-sodium or reduced-sodium tomato and vegetable juices. Low-sodium or reduced-sodium tomato sauce and paste. Low-sodium or reduced-sodium canned vegetables.  Fruits All fresh, canned (in natural juice), or frozen fruits. Meat and Other Protein Products Ground beef (85% or leaner), grass-fed beef, or beef trimmed of fat. Skinless chicken or turkey. Ground chicken or turkey. Pork trimmed of fat. All fish and seafood. Eggs. Dried beans, peas, or lentils. Unsalted nuts and seeds. Unsalted canned beans. Dairy Low-fat dairy products, such as skim or 1% milk, 2% or reduced-fat cheeses, low-fat ricotta or cottage cheese, or plain low-fat yogurt. Low-sodium or reduced-sodium cheeses. Fats and Oils Tub margarines without trans fats. Light or reduced-fat mayonnaise and salad dressings (reduced sodium). Avocado. Safflower, olive, or canola oils. Natural peanut or almond butter. Other Unsalted popcorn and pretzels. The items listed above may not be a complete list of recommended foods or beverages. Contact your dietitian for more options. WHAT FOODS ARE NOT RECOMMENDED? Grains White bread. White pasta. White rice. Refined cornbread. Bagels and croissants. Crackers that contain trans fat. Vegetables Creamed or fried vegetables. Vegetables in a cheese sauce. Regular canned vegetables. Regular canned tomato sauce and paste. Regular tomato and vegetable juices. Fruits Dried fruits. Canned fruit in light or heavy syrup. Fruit juice. Meat and Other Protein Products Fatty cuts of meat. Ribs, chicken wings, bacon, sausage, bologna, salami, chitterlings, fatback, hot dogs, bratwurst, and packaged luncheon meats. Salted nuts and seeds. Canned beans with salt. Dairy Whole or 2% milk, cream, half-and-half, and cream cheese. Whole-fat or sweetened   yogurt. Full-fat cheeses or blue cheese. Nondairy creamers and whipped toppings. Processed cheese, cheese spreads, or cheese  curds. Condiments Onion and garlic salt, seasoned salt, table salt, and sea salt. Canned and packaged gravies. Worcestershire sauce. Tartar sauce. Barbecue sauce. Teriyaki sauce. Soy sauce, including reduced sodium. Steak sauce. Fish sauce. Oyster sauce. Cocktail sauce. Horseradish. Ketchup and mustard. Meat flavorings and tenderizers. Bouillon cubes. Hot sauce. Tabasco sauce. Marinades. Taco seasonings. Relishes. Fats and Oils Butter, stick margarine, lard, shortening, ghee, and bacon fat. Coconut, palm kernel, or palm oils. Regular salad dressings. Other Pickles and olives. Salted popcorn and pretzels. The items listed above may not be a complete list of foods and beverages to avoid. Contact your dietitian for more information. WHERE CAN I FIND MORE INFORMATION? National Heart, Lung, and Blood Institute: travelstabloid.com Document Released: 07/06/2011 Document Revised: 12/01/2013 Document Reviewed: 05/21/2013 [facility] Patient Information 2015 [place], [place]. This information is not intended to replace advice given to you by your health care provider. Make sure you discuss any questions you have with your health care provider. Hypertension Hypertension, commonly called high blood pressure, is when the force of blood pumping through your arteries is too strong. Your arteries are the blood vessels that carry blood from your heart throughout your body. A blood pressure reading consists of a higher number over a lower number, such as 110/72. The higher number (systolic) is the pressure inside your arteries when your heart pumps. The lower number (diastolic) is the pressure inside your arteries when your heart relaxes. Ideally you want your blood pressure below 120/80. Hypertension forces your heart to work harder to pump blood. Your arteries may become narrow or stiff. Having hypertension puts you at risk for heart disease, stroke, and other problems.  RISK  FACTORS Some risk factors for high blood pressure are controllable. Others are not.  Risk factors you cannot control include:   Race. You may be at higher risk if you are African American.  Age. Risk increases with age.  Gender. Men are at higher risk than women before age 73 years. After age 57, women are at higher risk than men. Risk factors you can control include:  Not getting enough exercise or physical activity.  Being overweight.  Getting too much fat, sugar, calories, or salt in your diet.  Drinking too much alcohol. SIGNS AND SYMPTOMS Hypertension does not usually cause signs or symptoms. Extremely high blood pressure (hypertensive crisis) may cause headache, anxiety, shortness of breath, and nosebleed. DIAGNOSIS  To check if you have hypertension, your health care provider will measure your blood pressure while you are seated, with your arm held at the level of your heart. It should be measured at least twice using the same arm. Certain conditions can cause a difference in blood pressure between your right and left arms. A blood pressure reading that is higher than normal on one occasion does not mean that you need treatment. If one blood pressure reading is high, ask your health care provider about having it checked again. TREATMENT  Treating high blood pressure includes making lifestyle changes and possibly taking medicine. Living a healthy lifestyle can help lower high blood pressure. You may need to change some of your habits. Lifestyle changes may include:  Following the DASH diet. This diet is high in fruits, vegetables, and whole grains. It is low in salt, red meat, and added sugars.  Getting at least 2 hours of brisk physical activity every week.  Losing weight if necessary.  Not smoking.  Limiting alcoholic beverages.  Learning ways to reduce stress. If lifestyle changes are not enough to get your blood pressure under control, your health care provider may  prescribe medicine. You may need to take more than one. Work closely with your health care provider to understand the risks and benefits. HOME CARE INSTRUCTIONS  Have your blood pressure rechecked as directed by your health care provider.   Take medicines only as directed by your health care provider. Follow the directions carefully. Blood pressure medicines must be taken as prescribed. The medicine does not work as well when you skip doses. Skipping doses also puts you at risk for problems.   Do not smoke.   Monitor your blood pressure at home as directed by your health care provider. SEEK MEDICAL CARE IF:   You think you are having a reaction to medicines taken.  You have recurrent headaches or feel dizzy.  You have swelling in your ankles.  You have trouble with your vision. SEEK IMMEDIATE MEDICAL CARE IF:  You develop a severe headache or confusion.  You have unusual weakness, numbness, or feel faint.  You have severe chest or abdominal pain.  You vomit repeatedly.  You have trouble breathing. MAKE SURE YOU:   Understand these instructions.  Will watch your condition.  Will get help right away if you are not doing well or get worse. Document Released: 07/17/2005 Document Revised: 12/01/2013 Document Reviewed: 05/09/2013 [facility] Patient Information 2015 [place]. This information is not intended to replace advice given to you by your health care provider. Make sure you discuss any questions you have with your health care provider. Basic Carbohydrate Counting for Diabetes Mellitus Carbohydrate counting is a method for keeping track of the amount of carbohydrates you eat. Eating carbohydrates naturally increases the level of sugar (glucose) in your blood, so it is important for you to know the amount that is okay for you to have in every meal. Carbohydrate counting helps keep the level of glucose in your blood within normal limits. The amount of carbohydrates  allowed is different for every person. A dietitian can help you calculate the amount that is right for you. Once you know the amount of carbohydrates you can have, you can count the carbohydrates in the foods you want to eat. Carbohydrates are found in the following foods:  Grains, such as breads and cereals.  Dried beans and soy products.  Starchy vegetables, such as potatoes, peas, and corn.  Fruit and fruit juices.  Milk and yogurt.  Sweets and snack foods, such as cake, cookies, candy, chips, soft drinks, and fruit drinks. CARBOHYDRATE COUNTING There are two ways to count the carbohydrates in your food. You can use either of the methods or a combination of both. Reading the "Nutrition Facts" on Packaged Food The "Nutrition Facts" is an area that is included on the labels of almost all packaged food and beverages in the [place]. It includes the serving size of that food or beverage and information about the nutrients in each serving of the food, including the grams (g) of carbohydrate per serving.  Decide the number of servings of this food or beverage that you will be able to eat or drink. Multiply that number of servings by the number of grams of carbohydrate that is listed on the label for that serving. The total will be the amount of carbohydrates you will be having when you eat or drink this food or beverage. [employer] Standard Serving Sizes of Food When  you eat food that is not packaged or does not include "Nutrition Facts" on the label, you need to measure the servings in order to count the amount of carbohydrates.A serving of most carbohydrate-rich foods contains about 15 g of carbohydrates. The following list includes serving sizes of carbohydrate-rich foods that provide 15 g ofcarbohydrate per serving:   1 slice of bread (1 oz) or 1 six-inch tortilla.    of a hamburger bun or English muffin.  4-6 crackers.   cup unsweetened dry cereal.    cup hot cereal.    cup rice or pasta.    cup mashed potatoes or  of a large baked potato.  1 cup fresh fruit or one small piece of fruit.    cup canned or frozen fruit or fruit juice.  1 cup milk.   cup plain fat-free yogurt or yogurt sweetened with artificial sweeteners.   cup cooked dried beans or starchy vegetable, such as peas, corn, or potatoes.  Decide the number of standard-size servings that you will eat. Multiply that number of servings by 15 (the grams of carbohydrates in that serving). For example, if you eat 2 cups of strawberries, you will have eaten 2 servings and 30 g of carbohydrates (2 servings x 15 g = 30 g). For foods such as soups and casseroles, in which more than one food is mixed in, you will need to count the carbohydrates in each food that is included. EXAMPLE OF CARBOHYDRATE COUNTING Sample Dinner  3 oz chicken breast.   cup of brown rice.   cup of corn.  1 cup milk.   1 cup strawberries with sugar-free whipped topping.  Carbohydrate Calculation Step 1: Identify the foods that contain carbohydrates:   Rice.   Corn.   Milk.   Strawberries. Step 2:Calculate the number of servings eaten of each:   2 servings of rice.   1 serving of corn.   1 serving of milk.   1 serving of strawberries. Step 3: Multiply each of those number of servings by 15 g:   2 servings of rice x 15 g = 30 g.   1 serving of corn x 15 g = 15 g.   1 serving of milk x 15 g = 15 g.   1 serving of strawberries x 15 g = 15 g. Step 4: Add together all of the amounts to find the total grams of carbohydrates eaten: 30 g + 15 g + 15 g + 15 g = 75 g. Document Released: 07/17/2005 Document Revised: 12/01/2013 Document Reviewed: 06/13/2013 [facility] Patient Information 2015 [place], [place]. This information is not intended to replace advice given to you by your health care provider. Make sure you discuss any questions you have with your health care provider. Diabetes Mellitus  and Food It is important for you to manage your blood sugar (glucose) level. Your blood glucose level can be greatly affected by what you eat. Eating healthier foods in the appropriate amounts throughout the day at about the same time each day will help you control your blood glucose level. It can also help slow or prevent worsening of your diabetes mellitus. Healthy eating may even help you improve the level of your blood pressure and reach or maintain a healthy weight.  HOW CAN FOOD AFFECT ME? Carbohydrates Carbohydrates affect your blood glucose level more than any other type of food. Your dietitian will help you determine how many carbohydrates to eat at each meal and teach you how to count  carbohydrates. Counting carbohydrates is important to keep your blood glucose at a healthy level, especially if you are using insulin or taking certain medicines for diabetes mellitus. Alcohol Alcohol can cause sudden decreases in blood glucose (hypoglycemia), especially if you use insulin or take certain medicines for diabetes mellitus. Hypoglycemia can be a life-threatening condition. Symptoms of hypoglycemia (sleepiness, dizziness, and disorientation) are similar to symptoms of having too much alcohol.  If your health care provider has given you approval to drink alcohol, do so in moderation and use the following guidelines:  Women should not have more than one drink per day, and men should not have more than two drinks per day. One drink is equal to:  12 oz of beer.  5 oz of wine.  1 oz of hard liquor.  Do not drink on an empty stomach.  Keep yourself hydrated. Have water, diet soda, or unsweetened iced tea.  Regular soda, juice, and other mixers might contain a lot of carbohydrates and should be counted. WHAT FOODS ARE NOT RECOMMENDED? As you make food choices, it is important to remember that all foods are not the same. Some foods have fewer nutrients per serving than other foods, even though  they might have the same number of calories or carbohydrates. It is difficult to get your body what it needs when you eat foods with fewer nutrients. Examples of foods that you should avoid that are high in calories and carbohydrates but low in nutrients include:  Trans fats (most processed foods list trans fats on the Nutrition Facts label).  Regular soda.  Juice.  Candy.  Sweets, such as cake, pie, doughnuts, and cookies.  Fried foods. WHAT FOODS CAN I EAT? Have nutrient-rich foods, which will nourish your body and keep you healthy. The food you should eat also will depend on several factors, including:  The calories you need.  The medicines you take.  Your weight.  Your blood glucose level.  Your blood pressure level.  Your cholesterol level. You also should eat a variety of foods, including:  Protein, such as meat, poultry, fish, tofu, nuts, and seeds (lean animal proteins are best).  Fruits.  Vegetables.  Dairy products, such as milk, cheese, and yogurt (low fat is best).  Breads, grains, pasta, cereal, rice, and beans.  Fats such as olive oil, trans fat-free margarine, canola oil, avocado, and olives. DOES EVERYONE WITH DIABETES MELLITUS HAVE THE SAME MEAL PLAN? Because every person with diabetes mellitus is different, there is not one meal plan that works for everyone. It is very important that you meet with a dietitian who will help you create a meal plan that is just right for you. Document Released: 04/13/2005 Document Revised: 07/22/2013 Document Reviewed: 06/13/2013 [facility] Patient Information 2015 [place], [place]. This information is not intended to replace advice given to you by your health care provider. Make sure you discuss any questions you have with your health care provider.

## 2014-06-23 NOTE — Progress Notes (Signed)
Patient ID: Ryan [name], male   DOB: January 24, 1961, 53 y.o.   MRN: [id number]         [facility]  Patient Active Problem List   Diagnosis Date Noted  . SIRS (systemic inflammatory response syndrome) 06/08/2014  . Osteomyelitis of left foot 06/08/2014  . Osteomyelitis of toe 06/08/2014  . Diabetes mellitus type 2 with complications   . HTN (hypertension) 05/07/2014  . Diabetic foot 05/07/2014  . Systolic murmur of aorta 02/03/2014  . Gram-positive bacteremia 01/13/2014  . Osteomyelitis of right foot 01/13/2014  . Osteomyelitis of foot, right, acute 01/13/2014  . Hyponatremia 01/12/2014  . Leukocytosis 01/12/2014  . Diabetes mellitus 01/11/2014  . 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 Osteomyelitis 01/11/2014    Patient's Medications  New Prescriptions   No medications on file  Previous Medications   AMINO ACIDS-PROTEIN HYDROLYS (FEEDING SUPPLEMENT, PRO-STAT SUGAR FREE 64,) LIQD    Place 30 mLs into feeding tube 2 (two) times daily with a meal.   CYANOCOBALAMIN (B-12 PO)    Take 1 tablet by mouth daily.   FEEDING SUPPLEMENT, GLUCERNA SHAKE, (GLUCERNA SHAKE) LIQD    Take 237 mLs by mouth daily.   FERROUS SULFATE 325 (65 FE) MG TABLET    Take 1 tablet (325 mg total) by mouth 3 (three) times daily after meals.   GLUCOSE BLOOD (TRUETEST TEST) TEST STRIP    Use as instructed   INSULIN DETEMIR (LEVEMIR) 100 UNIT/ML INJECTION    Inject 0.1 mLs (10 Units total) into the skin at bedtime.   INSULIN SYRINGE-NEEDLE U-100 30G X 5/16" 0.5 ML MISC    1 Syringe by Does not apply route daily.   LISINOPRIL (PRINIVIL,ZESTRIL) 20 MG TABLET    Take 1 tablet (20 mg total) by mouth daily.   OXYCODONE (OXY IR/ROXICODONE) 5 MG IMMEDIATE RELEASE TABLET    Take 1 tablet (5 mg total) by mouth every 4 (four) hours as needed for moderate pain.   TRUEPLUS LANCETS 33G MISC    1 each by Does not apply route 3 (three) times daily before meals.   UNABLE TO FIND     Apply 1 application topically daily. Silver shield gel  Modified Medications   No medications on file  Discontinued Medications   AMOXICILLIN-CLAVULANATE (AUGMENTIN) 875-125 MG PER TABLET    Take 1 tablet by mouth 2 (two) times daily.   DOXYCYCLINE (VIBRA-TABS) 100 MG TABLET    Take 1 tablet (100 mg total) by mouth 2 (two) times daily.    Subjective: Ryan [name] is in for his follow-up visit. After his last visit here he was rehospitalized from November 9-12 with acute osteomyelitis and abscess of his left great toe. He underwent amputation. No specimens were sent for Gram stain or culture. He was discharged on oral doxycycline and amoxicillin clavulanate for 10 days. He completed antibiotic therapy 3 days ago. He has had no fever, chills or sweats.  He recently was evicted from his apartment and is currently homeless. He has been sleeping in his car the past 3 nights. He is hoping that he will be able to rent a trailer and move in later today. Since he was evicted he has not been able to use his glucose meter.  Review of Systems: Pertinent items are noted in HPI.  Past Medical History  Diagnosis Date  . Diabetes mellitus without complication     History  Substance Use Topics  . Smoking status: Never Smoker   .  Smokeless tobacco: Current User    Types: Snuff  . Alcohol Use: No     Comment: occasionally    No family history on file.  Allergies  Allergen Reactions  . Metformin And Related Other (See Comments)    Pt states that metformin made sugars fall into 30's    Objective: Temp: 97.7 F (36.5 C) (11/24 0839) Temp Source: Oral (11/24 0839) BP: 198/102 mmHg (11/24 0844) Pulse Rate: 89 (11/24 0844)  General: He is talkative and in good spirits Feet: Both great toes are surgically absent. Sutures remain in his left foot incision. There is no evidence of any active infection in either foot.  Assessment: Hopefully his osteomyelitis in both great toes have now been fully  cured with bilateral amputations.  Plan: 1. Continue observation off of antibiotics 2. He has follow-up visits with his primary care physician and his orthopedic surgeon today   Cliffton AstersJohn Clayton Jarmon, MD [facility] [phone] pager   [phone] 06/23/2014, 8:51 AM

## 2014-06-30 ENCOUNTER — Encounter (HOSPITAL_COMMUNITY): Payer: Self-pay | Admitting: *Deleted

## 2014-06-30 MED ORDER — CEFAZOLIN SODIUM-DEXTROSE 2-3 GM-% IV SOLR
2.0000 g | INTRAVENOUS | Status: AC
  Administered 2014-07-01: 2 g via INTRAVENOUS
  Filled 2014-06-30: qty 50

## 2014-06-30 NOTE — H&P (Signed)
Ryan PellantRonald R [name] is an 53 y.o. male.    Chief Complaint: left foot pain  HPI: Pt is a 53 y.o. male complaining of left foot pain. Pain had continually increased since the beginning. X-rays show osteomyelitis to left great toe and pt is s/p great toe amputation. Pt has tried various conservative treatments which have failed to alleviate their symptoms. Various options are discussed with the patient. Risks, benefits and expectations were discussed with the patient. Patient understand the risks, benefits and expectations and wishes to proceed with surgery.   PCP:  Jeanann LewandowskyJEGEDE, OLUGBEMIGA, MD  D/C Plans:  Home   PMH: Past Medical History  Diagnosis Date  . Hypertension   . Osteomyelitis of foot   . Heart murmur     was told "not to worry about it"  . Peripheral vascular disease     "poor circulation" in feet  . Diabetes mellitus without complication     type 2  . Diabetic neuropathy   . Arthritis   . Anemia     low iron    PSH: Past Surgical History  Procedure Laterality Date  . Amputation Right 01/13/2014    Procedure: Right great toe amputation;  Surgeon: Jacki Conesonald A Gioffre, MD;  Location: [facility];  Service: Orthopedics;  Laterality: Right;  . Knee surgery Right   . Arm surgery Left     due to broken arm  . Amputation Left 06/08/2014    Procedure: AMPUTATION LEFT GREAT TOE;  Surgeon: Verlee RossettiSteven R Norris, MD;  Location: [facility];  Service: Orthopedics;  Laterality: Left;    Social History:  reports that he has never smoked. His smokeless tobacco use includes Snuff. He reports that he drinks alcohol. He reports that he does not use illicit drugs.  Allergies:  Allergies  Allergen Reactions  . Metformin And Related Other (See Comments)    Pt states that metformin made sugars fall into 30's    Medications: No current facility-administered medications for this encounter.   Current Outpatient Prescriptions  Medication Sig Dispense Refill  . naproxen sodium (ANAPROX) 220 MG tablet Take  220 mg by mouth 2 (two) times daily as needed.    . Amino Acids-Protein Hydrolys (FEEDING SUPPLEMENT, PRO-STAT SUGAR FREE 64,) LIQD Place 30 mLs into feeding tube 2 (two) times daily with a meal. (Patient not taking: Reported on 06/23/2014) 900 mL 0  . Cyanocobalamin (B-12 PO) Take 1 tablet by mouth daily.    . feeding supplement, GLUCERNA SHAKE, (GLUCERNA SHAKE) LIQD Take 237 mLs by mouth daily. (Patient not taking: Reported on 06/23/2014)  0  . ferrous sulfate 325 (65 FE) MG tablet Take 1 tablet (325 mg total) by mouth 3 (three) times daily after meals. (Patient not taking: Reported on 06/23/2014) 90 tablet 1  . glucose blood (TRUETEST TEST) test strip Use as instructed 100 each 12  . insulin detemir (LEVEMIR) 100 UNIT/ML injection Inject 0.1 mLs (10 Units total) into the skin at bedtime. (Patient taking differently: Inject 10 Units into the skin every morning. ) 4 vial 3  . Insulin Syringe-Needle U-100 30G X 5/16" 0.5 ML MISC 1 Syringe by Does not apply route daily. 100 each 12  . lisinopril (PRINIVIL,ZESTRIL) 20 MG tablet Take 1 tablet (20 mg total) by mouth daily. 90 tablet 3  . oxyCODONE (OXY IR/ROXICODONE) 5 MG immediate release tablet Take 1 tablet (5 mg total) by mouth every 4 (four) hours as needed for moderate pain. (Patient not taking: Reported on 06/23/2014) 10 tablet 0  .  TRUEPLUS LANCETS 33G MISC 1 each by Does not apply route 3 (three) times daily before meals. 100 each 1  . UNABLE TO FIND Apply 1 application topically daily. Silver shield gel      No results found for this or any previous visit (from the past 48 hour(s)). No results found.  ROS: Pain with rom of the left lower extremity  Physical Exam:  Alert and oriented 53 y.o. male in no acute distress Cranial ner16ves 2-12 intact Cervical spine: full rom with no tenderness, nv intact distally Chest: active breath sounds bilaterally, no wheeze rhonchi or rales Heart: regular rate and rhythm, no murmur Abd: non tender non  distended with active bowel sounds Hip is stable with rom  Left foot: no erythema or edema but exposed 1st metatarsal head s/p great toe amputation  nv intact distally to other toes  Assessment/Plan Assessment: left foot/great toe osteomyelitis s/p great toe amputation  Plan: Patient will undergo a left foot ray amputation by [facility]. Risks benefits and expectations were discussed with the patient. Patient understand risks, benefits and expectations and wishes to proceed.

## 2014-06-30 NOTE — Progress Notes (Signed)
No pre-op orders in EPIC. Called Dr. Ranell PatrickNorris' office to request pre-op orders, left message for orders to be put in EPIC.

## 2014-07-01 ENCOUNTER — Ambulatory Visit (HOSPITAL_COMMUNITY): Payer: Self-pay | Admitting: Anesthesiology

## 2014-07-01 ENCOUNTER — Encounter (HOSPITAL_COMMUNITY): Payer: Self-pay | Admitting: *Deleted

## 2014-07-01 ENCOUNTER — Observation Stay (HOSPITAL_COMMUNITY)
Admission: RE | Admit: 2014-07-01 | Discharge: 2014-07-02 | Disposition: A | Discharge status: Home / Self Care | Payer: Self-pay | Source: Ambulatory Visit | Attending: Orthopedic Surgery | Admitting: Orthopedic Surgery

## 2014-07-01 ENCOUNTER — Encounter (HOSPITAL_COMMUNITY)
Admission: RE | Disposition: A | Discharge status: Home / Self Care | Payer: Self-pay | Source: Ambulatory Visit | Attending: Orthopedic Surgery

## 2014-07-01 DIAGNOSIS — Y835 Amputation of limb(s) as the cause of abnormal reaction of the patient, or of later complication, without mention of misadventure at the time of the procedure: Secondary | ICD-10-CM | POA: Insufficient documentation

## 2014-07-01 DIAGNOSIS — R011 Cardiac murmur, unspecified: Secondary | ICD-10-CM | POA: Insufficient documentation

## 2014-07-01 DIAGNOSIS — T8132XA Disruption of internal operation (surgical) wound, not elsewhere classified, initial encounter: Principal | ICD-10-CM | POA: Insufficient documentation

## 2014-07-01 DIAGNOSIS — I739 Peripheral vascular disease, unspecified: Secondary | ICD-10-CM | POA: Insufficient documentation

## 2014-07-01 DIAGNOSIS — Z89412 Acquired absence of left great toe: Secondary | ICD-10-CM | POA: Insufficient documentation

## 2014-07-01 DIAGNOSIS — M868X7 Other osteomyelitis, ankle and foot: Secondary | ICD-10-CM | POA: Insufficient documentation

## 2014-07-01 DIAGNOSIS — E1140 Type 2 diabetes mellitus with diabetic neuropathy, unspecified: Secondary | ICD-10-CM | POA: Insufficient documentation

## 2014-07-01 DIAGNOSIS — Z888 Allergy status to other drugs, medicaments and biological substances status: Secondary | ICD-10-CM | POA: Insufficient documentation

## 2014-07-01 DIAGNOSIS — IMO0002 Reserved for concepts with insufficient information to code with codable children: Secondary | ICD-10-CM

## 2014-07-01 DIAGNOSIS — I10 Essential (primary) hypertension: Secondary | ICD-10-CM | POA: Insufficient documentation

## 2014-07-01 DIAGNOSIS — E1152 Type 2 diabetes mellitus with diabetic peripheral angiopathy with gangrene: Secondary | ICD-10-CM | POA: Insufficient documentation

## 2014-07-01 DIAGNOSIS — M1990 Unspecified osteoarthritis, unspecified site: Secondary | ICD-10-CM | POA: Insufficient documentation

## 2014-07-01 HISTORY — DX: Osteomyelitis, unspecified: M86.9

## 2014-07-01 HISTORY — DX: Anemia, unspecified: D64.9

## 2014-07-01 HISTORY — DX: Cardiac murmur, unspecified: R01.1

## 2014-07-01 HISTORY — PX: AMPUTATION: SHX166

## 2014-07-01 HISTORY — DX: Essential (primary) hypertension: I10

## 2014-07-01 HISTORY — DX: Peripheral vascular disease, unspecified: I73.9

## 2014-07-01 HISTORY — DX: Unspecified osteoarthritis, unspecified site: M19.90

## 2014-07-01 HISTORY — DX: Type 2 diabetes mellitus with diabetic neuropathy, unspecified: E11.40

## 2014-07-01 LAB — CBC
HEMATOCRIT: 36.5 % — AB (ref 39.0–52.0)
Hemoglobin: 12.1 g/dL — ABNORMAL LOW (ref 13.0–17.0)
MCH: 30.4 pg (ref 26.0–34.0)
MCHC: 33.2 g/dL (ref 30.0–36.0)
MCV: 91.7 fL (ref 78.0–100.0)
PLATELETS: 243 10*3/uL (ref 150–400)
RBC: 3.98 MIL/uL — ABNORMAL LOW (ref 4.22–5.81)
RDW: 13.7 % (ref 11.5–15.5)
WBC: 9.3 10*3/uL (ref 4.0–10.5)

## 2014-07-01 LAB — BASIC METABOLIC PANEL
Anion gap: 13 (ref 5–15)
BUN: 11 mg/dL (ref 6–23)
CO2: 24 meq/L (ref 19–32)
CREATININE: 0.59 mg/dL (ref 0.50–1.35)
Calcium: 8.8 mg/dL (ref 8.4–10.5)
Chloride: 100 mEq/L (ref 96–112)
Glucose, Bld: 155 mg/dL — ABNORMAL HIGH (ref 70–99)
Potassium: 4 mEq/L (ref 3.7–5.3)
SODIUM: 137 meq/L (ref 137–147)

## 2014-07-01 LAB — GLUCOSE, CAPILLARY
GLUCOSE-CAPILLARY: 151 mg/dL — AB (ref 70–99)
Glucose-Capillary: 125 mg/dL — ABNORMAL HIGH (ref 70–99)
Glucose-Capillary: 103 mg/dL — ABNORMAL HIGH (ref 70–99)

## 2014-07-01 SURGERY — AMPUTATION, FOOT, RAY
Anesthesia: General | Site: Toe | Laterality: Left

## 2014-07-01 MED ORDER — FENTANYL CITRATE 0.05 MG/ML IJ SOLN
INTRAMUSCULAR | Status: DC | PRN
  Administered 2014-07-01 (×3): 50 ug via INTRAVENOUS

## 2014-07-01 MED ORDER — INSULIN ASPART 100 UNIT/ML ~~LOC~~ SOLN
0.0000 [IU] | Freq: Three times a day (TID) | SUBCUTANEOUS | Status: DC
Start: 2014-07-02 — End: 2014-07-02

## 2014-07-01 MED ORDER — LIDOCAINE HCL (CARDIAC) 20 MG/ML IV SOLN
INTRAVENOUS | Status: DC | PRN
  Administered 2014-07-01: 60 mg via INTRAVENOUS

## 2014-07-01 MED ORDER — ONDANSETRON HCL 4 MG/2ML IJ SOLN: 4.0000 mg | Freq: Four times a day (QID) | INTRAMUSCULAR | Status: DC | PRN

## 2014-07-01 MED ORDER — ONDANSETRON HCL 4 MG/2ML IJ SOLN
INTRAMUSCULAR | Status: AC
  Filled 2014-07-01: qty 2

## 2014-07-01 MED ORDER — GLYCOPYRROLATE 0.2 MG/ML IJ SOLN
INTRAMUSCULAR | Status: AC
  Filled 2014-07-01: qty 3

## 2014-07-01 MED ORDER — METOCLOPRAMIDE HCL 5 MG PO TABS: 5.0000 mg | ORAL_TABLET | Freq: Three times a day (TID) | ORAL | Status: DC | PRN

## 2014-07-01 MED ORDER — CEFAZOLIN SODIUM-DEXTROSE 2-3 GM-% IV SOLR
2.0000 g | Freq: Four times a day (QID) | INTRAVENOUS | Status: DC
  Administered 2014-07-02 (×2): 2 g via INTRAVENOUS
  Filled 2014-07-01 (×3): qty 50

## 2014-07-01 MED ORDER — MIDAZOLAM HCL 5 MG/5ML IJ SOLN
INTRAMUSCULAR | Status: DC | PRN
  Administered 2014-07-01 (×2): 1 mg via INTRAVENOUS

## 2014-07-01 MED ORDER — INSULIN ASPART 100 UNIT/ML ~~LOC~~ SOLN
4.0000 [IU] | Freq: Three times a day (TID) | SUBCUTANEOUS | Status: DC
  Administered 2014-07-02: 4 [IU] via SUBCUTANEOUS

## 2014-07-01 MED ORDER — PHENYLEPHRINE 40 MCG/ML (10ML) SYRINGE FOR IV PUSH (FOR BLOOD PRESSURE SUPPORT)
PREFILLED_SYRINGE | INTRAVENOUS | Status: AC
  Filled 2014-07-01: qty 10

## 2014-07-01 MED ORDER — CHLORHEXIDINE GLUCONATE 4 % EX LIQD
60.0000 mL | Freq: Once | CUTANEOUS | Status: DC
  Filled 2014-07-01: qty 60

## 2014-07-01 MED ORDER — FENTANYL CITRATE 0.05 MG/ML IJ SOLN
INTRAMUSCULAR | Status: AC
  Filled 2014-07-01: qty 5

## 2014-07-01 MED ORDER — PRO-STAT SUGAR FREE PO LIQD
30.0000 mL | Freq: Two times a day (BID) | ORAL | Status: DC
Start: 2014-07-02 — End: 2014-07-02
  Administered 2014-07-02: 30 mL
  Filled 2014-07-01 (×3): qty 30

## 2014-07-01 MED ORDER — NEOSTIGMINE METHYLSULFATE 10 MG/10ML IV SOLN
INTRAVENOUS | Status: AC
  Filled 2014-07-01: qty 1

## 2014-07-01 MED ORDER — PROPOFOL 10 MG/ML IV BOLUS
INTRAVENOUS | Status: AC
  Filled 2014-07-01: qty 20

## 2014-07-01 MED ORDER — SODIUM CHLORIDE 0.9 % IV SOLN
INTRAVENOUS | Status: DC
Start: 2014-07-01 — End: 2014-07-02
  Administered 2014-07-01: 20:00:00 via INTRAVENOUS

## 2014-07-01 MED ORDER — ROCURONIUM BROMIDE 50 MG/5ML IV SOLN
INTRAVENOUS | Status: AC
  Filled 2014-07-01: qty 1

## 2014-07-01 MED ORDER — LISINOPRIL 20 MG PO TABS
20.0000 mg | ORAL_TABLET | Freq: Every day | ORAL | Status: DC
  Administered 2014-07-01 – 2014-07-02 (×2): 20 mg via ORAL
  Filled 2014-07-01 (×3): qty 1

## 2014-07-01 MED ORDER — LACTATED RINGERS IV SOLN
INTRAVENOUS | Status: DC
  Administered 2014-07-01: 15:00:00 via INTRAVENOUS

## 2014-07-01 MED ORDER — NAPHAZOLINE HCL 0.1 % OP SOLN
2.0000 [drp] | Freq: Four times a day (QID) | OPHTHALMIC | Status: DC | PRN
  Filled 2014-07-01: qty 15

## 2014-07-01 MED ORDER — PROMETHAZINE HCL 25 MG/ML IJ SOLN: 6.2500 mg | INTRAMUSCULAR | Status: DC | PRN

## 2014-07-01 MED ORDER — ONDANSETRON HCL 4 MG PO TABS: 4.0000 mg | ORAL_TABLET | Freq: Four times a day (QID) | ORAL | Status: DC | PRN

## 2014-07-01 MED ORDER — HYDROMORPHONE HCL 1 MG/ML IJ SOLN: 0.5000 mg | INTRAMUSCULAR | Status: DC | PRN

## 2014-07-01 MED ORDER — ONDANSETRON HCL 4 MG/2ML IJ SOLN
INTRAMUSCULAR | Status: DC | PRN
  Administered 2014-07-01: 4 mg via INTRAVENOUS

## 2014-07-01 MED ORDER — GLYCOPYRROLATE 0.2 MG/ML IJ SOLN
INTRAMUSCULAR | Status: AC
  Filled 2014-07-01: qty 2

## 2014-07-01 MED ORDER — OXYCODONE HCL 5 MG PO TABS: 5.0000 mg | ORAL_TABLET | Freq: Once | ORAL | Status: DC | PRN

## 2014-07-01 MED ORDER — MIDAZOLAM HCL 2 MG/2ML IJ SOLN
INTRAMUSCULAR | Status: AC
  Filled 2014-07-01: qty 2

## 2014-07-01 MED ORDER — FERROUS SULFATE 325 (65 FE) MG PO TABS
325.0000 mg | ORAL_TABLET | Freq: Three times a day (TID) | ORAL | Status: DC
  Administered 2014-07-02: 325 mg via ORAL
  Filled 2014-07-01 (×4): qty 1

## 2014-07-01 MED ORDER — OXYCODONE HCL 5 MG PO TABS
5.0000 mg | ORAL_TABLET | ORAL | Status: DC | PRN
  Filled 2014-07-01: qty 1

## 2014-07-01 MED ORDER — PHENYLEPHRINE HCL 10 MG/ML IJ SOLN
INTRAMUSCULAR | Status: DC | PRN
  Administered 2014-07-01 (×2): 80 ug via INTRAVENOUS
  Administered 2014-07-01: 40 ug via INTRAVENOUS

## 2014-07-01 MED ORDER — 0.9 % SODIUM CHLORIDE (POUR BTL) OPTIME
TOPICAL | Status: DC | PRN
  Administered 2014-07-01: 1000 mL

## 2014-07-01 MED ORDER — LIDOCAINE HCL (CARDIAC) 20 MG/ML IV SOLN
INTRAVENOUS | Status: AC
  Filled 2014-07-01: qty 5

## 2014-07-01 MED ORDER — PROPOFOL 10 MG/ML IV BOLUS
INTRAVENOUS | Status: DC | PRN
Start: 2014-07-01 — End: 2014-07-01
  Administered 2014-07-01: 180 mg via INTRAVENOUS

## 2014-07-01 MED ORDER — OXYCODONE HCL 5 MG/5ML PO SOLN: 5.0000 mg | Freq: Once | ORAL | Status: DC | PRN

## 2014-07-01 MED ORDER — INSULIN DETEMIR 100 UNIT/ML ~~LOC~~ SOLN
10.0000 [IU] | Freq: Every day | SUBCUTANEOUS | Status: DC
  Administered 2014-07-01: 10 [IU] via SUBCUTANEOUS
  Filled 2014-07-01 (×2): qty 0.1

## 2014-07-01 MED ORDER — LACTATED RINGERS IV SOLN
INTRAVENOUS | Status: DC | PRN
  Administered 2014-07-01: 18:00:00 via INTRAVENOUS

## 2014-07-01 MED ORDER — GLUCERNA SHAKE PO LIQD
237.0000 mL | ORAL | Status: DC
  Administered 2014-07-01: 237 mL via ORAL

## 2014-07-01 MED ORDER — HYDROMORPHONE HCL 1 MG/ML IJ SOLN: 0.2500 mg | INTRAMUSCULAR | Status: DC | PRN

## 2014-07-01 MED ORDER — METOCLOPRAMIDE HCL 5 MG/ML IJ SOLN: 5.0000 mg | Freq: Three times a day (TID) | INTRAMUSCULAR | Status: DC | PRN

## 2014-07-01 MED ORDER — ARTIFICIAL TEARS OP OINT
TOPICAL_OINTMENT | OPHTHALMIC | Status: AC
  Filled 2014-07-01: qty 3.5

## 2014-07-01 MED ORDER — LIDOCAINE HCL (CARDIAC) 20 MG/ML IV SOLN
INTRAVENOUS | Status: AC
Start: 2014-07-01 — End: 2014-07-01
  Filled 2014-07-01: qty 5

## 2014-07-01 SURGICAL SUPPLY — 57 items
BANDAGE ELASTIC 6 VELCRO ST LF (GAUZE/BANDAGES/DRESSINGS) ×2 IMPLANT
BANDAGE ESMARK 6X9 LF (GAUZE/BANDAGES/DRESSINGS) ×1 IMPLANT
BLADE LONG MED 31MMX9MM (MISCELLANEOUS) ×1
BLADE LONG MED 31X9 (MISCELLANEOUS) ×1 IMPLANT
BLADE SAW RECIP 87.9 MT (BLADE) ×3 IMPLANT
BNDG CMPR 9X6 STRL LF SNTH (GAUZE/BANDAGES/DRESSINGS) ×1
BNDG COHESIVE 6X5 TAN STRL LF (GAUZE/BANDAGES/DRESSINGS) ×6 IMPLANT
BNDG ESMARK 6X9 LF (GAUZE/BANDAGES/DRESSINGS) ×3
BNDG GAUZE ELAST 4 BULKY (GAUZE/BANDAGES/DRESSINGS) ×2 IMPLANT
BNDG GAUZE STRTCH 6 (GAUZE/BANDAGES/DRESSINGS) ×6 IMPLANT
COTTON STERILE ROLL (GAUZE/BANDAGES/DRESSINGS) ×3 IMPLANT
COVER SURGICAL LIGHT HANDLE (MISCELLANEOUS) ×3 IMPLANT
CUFF TOURNIQUET SINGLE 24IN (TOURNIQUET CUFF) ×2 IMPLANT
CUFF TOURNIQUET SINGLE 34IN LL (TOURNIQUET CUFF) IMPLANT
DRAIN PENROSE 1/2X12 LTX STRL (WOUND CARE) IMPLANT
DRAPE EXTREMITY T 121X128X90 (DRAPE) ×3 IMPLANT
DRAPE PROXIMA HALF (DRAPES) ×3 IMPLANT
DRAPE U-SHAPE 47X51 STRL (DRAPES) ×3 IMPLANT
DRSG ADAPTIC 3X8 NADH LF (GAUZE/BANDAGES/DRESSINGS) ×3 IMPLANT
DRSG EMULSION OIL 3X3 NADH (GAUZE/BANDAGES/DRESSINGS) ×2 IMPLANT
DURAPREP 26ML APPLICATOR (WOUND CARE) ×3 IMPLANT
ELECT CAUTERY BLADE 6.4 (BLADE) IMPLANT
ELECT REM PT RETURN 9FT ADLT (ELECTROSURGICAL) ×3
ELECTRODE REM PT RTRN 9FT ADLT (ELECTROSURGICAL) ×1 IMPLANT
GAUZE SPONGE 4X4 12PLY STRL (GAUZE/BANDAGES/DRESSINGS) ×3 IMPLANT
GLOVE BIOGEL PI ORTHO PRO 7.5 (GLOVE) ×2
GLOVE BIOGEL PI ORTHO PRO SZ8 (GLOVE) ×2
GLOVE ORTHO TXT STRL SZ7.5 (GLOVE) ×3 IMPLANT
GLOVE PI ORTHO PRO STRL 7.5 (GLOVE) ×1 IMPLANT
GLOVE PI ORTHO PRO STRL SZ8 (GLOVE) ×1 IMPLANT
GLOVE SURG ORTHO 8.5 STRL (GLOVE) ×3 IMPLANT
GOWN STRL REUS W/ TWL LRG LVL3 (GOWN DISPOSABLE) ×1 IMPLANT
GOWN STRL REUS W/ TWL XL LVL3 (GOWN DISPOSABLE) ×2 IMPLANT
GOWN STRL REUS W/TWL LRG LVL3 (GOWN DISPOSABLE) ×3
GOWN STRL REUS W/TWL XL LVL3 (GOWN DISPOSABLE) ×6
KIT BASIN OR (CUSTOM PROCEDURE TRAY) ×3 IMPLANT
KIT ROOM TURNOVER OR (KITS) ×3 IMPLANT
MANIFOLD NEPTUNE II (INSTRUMENTS) ×3 IMPLANT
NS IRRIG 1000ML POUR BTL (IV SOLUTION) ×3 IMPLANT
PACK GENERAL/GYN (CUSTOM PROCEDURE TRAY) ×3 IMPLANT
PAD ABD 8X10 STRL (GAUZE/BANDAGES/DRESSINGS) ×2 IMPLANT
PAD ARMBOARD 7.5X6 YLW CONV (MISCELLANEOUS) ×6 IMPLANT
PAD CAST 4YDX4 CTTN HI CHSV (CAST SUPPLIES) ×1 IMPLANT
PADDING CAST COTTON 4X4 STRL (CAST SUPPLIES) ×3
PADDING CAST COTTON 6X4 STRL (CAST SUPPLIES) ×3 IMPLANT
SPONGE GAUZE 4X4 12PLY STER LF (GAUZE/BANDAGES/DRESSINGS) ×2 IMPLANT
SPONGE LAP 18X18 X RAY DECT (DISPOSABLE) IMPLANT
STAPLER VISISTAT 35W (STAPLE) IMPLANT
SUT ETHILON 2 0 FS 18 (SUTURE) ×4 IMPLANT
SUT PDS AB 1 CT  36 (SUTURE)
SUT PDS AB 1 CT 36 (SUTURE) IMPLANT
SUT SILK 2 0 (SUTURE) ×3
SUT SILK 2-0 18XBRD TIE 12 (SUTURE) ×1 IMPLANT
TOWEL OR 17X24 6PK STRL BLUE (TOWEL DISPOSABLE) ×3 IMPLANT
TOWEL OR 17X26 10 PK STRL BLUE (TOWEL DISPOSABLE) ×3 IMPLANT
TUBE ANAEROBIC SPECIMEN COL (MISCELLANEOUS) IMPLANT
WATER STERILE IRR 1000ML POUR (IV SOLUTION) ×3 IMPLANT

## 2014-07-01 NOTE — Anesthesia Procedure Notes (Signed)
Procedure Name: LMA Insertion Date/Time: 07/01/2014 6:21 PM Performed by: Armandina GemmaMIRARCHI, Jaeveon [place] Pre-anesthesia Checklist: Patient identified, Timeout performed, Emergency Drugs available, Suction available and Patient being monitored Patient Re-evaluated:Patient Re-evaluated prior to inductionOxygen Delivery Method: Circle system utilized Preoxygenation: Pre-oxygenation with 100% oxygen Intubation Type: IV induction Ventilation: Mask ventilation without difficulty LMA: LMA inserted LMA Size: 4.0 Tube type: Oral Number of attempts: 1 Placement Confirmation: positive ETCO2 and breath sounds checked- equal and bilateral Tube secured with: Tape Dental Injury: Teeth and Oropharynx as per pre-operative assessment  Comments: IV induction Joslin- LMA insertion atraumatic- teeth and mouth as preop- bilat BS Joslin

## 2014-07-01 NOTE — Transfer of Care (Signed)
Immediate Anesthesia Transfer of Care Note  Patient: Ryan PellantRonald R Castrogiovanni  Procedure(s) Performed: Procedure(s): LEFT First Metatarsal RAY AMPUTATION  (Left)  Patient Location: PACU  Anesthesia Type:General  Level of Consciousness: awake and alert   Airway & Oxygen Therapy: Patient Spontanous Breathing and Patient connected to nasal cannula oxygen  Post-op Assessment: Report given to PACU RN and Post -op Vital signs reviewed and stable  Post vital signs: Reviewed and stable  Complications: No apparent anesthesia complications

## 2014-07-01 NOTE — Progress Notes (Signed)
Paged pharmacy for completion of pt. Home med reconciliation @ this time.

## 2014-07-01 NOTE — Plan of Care (Signed)
Problem: Phase I Progression Outcomes Goal: Voiding-avoid urinary catheter unless indicated Outcome: Completed/Met Date Met:  07/01/14     

## 2014-07-01 NOTE — Anesthesia Preprocedure Evaluation (Signed)
Anesthesia Evaluation  Patient identified by MRN, date of birth, ID band Patient awake    Reviewed: Allergy & Precautions, H&P , NPO status   History of Anesthesia Complications Negative for: history of anesthetic complications  Airway Mallampati: II  TM Distance: >3 FB Neck ROM: Full    Dental  (+) Poor Dentition, Dental Advisory Given   Pulmonary    Pulmonary exam normal       Cardiovascular hypertension, Pt. on medications + Peripheral Vascular Disease     Neuro/Psych negative neurological ROS  negative psych ROS   GI/Hepatic negative GI ROS, Neg liver ROS,   Endo/Other  diabetes  Renal/GU negative Renal ROS     Musculoskeletal   Abdominal   Peds  Hematology   Anesthesia Other Findings   Reproductive/Obstetrics                             Anesthesia Physical Anesthesia Plan  ASA: III  Anesthesia Plan: General   Post-op Pain Management:    Induction: Intravenous  Airway Management Planned: LMA  Additional Equipment:   Intra-op Plan:   Post-operative Plan: Extubation in OR  Informed Consent: I have reviewed the patients History and Physical, chart, labs and discussed the procedure including the risks, benefits and alternatives for the proposed anesthesia with the patient or authorized representative who has indicated his/her understanding and acceptance.   Dental advisory given  Plan Discussed with: CRNA, Anesthesiologist and Surgeon  Anesthesia Plan Comments:         Anesthesia Quick Evaluation

## 2014-07-01 NOTE — Interval H&P Note (Signed)
History and Physical Interval Note:  07/01/2014 6:10 PM  Ryan [name]  has presented today for surgery, with the diagnosis of LEFT GREAT TOE OSTEOMYLITIS  The various methods of treatment have been discussed with the patient and family. After consideration of risks, benefits and other options for treatment, the patient has consented to  Procedure(s): LEFT GREAT TOE RAY AMPUTATION  (Left) as a surgical intervention .  The patient's history has been reviewed, patient examined, no change in status, stable for surgery.  I have reviewed the patient's chart and labs.  Questions were answered to the patient's satisfaction.     Jatia Musa,STEVEN R

## 2014-07-01 NOTE — Anesthesia Postprocedure Evaluation (Signed)
  Anesthesia Post-op Note  Patient: Ryan [name]  Procedure(s) Performed: Procedure(s): LEFT First Metatarsal RAY AMPUTATION  (Left)  Patient Location: PACU  Anesthesia Type:General  Level of Consciousness: awake, alert  and oriented  Airway and Oxygen Therapy: Patient Spontanous Breathing  Post-op Pain: none  Post-op Assessment: Post-op Vital signs reviewed, Patient's Cardiovascular Status Stable, Respiratory Function Stable, Patent Airway, No signs of Nausea or vomiting and Pain level controlled  Post-op Vital Signs: stable  Last Vitals:  Filed Vitals:   07/01/14 2010  BP: 143/72  Pulse: 72  Temp: 36.7 C  Resp: 16    Complications: No apparent anesthesia complications

## 2014-07-01 NOTE — Brief Op Note (Signed)
07/01/2014  6:52 PM  PATIENT:  Christiana Pellantonald R Waszak  53 y.o. male  PRE-OPERATIVE DIAGNOSIS:  Wound Dehiscence, s/p L great toe amputation for osteomyelitis  POST-OPERATIVE DIAGNOSIS:  Wound Dehiscence s/p L great toe amputation for osteomyelitis  PROCEDURE:  Procedure(s): LEFT First Metatarsal RAY AMPUTATION  (Left), primary wound closure  SURGEON:  Surgeon(s) and Role:    * Verlee RossettiSteven R Kennidi Yoshida, MD - Primary  PHYSICIAN ASSISTANT:   ASSISTANTS: none   ANESTHESIA:   general  EBL:     BLOOD ADMINISTERED:none  DRAINS: none   LOCAL MEDICATIONS USED:  NONE  SPECIMEN:  No Specimen and Source of Specimen:  Great toe metatarsal head and distal 1/2 shaft  DISPOSITION OF SPECIMEN:  discarded  COUNTS:  YES  TOURNIQUET:   Total Tourniquet Time Documented: Calf (Left) - 19 minutes Total: Calf (Left) - 19 minutes   DICTATION: .Other Dictation: Dictation Number [id number]  PLAN OF CARE: Admit for overnight observation  PATIENT DISPOSITION:  PACU - hemodynamically stable.   Delay start of Pharmacological VTE agent (>24hrs) due to surgical blood loss or risk of bleeding: not applicable

## 2014-07-01 NOTE — Plan of Care (Signed)
Problem: Phase I Progression Outcomes Goal: Incision/dressings dry and intact Outcome: Completed/Met Date Met:  07/01/14

## 2014-07-01 NOTE — Progress Notes (Signed)
Paged pharmacy for update on completion of pt. Med rec, pt. On list per pharmacy tech and will be completed shortly.

## 2014-07-02 ENCOUNTER — Encounter (HOSPITAL_COMMUNITY): Payer: Self-pay

## 2014-07-02 LAB — BASIC METABOLIC PANEL
Anion gap: 10 (ref 5–15)
BUN: 9 mg/dL (ref 6–23)
CO2: 27 meq/L (ref 19–32)
Calcium: 8.9 mg/dL (ref 8.4–10.5)
Chloride: 102 mEq/L (ref 96–112)
Creatinine, Ser: 0.65 mg/dL (ref 0.50–1.35)
GFR calc Af Amer: >90 mL/min (ref >90)
GLUCOSE: 72 mg/dL (ref 70–99)
POTASSIUM: 3.9 meq/L (ref 3.7–5.3)
SODIUM: 139 meq/L (ref 137–147)

## 2014-07-02 LAB — GLUCOSE, CAPILLARY
GLUCOSE-CAPILLARY: 128 mg/dL — AB (ref 70–99)
Glucose-Capillary: 110 mg/dL — ABNORMAL HIGH (ref 70–99)

## 2014-07-02 MED ORDER — OXYCODONE-ACETAMINOPHEN 5-325 MG PO TABS: 1.0000 | ORAL_TABLET | ORAL | Status: DC | PRN

## 2014-07-02 MED ORDER — CLINDAMYCIN HCL 300 MG PO CAPS: 600.0000 mg | ORAL_CAPSULE | Freq: Three times a day (TID) | ORAL | Status: DC

## 2014-07-02 NOTE — Progress Notes (Signed)
Orthopedics Progress Note  Subjective: Patient reports some pain with the left foot but not too bad.  Objective:  Filed Vitals:   07/02/14 0639  BP: 124/72  Pulse: 80  Temp: 98.1 F (36.7 C)  Resp: 16    General: Awake and alert  Musculoskeletal: left foot elevated and dressing clean and dry and intact Neurovascularly intact  Lab Results  Component Value Date   WBC 9.3 07/01/2014   HGB 12.1* 07/01/2014   HCT 36.5* 07/01/2014   MCV 91.7 07/01/2014   PLT 243 07/01/2014       Component Value Date/Time   NA 139 07/02/2014 0229   K 3.9 07/02/2014 0229   CL 102 07/02/2014 0229   CO2 27 07/02/2014 0229   GLUCOSE 72 07/02/2014 0229   BUN 9 07/02/2014 0229   CREATININE 0.65 07/02/2014 0229   CALCIUM 8.9 07/02/2014 0229   GFRNONAA >90 07/02/2014 0229   GFRAA >90 07/02/2014 0229    No results found for: INR, PROTIME  Assessment/Plan: POD #`1 s/p Procedure(s): LEFT First Metatarsal RAY AMPUTATION  Ok for D/C home on po antibiotics, with instructions to elevate the leg above the heart F/U next Tuesday  Viviann SpareSteven R. Ranell PatrickNorris, MD 07/02/2014 7:57 AM

## 2014-07-02 NOTE — Op Note (Signed)
NAMOrland Dec:  [name], Ryan              ACCOUNT NO.:  [id number]  MEDICAL RECORD NO.:  [id number]  LOCATION:  6N15C                        FACILITY:  [facility]  PHYSICIAN:  Almedia BallsSteven R. Ranell PatrickNorris, M.D. DATE OF BIRTH:  21-Oct-1960  DATE OF PROCEDURE:  07/01/2014 DATE OF DISCHARGE:                              OPERATIVE REPORT   PREOPERATIVE DIAGNOSIS:  Left great toe amputation with wound dehiscence, prominent metatarsal head.  POSTOPERATIVE DIAGNOSES:  Left great toe amputation with wound dehiscence, prominent metatarsal head.  PROCEDURE PERFORMED:  Left great toe revision to ray amputation with primary closure of wound.  ATTENDING SURGEON:  Almedia BallsSteven R. Ranell PatrickNorris, M.D.  ASSISTANT:  None.  ANESTHESIA:  LMA general anesthesia was used.  ESTIMATED BLOOD LOSS:  Minimal.  FLUID REPLACEMENT:  With 1000 mL, crystalloid.  INSTRUMENT COUNTS:  Correct.  COMPLICATIONS:  There were no complications.  ANTIBIOTICS:  Perioperative antibiotics were given.  INDICATIONS:  Patient is a 53 year old male who has had a prior left great toe amputation for osteomyelitis in the interphalangeal joint area.  The patient has presented back to Orthopedics with a wound dehiscence of his great toe amputation wound with a prominent metatarsal head and evident metatarsal head cartilage visible through the wound. We discussed that this needs to be taken back to ray amputation which would decrease the stress on the wound and allow for proper wound healing.  The patient agreed and informed consent obtained.  DESCRIPTION OF PROCEDURE:  After adequate level of anesthesia was achieved, the patient was positioned supine on the operating room table. Nonsterile tourniquet placed on the proximal calf.  Left leg was sterilely prepped and draped in usual manner.  Time-out called.  We elevated the limb and then elevated the tourniquet to 250 mmHg. Longitudinal incision was created along the medial border of the metatarsal to the  great toe on the left foot.  Subperiosteal dissection performed of the great toe metatarsal.  We performed a ray amputation at the junction of the base of the middle third of the metatarsal, bevelling that, so that there was less pressure laterally and dorsally. We beveled all the edges to make sure they were nice and smooth.  We removed the sesamoid bones and we had nice dorsal flap and also plantar flap.  We irrigated thoroughly with a liter of irrigation and closed the wound with interrupted nylon closure.  Sterile compressive bandage was applied. There was superficial ulcerations on the lateral side of the foot which we irrigated and then we dressed it with Adaptic dressing and will be watching that closely.  Patient was transported to recovery room in stable condition having tolerated the surgery well.     Almedia BallsSteven R. Ranell PatrickNorris, M.D.     SRN/MEDQ  D:  07/01/2014  T:  07/02/2014  Job:  [id number]

## 2014-07-02 NOTE — Discharge Summary (Signed)
Physician Discharge Summary   Patient ID: Ryan [name] MRN: [id number] DOB/AGE: 08-14-1960 53 y.o.  Admit date: 07/01/2014 Discharge date: 07/02/2014  Admission Diagnoses:  Active Problems:   Toe amputation status   Discharge Diagnoses:  Same   Surgeries: Procedure(s): LEFT First Metatarsal RAY AMPUTATION  on 07/01/2014   Consultants: none  Discharged Condition: Stable  Hospital Course: Ryan [name] is an 53 y.o. male who was admitted 07/01/2014 with a chief complaint of left great toe wound dehiscence, and found to have a diagnosis of Left great toe wound dehiscence.  They were brought to the operating room on 07/01/2014 and underwent the above named procedures.    The patient had an uncomplicated hospital course and was stable for discharge.  Recent vital signs:  Filed Vitals:   07/02/14 0639  BP: 124/72  Pulse: 80  Temp: 98.1 F (36.7 C)  Resp: 16    Recent laboratory studies:  Results for orders placed or performed during the hospital encounter of 07/01/14  Basic metabolic panel  Result Value Ref Range   Sodium 137 137 - 147 mEq/L   Potassium 4.0 3.7 - 5.3 mEq/L   Chloride 100 96 - 112 mEq/L   CO2 24 19 - 32 mEq/L   Glucose, Bld 155 (H) 70 - 99 mg/dL   BUN 11 6 - 23 mg/dL   Creatinine, Ser 4.090.59 0.50 - 1.35 mg/dL   Calcium 8.8 8.4 - 81.110.5 mg/dL   GFR calc non Af Amer >90 >90 mL/min   GFR calc Af Amer >90 >90 mL/min   Anion gap 13 5 - 15  CBC  Result Value Ref Range   WBC 9.3 4.0 - 10.5 K/uL   RBC 3.98 (L) 4.22 - 5.81 MIL/uL   Hemoglobin 12.1 (L) 13.0 - 17.0 g/dL   HCT 91.436.5 (L) 78.239.0 - 95.652.0 %   MCV 91.7 78.0 - 100.0 fL   MCH 30.4 26.0 - 34.0 pg   MCHC 33.2 30.0 - 36.0 g/dL   RDW 21.313.7 08.611.5 - 57.815.5 %   Platelets 243 150 - 400 K/uL  Glucose, capillary  Result Value Ref Range   Glucose-Capillary 125 (H) 70 - 99 mg/dL  Glucose, capillary  Result Value Ref Range   Glucose-Capillary 103 (H) 70 - 99 mg/dL  Basic metabolic panel  Result Value Ref  Range   Sodium 139 137 - 147 mEq/L   Potassium 3.9 3.7 - 5.3 mEq/L   Chloride 102 96 - 112 mEq/L   CO2 27 19 - 32 mEq/L   Glucose, Bld 72 70 - 99 mg/dL   BUN 9 6 - 23 mg/dL   Creatinine, Ser 4.690.65 0.50 - 1.35 mg/dL   Calcium 8.9 8.4 - 62.910.5 mg/dL   GFR calc non Af Amer >90 >90 mL/min   GFR calc Af Amer >90 >90 mL/min   Anion gap 10 5 - 15  Glucose, capillary  Result Value Ref Range   Glucose-Capillary 151 (H) 70 - 99 mg/dL   Comment 1 Notify RN     Discharge Medications:     Medication List    ASK your doctor about these medications        B-12 PO  Take 1 tablet by mouth daily.     feeding supplement (GLUCERNA SHAKE) Liqd  Take 237 mLs by mouth daily.     feeding supplement (PRO-STAT SUGAR FREE 64) Liqd  Place 30 mLs into feeding tube 2 (two) times daily with a meal.  ferrous sulfate 325 (65 FE) MG tablet  Take 1 tablet (325 mg total) by mouth 3 (three) times daily after meals.     glucose blood test strip  Commonly known as:  TRUETEST TEST  Use as instructed     insulin detemir 100 UNIT/ML injection  Commonly known as:  LEVEMIR  Inject 0.1 mLs (10 Units total) into the skin at bedtime.     Insulin Syringe-Needle U-100 30G X 5/16" 0.5 ML Misc  1 Syringe by Does not apply route daily.     lisinopril 20 MG tablet  Commonly known as:  PRINIVIL,ZESTRIL  Take 1 tablet (20 mg total) by mouth daily.     naproxen sodium 220 MG tablet  Commonly known as:  ANAPROX  Take 220 mg by mouth 2 (two) times daily as needed (pain).     oxyCODONE 5 MG immediate release tablet  Commonly known as:  Oxy IR/ROXICODONE  Take 1 tablet (5 mg total) by mouth every 4 (four) hours as needed for moderate pain.     tetrahydrozoline 0.05 % ophthalmic solution  Place 1 drop into both eyes daily as needed (dry,itchy eyes).     TRUEPLUS LANCETS 33G Misc  1 each by Does not apply route 3 (three) times daily before meals.        Diagnostic Studies: Dg Toe Great Left  06/08/2014    CLINICAL DATA:  Pain and swelling of the great toe without known injury; infection 4 months ago with interval healing and now parrot Re infection.  EXAM: LEFT GREAT TOE  COMPARISON:  Report of an MRI of the left foot of January 12, 2014.  FINDINGS: There are destructive bony changes of the distal phalanx of the great toe. A pathologic fracture is present proximally. The proximal phalanx is intact. The interphalangeal joint space is preserved. There is soft tissue swelling diffusely over the first digit.  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   Electronically Signed   By: David  [place]azil[place]   On: 06/08/2014 13:13    Disposition: 01-Home or Self Care        Follow-up Information    Follow up with Thane Age,STEVEN R, MD In 4 days.   Specialty:  Orthopedic Surgery   Why:  next Tuesday, [phone]   Contact information:   [address] [place] [address] [phone]        Signed: Verlee RossettiORRIS,STEVEN R 07/02/2014, 8:00 AM

## 2014-07-02 NOTE — Care Management Note (Signed)
  Page 1 of 1   07/02/2014     10:35:03 AM CARE MANAGEMENT NOTE 07/02/2014  Patient:  Ryan [name],Ryan [name]   Account Number:  [id number]  Date Initiated:  07/02/2014  Documentation initiated by:  Ronny FlurryWILE,Haylie Mccutcheon  Subjective/Objective Assessment:     Action/Plan:   Anticipated DC Date:  07/02/2014   Anticipated DC Plan:  HOME/SELF CARE  In-house referral  Financial Counselor      DC Planning Services  CM consult  MATCH Program  [facility]      Choice offered to / List presented to:             Status of service:  Completed, signed off Medicare Important Message given?   (If response is "NO", the following Medicare IM given date fields will be blank) Date Medicare IM given:   Medicare IM given by:   Date Additional Medicare IM given:   Additional Medicare IM given by:    Discharge Disposition:    Per UR Regulation:    If discussed at Long Length of Stay Meetings, dates discussed:    Comments:  07-02-14 Referral for medication assitance . Patient [facility] , however co pays are $10 ( which he does not have ) and he cannot get oxycodone filled there.  [facility] received voice mail.  Patient has used MATCH this year , however, unable to fill prescriptions including antibiotic . [facility] with exception for 14 days of Oxycodone , and over ride for co pays . Patient will receive 14 days of oxycodone and antibiotic prescription in full , free of cost to him.  Patient stated he is able to go [facility] one of [facility] .  Ronny FlurryHeather Samaira Holzworth RN BSN

## 2014-07-02 NOTE — Discharge Instructions (Signed)
Elevate the left foot above the heart at all times.  Keep the dressing in place until you see Dr Ranell PatrickNorris again in the office  [facility] next Tuesday (4 days)  Use cane and bear weight on the heel with the cast shoe  Take antibiotics until completed

## 2014-07-02 NOTE — Progress Notes (Signed)
Orthopedic Tech Progress Note Patient Details:  Ryan PellantRonald R [name] Mar 04, 1961 [id number]  Ortho Devices Type of Ortho Device: Postop shoe/boot Ortho Device/Splint Location: lle Ortho Device/Splint Interventions: Application   Zan Triska 07/02/2014, 8:17 AM

## 2014-07-03 ENCOUNTER — Encounter (HOSPITAL_COMMUNITY): Payer: Self-pay | Admitting: Orthopedic Surgery

## 2014-07-08 ENCOUNTER — Inpatient Hospital Stay (HOSPITAL_COMMUNITY)
Admission: AD | Admit: 2014-07-08 | Discharge: 2014-07-10 | DRG: 603 | Disposition: A | Discharge status: Home / Self Care | Payer: No Typology Code available for payment source | Source: Ambulatory Visit | Attending: Orthopedic Surgery | Admitting: Orthopedic Surgery

## 2014-07-08 ENCOUNTER — Encounter (HOSPITAL_COMMUNITY): Payer: Self-pay | Admitting: General Practice

## 2014-07-08 ENCOUNTER — Other Ambulatory Visit (HOSPITAL_COMMUNITY): Payer: Self-pay | Admitting: Physician Assistant

## 2014-07-08 DIAGNOSIS — Z89412 Acquired absence of left great toe: Secondary | ICD-10-CM

## 2014-07-08 DIAGNOSIS — Z794 Long term (current) use of insulin: Secondary | ICD-10-CM

## 2014-07-08 DIAGNOSIS — L03116 Cellulitis of left lower limb: Principal | ICD-10-CM | POA: Diagnosis present

## 2014-07-08 DIAGNOSIS — Z89411 Acquired absence of right great toe: Secondary | ICD-10-CM

## 2014-07-08 LAB — CBC WITH DIFFERENTIAL/PLATELET
BASOS ABS: 0 10*3/uL (ref 0.0–0.1)
Basophils Relative: 0 % (ref 0–1)
Eosinophils Absolute: 0.3 10*3/uL (ref 0.0–0.7)
Eosinophils Relative: 4 % (ref 0–5)
HEMATOCRIT: 35.6 % — AB (ref 39.0–52.0)
Hemoglobin: 11.6 g/dL — ABNORMAL LOW (ref 13.0–17.0)
Lymphocytes Relative: 20 % (ref 12–46)
Lymphs Abs: 1.6 10*3/uL (ref 0.7–4.0)
MCH: 29.8 pg (ref 26.0–34.0)
MCHC: 32.6 g/dL (ref 30.0–36.0)
MCV: 91.5 fL (ref 78.0–100.0)
MONO ABS: 0.6 10*3/uL (ref 0.1–1.0)
Monocytes Relative: 8 % (ref 3–12)
NEUTROS ABS: 5.4 10*3/uL (ref 1.7–7.7)
Neutrophils Relative %: 68 % (ref 43–77)
Platelets: 290 10*3/uL (ref 150–400)
RBC: 3.89 MIL/uL — ABNORMAL LOW (ref 4.22–5.81)
RDW: 13.7 % (ref 11.5–15.5)
WBC: 8 10*3/uL (ref 4.0–10.5)

## 2014-07-08 LAB — BASIC METABOLIC PANEL
ANION GAP: 12 (ref 5–15)
BUN: 13 mg/dL (ref 6–23)
CALCIUM: 9 mg/dL (ref 8.4–10.5)
CO2: 25 meq/L (ref 19–32)
CREATININE: 0.63 mg/dL (ref 0.50–1.35)
Chloride: 100 mEq/L (ref 96–112)
GFR calc non Af Amer: >90 mL/min (ref >90)
Glucose, Bld: 256 mg/dL — ABNORMAL HIGH (ref 70–99)
Potassium: 4.2 mEq/L (ref 3.7–5.3)
Sodium: 137 mEq/L (ref 137–147)

## 2014-07-08 LAB — GLUCOSE, CAPILLARY
Glucose-Capillary: 239 mg/dL — ABNORMAL HIGH (ref 70–99)
Glucose-Capillary: 220 mg/dL — ABNORMAL HIGH (ref 70–99)

## 2014-07-08 MED ORDER — INSULIN DETEMIR 100 UNIT/ML ~~LOC~~ SOLN
10.0000 [IU] | SUBCUTANEOUS | Status: DC
  Administered 2014-07-09 – 2014-07-10 (×2): 10 [IU] via SUBCUTANEOUS
  Filled 2014-07-08 (×3): qty 0.1

## 2014-07-08 MED ORDER — LISINOPRIL 20 MG PO TABS
20.0000 mg | ORAL_TABLET | Freq: Every day | ORAL | Status: DC
  Administered 2014-07-09 – 2014-07-10 (×2): 20 mg via ORAL
  Filled 2014-07-08 (×2): qty 1

## 2014-07-08 MED ORDER — OXYCODONE-ACETAMINOPHEN 5-325 MG PO TABS
1.0000 | ORAL_TABLET | ORAL | Status: DC | PRN
  Administered 2014-07-08 – 2014-07-09 (×2): 2 via ORAL
  Filled 2014-07-08 (×2): qty 2

## 2014-07-08 MED ORDER — HYDROCODONE-ACETAMINOPHEN 5-325 MG PO TABS: 1.0000 | ORAL_TABLET | ORAL | Status: DC | PRN

## 2014-07-08 MED ORDER — VANCOMYCIN HCL 10 G IV SOLR
1500.0000 mg | Freq: Two times a day (BID) | INTRAVENOUS | Status: DC
  Administered 2014-07-08 – 2014-07-10 (×5): 1500 mg via INTRAVENOUS
  Filled 2014-07-08 (×6): qty 1500

## 2014-07-08 MED ORDER — SODIUM CHLORIDE 0.9 % IV SOLN
3.0000 g | Freq: Four times a day (QID) | INTRAVENOUS | Status: DC
  Administered 2014-07-09 – 2014-07-10 (×7): 3 g via INTRAVENOUS
  Filled 2014-07-08 (×12): qty 3

## 2014-07-08 MED ORDER — FERROUS SULFATE 325 (65 FE) MG PO TABS
325.0000 mg | ORAL_TABLET | Freq: Three times a day (TID) | ORAL | Status: DC
  Administered 2014-07-08 – 2014-07-10 (×6): 325 mg via ORAL
  Filled 2014-07-08 (×9): qty 1

## 2014-07-08 MED ORDER — NAPHAZOLINE HCL 0.1 % OP SOLN
1.0000 [drp] | Freq: Four times a day (QID) | OPHTHALMIC | Status: DC | PRN
  Filled 2014-07-08: qty 15

## 2014-07-08 MED ORDER — ONDANSETRON HCL 4 MG/2ML IJ SOLN: 4.0000 mg | Freq: Four times a day (QID) | INTRAMUSCULAR | Status: DC | PRN

## 2014-07-08 NOTE — Progress Notes (Signed)
Paged Dr. Ranell PatrickNorris office to advise was on [address] [place]. FYI, spoke with Alphonsa OverallBrad Dixon, PA, states all orders should be in.

## 2014-07-08 NOTE — Progress Notes (Signed)
ANTIBIOTIC CONSULT NOTE - INITIAL  Pharmacy Consult for Vancomycin Indication: Cellulitis with recent osteo   Allergies  Allergen Reactions  . Metformin And Related Other (See Comments)    Pt states that metformin made sugars fall into 30's    Patient Measurements:   Adjusted Body Weight: n/a   Vital Signs: Temp: 98.3 F (36.8 C) (12/09 1150) Temp Source: Oral (12/09 1150) BP: 108/57 mmHg (12/09 1150) Pulse Rate: 78 (12/09 1150) Intake/Output from previous day:   Intake/Output from this shift:    Labs: No results for input(s): WBC, HGB, PLT, LABCREA, CREATININE in the last 72 hours. Estimated Creatinine Clearance: 116.3 mL/min (by C-G formula based on Cr of 0.65). No results for input(s): VANCOTROUGH, VANCOPEAK, VANCORANDOM, GENTTROUGH, GENTPEAK, GENTRANDOM, TOBRATROUGH, TOBRAPEAK, TOBRARND, AMIKACINPEAK, AMIKACINTROU, AMIKACIN in the last 72 hours.   Microbiology: Recent Results (from the past 720 hour(s))  Blood Cultures x 2 sites     Status: None   Collection Time: 06/08/14  6:44 PM  Result Value Ref Range Status   Specimen Description BLOOD RIGHT ARM  Final   Special Requests BOTTLES DRAWN AEROBIC AND ANAEROBIC 10CC  Final   Culture  Setup Time   Final    06/08/2014 22:39 Performed at [facility]    Culture   Final    NO GROWTH 5 DAYS Performed at [facility]    Report Status 06/14/2014 FINAL  Final  Blood Cultures x 2 sites     Status: None   Collection Time: 06/08/14  6:53 PM  Result Value Ref Range Status   Specimen Description BLOOD RIGHT HAND  Final   Special Requests BOTTLES DRAWN AEROBIC AND ANAEROBIC 10CC  Final   Culture  Setup Time   Final    06/08/2014 22:36 Performed at [facility]    Culture   Final    NO GROWTH 5 DAYS Performed at [facility]    Report Status 06/14/2014 FINAL  Final  Surgical pcr screen     Status: None   Collection Time: 06/08/14  7:39 PM  Result Value Ref Range Status   MRSA, PCR  NEGATIVE NEGATIVE Final   Staphylococcus aureus NEGATIVE NEGATIVE Final    Comment:        The Xpert SA Assay (FDA approved for NASAL specimens in patients over 51 years of age), is one component of a comprehensive surveillance program.  Test performance has been validated by [facility] for patients greater than or equal to 51 year old. It is not intended to diagnose infection nor to guide or monitor treatment.     Medical History: Past Medical History  Diagnosis Date  . Hypertension   . Osteomyelitis of foot   . Heart murmur     was told "not to worry about it"  . Peripheral vascular disease     "poor circulation" in feet  . Diabetes mellitus without complication     type 2  . Diabetic neuropathy   . Arthritis   . Anemia     low iron    Medications:  Prescriptions prior to admission  Medication Sig Dispense Refill Last Dose  . Amino Acids-Protein Hydrolys (FEEDING SUPPLEMENT, PRO-STAT SUGAR FREE 64,) LIQD Place 30 mLs into feeding tube 2 (two) times daily with a meal. (Patient not taking: Reported on 07/01/2014) 900 mL 0 Not Taking at Unknown time  . clindamycin (CLEOCIN) 300 MG capsule Take 2 capsules (600 mg total) by mouth 3 (three) times daily. 60 capsule  0   . Cyanocobalamin (B-12 PO) Take 1 tablet by mouth daily.   Past Month at Unknown time  . feeding supplement, GLUCERNA SHAKE, (GLUCERNA SHAKE) LIQD Take 237 mLs by mouth daily. (Patient not taking: Reported on 06/23/2014)  0 Not Taking at Unknown time  . ferrous sulfate 325 (65 FE) MG tablet Take 1 tablet (325 mg total) by mouth 3 (three) times daily after meals. (Patient not taking: Reported on 07/01/2014) 90 tablet 1 Not Taking at Unknown time  . glucose blood (TRUETEST TEST) test strip Use as instructed 100 each 12 06/30/2014 at Unknown time  . insulin detemir (LEVEMIR) 100 UNIT/ML injection Inject 0.1 mLs (10 Units total) into the skin at bedtime. (Patient taking differently: Inject 10 Units into the skin every  morning. ) 4 vial 3 06/30/2014 at Unknown time  . Insulin Syringe-Needle U-100 30G X 5/16" 0.5 ML MISC 1 Syringe by Does not apply route daily. 100 each 12 06/30/2014 at Unknown time  . lisinopril (PRINIVIL,ZESTRIL) 20 MG tablet Take 1 tablet (20 mg total) by mouth daily. 90 tablet 3 06/30/2014 at Unknown time  . naproxen sodium (ANAPROX) 220 MG tablet Take 220 mg by mouth 2 (two) times daily as needed (pain).    months ago  . oxyCODONE (OXY IR/ROXICODONE) 5 MG immediate release tablet Take 1 tablet (5 mg total) by mouth every 4 (four) hours as needed for moderate pain. (Patient not taking: Reported on 06/23/2014) 10 tablet 0 Not Taking at Unknown time  . oxyCODONE-acetaminophen (ROXICET) 5-325 MG per tablet Take 1-2 tablets by mouth every 4 (four) hours as needed for severe pain. 30 tablet 0   . tetrahydrozoline 0.05 % ophthalmic solution Place 1 drop into both eyes daily as needed (dry,itchy eyes).   06/30/2014 at Unknown time  . TRUEPLUS LANCETS 33G MISC 1 each by Does not apply route 3 (three) times daily before meals. 100 each 1 06/30/2014 at Unknown time   Assessment: [age] YOM with recent osteomyelitis to left great toe with amputation now with new erythema and purulence to 5th metatarsal head. Pt was on cleocin s/p surgery and pharmacy consulted to start vancomycin to try and save the foot. CrCl > 100 mL/min. Afebrile   Goal of Therapy:  Vancomycin trough level 10-15 mcg/ml  Plan:  -Vancomycin 1500 mg IV Q 12 hours  -Monitor CBC and patient's clinical progress -[facility]   Vinnie LevelBenjamin Tyja Gortney, PharmD., BCPS Clinical Pharmacist Pager [phone]

## 2014-07-08 NOTE — Progress Notes (Signed)
Orthopedics Progress Note  Subjective: Patient reports no pain in the foot  Objective:  Filed Vitals:   07/08/14 2100  BP: 147/71  Pulse: 72  Temp: 98.2 F (36.8 C)  Resp: 18    General: Awake and alert  Musculoskeletal: left foot much improved today with elevation and IV antibiotics Decreased erythema and swelling noted in the foot Neurovascularly intact  Lab Results  Component Value Date   WBC 8.0 07/08/2014   HGB 11.6* 07/08/2014   HCT 35.6* 07/08/2014   MCV 91.5 07/08/2014   PLT 290 07/08/2014       Component Value Date/Time   NA 137 07/08/2014 1255   K 4.2 07/08/2014 1255   CL 100 07/08/2014 1255   CO2 25 07/08/2014 1255   GLUCOSE 256* 07/08/2014 1255   BUN 13 07/08/2014 1255   CREATININE 0.63 07/08/2014 1255   CALCIUM 9.0 07/08/2014 1255   GFRNONAA >90 07/08/2014 1255   GFRAA >90 07/08/2014 1255    No results found for: INR, PROTIME  Assessment/Plan: POD #3 s/p revision amputation left great toe with cellulitis Continue IV antibiotics and elevation Daily dressing change  Viviann SpareSteven R. Ranell PatrickNorris, MD 07/08/2014 10:06 PM

## 2014-07-08 NOTE — Progress Notes (Signed)
Pt has compression dressing to left foot due to sx.  Dsg changed this am in orthopedic office, clean, dry, intact.  Pt states has bilateral great toe amp.  Pt denies weakness.

## 2014-07-08 NOTE — H&P (Signed)
Ryan [name] is an 53 y.o. male.    Chief Complaint: left foot swelling worsening erythema  HPI: 53 y/o male with recent osteomyelitis to left great toe with amputation then follow up ray amputation presented for post op visit with new erythema and purulence to 5th metatarsal head. Had local I&D in the office but f/u in 24 hours shows worsening erythema thus requiring admission for IV antibiotics to try and save the foot. Currently pt has been on cleocin s/p surgery. Denies any fevers chills or illnesses.  PCP:  Jeanann LewandowskyJEGEDE, OLUGBEMIGA, MD  PMH: Past Medical History  Diagnosis Date  . Hypertension   . Osteomyelitis of foot   . Heart murmur     was told "not to worry about it"  . Peripheral vascular disease     "poor circulation" in feet  . Diabetes mellitus without complication     type 2  . Diabetic neuropathy   . Arthritis   . Anemia     low iron    PSH: Past Surgical History  Procedure Laterality Date  . Amputation Right 01/13/2014    Procedure: Right great toe amputation;  Surgeon: Jacki Conesonald A Gioffre, MD;  Location: [facility];  Service: Orthopedics;  Laterality: Right;  . Knee surgery Right   . Arm surgery Left     due to broken arm  . Amputation Left 06/08/2014    Procedure: AMPUTATION LEFT GREAT TOE;  Surgeon: Verlee RossettiSteven R Norris, MD;  Location: [facility];  Service: Orthopedics;  Laterality: Left;  . Amputation Left 07/01/2014    Procedure: LEFT First Metatarsal RAY AMPUTATION ;  Surgeon: Verlee RossettiSteven R Norris, MD;  [facility];  Service: Orthopedics;  Laterality: Left;    Social History:  reports that he has never smoked. His smokeless tobacco use includes Snuff. He reports that he drinks alcohol. He reports that he does not use illicit drugs.  Allergies:  Allergies  Allergen Reactions  . Metformin And Related Other (See Comments)    Pt states that metformin made sugars fall into 30's    Medications: Current Outpatient Prescriptions  Medication Sig Dispense Refill  . Amino  Acids-Protein Hydrolys (FEEDING SUPPLEMENT, PRO-STAT SUGAR FREE 64,) LIQD Place 30 mLs into feeding tube 2 (two) times daily with a meal. (Patient not taking: Reported on 07/01/2014) 900 mL 0  . clindamycin (CLEOCIN) 300 MG capsule Take 2 capsules (600 mg total) by mouth 3 (three) times daily. 60 capsule 0  . Cyanocobalamin (B-12 PO) Take 1 tablet by mouth daily.    . feeding supplement, GLUCERNA SHAKE, (GLUCERNA SHAKE) LIQD Take 237 mLs by mouth daily. (Patient not taking: Reported on 06/23/2014)  0  . ferrous sulfate 325 (65 FE) MG tablet Take 1 tablet (325 mg total) by mouth 3 (three) times daily after meals. (Patient not taking: Reported on 07/01/2014) 90 tablet 1  . glucose blood (TRUETEST TEST) test strip Use as instructed 100 each 12  . insulin detemir (LEVEMIR) 100 UNIT/ML injection Inject 0.1 mLs (10 Units total) into the skin at bedtime. (Patient taking differently: Inject 10 Units into the skin every morning. ) 4 vial 3  . Insulin Syringe-Needle U-100 30G X 5/16" 0.5 ML MISC 1 Syringe by Does not apply route daily. 100 each 12  . lisinopril (PRINIVIL,ZESTRIL) 20 MG tablet Take 1 tablet (20 mg total) by mouth daily. 90 tablet 3  . naproxen sodium (ANAPROX) 220 MG tablet Take 220 mg by mouth 2 (two) times daily as needed (pain).     .Marland Kitchen  oxyCODONE (OXY IR/ROXICODONE) 5 MG immediate release tablet Take 1 tablet (5 mg total) by mouth every 4 (four) hours as needed for moderate pain. (Patient not taking: Reported on 06/23/2014) 10 tablet 0  . oxyCODONE-acetaminophen (ROXICET) 5-325 MG per tablet Take 1-2 tablets by mouth every 4 (four) hours as needed for severe pain. 30 tablet 0  . tetrahydrozoline 0.05 % ophthalmic solution Place 1 drop into both eyes daily as needed (dry,itchy eyes).    . TRUEPLUS LANCETS 33G MISC 1 each by Does not apply route 3 (three) times daily before meals. 100 each 1   No current facility-administered medications for this visit.    No results found for this or any  previous visit (from the past 48 hour(s)). No results found.  ROS: ROS Recent ray amputation to left 1st metatarsal Hx of right great to amputation  Physical Exam: Alert and appropriate 53 y/o male in no acute distress Left foot with healing ray amputation incision to medial foot Lateral foot with mild erythema no purulence currently with mild edema nv intact distally Minimally antalgic gait   Physical Exam   Assessment/Plan Assessment: left foot worsening cellulitis and possible early osteomyelitis to 5th metatarsal  Plan: Recommend admission to the hospital for IV antibiotics Will contact Dr. Lajoyce Cornersuda for his expertise with this matter Pain control as needed.    @MSBSIG @

## 2014-07-09 LAB — GLUCOSE, CAPILLARY
GLUCOSE-CAPILLARY: 176 mg/dL — AB (ref 70–99)
GLUCOSE-CAPILLARY: 191 mg/dL — AB (ref 70–99)
GLUCOSE-CAPILLARY: 160 mg/dL — AB (ref 70–99)
Glucose-Capillary: 186 mg/dL — ABNORMAL HIGH (ref 70–99)
Glucose-Capillary: 238 mg/dL — ABNORMAL HIGH (ref 70–99)

## 2014-07-09 MED ORDER — INSULIN ASPART 100 UNIT/ML ~~LOC~~ SOLN: 0.0000 [IU] | Freq: Every day | SUBCUTANEOUS | Status: DC

## 2014-07-09 MED ORDER — INSULIN ASPART 100 UNIT/ML ~~LOC~~ SOLN
0.0000 [IU] | Freq: Three times a day (TID) | SUBCUTANEOUS | Status: DC
  Administered 2014-07-09 – 2014-07-10 (×2): 3 [IU] via SUBCUTANEOUS
  Administered 2014-07-10: 2 [IU] via SUBCUTANEOUS

## 2014-07-09 NOTE — Plan of Care (Signed)
Problem: Phase I Progression Outcomes Goal: Pain controlled with appropriate interventions Outcome: Completed/Met Date Met:  07/09/14 Goal: OOB as tolerated unless otherwise ordered Outcome: Completed/Met Date Met:  07/09/14 Goal: Incision/dressings dry and intact Outcome: Completed/Met Date Met:  07/09/14 Goal: Sutures/staples intact Outcome: Progressing Unable to assess, incision covered with surgical dressing. Goal: Tubes/drains patent Outcome: Not Applicable Date Met:  10/40/45 Goal: Initial discharge plan identified Outcome: Completed/Met Date Met:  07/09/14 Goal: Voiding-avoid urinary catheter unless indicated Outcome: Completed/Met Date Met:  07/09/14 Goal: Vital signs/hemodynamically stable Outcome: Completed/Met Date Met:  07/09/14 Goal: Other Phase I Outcomes/Goals Outcome: Not Applicable Date Met:  91/36/85  Problem: Phase II Progression Outcomes Goal: Pain controlled Outcome: Completed/Met Date Met:  07/09/14

## 2014-07-09 NOTE — Progress Notes (Signed)
Inpatient Diabetes Program Recommendations  AACE/ADA: New Consensus Statement on Inpatient Glycemic Control (2013)  Target Ranges:  Prepandial:   less than 140 mg/dL      Peak postprandial:   less than 180 mg/dL (1-2 hours)      Critically ill patients:  140 - 180 mg/dL   Results for Ryan [name], Ryan [name] (MRN [id number]) as of 07/09/2014 10:12  Ref. Range 07/08/2014 12:39 07/08/2014 17:06 07/09/2014 06:59 07/09/2014 07:59  Glucose-Capillary Latest Range: 70-99 mg/dL 409239 (H) 811220 (H) 914176 (H) 186 (H)   Diabetes history: DM2 Outpatient Diabetes medications: Levemir 10 units QHS Current orders for Inpatient glycemic control: Levemir 10 units QAM  Inpatient Diabetes Program Recommendations Correction (SSI): Please order CBGs with Novolog correction scale ACHS.  Thanks, Orlando PennerMarie Sheyli Horwitz, RN, MSN, CCRN, CDE Diabetes Coordinator Inpatient Diabetes Program [phone] (Team Pager) [phone] ([facility] office) [phone] [facility] office)

## 2014-07-09 NOTE — Progress Notes (Signed)
   Subjective:     Recheck left foot s/p IV antibiotics for 24 hours Denies pain in the foot Pt wants to go home but discussed he could lose his foot if he does not have the foot treated  Patient reports pain as none.  Objective:   VITALS:   Filed Vitals:   07/09/14 0901  BP: 141/78  Pulse: 78  Temp: 98 F (36.7 C)  Resp: 17    Left foot: well healing medial incision from ray amputation Improved erythema to left lateral foot nv intact distally   LABS  Recent Labs  07/08/14 1255  HGB 11.6*  HCT 35.6*  WBC 8.0  PLT 290     Recent Labs  07/08/14 1255  NA 137  K 4.2  BUN 13  CREATININE 0.63  GLUCOSE 256*     Assessment/Plan:    Left foot cellulitis Formal dressing change tomorrow  Continue IV antibiotics  Pain control as needed      Alphonsa OverallBrad Dixon, MPAS, PA-C  07/09/2014, 1:46 PM

## 2014-07-10 LAB — VANCOMYCIN, TROUGH: Vancomycin Tr: 16.8 ug/mL (ref 10.0–20.0)

## 2014-07-10 LAB — GLUCOSE, CAPILLARY
Glucose-Capillary: 142 mg/dL — ABNORMAL HIGH (ref 70–99)
Glucose-Capillary: 189 mg/dL — ABNORMAL HIGH (ref 70–99)

## 2014-07-10 MED ORDER — DOXYCYCLINE HYCLATE 50 MG PO CAPS: 100.0000 mg | ORAL_CAPSULE | Freq: Two times a day (BID) | ORAL | Status: DC

## 2014-07-10 NOTE — Progress Notes (Signed)
ANTIBIOTIC CONSULT NOTE - Follow Up  Pharmacy Consult for Vancomycin Indication: Cellulitis with recent osteo   Allergies  Allergen Reactions  . Metformin And Related Other (See Comments)    Pt states that metformin made sugars fall into 30's    Patient Measurements: Height: 5\' 11"  (180.3 cm) Weight: 165 lb 6.4 oz (75.025 kg) IBW/kg (Calculated) : 75.3 Adjusted Body Weight: n/a   Vital Signs: Temp: 98.4 F (36.9 C) (12/11 0925) Temp Source: Oral (12/11 0925) BP: 119/74 mmHg (12/11 0925) Pulse Rate: 74 (12/11 0925) Intake/Output from previous day: 12/10 0701 - 12/11 0700 In: 1060 [P.O.:360; IV Piggyback:700] Out: 0  Intake/Output from this shift: Total I/O In: 600 [P.O.:600] Out: 0   Labs:  Recent Labs  07/08/14 1255  WBC 8.0  HGB 11.6*  PLT 290  CREATININE 0.63   Estimated Creatinine Clearance: 115.9 mL/min (by C-G formula based on Cr of 0.63).  Recent Labs  07/10/14 1241  VANCOTROUGH 16.8     Microbiology: No results found for this or any previous visit (from the past 720 hour(s)).  Medical History: Past Medical History  Diagnosis Date  . Hypertension   . Osteomyelitis of foot   . Heart murmur     was told "not to worry about it"  . Peripheral vascular disease     "poor circulation" in feet  . Diabetes mellitus without complication     type 2  . Diabetic neuropathy   . Arthritis   . Anemia     low iron    Medications:  Prescriptions prior to admission  Medication Sig Dispense Refill Last Dose  . clindamycin (CLEOCIN) 300 MG capsule Take 2 capsules (600 mg total) by mouth 3 (three) times daily. 60 capsule 0 07/08/2014 at Unknown time  . insulin detemir (LEVEMIR) 100 UNIT/ML injection Inject 0.1 mLs (10 Units total) into the skin at bedtime. (Patient taking differently: Inject 10 Units into the skin every morning. ) 4 vial 3 07/08/2014 at Unknown time  . lisinopril (PRINIVIL,ZESTRIL) 20 MG tablet Take 1 tablet (20 mg total) by mouth daily. 90  tablet 3 07/08/2014 at Unknown time  . naproxen sodium (ANAPROX) 220 MG tablet Take 220 mg by mouth 2 (two) times daily as needed (pain).    Past Month at Unknown time  . tetrahydrozoline 0.05 % ophthalmic solution Place 1 drop into both eyes daily as needed (dry,itchy eyes).   Past Week at Unknown time  . ferrous sulfate 325 (65 FE) MG tablet Take 1 tablet (325 mg total) by mouth 3 (three) times daily after meals. (Patient not taking: Reported on 07/01/2014) 90 tablet 1 Not Taking at Unknown time  . oxyCODONE (OXY IR/ROXICODONE) 5 MG immediate release tablet Take 1 tablet (5 mg total) by mouth every 4 (four) hours as needed for moderate pain. (Patient not taking: Reported on 06/23/2014) 10 tablet 0 Not Taking at Unknown time  . oxyCODONE-acetaminophen (ROXICET) 5-325 MG per tablet Take 1-2 tablets by mouth every 4 (four) hours as needed for severe pain. (Patient not taking: Reported on 07/08/2014) 30 tablet 0 Not Taking at Unknown time   Assessment: [age] YOM with recent osteomyelitis to left great toe with amputation now with new erythema and purulence to 5th metatarsal head. Pt was on cleocin s/p surgery and now on vancomycin to try and save the foot. CrCl > 100 mL/min. Afebrile. VT today was therapeutic.   Goal of Therapy:  Vancomycin trough level 10-15 mcg/ml  Plan:  -Continue Vancomycin 1500 mg  IV Q 12 hours  -Monitor CBC and patient's clinical progress -Discharging today on clindamycin.   Vinnie LevelBenjamin Jonathon Castelo, PharmD., BCPS Clinical Pharmacist Pager [phone]

## 2014-07-10 NOTE — Discharge Summary (Signed)
Physician Discharge Summary   Patient ID: Ryan PellantRonald R Nath MRN: [id number] DOB/AGE: 1961/02/03 53 y.o.  Admit date: 07/08/2014 Discharge date: 07/10/2014  Admission Diagnoses:  Active Problems:   Cellulitis of left foot   Discharge Diagnoses:  Same   Surgeries:  on * No surgery found *   Consultants: none  Discharged Condition: Stable  Hospital Course: Ryan [name] is an 53 y.o. male who was admitted 07/08/2014 with a chief complaint of No chief complaint on file. , and found to have a diagnosis of <principal problem not specified>.  They were brought to the operating room on * No surgery found * and underwent the above named procedures.    The patient had an uncomplicated hospital course and was stable for discharge.  Recent vital signs:  Filed Vitals:   07/10/14 0925  BP: 119/74  Pulse: 74  Temp: 98.4 F (36.9 C)  Resp: 18    Recent laboratory studies:  Results for orders placed or performed during the hospital encounter of 07/08/14  Basic metabolic panel  Result Value Ref Range   Sodium 137 137 - 147 mEq/L   Potassium 4.2 3.7 - 5.3 mEq/L   Chloride 100 96 - 112 mEq/L   CO2 25 19 - 32 mEq/L   Glucose, Bld 256 (H) 70 - 99 mg/dL   BUN 13 6 - 23 mg/dL   Creatinine, Ser 6.960.63 0.50 - 1.35 mg/dL   Calcium 9.0 8.4 - 29.510.5 mg/dL   GFR calc non Af Amer >90 >90 mL/min   GFR calc Af Amer >90 >90 mL/min   Anion gap 12 5 - 15  CBC WITH DIFFERENTIAL  Result Value Ref Range   WBC 8.0 4.0 - 10.5 K/uL   RBC 3.89 (L) 4.22 - 5.81 MIL/uL   Hemoglobin 11.6 (L) 13.0 - 17.0 g/dL   HCT 28.435.6 (L) 13.239.0 - 44.052.0 %   MCV 91.5 78.0 - 100.0 fL   MCH 29.8 26.0 - 34.0 pg   MCHC 32.6 30.0 - 36.0 g/dL   RDW 10.213.7 72.511.5 - 36.615.5 %   Platelets 290 150 - 400 K/uL   Neutrophils Relative % 68 43 - 77 %   Neutro Abs 5.4 1.7 - 7.7 K/uL   Lymphocytes Relative 20 12 - 46 %   Lymphs Abs 1.6 0.7 - 4.0 K/uL   Monocytes Relative 8 3 - 12 %   Monocytes Absolute 0.6 0.1 - 1.0 K/uL   Eosinophils  Relative 4 0 - 5 %   Eosinophils Absolute 0.3 0.0 - 0.7 K/uL   Basophils Relative 0 0 - 1 %   Basophils Absolute 0.0 0.0 - 0.1 K/uL  Glucose, capillary  Result Value Ref Range   Glucose-Capillary 239 (H) 70 - 99 mg/dL  Glucose, capillary  Result Value Ref Range   Glucose-Capillary 220 (H) 70 - 99 mg/dL  Glucose, capillary  Result Value Ref Range   Glucose-Capillary 176 (H) 70 - 99 mg/dL  Glucose, capillary  Result Value Ref Range   Glucose-Capillary 186 (H) 70 - 99 mg/dL  Glucose, capillary  Result Value Ref Range   Glucose-Capillary 238 (H) 70 - 99 mg/dL  Glucose, capillary  Result Value Ref Range   Glucose-Capillary 191 (H) 70 - 99 mg/dL  Glucose, capillary  Result Value Ref Range   Glucose-Capillary 160 (H) 70 - 99 mg/dL  Glucose, capillary  Result Value Ref Range   Glucose-Capillary 142 (H) 70 - 99 mg/dL    Discharge Medications:  Medication List    ASK your doctor about these medications        clindamycin 300 MG capsule  Commonly known as:  CLEOCIN  Take 2 capsules (600 mg total) by mouth 3 (three) times daily.     ferrous sulfate 325 (65 FE) MG tablet  Take 1 tablet (325 mg total) by mouth 3 (three) times daily after meals.     insulin detemir 100 UNIT/ML injection  Commonly known as:  LEVEMIR  Inject 0.1 mLs (10 Units total) into the skin at bedtime.     lisinopril 20 MG tablet  Commonly known as:  PRINIVIL,ZESTRIL  Take 1 tablet (20 mg total) by mouth daily.     naproxen sodium 220 MG tablet  Commonly known as:  ANAPROX  Take 220 mg by mouth 2 (two) times daily as needed (pain).     oxyCODONE 5 MG immediate release tablet  Commonly known as:  Oxy IR/ROXICODONE  Take 1 tablet (5 mg total) by mouth every 4 (four) hours as needed for moderate pain.     oxyCODONE-acetaminophen 5-325 MG per tablet  Commonly known as:  ROXICET  Take 1-2 tablets by mouth every 4 (four) hours as needed for severe pain.     tetrahydrozoline 0.05 % ophthalmic solution    Place 1 drop into both eyes daily as needed (dry,itchy eyes).        Diagnostic Studies: No results found.  Disposition: 01-Home or Self Care       Signed: Chalsea Darko B 07/10/2014, 10:26 AM

## 2014-07-10 NOTE — Progress Notes (Signed)
CARE MANAGEMENT NOTE 07/10/2014  Patient:  Ryan [name],Ryan [name]   Account Number:  [id number]  Date Initiated:  07/09/2014  Documentation initiated by:  ROYAL,CHERYL  Subjective/Objective Assessment:   CM following for progression and d/c planning.     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   (If response is "NO", the following Medicare IM given date fields will be blank) Date Medicare IM given:   Medicare IM given by:   Date Additional Medicare IM given:   Additional Medicare IM given by:    Discharge Disposition:  HOME/SELF CARE  Per UR Regulation:    If discussed at Long Length of Stay Meetings, dates discussed:    Comments:  07/10/2014 1130 NCM [facility]. Pt states he is established with clinic. Pt can follow up with clinic to pick up Rx at discounted rate. [facility] and pt can use a charge acct for $ 10 for the doxycycline. He will have to pay the $10 before getting meds in the future. Pt has appt with [facility] on 07/21/2014 at 1015. NCM made pt aware.  Isidoro DonningAlesia Tae Vonada RN CCM Case Mgmt phone [phone]

## 2014-07-10 NOTE — Progress Notes (Signed)
   Subjective:     Recheck left foot s/p IV antibiotics for cellulitis and osteomyleitis Pt doing fairly well Denies any pain to the foot Dressing change today Plan for d/c after 12 pm antibiotic dose Patient reports pain as none.  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

## 2014-07-10 NOTE — Progress Notes (Signed)
[facility]:  Patient admitted 07/08/14 for cellulitis of left foot. Patient has hospital follow-up appointment [facility] at 1015.  Patient aware of appointment time.  Patient to be discharged on doxycycline. Alden BenjaminAlesia Chavis, RN Case Manager [facility] arrangements [facility] for patient to get needed medication. [facility] informed to call [facility] [phone]) for [facility] application as he indicates he needs to reapply and discuss billing.  Patient appreciative of information.

## 2014-07-10 NOTE — Plan of Care (Signed)
Problem: Phase II Progression Outcomes Goal: Surgical site without signs of infection Outcome: Adequate for Discharge Unable to assess; post op dressing intact.

## 2014-07-21 ENCOUNTER — Ambulatory Visit: Payer: No Typology Code available for payment source | Attending: Internal Medicine | Admitting: Internal Medicine

## 2014-07-21 ENCOUNTER — Encounter: Payer: Self-pay | Admitting: Internal Medicine

## 2014-07-21 VITALS — BP 149/80 | HR 81 | Temp 97.9°F | Resp 16 | Ht 71.0 in | Wt 168.0 lb

## 2014-07-21 DIAGNOSIS — E1169 Type 2 diabetes mellitus with other specified complication: Secondary | ICD-10-CM | POA: Insufficient documentation

## 2014-07-21 DIAGNOSIS — E118 Type 2 diabetes mellitus with unspecified complications: Secondary | ICD-10-CM

## 2014-07-21 DIAGNOSIS — I739 Peripheral vascular disease, unspecified: Secondary | ICD-10-CM | POA: Insufficient documentation

## 2014-07-21 DIAGNOSIS — E11621 Type 2 diabetes mellitus with foot ulcer: Secondary | ICD-10-CM

## 2014-07-21 DIAGNOSIS — I10 Essential (primary) hypertension: Secondary | ICD-10-CM | POA: Insufficient documentation

## 2014-07-21 DIAGNOSIS — E119 Type 2 diabetes mellitus without complications: Secondary | ICD-10-CM

## 2014-07-21 DIAGNOSIS — M869 Osteomyelitis, unspecified: Secondary | ICD-10-CM | POA: Insufficient documentation

## 2014-07-21 LAB — GLUCOSE, POCT (MANUAL RESULT ENTRY): POC GLUCOSE: 225 mg/dL — AB (ref 70–99)

## 2014-07-21 NOTE — Progress Notes (Signed)
Pt is here following up on his HTN and diabetes. Pt seen an orthopedic doctor today to address his amputation. Pt reports taking his medications as prescribed.

## 2014-07-21 NOTE — Progress Notes (Signed)
Patient ID: Ryan [name], male   DOB: 22-Dec-1960, 53 y.o.   MRN: [id number]   Ryan BubaRonald Taborn, is a 53 y.o. male  [id number]  ONG:[id number]  DOB - 22-Dec-1960  Chief Complaint  Patient presents with  . Follow-up        Subjective:   Ryan [name] is a 53 y.o. male here today for a follow up visit. Patient has history of hypertension, osteomyelitis of the left foot, peripheral vascular disease, diabetes mellitus with diabetic neuropathic pain was recently at admitted for left foot infection post surgery/amputation of first and fifth toes, He developed left great toe wound dehiscent and was admitted and managed appropriately.  He is here today for follow-up. Patient has appointment with orthopedic doctor today to address his amputation and Wound Care. Patient is compliant with medications. Patient claims blood sugar is controlled. He has no need for refill today.Patient has No headache, No chest pain, No abdominal pain - No Nausea, No new weakness tingling or numbness, No Cough - SOB.  No problems updated.  ALLERGIES: Allergies  Allergen Reactions  . Metformin And Related Other (See Comments)    Pt states that metformin made sugars fall into 30's    PAST MEDICAL HISTORY: Past Medical History  Diagnosis Date  . Hypertension   . Osteomyelitis of foot   . Heart murmur     was told "not to worry about it"  . Peripheral vascular disease     "poor circulation" in feet  . Diabetes mellitus without complication     type 2  . Diabetic neuropathy   . Arthritis   . Anemia     low iron    MEDICATIONS AT HOME: Prior to Admission medications   Medication Sig Start Date End Date Taking? Authorizing Provider  doxycycline (VIBRAMYCIN) 50 MG capsule Take 2 capsules (100 mg total) by mouth 2 (two) times daily. 07/10/14  Yes Donnie Coffinhomas B Dixon, PA-C  insulin detemir (LEVEMIR) 100 UNIT/ML injection Inject 0.1 mLs (10 Units total) into the skin at bedtime. Patient taking differently:  Inject 10 Units into the skin every morning.  01/20/14  Yes Quentin Angstlugbemiga E Nea Gittens, MD  lisinopril (PRINIVIL,ZESTRIL) 20 MG tablet Take 1 tablet (20 mg total) by mouth daily. 06/23/14  Yes Quentin Angstlugbemiga E Ludmilla Mcgillis, MD  oxyCODONE-acetaminophen (ROXICET) 5-325 MG per tablet Take 1-2 tablets by mouth every 4 (four) hours as needed for severe pain. 07/02/14  Yes Verlee RossettiSteven R Norris, MD  tetrahydrozoline 0.05 % ophthalmic solution Place 1 drop into both eyes daily as needed (dry,itchy eyes).   Yes Historical Provider, MD  ferrous sulfate 325 (65 FE) MG tablet Take 1 tablet (325 mg total) by mouth 3 (three) times daily after meals. Patient not taking: Reported on 07/01/2014 01/15/14   Leatha Gildingostin M Gherghe, MD  naproxen sodium (ANAPROX) 220 MG tablet Take 220 mg by mouth 2 (two) times daily as needed (pain).     Historical Provider, MD  oxyCODONE (OXY IR/ROXICODONE) 5 MG immediate release tablet Take 1 tablet (5 mg total) by mouth every 4 (four) hours as needed for moderate pain. Patient not taking: Reported on 07/21/2014 06/11/14   Kathlen ModyVijaya Akula, MD     Objective:   Filed Vitals:   07/21/14 1005  BP: 149/80  Pulse: 81  Temp: 97.9 F (36.6 C)  TempSrc: Oral  Resp: 16  Height: 5\' 11"  (1.803 m)  Weight: 168 lb (76.204 kg)  SpO2: 99%    Exam General appearance : Awake, alert, not in any distress.  Speech Clear. Not toxic looking HEENT: Atraumatic and Normocephalic, pupils equally reactive to light and accomodation Neck: supple, no JVD. No cervical lymphadenopathy.  Chest:Good air entry bilaterally, no added sounds  CVS: S1 S2 regular, no murmurs.  Abdomen: Bowel sounds present, Non tender and not distended with no gaurding, rigidity or rebound. Extremities: B/L Lower Ext shows no edema, both legs are warm to touch Neurology: Awake alert, and oriented X 3, CN II-XII intact, Non focal Skin:No Rash Wounds:N/A  Data Review Lab Results  Component Value Date   HGBA1C 7.0* 06/08/2014   HGBA1C 6.9 05/07/2014    HGBA1C 7.6* 01/12/2014     Assessment & Plan   1. Type 2 diabetes mellitus without complication  - Glucose (CBG)  Aim for 2-3 Carb Choices per meal (30-45 grams) +/- 1 either way  Aim for 0-15 Carbs per snack if hungry  Include protein in moderation with your meals and snacks  Consider reading food labels for Total Carbohydrate and Fat Grams of foods  Consider checking BG at alternate times per day  Continue taking medication as directed Fruit Punch - find one with no sugar  Measure and decrease portions of carbohydrate foods  Make your plate and don't go back for seconds   2. Diabetic foot   Follow-up with orthopedic surgery today as scheduled.  Return in about 2 months (around 09/21/2014), or if symptoms worsen or fail to improve, for Hemoglobin A1C and Follow up, DM, Follow up HTN.  The patient was given clear instructions to go to ER or return to medical center if symptoms don't improve, worsen or new problems develop. The patient verbalized understanding. The patient was told to call to get lab results if they haven't heard anything in the next week.   This note has been created with Education officer, environmentalDragon speech recognition software and smart phrase technology. Any transcriptional errors are unintentional.    Jeanann LewandowskyJEGEDE, Sosie Gato, MD, MHA, FACP, FAAP [facility] [place], [place] [phone]   07/21/2014, 11:37 AM

## 2014-07-21 NOTE — Patient Instructions (Signed)
Diabetes and Exercise Exercising regularly is important. It is not just about losing weight. It has many health benefits, such as:  Improving your overall fitness, flexibility, and endurance.  Increasing your bone density.  Helping with weight control.  Decreasing your body fat.  Increasing your muscle strength.  Reducing stress and tension.  Improving your overall health. People with diabetes who exercise gain additional benefits because exercise:  Reduces appetite.  Improves the body's use of blood sugar (glucose).  Helps lower or control blood glucose.  Decreases blood pressure.  Helps control blood lipids (such as cholesterol and triglycerides).  Improves the body's use of the hormone insulin by:  Increasing the body's insulin sensitivity.  Reducing the body's insulin needs.  Decreases the risk for heart disease because exercising:  Lowers cholesterol and triglycerides levels.  Increases the levels of good cholesterol (such as high-density lipoproteins [HDL]) in the body.  Lowers blood glucose levels. YOUR ACTIVITY PLAN  Choose an activity that you enjoy and set realistic goals. Your health care provider or diabetes educator can help you make an activity plan that works for you. Exercise regularly as directed by your health care provider. This includes:  Performing resistance training twice a week such as push-ups, sit-ups, lifting weights, or using resistance bands.  Performing 150 minutes of cardio exercises each week such as walking, running, or playing sports.  Staying active and spending no more than 90 minutes at one time being inactive. Even short bursts of exercise are good for you. Three 10-minute sessions spread throughout the day are just as beneficial as a single 30-minute session. Some exercise ideas include:  Taking the dog for a walk.  Taking the stairs instead of the elevator.  Dancing to your favorite song.  Doing an exercise  video.  Doing your favorite exercise with a friend. RECOMMENDATIONS FOR EXERCISING WITH TYPE 1 OR TYPE 2 DIABETES   Check your blood glucose before exercising. If blood glucose levels are greater than 240 mg/dL, check for urine ketones. Do not exercise if ketones are present.  Avoid injecting insulin into areas of the body that are going to be exercised. For example, avoid injecting insulin into:  The arms when playing tennis.  The legs when jogging.  Keep a record of:  Food intake before and after you exercise.  Expected peak times of insulin action.  Blood glucose levels before and after you exercise.  The type and amount of exercise you have done.  Review your records with your health care provider. Your health care provider will help you to develop guidelines for adjusting food intake and insulin amounts before and after exercising.  If you take insulin or oral hypoglycemic agents, watch for signs and symptoms of hypoglycemia. They include:  Dizziness.  Shaking.  Sweating.  Chills.  Confusion.  Drink plenty of water while you exercise to prevent dehydration or heat stroke. Body water is lost during exercise and must be replaced.  Talk to your health care provider before starting an exercise program to make sure it is safe for you. Remember, almost any type of activity is better than none. Document Released: 10/07/2003 Document Revised: 12/01/2013 Document Reviewed: 12/24/2012 ExitCare Patient Information 2015 [employer]. This information is not intended to replace advice given to you by your health care provider. Make sure you discuss any questions you have with your health care provider. Basic Carbohydrate Counting for Diabetes Mellitus Carbohydrate counting is a method for keeping track of the amount of carbohydrates you eat.   Eating carbohydrates naturally increases the level of sugar (glucose) in your blood, so it is important for you to know the amount that is  okay for you to have in every meal. Carbohydrate counting helps keep the level of glucose in your blood within normal limits. The amount of carbohydrates allowed is different for every person. A dietitian can help you calculate the amount that is right for you. Once you know the amount of carbohydrates you can have, you can count the carbohydrates in the foods you want to eat. Carbohydrates are found in the following foods:  Grains, such as breads and cereals.  Dried beans and soy products.  Starchy vegetables, such as potatoes, peas, and corn.  Fruit and fruit juices.  Milk and yogurt.  Sweets and snack foods, such as cake, cookies, candy, chips, soft drinks, and fruit drinks. CARBOHYDRATE COUNTING There are two ways to count the carbohydrates in your food. You can use either of the methods or a combination of both. Reading the "Nutrition Facts" on Packaged Food The "Nutrition Facts" is an area that is included on the labels of almost all packaged food and beverages in the [place]. It includes the serving size of that food or beverage and information about the nutrients in each serving of the food, including the grams (g) of carbohydrate per serving.  Decide the number of servings of this food or beverage that you will be able to eat or drink. Multiply that number of servings by the number of grams of carbohydrate that is listed on the label for that serving. The total will be the amount of carbohydrates you will be having when you eat or drink this food or beverage. Learning Standard Serving Sizes of Food When you eat food that is not packaged or does not include "Nutrition Facts" on the label, you need to measure the servings in order to count the amount of carbohydrates.A serving of most carbohydrate-rich foods contains about 15 g of carbohydrates. The following list includes serving sizes of carbohydrate-rich foods that provide 15 g ofcarbohydrate per serving:   1 slice of bread  (1 oz) or 1 six-inch tortilla.    of a hamburger bun or English muffin.  4-6 crackers.   cup unsweetened dry cereal.    cup hot cereal.   cup rice or pasta.    cup mashed potatoes or  of a large baked potato.  1 cup fresh fruit or one small piece of fruit.    cup canned or frozen fruit or fruit juice.  1 cup milk.   cup plain fat-free yogurt or yogurt sweetened with artificial sweeteners.   cup cooked dried beans or starchy vegetable, such as peas, corn, or potatoes.  Decide the number of standard-size servings that you will eat. Multiply that number of servings by 15 (the grams of carbohydrates in that serving). For example, if you eat 2 cups of strawberries, you will have eaten 2 servings and 30 g of carbohydrates (2 servings x 15 g = 30 g). For foods such as soups and casseroles, in which more than one food is mixed in, you will need to count the carbohydrates in each food that is included. EXAMPLE OF CARBOHYDRATE COUNTING Sample Dinner  3 oz chicken breast.   cup of brown rice.   cup of corn.  1 cup milk.   1 cup strawberries with sugar-free whipped topping.  Carbohydrate Calculation Step 1: Identify the foods that contain carbohydrates:   Rice.   Corn.     Milk.   Strawberries. Step 2:Calculate the number of servings eaten of each:   2 servings of rice.   1 serving of corn.   1 serving of milk.   1 serving of strawberries. Step 3: Multiply each of those number of servings by 15 g:   2 servings of rice x 15 g = 30 g.   1 serving of corn x 15 g = 15 g.   1 serving of milk x 15 g = 15 g.   1 serving of strawberries x 15 g = 15 g. Step 4: Add together all of the amounts to find the total grams of carbohydrates eaten: 30 g + 15 g + 15 g + 15 g = 75 g. Document Released: 07/17/2005 Document Revised: 12/01/2013 Document Reviewed: 06/13/2013 ExitCare Patient Information 2015 [employer]. This information is not intended to  replace advice given to you by your health care provider. Make sure you discuss any questions you have with your health care provider.  

## 2014-08-24 ENCOUNTER — Ambulatory Visit: Payer: No Typology Code available for payment source | Attending: Internal Medicine

## 2014-08-27 ENCOUNTER — Encounter (HOSPITAL_COMMUNITY): Payer: Self-pay | Admitting: Emergency Medicine

## 2014-08-27 ENCOUNTER — Inpatient Hospital Stay (HOSPITAL_COMMUNITY)
Admission: EM | Admit: 2014-08-27 | Discharge: 2014-08-31 | DRG: 617 | Disposition: A | Discharge status: Home / Self Care | Payer: Medicaid Other | Attending: Orthopaedic Surgery | Admitting: Orthopaedic Surgery

## 2014-08-27 ENCOUNTER — Emergency Department (HOSPITAL_COMMUNITY): Payer: Medicaid Other

## 2014-08-27 DIAGNOSIS — E1165 Type 2 diabetes mellitus with hyperglycemia: Secondary | ICD-10-CM | POA: Diagnosis present

## 2014-08-27 DIAGNOSIS — M86172 Other acute osteomyelitis, left ankle and foot: Secondary | ICD-10-CM | POA: Diagnosis present

## 2014-08-27 DIAGNOSIS — Z794 Long term (current) use of insulin: Secondary | ICD-10-CM

## 2014-08-27 DIAGNOSIS — L97529 Non-pressure chronic ulcer of other part of left foot with unspecified severity: Secondary | ICD-10-CM | POA: Diagnosis present

## 2014-08-27 DIAGNOSIS — Z89412 Acquired absence of left great toe: Secondary | ICD-10-CM | POA: Diagnosis not present

## 2014-08-27 DIAGNOSIS — Z8249 Family history of ischemic heart disease and other diseases of the circulatory system: Secondary | ICD-10-CM | POA: Diagnosis not present

## 2014-08-27 DIAGNOSIS — E1140 Type 2 diabetes mellitus with diabetic neuropathy, unspecified: Secondary | ICD-10-CM | POA: Diagnosis present

## 2014-08-27 DIAGNOSIS — Z89411 Acquired absence of right great toe: Secondary | ICD-10-CM | POA: Diagnosis not present

## 2014-08-27 DIAGNOSIS — D72829 Elevated white blood cell count, unspecified: Secondary | ICD-10-CM | POA: Diagnosis present

## 2014-08-27 DIAGNOSIS — E118 Type 2 diabetes mellitus with unspecified complications: Secondary | ICD-10-CM | POA: Diagnosis present

## 2014-08-27 DIAGNOSIS — M1990 Unspecified osteoarthritis, unspecified site: Secondary | ICD-10-CM | POA: Diagnosis present

## 2014-08-27 DIAGNOSIS — IMO0002 Reserved for concepts with insufficient information to code with codable children: Secondary | ICD-10-CM

## 2014-08-27 DIAGNOSIS — Z833 Family history of diabetes mellitus: Secondary | ICD-10-CM

## 2014-08-27 DIAGNOSIS — F17220 Nicotine dependence, chewing tobacco, uncomplicated: Secondary | ICD-10-CM | POA: Diagnosis present

## 2014-08-27 DIAGNOSIS — I10 Essential (primary) hypertension: Secondary | ICD-10-CM | POA: Diagnosis present

## 2014-08-27 DIAGNOSIS — I96 Gangrene, not elsewhere classified: Secondary | ICD-10-CM | POA: Diagnosis present

## 2014-08-27 DIAGNOSIS — E1152 Type 2 diabetes mellitus with diabetic peripheral angiopathy with gangrene: Secondary | ICD-10-CM

## 2014-08-27 DIAGNOSIS — M79672 Pain in left foot: Secondary | ICD-10-CM | POA: Diagnosis present

## 2014-08-27 DIAGNOSIS — E1169 Type 2 diabetes mellitus with other specified complication: Secondary | ICD-10-CM | POA: Diagnosis present

## 2014-08-27 DIAGNOSIS — M869 Osteomyelitis, unspecified: Secondary | ICD-10-CM | POA: Diagnosis present

## 2014-08-27 LAB — BASIC METABOLIC PANEL
Anion gap: 9 (ref 5–15)
BUN: 12 mg/dL (ref 6–23)
CHLORIDE: 97 mmol/L (ref 96–112)
CO2: 27 mmol/L (ref 19–32)
Calcium: 9.1 mg/dL (ref 8.4–10.5)
Creatinine, Ser: 0.91 mg/dL (ref 0.50–1.35)
Glucose, Bld: 219 mg/dL — ABNORMAL HIGH (ref 70–99)
Potassium: 4.1 mmol/L (ref 3.5–5.1)
Sodium: 133 mmol/L — ABNORMAL LOW (ref 135–145)

## 2014-08-27 LAB — GLUCOSE, CAPILLARY: GLUCOSE-CAPILLARY: 182 mg/dL — AB (ref 70–99)

## 2014-08-27 LAB — SEDIMENTATION RATE: Sed Rate: 92 mm/hr — ABNORMAL HIGH (ref 0–16)

## 2014-08-27 LAB — CBC
HEMATOCRIT: 37.4 % — AB (ref 39.0–52.0)
HEMOGLOBIN: 12.6 g/dL — AB (ref 13.0–17.0)
MCH: 30.1 pg (ref 26.0–34.0)
MCHC: 33.7 g/dL (ref 30.0–36.0)
MCV: 89.5 fL (ref 78.0–100.0)
PLATELETS: 266 10*3/uL (ref 150–400)
RBC: 4.18 MIL/uL — ABNORMAL LOW (ref 4.22–5.81)
RDW: 13.2 % (ref 11.5–15.5)
WBC: 16.6 10*3/uL — ABNORMAL HIGH (ref 4.0–10.5)

## 2014-08-27 LAB — CBG MONITORING, ED: Glucose-Capillary: 148 mg/dL — ABNORMAL HIGH (ref 70–99)

## 2014-08-27 LAB — TSH: TSH: 1.494 u[IU]/mL (ref 0.350–4.500)

## 2014-08-27 MED ORDER — DOCUSATE SODIUM 100 MG PO CAPS
100.0000 mg | ORAL_CAPSULE | Freq: Two times a day (BID) | ORAL | Status: DC
  Administered 2014-08-27 – 2014-08-31 (×8): 100 mg via ORAL
  Filled 2014-08-27 (×8): qty 1

## 2014-08-27 MED ORDER — INSULIN ASPART 100 UNIT/ML ~~LOC~~ SOLN
0.0000 [IU] | Freq: Three times a day (TID) | SUBCUTANEOUS | Status: DC
  Administered 2014-08-29: 3 [IU] via SUBCUTANEOUS
  Administered 2014-08-29: 7 [IU] via SUBCUTANEOUS
  Administered 2014-08-30 (×2): 4 [IU] via SUBCUTANEOUS

## 2014-08-27 MED ORDER — SODIUM CHLORIDE 0.9 % IJ SOLN
3.0000 mL | Freq: Two times a day (BID) | INTRAMUSCULAR | Status: DC
  Administered 2014-08-29 – 2014-08-31 (×3): 3 mL via INTRAVENOUS

## 2014-08-27 MED ORDER — VANCOMYCIN HCL 10 G IV SOLR: 1500.0000 mg | Freq: Two times a day (BID) | INTRAVENOUS | Status: DC

## 2014-08-27 MED ORDER — SODIUM CHLORIDE 0.9 % IV SOLN: INTRAVENOUS | Status: DC

## 2014-08-27 MED ORDER — INSULIN ASPART 100 UNIT/ML ~~LOC~~ SOLN: 0.0000 [IU] | Freq: Every day | SUBCUTANEOUS | Status: DC

## 2014-08-27 MED ORDER — INSULIN DETEMIR 100 UNIT/ML ~~LOC~~ SOLN
10.0000 [IU] | Freq: Every day | SUBCUTANEOUS | Status: DC
  Administered 2014-08-29 – 2014-08-31 (×3): 10 [IU] via SUBCUTANEOUS
  Filled 2014-08-27 (×4): qty 0.1

## 2014-08-27 MED ORDER — VANCOMYCIN HCL IN DEXTROSE 1-5 GM/200ML-% IV SOLN
1000.0000 mg | Freq: Once | INTRAVENOUS | Status: AC
  Administered 2014-08-27: 1000 mg via INTRAVENOUS
  Filled 2014-08-27: qty 200

## 2014-08-27 MED ORDER — ONDANSETRON HCL 4 MG PO TABS: 4.0000 mg | ORAL_TABLET | Freq: Four times a day (QID) | ORAL | Status: DC | PRN

## 2014-08-27 MED ORDER — PIPERACILLIN-TAZOBACTAM 3.375 G IVPB
3.3750 g | Freq: Once | INTRAVENOUS | Status: AC
  Administered 2014-08-27: 3.375 g via INTRAVENOUS
  Filled 2014-08-27: qty 50

## 2014-08-27 MED ORDER — HYDRALAZINE HCL 20 MG/ML IJ SOLN: 5.0000 mg | Freq: Three times a day (TID) | INTRAMUSCULAR | Status: DC

## 2014-08-27 MED ORDER — ACETAMINOPHEN 650 MG RE SUPP: 650.0000 mg | Freq: Four times a day (QID) | RECTAL | Status: DC | PRN

## 2014-08-27 MED ORDER — METOPROLOL TARTRATE 1 MG/ML IV SOLN
2.5000 mg | Freq: Four times a day (QID) | INTRAVENOUS | Status: DC
  Administered 2014-08-28 – 2014-08-31 (×14): 2.5 mg via INTRAVENOUS
  Filled 2014-08-27 (×13): qty 5

## 2014-08-27 MED ORDER — HYDROMORPHONE HCL 1 MG/ML IJ SOLN: 1.0000 mg | INTRAMUSCULAR | Status: DC | PRN

## 2014-08-27 MED ORDER — METOPROLOL TARTRATE 1 MG/ML IV SOLN: 5.0000 mg | Freq: Once | INTRAVENOUS | Status: DC

## 2014-08-27 MED ORDER — HYDRALAZINE HCL 20 MG/ML IJ SOLN: 10.0000 mg | Freq: Three times a day (TID) | INTRAMUSCULAR | Status: DC

## 2014-08-27 MED ORDER — OXYCODONE HCL 5 MG PO TABS: 5.0000 mg | ORAL_TABLET | ORAL | Status: DC | PRN

## 2014-08-27 MED ORDER — ONDANSETRON HCL 4 MG/2ML IJ SOLN: 4.0000 mg | Freq: Four times a day (QID) | INTRAMUSCULAR | Status: DC | PRN

## 2014-08-27 MED ORDER — PIPERACILLIN-TAZOBACTAM 3.375 G IVPB
3.3750 g | Freq: Once | INTRAVENOUS | Status: DC
  Filled 2014-08-27: qty 50

## 2014-08-27 MED ORDER — PIPERACILLIN-TAZOBACTAM 3.375 G IVPB
3.3750 g | Freq: Three times a day (TID) | INTRAVENOUS | Status: DC
  Administered 2014-08-28 – 2014-08-30 (×8): 3.375 g via INTRAVENOUS
  Filled 2014-08-27 (×9): qty 50

## 2014-08-27 MED ORDER — HYDRALAZINE HCL 20 MG/ML IJ SOLN: 10.0000 mg | Freq: Four times a day (QID) | INTRAMUSCULAR | Status: DC | PRN

## 2014-08-27 MED ORDER — ACETAMINOPHEN 325 MG PO TABS
650.0000 mg | ORAL_TABLET | Freq: Four times a day (QID) | ORAL | Status: DC | PRN
  Administered 2014-08-27: 650 mg via ORAL
  Filled 2014-08-27: qty 2

## 2014-08-27 NOTE — Progress Notes (Signed)
Patient ID: Ryan PellantRonald R [name], male   DOB: [birth year], 54 y.o.   MRN: [id number] I have seen Mr. Ryan [name]'s left foot and talked to him in length about his situation.  He does need at minimum a left foot 5th ray resection, but most likely a transmetatarsal amputation.  He understands this fully.  ABI's in the am, then will proceed to surgery late afternoon tomorrow.

## 2014-08-27 NOTE — Progress Notes (Signed)
  CARE MANAGEMENT ED NOTE 08/27/2014  Patient:  Ryan [name],Ryan [name]   Account Number:  [id number]  Date Initiated:  08/27/2014  Documentation initiated by:  Radford PaxFERRERO,Gehrig Patras  Subjective/Objective Assessment:   Patient presents to Ed with increased left foot swelling with ulceration, drainage and foul odor for a couple of days.     Subjective/Objective Assessment Detail:   Patient with pmhx of uncontrolled diabetes, PVD, hypertension, left and right great toe amputation, left fifth toe amputation, neuropathy and PVD. Patient noted to have 4 admission within the last 6 months.     Action/Plan:   Orthopedic consult   Action/Plan Detail:   Anticipated DC Date:       Status Recommendation to Physician:   Result of Recommendation:    Other ED Services  Consult Working Plan    DC Planning Services  Other    Choice offered to / List presented to:            Status of service:  Completed, signed off  ED Comments:   ED Comments Detail:  EDCM spoke to patient at bedside.  Patient confirms he does nto have insurnace.  [facility]yman HopesJegede.  "I'm have to make an appointment for my 3 month Hmg A1C check." EDCM asked patient if he has the orange card?  Patient repolied, "No I have the other card. I get a 100 percent discount."  Patient confirms he is active with the [facility] charity program but believes it has expired in December.  Patient reports he has a patient assistance program for his Levimir insulin that is supposed to last for a year but, "It doesn't last a year.  I hope I still have it."  Patient confrims he has a glucometer at home and only checks his blood sugar once a day.  EDCM stongly suggested patient take his blood sugars with meals and at bedtime.  Patient responded, "Well I started out checking them more a day but now I can tell when my sugar is low so I don't take it as much."  Patient reports he takes Levimir insulin 10 units in the am.  Patient  with diagnosis of osteomylitis of left foot, to have surgery. EDCM will recomend diabetic counselor consult and financial counselor consult.  No further EDCM needs at this time.

## 2014-08-27 NOTE — ED Notes (Signed)
Patient walks well on his own.... Walked to the bathroom and back

## 2014-08-27 NOTE — ED Notes (Signed)
Floor called spoke to [place]...klj

## 2014-08-27 NOTE — H&P (Signed)
Ryan [name] is an 54 y.o. male.    Chief Complaint: left foot drainage, swelling, odor  HPI: 54 y/o male well known to the practice presents with several day history of worsening left lateral foot swelling, drainage and odor. Pt has had 1st and 5th toes amputated in the last 4 months to the left foot. He has been on antibiotics both IV and oral at least 5 times over the past 3 months. C/o worsening appearance to 4th toe that began today with swelling and purplish appearance. Denies any falls or injuries. Denies any fevers or chills  PCP:  Angelica Chessman, MD  PMH: Past Medical History  Diagnosis Date  . Hypertension   . Osteomyelitis of foot   . Heart murmur     was told "not to worry about it"  . Peripheral vascular disease     "poor circulation" in feet  . Diabetes mellitus without complication     type 2  . Diabetic neuropathy   . Arthritis   . Anemia     low iron    PSH: Past Surgical History  Procedure Laterality Date  . Amputation Right 01/13/2014    Procedure: Right great toe amputation;  Surgeon: Tobi Bastos, MD;  Location: [facility];  Service: Orthopedics;  Laterality: Right;  . Knee surgery Right   . Arm surgery Left     due to broken arm  . Amputation Left 06/08/2014    Procedure: AMPUTATION LEFT GREAT TOE;  Surgeon: Augustin Schooling, MD;  Location: [facility];  Service: Orthopedics;  Laterality: Left;  . Amputation Left 07/01/2014    Procedure: LEFT First Metatarsal RAY AMPUTATION ;  Surgeon: Augustin Schooling, MD;  Location: [place];  Service: Orthopedics;  Laterality: Left;    Social History:  reports that he has never smoked. His smokeless tobacco use includes Snuff. He reports that he drinks alcohol. He reports that he does not use illicit drugs.  Allergies:  Allergies  Allergen Reactions  . Metformin And Related Other (See Comments)    Pt states that metformin made sugars fall into 30's    Medications: Current Facility-Administered Medications    Medication Dose Route Frequency Provider Last Rate Last Dose  . hydrALAZINE (APRESOLINE) injection 10 mg  10 mg Intravenous 3 times per day Robbie Lis, MD      . hydrALAZINE (APRESOLINE) injection 10 mg  10 mg Intravenous Q6H PRN Robbie Lis, MD      . insulin aspart (novoLOG) injection 0-20 Units  0-20 Units Subcutaneous TID WC Robbie Lis, MD      . insulin aspart (novoLOG) injection 0-5 Units  0-5 Units Subcutaneous QHS Robbie Lis, MD      . metoprolol (LOPRESSOR) injection 2.5 mg  2.5 mg Intravenous 4 times per day Robbie Lis, MD      . metoprolol (LOPRESSOR) injection 5 mg  5 mg Intravenous Once Johnna Acosta, MD      . piperacillin-tazobactam (ZOSYN) IVPB 3.375 g  3.375 g Intravenous Once Johnna Acosta, MD       Current Outpatient Prescriptions  Medication Sig Dispense Refill  . insulin detemir (LEVEMIR) 100 UNIT/ML injection Inject 0.1 mLs (10 Units total) into the skin at bedtime. (Patient taking differently: Inject 10 Units into the skin daily before breakfast. ) 4 vial 3  . lisinopril (PRINIVIL,ZESTRIL) 20 MG tablet Take 1 tablet (20 mg total) by mouth daily. 90 tablet 3  . doxycycline (VIBRAMYCIN) 50  MG capsule Take 2 capsules (100 mg total) by mouth 2 (two) times daily. (Patient not taking: Reported on 08/27/2014) 20 capsule 0  . ferrous sulfate 325 (65 FE) MG tablet Take 1 tablet (325 mg total) by mouth 3 (three) times daily after meals. (Patient not taking: Reported on 07/01/2014) 90 tablet 1  . oxyCODONE (OXY IR/ROXICODONE) 5 MG immediate release tablet Take 1 tablet (5 mg total) by mouth every 4 (four) hours as needed for moderate pain. (Patient not taking: Reported on 07/21/2014) 10 tablet 0  . oxyCODONE-acetaminophen (ROXICET) 5-325 MG per tablet Take 1-2 tablets by mouth every 4 (four) hours as needed for severe pain. (Patient not taking: Reported on 08/27/2014) 30 tablet 0    Results for orders placed or performed during the hospital encounter of 08/27/14 (from  the past 48 hour(s))  Sedimentation rate     Status: Abnormal   Collection Time: 08/27/14  2:54 PM  Result Value Ref Range   Sed Rate 92 (H) 0 - 16 mm/hr  CBC     Status: Abnormal   Collection Time: 08/27/14  2:54 PM  Result Value Ref Range   WBC 16.6 (H) 4.0 - 10.5 K/uL   RBC 4.18 (L) 4.22 - 5.81 MIL/uL   Hemoglobin 12.6 (L) 13.0 - 17.0 g/dL   HCT 37.4 (L) 39.0 - 52.0 %   MCV 89.5 78.0 - 100.0 fL   MCH 30.1 26.0 - 34.0 pg   MCHC 33.7 30.0 - 36.0 g/dL   RDW 13.2 11.5 - 15.5 %   Platelets 266 150 - 400 K/uL  Basic metabolic panel     Status: Abnormal   Collection Time: 08/27/14  2:54 PM  Result Value Ref Range   Sodium 133 (L) 135 - 145 mmol/L   Potassium 4.1 3.5 - 5.1 mmol/L   Chloride 97 96 - 112 mmol/L   CO2 27 19 - 32 mmol/L   Glucose, Bld 219 (H) 70 - 99 mg/dL   BUN 12 6 - 23 mg/dL   Creatinine, Ser 0.91 0.50 - 1.35 mg/dL   Calcium 9.1 8.4 - 10.5 mg/dL   GFR calc non Af Amer >90 >90 mL/min   GFR calc Af Amer >90 >90 mL/min    Comment: (NOTE) The eGFR has been calculated using the CKD EPI equation. This calculation has not been validated in all clinical situations. eGFR's persistently <90 mL/min signify possible Chronic Kidney Disease.    Anion gap 9 5 - 15   Dg Foot Complete Left  08/27/2014   CLINICAL DATA:  Discolored fifth toe.  EXAM: LEFT FOOT - COMPLETE 3+ VIEW  COMPARISON:  January 11, 2014.  FINDINGS: Status post surgical amputation of the distal first metacarpal and more distal phalanges. Moderately displaced pathologic fracture is seen involving the distal fifth metatarsal with overlying soft tissue defect consistent with osteomyelitis.  IMPRESSION: Acute osteomyelitis of distal fifth metatarsal with overlying soft tissue wound.   Electronically Signed   By: Sabino Dick M.D.   On: 08/27/2014 15:28    ROS: ROS  Left foot previous amputation of 1st and 5th toes Noted drainage and odor this morning  Physical Exam: Alert and appropriate 54 y/o male in no acute  distress Left foot with healing 1st toe amputation wound bed Lateral left foot with 4x5 cm open draining ulcer with some maceration Drainage from left foot with odor 4th toe with purplish hue and increased swelling  Mild erythema extending down the mid foot Full rom of left  ankle Physical Exam   Assessment/Plan Assessment:  Left lateral foot gangrene with 4th toe necrosis  Plan: Admit for surgical management of left foot with IV antibiotics to follow WBC 16.6 currently I have discussed the case with Dr. Rolena Infante who will f/u for probable surgery later today Keep NPO Pain control as needed   Patient seen - agree with above No evidence of sepis  No urgency for surgery Patient with complex history - poorly controlled diabetic with previous toe amputations (bilateral) This new wound is concerning given location. Also issue of blood supply - poor DP/PT pulses Plan: splint and dry dressings NPO after midnight tonight May need transmet. Amputation. Will discuss with my partner to determine best treatment plan

## 2014-08-27 NOTE — ED Notes (Signed)
Floor nurse called and notified of pt arrival in 20 mins and spoke to [email or web address]

## 2014-08-27 NOTE — Consult Note (Signed)
[facility] Medical Consultation  VITO BEG ZOX:[id number] DOB: [birth year] DOA: 08/27/2014 PCP: Jeanann Lewandowsky, MD   Requesting physician: Dr. Venita Lick, Orthopedic surgery Date of consultation: 08/27/2014  Reason for consultation: htn, dm  Impression/Recommendations Principal Problem:   Gangrene of foot Active Problems:   Diabetic foot   Osteomyelitis   Leukocytosis   Diabetes mellitus type 2 with complications   Essential hypertension   Foot osteomyelitis, left    HPI:  54 year old male with past medical history of uncontrolled diabetes, neuropathy and PVD, hypertension, left and right great toe amputation, left 5th toe amputated,  who presented to [facility] ED with worsening left foot swelling, ulceration with drainage and foul odor for past couple of days prior to this admission with associated subjective fevers at home. He has been on antibiotics on outpatient basis and IV abx couple of times since the recent history of amputations. No falls. No other complaints of chest pain, shortness of breath, palpitations. No abdominal pain, nausea or vomiting. No complaints of blurry vision. No headaches.   In ED, BP was 211/98 and has improved to 188/77, HR 112, T max 100 F and oxygen saturation 100% on room air. Blood work showed WBC count 16.6, hemoglobin 12.6 and sodium of 133, normal renal function. CBG was 219. He was started on vanco and zosyn. Orthopedic surgery requested [facility] consult for management of medical comorbidites.   Assessment & Plan    Principal Problem: Gangrene of foot, distal left metatarsal / Osteomyelitis / Diabetic foot / Leukocytosis - pt with poorly controlled diabetes, x ray shows acute osteomyelitis of fifth metatarsal  - pt started on vanco and zosyn, we will continue this antibiotic regimen - management per orthopedic surgery  - PT evaluation once pt able to participate, order placed - keep NPO for possible surgery - started IV fluids for  hydration - continue pain management efforts   Active Problems: Diabetes mellitus type 2 with complications - check A1c; DM with manifestation of foot gangrene - resume home insulin dose and add SSI TID with meals and HS coverage - appreciate DM coordinator input  Essential hypertension  - since pt NPO start metoprolol 2.5 mg IV every 6 hours - added hydralazine PRN for BP control if BP still above 140/90  DVT prophylaxis:  - SCD's bilaterally due to anticipated surgery    I will followup again tomorrow. Please contact me if I can be of assistance in the meanwhile. Thank you for this consultation.  Manson Passey [facility] [phone]   Review of Systems:  Constitutional: Negative for fever, chills, diaphoresis, activity change, appetite change and fatigue.  HENT: Negative for ear pain, nosebleeds, congestion, facial swelling, rhinorrhea, neck pain, neck stiffness and ear discharge.   Eyes: Negative for pain, discharge, redness, itching and visual disturbance.  Respiratory: Negative for cough, choking, chest tightness, shortness of breath, wheezing and stridor.   Cardiovascular: Negative for chest pain, palpitations and leg swelling.  Gastrointestinal: Negative for abdominal distention.  Genitourinary: Negative for dysuria, urgency, frequency, hematuria, flank pain, decreased urine volume, difficulty urinating and dyspareunia.  Musculoskeletal: Negative for back pain, falls Neurological: Negative for dizziness, tremors, seizures, syncope, facial asymmetry, speech difficulty, weakness, light-headedness, numbness and headaches.  Hematological: Negative for adenopathy. Does not bruise/bleed easily.  Psychiatric/Behavioral: Negative for hallucinations, behavioral problems, confusion, dysphoric mood, decreased concentration and agitation.     Past Medical History  Diagnosis Date  . Hypertension   . Osteomyelitis of foot   . Heart murmur  was told "not to worry about it"   . Peripheral vascular disease     "poor circulation" in feet  . Diabetes mellitus without complication     type 2  . Diabetic neuropathy   . Arthritis   . Anemia     low iron   Past Surgical History  Procedure Laterality Date  . Amputation Right 01/13/2014    Procedure: Right great toe amputation;  Surgeon: Jacki Conesonald A Gioffre, MD;  Location: [facility];  Service: Orthopedics;  Laterality: Right;  . Knee surgery Right   . Arm surgery Left     due to broken arm  . Amputation Left 06/08/2014    Procedure: AMPUTATION LEFT GREAT TOE;  Surgeon: Verlee RossettiSteven R Norris, MD;  Location: [facility];  Service: Orthopedics;  Laterality: Left;  . Amputation Left 07/01/2014    Procedure: LEFT First Metatarsal RAY AMPUTATION ;  Surgeon: Verlee RossettiSteven R Norris, MD;  [facility];  Service: Orthopedics;  Laterality: Left;   Social History:  reports that he has never smoked. His smokeless tobacco use includes Snuff. He reports that he drinks alcohol. He reports that he does not use illicit drugs.  Allergies  Allergen Reactions  . Metformin And Related Other (See Comments)    Pt states that metformin made sugars fall into 30's   Family History  Problem Relation Age of Onset  . Family history: HTN and DM in family     Prior to Admission medications   Medication Sig Start Date End Date Taking? Authorizing Provider  insulin detemir (LEVEMIR) 100 UNIT/ML injection Inject 0.1 mLs (10 Units total) into the skin at bedtime. Patient taking differently: Inject 10 Units into the skin daily before breakfast.  01/20/14  Yes Olugbemiga E Hyman HopesJegede, MD  lisinopril (PRINIVIL,ZESTRIL) 20 MG tablet Take 1 tablet (20 mg total) by mouth daily. 06/23/14  Yes Quentin Angstlugbemiga E Jegede, MD  doxycycline (VIBRAMYCIN) 50 MG capsule Take 2 capsules (100 mg total) by mouth 2 (two) times daily. Patient not taking: Reported on 08/27/2014 07/10/14   Thea Gisthomas B Dixon, PA-C  ferrous sulfate 325 (65 FE) MG tablet Take 1 tablet (325 mg total) by mouth 3 (three)  times daily after meals. Patient not taking: Reported on 07/01/2014 01/15/14   Leatha Gildingostin M Gherghe, MD  oxyCODONE (OXY IR/ROXICODONE) 5 MG immediate release tablet Take 1 tablet (5 mg total) by mouth every 4 (four) hours as needed for moderate pain. Patient not taking: Reported on 07/21/2014 06/11/14   Kathlen ModyVijaya Akula, MD  oxyCODONE-acetaminophen (ROXICET) 5-325 MG per tablet Take 1-2 tablets by mouth every 4 (four) hours as needed for severe pain. Patient not taking: Reported on 08/27/2014 07/02/14   Verlee RossettiSteven R Norris, MD   Physical Exam: Blood pressure 113/62, pulse 95, temperature 100 F (37.8 C), temperature source Oral, resp. rate 18, SpO2 99 %. Filed Vitals:   08/27/14 1646  BP: 113/62  Pulse: 95  Temp:   Resp: 18    Physical Exam  Constitutional: Appears well-developed and well-nourished. No distress.  HENT: Normocephalic. External right and left ear normal. Oropharynx is clear and moist.  Eyes: Conjunctivae and EOM are normal. PERRLA, no scleral icterus.  Neck: Normal ROM. Neck supple. No JVD. No tracheal deviation. No thyromegaly.  CVS: RRR, S1/S2 appreciated  Pulmonary: Effort and breath sounds normal, no stridor, rhonchi, wheezes, rales.  Abdominal: Soft. BS +,  no distension, tenderness, rebound or guarding.  Musculoskeletal: Normal range of motion. 1st toe amputated, left foot; 4th toe discolored, draining ulcer on  left foot Lymphadenopathy: No lymphadenopathy noted, cervical, inguinal. Neuro: Alert. Normal reflexes, muscle tone coordination. No cranial nerve deficit. Skin: Left foot 1st toe amputated, 4th purplish discoloration, left foot draining ulcer; right toe amputated     Labs on Admission:  Basic Metabolic Panel:  Recent Labs Lab 08/27/14 1454  NA 133*  K 4.1  CL 97  CO2 27  GLUCOSE 219*  BUN 12  CREATININE 0.91  CALCIUM 9.1   Liver Function Tests: No results for input(s): AST, ALT, ALKPHOS, BILITOT, PROT, ALBUMIN in the last 168 hours. No results for  input(s): LIPASE, AMYLASE in the last 168 hours. No results for input(s): AMMONIA in the last 168 hours. CBC:  Recent Labs Lab 08/27/14 1454  WBC 16.6*  HGB 12.6*  HCT 37.4*  MCV 89.5  PLT 266   Cardiac Enzymes: No results for input(s): CKTOTAL, CKMB, CKMBINDEX, TROPONINI in the last 168 hours. BNP: Invalid input(s): POCBNP CBG:  Recent Labs Lab 08/27/14 1643  GLUCAP 148*    Radiological Exams on Admission: Dg Foot Complete Left 08/27/2014  Acute osteomyelitis of distal fifth metatarsal with overlying soft tissue wound.   Electronically Signed   By: Roque Lias M.D.   On: 08/27/2014 15:28    Time spent: 60 minutes   Manson Passey [facility] Pager [phone]  If 7PM-7AM, please contact night-coverage [email or web address] Password [facility] 08/27/2014, 5:51 PM

## 2014-08-27 NOTE — ED Provider Notes (Signed)
CSN: [id number]     Arrival date & time 08/27/14  1410 History   First MD Initiated Contact with Patient 08/27/14 1413     Chief Complaint  Patient presents with  . 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 He does not have much pain in his feet secondary to his severe neuropathy. The symptoms are persistent, gradually worsening and are now severe. He has not seen his family doctor or his orthopedist for this.  The history is provided by the patient and medical records.    Past Medical History  Diagnosis Date  . Hypertension   . Osteomyelitis of foot   . Heart murmur     was told "not to worry about it"  . Peripheral vascular disease     "poor circulation" in feet  . Diabetes mellitus without complication     type 2  . Diabetic neuropathy   . Arthritis   . Anemia     low iron   Past Surgical History  Procedure Laterality Date  . Amputation Right 01/13/2014    Procedure: Right great toe amputation;  Surgeon: Jacki Conesonald A Gioffre, MD;  Location: [facility];  Service: Orthopedics;  Laterality: Right;  . Knee surgery Right   . Arm surgery Left     due to broken arm  . Amputation Left 06/08/2014    Procedure: AMPUTATION LEFT GREAT TOE;  Surgeon: Verlee RossettiSteven R Norris, MD;  Location: [facility];  Service: Orthopedics;  Laterality: Left;  . Amputation Left 07/01/2014    Procedure: LEFT First Metatarsal RAY AMPUTATION ;  Surgeon: Verlee RossettiSteven R Norris, MD;  [facility];  Service: Orthopedics;  Laterality:  Left;   Family History  Problem Relation Age of Onset  . Family history unknown: Yes   History  Substance Use Topics  . Smoking status: Never Smoker   . Smokeless tobacco: Current User    Types: Snuff  . Alcohol Use: Yes     Comment: occasionally    Review of Systems  All other systems reviewed and are negative.     Allergies  Metformin and related  Home Medications   Prior to Admission medications   Medication Sig Start Date End Date Taking? Authorizing Provider  insulin detemir (LEVEMIR) 100 UNIT/ML injection Inject 0.1 mLs (10 Units total) into the skin at bedtime. Patient taking differently: Inject 10 Units into the skin daily before breakfast.  01/20/14  Yes Olugbemiga E Hyman HopesJegede, MD  lisinopril (PRINIVIL,ZESTRIL) 20 MG tablet Take 1 tablet (20 mg total) by mouth daily. 06/23/14  Yes Quentin Angstlugbemiga E Jegede, MD  doxycycline (VIBRAMYCIN) 50 MG capsule Take 2 capsules (100 mg total) by mouth 2 (two) times daily. Patient not taking: Reported on 08/27/2014 07/10/14   Thea Gisthomas B Dixon, PA-C  ferrous sulfate 325 (65 FE) MG tablet Take 1 tablet (325 mg total) by mouth 3 (three) times daily after meals. Patient not taking: Reported on 07/01/2014 01/15/14   Leatha Gildingostin M Gherghe, MD  oxyCODONE (OXY IR/ROXICODONE) 5 MG immediate release tablet Take 1 tablet (5  mg total) by mouth every 4 (four) hours as needed for moderate pain. Patient not taking: Reported on 07/21/2014 06/11/14   Kathlen Mody, MD  oxyCODONE-acetaminophen (ROXICET) 5-325 MG per tablet Take 1-2 tablets by mouth every 4 (four) hours as needed for severe pain. Patient not taking: Reported on 08/27/2014 07/02/14   Verlee Rossetti, MD   BP 122/63 mmHg  Pulse 96  Temp(Src) 100.9 F (38.3 C) (Oral)  Resp 18  SpO2 100% Physical Exam  Constitutional: He appears well-developed and well-nourished. No distress.  HENT:  Head: Normocephalic and atraumatic.  Mouth/Throat: Oropharynx is clear and moist. No oropharyngeal exudate.  Eyes:  Conjunctivae and EOM are normal. Pupils are equal, round, and reactive to light. Right eye exhibits no discharge. Left eye exhibits no discharge. No scleral icterus.  Neck: Normal range of motion. Neck supple. No JVD present. No thyromegaly present.  Cardiovascular: Normal rate, regular rhythm, normal heart sounds and intact distal pulses.  Exam reveals no gallop and no friction rub.   No murmur heard. Pulmonary/Chest: Effort normal and breath sounds normal. No respiratory distress. He has no wheezes. He has no rales.  Abdominal: Soft. Bowel sounds are normal. He exhibits no distension and no mass. There is no tenderness.  Musculoskeletal: Normal range of motion. He exhibits edema ( Pitting edema of the left foot and descending proximal to the left ankle). He exhibits no tenderness.  Lymphadenopathy:    He has no cervical adenopathy.  Neurological: He is alert. Coordination normal.  Skin: Skin is warm and dry. Rash noted. There is erythema.  Foul-smelling wound to the left lateral foot, left great toe wound bed appears to be healing well, palpable metacarpal at the base of the wound on the left foot lateral surface  Psychiatric: He has a normal mood and affect. His behavior is normal.  Nursing note and vitals reviewed.   ED Course  Procedures (including critical care time) Labs Review Labs Reviewed  SEDIMENTATION RATE - Abnormal; Notable for the following:    Sed Rate 92 (*)    All other components within normal limits  CBC - Abnormal; Notable for the following:    WBC 16.6 (*)    RBC 4.18 (*)    Hemoglobin 12.6 (*)    HCT 37.4 (*)    All other components within normal limits  BASIC METABOLIC PANEL - Abnormal; Notable for the following:    Sodium 133 (*)    Glucose, Bld 219 (*)    All other components within normal limits  CBG MONITORING, ED - Abnormal; Notable for the following:    Glucose-Capillary 148 (*)    All other components within normal limits  TSH  HEMOGLOBIN A1C    COMPREHENSIVE METABOLIC PANEL  CBC    Imaging Review Dg Foot Complete Left  08/27/2014   CLINICAL DATA:  Discolored fifth toe.  EXAM: LEFT FOOT - COMPLETE 3+ VIEW  COMPARISON:  January 11, 2014.  FINDINGS: Status post surgical amputation of the distal first metacarpal and more distal phalanges. Moderately displaced pathologic fracture is seen involving the distal fifth metatarsal with overlying soft tissue defect consistent with osteomyelitis.  IMPRESSION: Acute osteomyelitis of distal fifth metatarsal with overlying soft tissue wound.   Electronically Signed   By: Roque Lias M.D.   On: 08/27/2014 15:28     EKG Interpretation None           MDM   Final diagnoses:  Wound abscess  Osteomyelitis    The patient has  ascending erythema consistent with a soft tissue infection, I suspect given the wound and the discoloration of his fifth toe that he likely has osteomyelitis, he will need antibiotics, he has a fever and a tachycardia, he is severely hypertensive. He has no longer on antibiotics, he finished a course of doxycycline postop. I will start medical myosin, obtain laboratory studies and an x-ray, anticipate MRI and admission if no obvious answer.  D/w Dr. Shon Baton - they will provide consultation - request NPO status.  Will d/w IM service to admit.  Abx ordered  D/w Dr. Elisabeth Pigeon who will admit  Meds given in ED:  Medications  insulin detemir (LEVEMIR) injection 10 Units (not administered)  sodium chloride 0.9 % injection 3 mL (not administered)  acetaminophen (TYLENOL) tablet 650 mg (not administered)    Or  acetaminophen (TYLENOL) suppository 650 mg (not administered)  HYDROmorphone (DILAUDID) injection 1 mg (not administered)  ondansetron (ZOFRAN) tablet 4 mg (not administered)    Or  ondansetron (ZOFRAN) injection 4 mg (not administered)  hydrALAZINE (APRESOLINE) injection 10 mg (not administered)  metoprolol (LOPRESSOR) injection 5 mg (0 mg Intravenous Hold 08/27/14  1630)  metoprolol (LOPRESSOR) injection 2.5 mg (2.5 mg Intravenous Not Given 08/27/14 2113)  insulin aspart (novoLOG) injection 0-20 Units (not administered)  insulin aspart (novoLOG) injection 0-5 Units (not administered)  oxyCODONE (Oxy IR/ROXICODONE) immediate release tablet 5 mg (not administered)  ondansetron (ZOFRAN) injection 4 mg (not administered)  docusate sodium (COLACE) capsule 100 mg (not administered)  vancomycin (VANCOCIN) 1,500 mg in sodium chloride 0.9 % 500 mL IVPB (not administered)  piperacillin-tazobactam (ZOSYN) IVPB 3.375 g (not administered)  vancomycin (VANCOCIN) IVPB 1000 mg/200 mL premix (0 mg Intravenous Stopped 08/27/14 1655)  piperacillin-tazobactam (ZOSYN) IVPB 3.375 g (3.375 g Intravenous New Bag/Given 08/27/14 1646)    Current Discharge Medication List        Vida Roller, MD 08/27/14 2138

## 2014-08-27 NOTE — ED Notes (Signed)
Pt states that he has noticed odor and discoloration of little toe on left foot. Pt has PMH gangrene in other toe and had to have removed.

## 2014-08-27 NOTE — ED Notes (Signed)
Patient presents with dark discoloration and malodorous odor from small toe on left foot. Digit is dark and bluish in color.  Pulses palpated, skin warm and dry.

## 2014-08-27 NOTE — Progress Notes (Signed)
ANTIBIOTIC CONSULT NOTE - INITIAL  Pharmacy Consult for Vancomycin & Zosyn Indication: L fifth toe/osteomyelitis  Allergies  Allergen Reactions  . Metformin And Related Other (See Comments)    Pt states that metformin made sugars fall into 30's   Patient Measurements:   Total body weight: current weight requested from ED (76 kg 07/21/14)  Vital Signs: Temp: 100 F (37.8 C) (01/28 1418) Temp Source: Oral (01/28 1418) BP: 188/77 mmHg (01/28 1621) Pulse Rate: 112 (01/28 1621) Intake/Output from previous day:   Intake/Output from this shift:    Labs:  Recent Labs  08/27/14 1454  WBC 16.6*  HGB 12.6*  PLT 266  CREATININE 0.91   CrCl cannot be calculated (Unknown ideal weight.). No results for input(s): VANCOTROUGH, VANCOPEAK, VANCORANDOM, GENTTROUGH, GENTPEAK, GENTRANDOM, TOBRATROUGH, TOBRAPEAK, TOBRARND, AMIKACINPEAK, AMIKACINTROU, AMIKACIN in the last 72 hours.   Microbiology: No results found for this or any previous visit (from the past 720 hour(s)).  Medical History: Past Medical History  Diagnosis Date  . Hypertension   . Osteomyelitis of foot   . Heart murmur     was told "not to worry about it"  . Peripheral vascular disease     "poor circulation" in feet  . Diabetes mellitus without complication     type 2  . Diabetic neuropathy   . Arthritis   . Anemia     low iron   Medications:  Scheduled:  . docusate sodium  100 mg Oral BID  . hydrALAZINE  5 mg Intravenous 3 times per day  . insulin aspart  0-20 Units Subcutaneous TID WC  . insulin aspart  0-5 Units Subcutaneous QHS  . [START ON 08/28/2014] insulin detemir  10 Units Subcutaneous QAC breakfast  . metoprolol  2.5 mg Intravenous 4 times per day  . metoprolol  5 mg Intravenous Once  . sodium chloride  3 mL Intravenous Q12H   Anti-infectives    Start     Dose/Rate Route Frequency Ordered Stop   08/27/14 1600  piperacillin-tazobactam (ZOSYN) IVPB 3.375 g     3.375 g12.5 mL/hr over 240 Minutes  Intravenous  Once 08/27/14 1546     08/27/14 1500  vancomycin (VANCOCIN) IVPB 1000 mg/200 mL premix     1,000 mg200 mL/hr over 60 Minutes Intravenous  Once 08/27/14 1447 08/27/14 1655     Assessment: Ryan [name] with L fifth toe osteomyelitis. Hx of poorly controlled DM, previous toe amputation. Vancomycin and Zosyn per pharmacy.  Goal of Therapy:  Vancomycin trough level 15-20 mcg/ml  Plan:   Vancomycin 1000mg  in ED, follow with 1500mg  q12  Zosyn 3.375gm q8- 4 hr infusion  Otho BellowsGreen, Mabrey Howland L PharmD Pager [phone] 08/27/2014, 7:00 PM

## 2014-08-28 ENCOUNTER — Inpatient Hospital Stay (HOSPITAL_COMMUNITY): Payer: Medicaid Other | Admitting: Certified Registered"

## 2014-08-28 ENCOUNTER — Encounter (HOSPITAL_COMMUNITY)
Admission: EM | Disposition: A | Discharge status: Home / Self Care | Payer: Self-pay | Source: Home / Self Care | Attending: Orthopaedic Surgery

## 2014-08-28 ENCOUNTER — Encounter (HOSPITAL_COMMUNITY): Payer: Self-pay | Admitting: Certified Registered"

## 2014-08-28 DIAGNOSIS — I96 Gangrene, not elsewhere classified: Secondary | ICD-10-CM

## 2014-08-28 DIAGNOSIS — E1152 Type 2 diabetes mellitus with diabetic peripheral angiopathy with gangrene: Secondary | ICD-10-CM

## 2014-08-28 HISTORY — PX: AMPUTATION: SHX166

## 2014-08-28 LAB — COMPREHENSIVE METABOLIC PANEL
ALBUMIN: 2.9 g/dL — AB (ref 3.5–5.2)
ALK PHOS: 85 U/L (ref 39–117)
ALT: 16 U/L (ref 0–53)
AST: 14 U/L (ref 0–37)
Anion gap: 6 (ref 5–15)
BILIRUBIN TOTAL: 0.9 mg/dL (ref 0.3–1.2)
BUN: 14 mg/dL (ref 6–23)
CO2: 28 mmol/L (ref 19–32)
Calcium: 8.4 mg/dL (ref 8.4–10.5)
Chloride: 101 mmol/L (ref 96–112)
Creatinine, Ser: 0.92 mg/dL (ref 0.50–1.35)
GFR calc Af Amer: >90 mL/min (ref >90)
GFR calc non Af Amer: >90 mL/min (ref >90)
Glucose, Bld: 129 mg/dL — ABNORMAL HIGH (ref 70–99)
Potassium: 3.7 mmol/L (ref 3.5–5.1)
Sodium: 135 mmol/L (ref 135–145)
Total Protein: 7 g/dL (ref 6.0–8.3)

## 2014-08-28 LAB — CBC
HCT: 31.8 % — ABNORMAL LOW (ref 39.0–52.0)
HEMOGLOBIN: 10.6 g/dL — AB (ref 13.0–17.0)
MCH: 30.2 pg (ref 26.0–34.0)
MCHC: 33.3 g/dL (ref 30.0–36.0)
MCV: 90.6 fL (ref 78.0–100.0)
Platelets: 235 10*3/uL (ref 150–400)
RBC: 3.51 MIL/uL — ABNORMAL LOW (ref 4.22–5.81)
RDW: 13.4 % (ref 11.5–15.5)
WBC: 11.5 10*3/uL — ABNORMAL HIGH (ref 4.0–10.5)

## 2014-08-28 LAB — GLUCOSE, CAPILLARY
GLUCOSE-CAPILLARY: 97 mg/dL (ref 70–99)
Glucose-Capillary: 121 mg/dL — ABNORMAL HIGH (ref 70–99)
Glucose-Capillary: 139 mg/dL — ABNORMAL HIGH (ref 70–99)
Glucose-Capillary: 191 mg/dL — ABNORMAL HIGH (ref 70–99)

## 2014-08-28 LAB — SURGICAL PCR SCREEN
MRSA, PCR: NEGATIVE
Staphylococcus aureus: NEGATIVE

## 2014-08-28 SURGERY — AMPUTATION, FOOT, RAY
Anesthesia: General | Site: Foot | Laterality: Left

## 2014-08-28 MED ORDER — PROPOFOL 10 MG/ML IV BOLUS
INTRAVENOUS | Status: AC
  Filled 2014-08-28: qty 20

## 2014-08-28 MED ORDER — ONDANSETRON HCL 4 MG/2ML IJ SOLN
INTRAMUSCULAR | Status: AC
  Filled 2014-08-28: qty 2

## 2014-08-28 MED ORDER — ONDANSETRON HCL 4 MG/2ML IJ SOLN
INTRAMUSCULAR | Status: DC | PRN
  Administered 2014-08-28: 4 mg via INTRAVENOUS

## 2014-08-28 MED ORDER — LIDOCAINE HCL (CARDIAC) 20 MG/ML IV SOLN
INTRAVENOUS | Status: AC
  Filled 2014-08-28: qty 5

## 2014-08-28 MED ORDER — HYDROMORPHONE HCL 1 MG/ML IJ SOLN: 0.2500 mg | INTRAMUSCULAR | Status: DC | PRN

## 2014-08-28 MED ORDER — LIDOCAINE HCL (CARDIAC) 20 MG/ML IV SOLN
INTRAVENOUS | Status: DC | PRN
  Administered 2014-08-28: 50 mg via INTRAVENOUS

## 2014-08-28 MED ORDER — 0.9 % SODIUM CHLORIDE (POUR BTL) OPTIME
TOPICAL | Status: DC | PRN
  Administered 2014-08-28: 1000 mL

## 2014-08-28 MED ORDER — EPHEDRINE SULFATE 50 MG/ML IJ SOLN
INTRAMUSCULAR | Status: DC | PRN
  Administered 2014-08-28 (×2): 10 mg via INTRAVENOUS

## 2014-08-28 MED ORDER — PROPOFOL 10 MG/ML IV BOLUS
INTRAVENOUS | Status: DC | PRN
  Administered 2014-08-28: 200 mg via INTRAVENOUS

## 2014-08-28 MED ORDER — MIDAZOLAM HCL 5 MG/5ML IJ SOLN
INTRAMUSCULAR | Status: DC | PRN
  Administered 2014-08-28: 2 mg via INTRAVENOUS

## 2014-08-28 MED ORDER — FENTANYL CITRATE 0.05 MG/ML IJ SOLN
INTRAMUSCULAR | Status: DC | PRN
  Administered 2014-08-28: 100 ug via INTRAVENOUS

## 2014-08-28 MED ORDER — MIDAZOLAM HCL 2 MG/2ML IJ SOLN
INTRAMUSCULAR | Status: AC
  Filled 2014-08-28: qty 2

## 2014-08-28 MED ORDER — LACTATED RINGERS IV SOLN
INTRAVENOUS | Status: DC
  Administered 2014-08-28: 1000 mL via INTRAVENOUS
  Administered 2014-08-28: 18:00:00 via INTRAVENOUS

## 2014-08-28 MED ORDER — FENTANYL CITRATE 0.05 MG/ML IJ SOLN
INTRAMUSCULAR | Status: AC
Start: 2014-08-28 — End: 2014-08-28
  Filled 2014-08-28: qty 2

## 2014-08-28 MED ORDER — VANCOMYCIN HCL IN DEXTROSE 750-5 MG/150ML-% IV SOLN
750.0000 mg | Freq: Three times a day (TID) | INTRAVENOUS | Status: DC
  Administered 2014-08-28 – 2014-08-30 (×7): 750 mg via INTRAVENOUS
  Filled 2014-08-28 (×8): qty 150

## 2014-08-28 MED ORDER — HYDRALAZINE HCL 25 MG PO TABS
25.0000 mg | ORAL_TABLET | Freq: Once | ORAL | Status: AC
  Administered 2014-08-29: 25 mg via ORAL
  Filled 2014-08-28: qty 1

## 2014-08-28 MED ORDER — LACTATED RINGERS IV SOLN
INTRAVENOUS | Status: DC
  Administered 2014-08-29 (×3): via INTRAVENOUS

## 2014-08-28 SURGICAL SUPPLY — 39 items
BAG SPEC THK2 15X12 ZIP CLS (MISCELLANEOUS) ×1
BAG ZIPLOCK 12X15 (MISCELLANEOUS) ×3 IMPLANT
BANDAGE ESMARK 6X9 LF (GAUZE/BANDAGES/DRESSINGS) ×1 IMPLANT
BLADE 10 SAFETY STRL DISP (BLADE) ×4 IMPLANT
BLADE OSCILLATING/SAGITTAL (BLADE) ×3
BLADE SW THK.38XMED LNG THN (BLADE) IMPLANT
BNDG CMPR 9X6 STRL LF SNTH (GAUZE/BANDAGES/DRESSINGS) ×1
BNDG COHESIVE 4X5 TAN STRL (GAUZE/BANDAGES/DRESSINGS) ×2 IMPLANT
BNDG ESMARK 6X9 LF (GAUZE/BANDAGES/DRESSINGS) ×3
BNDG GAUZE ELAST 4 BULKY (GAUZE/BANDAGES/DRESSINGS) ×2 IMPLANT
CONT SPEC 4OZ CLIKSEAL STRL BL (MISCELLANEOUS) ×2 IMPLANT
CUFF TOURN SGL QUICK 34 (TOURNIQUET CUFF)
CUFF TRNQT CYL 34X4X40X1 (TOURNIQUET CUFF) ×1 IMPLANT
DRAPE U-SHAPE 47X51 STRL (DRAPES) ×3 IMPLANT
DURAPREP 26ML APPLICATOR (WOUND CARE) ×3 IMPLANT
ELECT REM PT RETURN 9FT ADLT (ELECTROSURGICAL) ×3
ELECTRODE REM PT RTRN 9FT ADLT (ELECTROSURGICAL) ×1 IMPLANT
GAUZE SPONGE 4X4 12PLY STRL (GAUZE/BANDAGES/DRESSINGS) ×3 IMPLANT
GAUZE XEROFORM 1X8 LF (GAUZE/BANDAGES/DRESSINGS) ×2 IMPLANT
GAUZE XEROFORM 5X9 LF (GAUZE/BANDAGES/DRESSINGS) ×3 IMPLANT
GLOVE BIOGEL PI IND STRL 8 (GLOVE) ×1 IMPLANT
GLOVE BIOGEL PI INDICATOR 8 (GLOVE) ×2
GLOVE ECLIPSE 8.0 STRL XLNG CF (GLOVE) ×3 IMPLANT
GLOVE ORTHO TXT STRL SZ7.5 (GLOVE) ×3 IMPLANT
GOWN STRL REUS W/TWL XL LVL3 (GOWN DISPOSABLE) ×3 IMPLANT
KIT BASIN OR (CUSTOM PROCEDURE TRAY) ×3 IMPLANT
NS IRRIG 1000ML POUR BTL (IV SOLUTION) ×3 IMPLANT
PACK ORTHO EXTREMITY (CUSTOM PROCEDURE TRAY) ×3 IMPLANT
PAD CAST 4YDX4 CTTN HI CHSV (CAST SUPPLIES) ×1 IMPLANT
PADDING CAST ABS 4INX4YD NS (CAST SUPPLIES) ×2
PADDING CAST ABS COTTON 4X4 ST (CAST SUPPLIES) IMPLANT
PADDING CAST COTTON 4X4 STRL (CAST SUPPLIES)
POSITIONER SURGICAL ARM (MISCELLANEOUS) ×3 IMPLANT
SPONGE LAP 18X18 X RAY DECT (DISPOSABLE) ×2 IMPLANT
STAPLER VISISTAT 35W (STAPLE) ×1 IMPLANT
STOCKINETTE 8 INCH (MISCELLANEOUS) ×3 IMPLANT
SUT ETHILON 2 0 PSLX (SUTURE) ×6 IMPLANT
TOWEL OR 17X26 10 PK STRL BLUE (TOWEL DISPOSABLE) ×5 IMPLANT
WATER STERILE IRR 1500ML POUR (IV SOLUTION) ×1 IMPLANT

## 2014-08-28 NOTE — Progress Notes (Signed)
Pt wanted to ambulated to bathroom at 2130 however was advised by staff that he should not be ambulating on the foot that he just had surgery with. Pt was given opinions of bsc/urinal however became agitation with staff, verbally aggressive and pulled out IV. Pt made statements that, "he never should have come here" that he "just should have gone somewhere to die". Staff RN and NT were unable to reason with patient and so [facility] and Security were both called for support. At that time charge RN on floor assisted pt to bsc and once the off duty officer arrived at beside, pt insisted that all females leave the room and only male officer remained with pt. Officer was able to help patient calm down enough that this RN was able to return to room and assist pt back into the bed however pt remained aggressive and disrespectful towards this RN and so another RN will be accepting his care for the remainder of the shift. Prior to giving report to newly assigned RN, IV site that pt removed was dressed and both oncall MDs from the orthopedic and hospitalist teams were contacted about pt condition/situation. Orders were received. Pt is currently resting in room, awaiting visit from a Chaplin per his request.

## 2014-08-28 NOTE — Progress Notes (Signed)
ANTIBIOTIC CONSULT NOTE - FOLLOW UP  Pharmacy Consult for Vancomycin, Zosyn Indication: Osteomyelitis  Allergies  Allergen Reactions  . Metformin And Related Other (See Comments)    Pt states that metformin made sugars fall into 30's    Patient Measurements: Height: 5\' 11"  (180.3 cm) Weight: 171 lb 8 oz (77.792 kg) IBW/kg (Calculated) : 75.3  Vital Signs: Temp: 98.6 F (37 C) (01/29 0511) Temp Source: Oral (01/29 0511) BP: 117/69 mmHg (01/29 0511) Pulse Rate: 76 (01/29 0511) Intake/Output from previous day: 01/28 0701 - 01/29 0700 In: 50 [IV Piggyback:50] Out: -   Labs:  Recent Labs  08/27/14 1454 08/28/14 0535  WBC 16.6* 11.5*  HGB 12.6* 10.6*  PLT 266 235  CREATININE 0.91 0.92   Estimated Creatinine Clearance: 101.2 mL/min (by C-G formula based on Cr of 0.92). No results for input(s): VANCOTROUGH, VANCOPEAK, VANCORANDOM, GENTTROUGH, GENTPEAK, GENTRANDOM, TOBRATROUGH, TOBRAPEAK, TOBRARND, AMIKACINPEAK, AMIKACINTROU, AMIKACIN in the last 72 hours.    Assessment: [age] yoM admitted 1/28 with L fifth toe osteomyelitis. PMH includes poorly controlled DM, previous bilateral great toe amputations.  He is scheduled to have ampuation in OR on 1/29.  Pharmacy is consulted to dose Vancomycin and Zosyn.  1/28 >> Vanc  >> 1/28 >> Zosyn  >>    Today, 08/28/2014:  Tmax: 101.9  WBCs: improved, 11.5  Renal: SCr 0.92, CrCl ~ 100 CG (92 N)  Blood cultures pending   Goal of Therapy:  Vancomycin trough level 15-20 mcg/ml  Plan:   Continue Zosyn 3.375g IV Q8H infused over 4hrs.  Change to Vancomycin 750mg  IV q8h.  Measure Vanc trough at steady state.  Follow up renal fxn and culture results as available.  Lynann Beaverhristine Fransheska Willingham PharmD, BCPS Pager [phone] 08/28/2014 12:49 PM

## 2014-08-28 NOTE — Progress Notes (Signed)
VASCULAR LAB PRELIMINARY  ARTERIAL  ABI completed:    RIGHT    LEFT    PRESSURE WAVEFORM  PRESSURE WAVEFORM  BRACHIAL 127 Triphasic BRACHIAL 108 Triphasic  DP 133 Triphasic DP 87 Biphasic  PT 141 Triphasic PT 91 Triphasic    RIGHT LEFT  ABI 1.11 0.72   ABIs indicate normal arterial flow on the right and a moderate reduction in arterial flow on the left at rest. Doppler waveforms are within normal limits bilaterally at rest.  There is a 19 mmHg gradient difference in the brachial pressures with normal Doppler waveforms bilaterally consistent with a possible early left stenosis .  Ninoshka Wainwright, RVS 08/28/2014, 8:59 AM

## 2014-08-28 NOTE — Anesthesia Preprocedure Evaluation (Addendum)
Anesthesia Evaluation  Patient identified by MRN, date of birth, ID band Patient awake    Reviewed: Allergy & Precautions, H&P , NPO status , Patient's Chart, lab work & pertinent test results  History of Anesthesia Complications Negative for: history of anesthetic complications  Airway Mallampati: II  TM Distance: >3 FB Neck ROM: Full    Dental  (+) Poor Dentition, Dental Advisory Given   Pulmonary    Pulmonary exam normal       Cardiovascular hypertension, Pt. on medications and Pt. on home beta blockers + Peripheral Vascular Disease     Neuro/Psych negative neurological ROS  negative psych ROS   GI/Hepatic negative GI ROS, Neg liver ROS,   Endo/Other  diabetes, Poorly Controlled, Type 2, Insulin Dependent  Renal/GU negative Renal ROS     Musculoskeletal   Abdominal   Peds  Hematology  (+) anemia , hgb 10.6   Anesthesia Other Findings   Reproductive/Obstetrics                            Anesthesia Physical Anesthesia Plan  ASA: III  Anesthesia Plan: General   Post-op Pain Management:    Induction: Intravenous  Airway Management Planned: LMA  Additional Equipment:   Intra-op Plan:   Post-operative Plan:   Informed Consent:   Plan Discussed with: Surgeon  Anesthesia Plan Comments:         Anesthesia Quick Evaluation

## 2014-08-28 NOTE — Anesthesia Postprocedure Evaluation (Signed)
  Anesthesia Post-op Note  Patient: Ryan [name]  Procedure(s) Performed: Procedure(s) (LRB): Left Foot Fifth ray resection (Left)  Patient Location: PACU  Anesthesia Type: General  Level of Consciousness: awake and alert   Airway and Oxygen Therapy: Patient Spontanous Breathing  Post-op Pain: mild  Post-op Assessment: Post-op Vital signs reviewed, Patient's Cardiovascular Status Stable, Respiratory Function Stable, Patent Airway and No signs of Nausea or vomiting  Last Vitals:  Filed Vitals:   08/28/14 1830  BP: 147/71  Pulse: 77  Temp: 36.4 C  Resp: 16    Post-op Vital Signs: stable   Complications: No apparent anesthesia complications

## 2014-08-28 NOTE — Progress Notes (Signed)
Inpatient Diabetes Program Recommendations  AACE/ADA: New Consensus Statement on Inpatient Glycemic Control (2013)  Target Ranges:  Prepandial:   less than 140 mg/dL      Peak postprandial:   less than 180 mg/dL (1-2 hours)      Critically ill patients:  140 - 180 mg/dL   Reason for Visit: Diabetes Consult  Diabetes history: DM2 - diagnosed in June 2015 Outpatient Diabetes medications: Levemir 10 units QAM Current orders for Inpatient glycemic control: Levemir 10 units QAM, Novolog resistant tidwc and hs  Patient presents to Ed with increased left foot swelling with ulceration, drainage and foul odor for a couple of days.   Results for Christiana PellantLEONARD, Sabrina R (MRN [id number]) as of 08/28/2014 15:59  Ref. Range 08/27/2014 16:43 08/27/2014 22:28 08/28/2014 07:37 08/28/2014 11:43  Glucose-Capillary Latest Range: 70-99 mg/dL 782148 (H) 956182 (H) 213121 (H) 139 (H)      Results for Christiana PellantLEONARD, Toryn R (MRN [id number]) as of 08/28/2014 15:59  Ref. Range 06/08/2014 18:44  Hgb A1c MFr Bld Latest Range: <5.7 % 7.0 (H)   Updated HgbA1C pending. Blood sugars look good and pt is NPO for amputation. Did not receive Lantus this morning.  Agree with orders. May benefit from OP Diabetes Education consult for DM2. Will order.  Continue to follow.  Thank you. Ailene Ardshonda Saraih Lorton, RD, LDN, CDE Inpatient Diabetes Coordinator [phone]

## 2014-08-28 NOTE — Progress Notes (Signed)
[facility] PROGRESS NOTE  Christiana PellantRonald R Mclouth WUJ:[id number] DOB: [birth year] DOA: 08/27/2014 PCP: Jeanann LewandowskyJEGEDE, OLUGBEMIGA, MD  Assessment/Plan: 1. Left foot gangrene/ostomyelitus- orthopedics following, plan for surgery today. Continue IV antibiotics. 2. Diabetes mellitus- will check hemoglobin A1c, last A1c from November 2015 was 7.0. Patient started on sliding-scale insulin with NovoLog. Blood glucose is well controlled. 3. Hypertension- patient is nothing by mouth, started on metoprolol 2.5 mg IV every 6 hours. Also started when necessary hydralazine. Blood pressure has remained stable. 4. DVT prophylaxis- SCDs     *Code Status: Full code     Antibiotics:  Vancomycin  Zosyn  HPI/Subjective: 54 year old male with a history of worsening left lateral foot swelling, drainage in order presented to the hospital yesterday for worsening appearance of the fourth toe turning to purple color. Patient was diagnosed with acute ostieomyelitus and was seen by orthopedic surgery. IV antibiotics have been started. Patient underwent ABI this morning, plan is for toe amputation today.  Objective: Filed Vitals:   08/28/14 0511  BP: 117/69  Pulse: 76  Temp: 98.6 F (37 C)  Resp: 16    Intake/Output Summary (Last 24 hours) at 08/28/14 1136 Last data filed at 08/28/14 0900  Gross per 24 hour  Intake     50 ml  Output      0 ml  Net     50 ml   Filed Weights   08/27/14 2231  Weight: 77.792 kg (171 lb 8 oz)    Exam:  Physical Exam: Eyes: No icterus, extraocular muscles intact  Lungs: Normal respiratory effort, bilateral clear to auscultation, no crackles or wheezes.  Heart: Regular rate and rhythm, S1 and S2 normal, no murmurs, rubs auscultated Abdomen: BS normoactive,soft,nondistended,non-tender to palpation,no organomegaly Extremities: Gangrene noted in the left fourth toe, status post big toe amputation on left Neuro : Alert and oriented to time, place and person, No focal  deficits  Data Reviewed: Basic Metabolic Panel:  Recent Labs Lab 08/27/14 1454 08/28/14 0535  NA 133* 135  K 4.1 3.7  CL 97 101  CO2 27 28  GLUCOSE 219* 129*  BUN 12 14  CREATININE 0.91 0.92  CALCIUM 9.1 8.4   Liver Function Tests:  Recent Labs Lab 08/28/14 0535  AST 14  ALT 16  ALKPHOS 85  BILITOT 0.9  PROT 7.0  ALBUMIN 2.9*   No results for input(s): LIPASE, AMYLASE in the last 168 hours. No results for input(s): AMMONIA in the last 168 hours. CBC:  Recent Labs Lab 08/27/14 1454 08/28/14 0535  WBC 16.6* 11.5*  HGB 12.6* 10.6*  HCT 37.4* 31.8*  MCV 89.5 90.6  PLT 266 235   Cardiac Enzymes: No results for input(s): CKTOTAL, CKMB, CKMBINDEX, TROPONINI in the last 168 hours. BNP (last 3 results)  Recent Labs  01/11/14 1855  PROBNP 148.5*   CBG:  Recent Labs Lab 08/27/14 1643 08/27/14 2228 08/28/14 0737  GLUCAP 148* 182* 121*    No results found for this or any previous visit (from the past 240 hour(s)).   Studies: Dg Foot Complete Left  08/27/2014   CLINICAL DATA:  Discolored fifth toe.  EXAM: LEFT FOOT - COMPLETE 3+ VIEW  COMPARISON:  January 11, 2014.  FINDINGS: Status post surgical amputation of the distal first metacarpal and more distal phalanges. Moderately displaced pathologic fracture is seen involving the distal fifth metatarsal with overlying soft tissue defect consistent with osteomyelitis.  IMPRESSION: Acute osteomyelitis of distal fifth metatarsal with overlying soft tissue wound.   Electronically Signed  By: Roque Lias M.D.   On: 08/27/2014 15:28    Scheduled Meds: . docusate sodium  100 mg Oral BID  . insulin aspart  0-20 Units Subcutaneous TID WC  . insulin aspart  0-5 Units Subcutaneous QHS  . insulin detemir  10 Units Subcutaneous QAC breakfast  . metoprolol  2.5 mg Intravenous 4 times per day  . metoprolol  5 mg Intravenous Once  . piperacillin-tazobactam (ZOSYN)  IV  3.375 g Intravenous Q8H  . sodium chloride  3 mL  Intravenous Q12H  . vancomycin  750 mg Intravenous Q8H   Continuous Infusions:   Principal Problem:   Gangrene of foot Active Problems:   Osteomyelitis   Leukocytosis   Diabetic foot   Diabetes mellitus type 2 with complications   Essential hypertension   Foot osteomyelitis, left    Time spent: 25 min    [facility]  [facility] Pager [phone]*. If 7PM-7AM, please contact night-coverage at [email or web address], password [facility] 08/28/2014, 11:36 AM  LOS: 1 day

## 2014-08-28 NOTE — Progress Notes (Signed)
CARE MANAGEMENT NOTE 08/28/2014  Patient:  Ryan [name],Ryan [name]   Account Number:  [id number]  Date Initiated:  08/28/2014  Documentation initiated by:  Lanier ClamMAHABIR,Masaji Billups  Subjective/Objective Assessment:   54 y/o m admitted w/L foot osteomyelitis.     Action/Plan:   Homeless,but has a car.   Anticipated DC Date:  08/31/2014   Anticipated DC Plan:  HOME/SELF CARE  In-house referral  Financial Counselor      DC Planning Services  CM consult  [facility]igent [facility]      Choice offered to / List presented to:             Status of service:  In process, will continue to follow Medicare Important Message given?   (If response is "NO", the following Medicare IM given date fields will be blank) Date Medicare IM given:   Medicare IM given by:   Date Additional Medicare IM given:   Additional Medicare IM given by:    Discharge Disposition:    Per UR Regulation:    If discussed at Long Length of Stay Meetings, dates discussed:    Comments:  08/28/14 Lanier ClamKathy Maddex Garlitz RN BSN NCM [phone] Spoke to patient about d/c plans. "I can't stay in a shelter".Financial counselor brought another financial packet for patient to complete.Informed him of the importance of completing all financial info needed to proceed toward getting any financial asst.TC [facility] they confirmed that patient can come there for f/u pcp appt, he has levimir 3 boxes worth still in the pharmacy.They can help him with his meds.He has used his 1x free asst from [facility],but going forward his meds are discounted drastically.CSW to f/u w/resources for housing.S/P l foot 5th ray removal. Continue to monitor progress.

## 2014-08-28 NOTE — Brief Op Note (Signed)
08/27/2014 - 08/28/2014  5:44 PM  PATIENT:  Christiana Pellantonald R Carlile  54 y.o. male  PRE-OPERATIVE DIAGNOSIS:  Left Foot Infection, Necrosis, Peripheral Vascular Disease  POST-OPERATIVE DIAGNOSIS:  Left Foot Infection, Necrosis, Peripheral Vascular Disease  PROCEDURE:  Procedure(s): Left Foot Fifth ray resection (Left)  SURGEON:  Surgeon(s) and Role:    * Kathryne Hitchhristopher Y Reagen Goates, MD - Primary  PHYSICIAN ASSISTANT: Rexene EdisonGil Clark, PA-C  ANESTHESIA:   general  EBL:  Total I/O In: 1100 [I.V.:1000; IV Piggyback:100] Out: -   BLOOD ADMINISTERED:none  DRAINS: none   LOCAL MEDICATIONS USED:  NONE  SPECIMEN:  Excision  DISPOSITION OF SPECIMEN:  PATHOLOGY  COUNTS:  YES  TOURNIQUET:    DICTATION: .Other Dictation: Dictation Number [id number]  PLAN OF CARE: Admit to inpatient   PATIENT DISPOSITION:  PACU - hemodynamically stable.   Delay start of Pharmacological VTE agent (>24hrs) due to surgical blood loss or risk of bleeding: no

## 2014-08-28 NOTE — Transfer of Care (Signed)
Immediate Anesthesia Transfer of Care Note  Patient: [name]  Procedure(s) Performed: Procedure(s): Left Foot Fifth ray resection (Left)  Patient Location: PACU  Anesthesia Type:General  Level of Consciousness: awake, alert  and oriented  Airway & Oxygen Therapy: Patient Spontanous Breathing and Patient connected to face mask oxygen  Post-op Assessment: Report given to RN and Post -op Vital signs reviewed and stable  Post vital signs: Reviewed and stable  Last Vitals:  Filed Vitals:   08/28/14 1400  BP: 151/79  Pulse: 76  Temp: 36.7 C  Resp: 10    Complications: No apparent anesthesia complications

## 2014-08-28 NOTE — Progress Notes (Signed)
Writer assumed care of pt at this time. I agree with previous RN's assessment. Chaplain at bedside. Pt appears cooperative at this time. Will continue to monitor pt and carry out POC.  Ryan [name], Araceli Coufal I

## 2014-08-28 NOTE — Progress Notes (Signed)
PT Cancellation Note  Patient Details Name: Ryan [name] MRN: [id number] DOB: [birth year]   Cancelled Treatment:    Reason Eval/Treat Not Completed: Patient not medically ready; pt going for TMA later this pm, will await orders from ortho after surgery and proceed accordingly   [facility]anoWILLIAMS,Ndidi Nesby 08/28/2014, 12:16 PM

## 2014-08-29 LAB — GLUCOSE, CAPILLARY
Glucose-Capillary: 145 mg/dL — ABNORMAL HIGH (ref 70–99)
Glucose-Capillary: 242 mg/dL — ABNORMAL HIGH (ref 70–99)
Glucose-Capillary: 119 mg/dL — ABNORMAL HIGH (ref 70–99)
Glucose-Capillary: 149 mg/dL — ABNORMAL HIGH (ref 70–99)

## 2014-08-29 LAB — HEMOGLOBIN A1C
HEMOGLOBIN A1C: 9.6 % — AB (ref 4.8–5.6)
HEMOGLOBIN A1C: 9.6 % — AB (ref 4.8–5.6)
MEAN PLASMA GLUCOSE: 229 mg/dL
Mean Plasma Glucose: 229 mg/dL

## 2014-08-29 MED ORDER — HYDROCODONE-ACETAMINOPHEN 5-325 MG PO TABS: 1.0000 | ORAL_TABLET | Freq: Four times a day (QID) | ORAL | Status: DC | PRN

## 2014-08-29 NOTE — Discharge Instructions (Signed)
Stay off of your foot as much as possible. Work on good blood glucose conrtol. Keep your left foot dressing clean and dry until your ortho outpatient follow-up.

## 2014-08-29 NOTE — Progress Notes (Signed)
[facility] PROGRESS NOTE  Christiana PellantRonald R Klomp RUE:[id number] DOB: [birth year] DOA: 08/27/2014 PCP: Jeanann LewandowskyJEGEDE, OLUGBEMIGA, MD  Assessment/Plan: 1. Left foot gangrene/ostomyelitus, s/p left foot fifth ray amputation-orthopedics following. Continue IV antibiotics.  2. Diabetes mellitus- will check hemoglobin A1c, A1c 9.6, last A1c from November 2015 was 7.0. Patient started on sliding-scale insulin with NovoLog. Blood glucose is well controlled in the hospital. Patient takes Levemir at home and says that he never got extra supplies from the company as he was told. Will consult the social Designer, industrial/productwork/case manager. 3. Hypertension- patient is nothing by mouth, started on metoprolol 2.5 mg IV every 6 hours. Also started when necessary hydralazine. Blood pressure has remained stable. 4. DVT prophylaxis- SCDs     *Code Status: Full code     Antibiotics:  Vancomycin  Zosyn  HPI/Subjective: 54 year old male with a history of worsening left lateral foot swelling, drainage in order presented to the hospital yesterday for worsening appearance of the fourth toe turning to purple color. Patient was diagnosed with acute ostieomyelitus and was seen by orthopedic surgery. IV antibiotics have been started. Patient underwent left foot fifth ray amputation. Objective: Filed Vitals:   08/29/14 0454  BP: 160/68  Pulse: 87  Temp: 99.5 F (37.5 C)  Resp: 16    Intake/Output Summary (Last 24 hours) at 08/29/14 1013 Last data filed at 08/29/14 0502  Gross per 24 hour  Intake 2229.17 ml  Output    875 ml  Net 1354.17 ml   Filed Weights   08/27/14 2231 08/29/14 0454  Weight: 77.792 kg (171 lb 8 oz) 76.2 kg (167 lb 15.9 oz)    Exam:  Physical Exam: Eyes: No icterus, extraocular muscles intact  Lungs: Normal respiratory effort, bilateral clear to auscultation, no crackles or wheezes.  Heart: Regular rate and rhythm, S1 and S2 normal, no murmurs, rubs auscultated Abdomen: BS  normoactive,soft,nondistended,non-tender to palpation,no organomegaly Extremities: Gangrene noted in the left fourth toe, status post big toe amputation on left Neuro : Alert and oriented to time, place and person, No focal deficits  Data Reviewed: Basic Metabolic Panel:  Recent Labs Lab 08/27/14 1454 08/28/14 0535  NA 133* 135  K 4.1 3.7  CL 97 101  CO2 27 28  GLUCOSE 219* 129*  BUN 12 14  CREATININE 0.91 0.92  CALCIUM 9.1 8.4   Liver Function Tests:  Recent Labs Lab 08/28/14 0535  AST 14  ALT 16  ALKPHOS 85  BILITOT 0.9  PROT 7.0  ALBUMIN 2.9*   No results for input(s): LIPASE, AMYLASE in the last 168 hours. No results for input(s): AMMONIA in the last 168 hours. CBC:  Recent Labs Lab 08/27/14 1454 08/28/14 0535  WBC 16.6* 11.5*  HGB 12.6* 10.6*  HCT 37.4* 31.8*  MCV 89.5 90.6  PLT 266 235   Cardiac Enzymes: No results for input(s): CKTOTAL, CKMB, CKMBINDEX, TROPONINI in the last 168 hours. BNP (last 3 results)  Recent Labs  01/11/14 1855  PROBNP 148.5*   CBG:  Recent Labs Lab 08/28/14 0737 08/28/14 1143 08/28/14 1756 08/28/14 2118 08/29/14 0756  GLUCAP 121* 139* 97 191* 145*    Recent Results (from the past 240 hour(s))  Surgical pcr screen     Status: None   Collection Time: 08/28/14 11:17 AM  Result Value Ref Range Status   MRSA, PCR NEGATIVE NEGATIVE Final   Staphylococcus aureus NEGATIVE NEGATIVE Final    Comment:        The Xpert SA Assay (FDA approved for NASAL specimens  in patients over 36 years of age), is one component of a comprehensive surveillance program.  Test performance has been validated by [facility] for patients greater than or equal to 69 year old. It is not intended to diagnose infection nor to guide or monitor treatment.      Studies: Dg Foot Complete Left  08/27/2014   CLINICAL DATA:  Discolored fifth toe.  EXAM: LEFT FOOT - COMPLETE 3+ VIEW  COMPARISON:  January 11, 2014.  FINDINGS: Status post surgical  amputation of the distal first metacarpal and more distal phalanges. Moderately displaced pathologic fracture is seen involving the distal fifth metatarsal with overlying soft tissue defect consistent with osteomyelitis.  IMPRESSION: Acute osteomyelitis of distal fifth metatarsal with overlying soft tissue wound.   Electronically Signed   By: Roque Lias M.D.   On: 08/27/2014 15:28    Scheduled Meds: . docusate sodium  100 mg Oral BID  . insulin aspart  0-20 Units Subcutaneous TID WC  . insulin aspart  0-5 Units Subcutaneous QHS  . insulin detemir  10 Units Subcutaneous QAC breakfast  . metoprolol  2.5 mg Intravenous 4 times per day  . metoprolol  5 mg Intravenous Once  . piperacillin-tazobactam (ZOSYN)  IV  3.375 g Intravenous Q8H  . sodium chloride  3 mL Intravenous Q12H  . vancomycin  750 mg Intravenous Q8H   Continuous Infusions: . lactated ringers 125 mL/hr at 08/29/14 0143    Principal Problem:   Gangrene of foot Active Problems:   Osteomyelitis   Leukocytosis   Diabetic foot   Diabetes mellitus type 2 with complications   Essential hypertension   Foot osteomyelitis, left    Time spent: 25 min    [facility]  [facility] Pager [phone]*. If 7PM-7AM, please contact night-coverage at [email or web address], password [facility] 08/29/2014, 10:13 AM  LOS: 2 days

## 2014-08-29 NOTE — Evaluation (Signed)
Physical Therapy Evaluation Patient Details Name: Ryan [name] MRN: [id number] DOB: 24-Sep-1960 Today's Date: 08/29/2014   History of Present Illness  54 y.o. male with h/o DM, R 1st and 5th toe amputations, L great toe amputation admitted with osteomyelitis in L 5th ray, s/p amputation 08/28/14.   Clinical Impression  *Pt admitted with above diagnosis. Pt currently with functional limitations due to the deficits listed below (see PT Problem List). * Pt will benefit from skilled PT to increase their independence and safety with mobility to allow discharge to the venue listed below.    Pt ambulated 300' independently without assistive device, no loss of balance. He is independent with mobility and is safe to walk in halls without assist. Encouraged pt to walk in halls TID. No further acute PT needs. PT signing off.  **    Follow Up Recommendations No PT follow up    Equipment Recommendations  None recommended by PT    Recommendations for Other Services       Precautions / Restrictions Restrictions Weight Bearing Restrictions: No LLE Weight Bearing: Weight bearing as tolerated      Mobility  Bed Mobility Overal bed mobility: Independent                Transfers Overall transfer level: Independent                  Ambulation/Gait Ambulation/Gait assistance: Independent Ambulation Distance (Feet): 300 Feet Assistive device: None       General Gait Details: L post op shoe, no LOB with walking  Stairs            Wheelchair Mobility    Modified Rankin (Stroke Patients Only)       Balance                                             Pertinent Vitals/Pain Pain Assessment: 0-10 Pain Score: 2  Pain Descriptors / Indicators: Sore Pain Intervention(s): Limited activity within patient's tolerance;Monitored during session    Home Living Family/patient expects to be discharged to:: Private residence Living Arrangements: Alone    Type of Home: Mobile home Home Access: Stairs to enter Entrance Stairs-Rails: Doctor, general practiceight;Left Entrance Stairs-Number of Steps: 4 Home Layout: One level Home Equipment: Cane - single point      Prior Function Level of Independence: Independent               Hand Dominance        Extremity/Trunk Assessment   Upper Extremity Assessment: Overall WFL for tasks assessed           Lower Extremity Assessment: Overall WFL for tasks assessed      Cervical / Trunk Assessment: Normal  Communication   Communication: No difficulties  Cognition Arousal/Alertness: Awake/alert Behavior During Therapy: WFL for tasks assessed/performed Overall Cognitive Status: Within Functional Limits for tasks assessed                      General Comments      Exercises General Exercises - Lower Extremity Ankle Circles/Pumps: AROM;Both;15 reps;Supine      Assessment/Plan    PT Assessment Patent does not need any further PT services  PT Diagnosis Acute pain   PT Problem List    PT Treatment Interventions     PT Goals (Current goals can be found in the  Care Plan section) Acute Rehab PT Goals Patient Stated Goal: pt would like to be able to work out PT Goal Formulation: All assessment and education complete, DC therapy    Frequency     Barriers to discharge        Co-evaluation               End of Session Equipment Utilized During Treatment: Gait belt Activity Tolerance: Patient tolerated treatment well Patient left: in chair;with call bell/phone within reach Nurse Communication: Mobility status         Time: 1610-9604 PT Time Calculation (min) (ACUTE ONLY): 21 min   Charges:   PT Evaluation $Initial PT Evaluation Tier I: 1 Procedure     PT G CodesTamala Ser 08/29/2014, 10:22 AM [phone]

## 2014-08-29 NOTE — Progress Notes (Addendum)
Chaplain paged at 2211 and 2214 and arrived in unit at 2234.  Ryan [name] is a 54 year old patient who had a toe amputated today. He had others amputated in the past. After returning to his room Ryan [name] became upset at being told he could not go to the restroom but had to use a portable toilet. He became angry when he perceived the nurse would be "watching." He took his IV out and, against best medical advice, went to the restroom rather than use the portable toilet. This left him without an IV to give him his antibiotics. The situation ramped up to shouting ending in security/police becoming involved.  The chaplain engaged in active listening to determine if there was deeper issues involved. There were several. 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 The congregation is supportive of him, but he resists being given help that he can not repay. In his life he has found that people he has known have always wanted something in return. The idea of having people help for religious/spiritual reasons is still alien to him. 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 Being in the same hospital that his good friend died in puts an edge on his current hospitalization. He has lived a very fractured life and is currently unemployed due to his medical situation.  This embarrasses him. He feels he is useless and wonders if he is a burden on those that help him. He wonders if he is worthy of living.  He reports that even with the absence of his toes, he is not able to be classified as partly disabled. He reports he thought he was insured but discovers he is not. This too embarrasses him.  Ryan [name] is a very modest person. He is defensive at being unable to pay for his care. He is best cared for by giving him the same excellent care we give to all and showing him by such care that he is safe and accepted here.  STRONGLY RECOMMEND: Should Ryan  Darcel [name] become anxious or unruly again, that the chaplain be paged to assist in bringing him back into balance physically, mentally and spiritually. He is responding well to the chaplain, and the newly assigned nurse. He bears no ill with his former nurse, but feels ashamed at how he treated her.  Page the chaplain if Ryan [name] needs or requests further spiritual care.  RECOMMEND: A Social Work visit to assist in dealing with his social issues connected to his current situation  Leonette MostCharles D. Yajaira Doffing, DMin, MDIV, MA Chaplain

## 2014-08-29 NOTE — Op Note (Signed)
NAMOr[place] Dec:  Ryan [name], Yechezkel              ACCOUNT NO.:  [id number]  MEDICAL RECORD NO.:  [id number]  LOCATION:  1421                         [facility]  PHYSICIAN:  Vanita PandaChristopher Y. Magnus IvanBlackman, M.D.DATE OF BIRTH:  26-Feb-1961  DATE OF PROCEDURE:  08/28/2014 DATE OF DISCHARGE:                              OPERATIVE REPORT   PREOPERATIVE DIAGNOSIS:  Left foot necrotic wound with active infection and osteomyelitis involving the fifth metatarsal.  POSTOPERATIVE DIAGNOSIS:  Left foot necrotic wound with active infection and osteomyelitis involving the fifth metatarsal.  PROCEDURE:  Left foot fifth ray amputation.  FINDINGS:  Gross purulence and infection with osteomyelitis of the fifth ray, left foot.  SURGEON:  Vanita PandaChristopher Y. Magnus IvanBlackman, M.D.  ASSISTANT:  Richardean CanalGilbert Clark, PA-C.  ANESTHESIA:  General.  BLOOD LOSS:  Minimal.  COMPLICATIONS:  None.  INDICATIONS:  Mr. Darcel BayleyLeonard is a 54 year old diabetic with peripheral vascular disease and worsening ulcer on the lateral aspect of his left foot.  His fifth ray shows osteomyelitis with bony destruction of the metatarsal head.  It is malodorous and has draining purulence and necrotic tissue.  He has had a previous great toe amputation on that side.  I recommended a fifth ray resection, however, with possible transmetatarsal amputation.  He was reluctant to perform a transmetatarsal amputation, so we are going to attempt to just debride the tissue and perform a fifth ray resection with hopefully getting a closed and away that will allow him to heal.  I could not give him any guarantees that would heal and he still may end up with a transmetatarsal amputation which he understands.  PROCEDURE DESCRIPTION:  After informed consent was obtained, appropriate left foot was marked.  He was brought to the operating room, placed supine on the operating table.  General anesthesia was then obtained. His left foot was prepped and draped with DuraPrep and  sterile drapes up to the mid leg.  A time-out was called to identify correct patient, correct left foot.  We then used a towel around the ankle and then Esmarch just around the ankle as a local tourniquet.  I used a #10 blade and removed the fifth ray in its entirety.  Also using an oscillating saw to cut the metatarsal proximally, we found obvious infected fifth metatarsal head and necrotic bone.  There was gross purulence throughout the fifth ray.  We copiously irrigated the soft tissues with normal saline and used a #10 blade to remove necrotic skin, soft tissue, and bone.  Once we got it thoroughly cleaned and the necrotic tissue was going to let the Esmarch down and hemostasis was obtained with electrocautery.  We then removed eschar over the great toe where it was able to close the tissue around this.  We then loosely closed the tissue along the fifth ray.  We then placed Xeroform, well-padded sterile dressing.  He was awakened, extubated, and taken to recovery room in stable condition.  All final counts were correct.  There were no complications noted.  Of note, Richardean CanalGilbert Clark, PA-C, assisted the entire case and his assistance was helpful for facilitating this case at all aspects.     Vanita Pandahristopher Y. Magnus IvanBlackman, M.D.  CYB/MEDQ  D:  08/28/2014  T:  08/29/2014  Job:  [id number]

## 2014-08-29 NOTE — Progress Notes (Signed)
CARE MANAGEMENT NOTE 08/29/2014  Patient:  Ryan [name],Ryan [name]   Account Number:  [id number]  Date Initiated:  08/28/2014  Documentation initiated by:  Lanier ClamMAHABIR,Shadonna Benedick  Subjective/Objective Assessment:   54 y/o m admitted w/L foot osteomyelitis.     Action/Plan:   Homeless,but has a car.   Anticipated DC Date:  08/31/2014   Anticipated DC Plan:  HOME/SELF CARE  In-house referral  Financial Counselor      DC Planning Services  CM consult  Indigent Health Clinic      Choice offered to / List presented to:             Status of service:  In process, will continue to follow Medicare Important Message given?   (If response is "NO", the following Medicare IM given date fields will be blank) Date Medicare IM given:   Medicare IM given by:   Date Additional Medicare IM given:   Additional Medicare IM given by:    Discharge Disposition:    Per UR Regulation:    If discussed at Long Length of Stay Meetings, dates discussed:    Comments:  08/29/14 Lanier ClamKathy Royce Stegman RN BSN NCM [phone] Spoke to patient again about d/c needs.Informed patient of 3 boxes of levimir that is sitting in the pharmacy @ [facility] will be able to provide med ast for him, & he can see their pcp.He says he may not go home on levimir.  We may have to use MATCH program if he qualifies, if not then we will try another med asst program/patient asst program through insulin company that he will d/c on.He says he has a home to go to, & has someone to pick him up.Patient voiced understanding.  08/28/14 Lanier ClamKathy Lindwood Mogel RN BSN NCM [phone] Spoke to patient about d/c plans. "I can't stay in a shelter".Financial counselor brought another financial packet for patient to complete.Informed him of the importance of completing all financial info needed to proceed toward getting any financial asst.TC [facility] they confirmed that patient can come there for f/u pcp appt, he has levimir 3 boxes worth still in the pharmacy.They can help him with his  meds.He has used his 1x free asst from [facility],but going forward his meds are discounted drastically.CSW to f/u w/resources for housing.S/P l foot 5th ray removal. Continue to monitor progress.

## 2014-08-29 NOTE — Progress Notes (Signed)
Subjective: 1 Day Post-Op Procedure(s) (LRB): Left Foot Fifth ray resection (Left) Patient reports pain as mild.    Objective: Vital signs in last 24 hours: Temp:  [97.4 F (36.3 C)-99.5 F (37.5 C)] 97.7 F (36.5 C) (01/30 1308) Pulse Rate:  [77-102] 77 (01/30 1308) Resp:  [14-18] 18 (01/30 1308) BP: (137-180)/(59-88) 139/63 mmHg (01/30 1308) SpO2:  [98 %-100 %] 100 % (01/30 1308) Weight:  [76.2 kg (167 lb 15.9 oz)] 76.2 kg (167 lb 15.9 oz) (01/30 0454)  Intake/Output from previous day: 01/29 0701 - 01/30 0700 In: 2229.2 [I.V.:1729.2; IV Piggyback:500] Out: 875 [Urine:875] Intake/Output this shift: Total I/O In: 720 [P.O.:720] Out: -    Recent Labs  08/27/14 1454 08/28/14 0535  HGB 12.6* 10.6*    Recent Labs  08/27/14 1454 08/28/14 0535  WBC 16.6* 11.5*  RBC 4.18* 3.51*  HCT 37.4* 31.8*  PLT 266 235    Recent Labs  08/27/14 1454 08/28/14 0535  NA 133* 135  K 4.1 3.7  CL 97 101  CO2 27 28  BUN 12 14  CREATININE 0.91 0.92  GLUCOSE 219* 129*  CALCIUM 9.1 8.4   No results for input(s): LABPT, INR in the last 72 hours.  Incision: dressing C/D/I  Assessment/Plan: 1 Day Post-Op Procedure(s) (LRB): Left Foot Fifth ray resection (Left) Continue IV antibiotics today due to the extent of his infection. Discontinue IV antibiotics tomorrow 1/31 and start oral doxycycline 100 mg twice daily. Can be discharged once blood glucose under control and has means for home meds and diabetic counciling.  Micole Delehanty Y 08/29/2014, 2:58 PM

## 2014-08-30 LAB — GLUCOSE, CAPILLARY
Glucose-Capillary: 117 mg/dL — ABNORMAL HIGH (ref 70–99)
Glucose-Capillary: 198 mg/dL — ABNORMAL HIGH (ref 70–99)
Glucose-Capillary: 158 mg/dL — ABNORMAL HIGH (ref 70–99)

## 2014-08-30 MED ORDER — DOXYCYCLINE HYCLATE 100 MG PO TABS
100.0000 mg | ORAL_TABLET | Freq: Two times a day (BID) | ORAL | Status: DC
  Administered 2014-08-30 – 2014-08-31 (×3): 100 mg via ORAL
  Filled 2014-08-30 (×5): qty 1

## 2014-08-30 NOTE — Progress Notes (Signed)
[facility] PROGRESS NOTE  Ryan [name] JXB:[id number] DOB: 27-Jan-1961 DOA: 08/27/2014 PCP: Jeanann LewandowskyJEGEDE, OLUGBEMIGA, MD  Assessment/Plan: 1. Left foot gangrene/ostomyelitus, s/p left foot fifth ray amputation-orthopedics following. Will d/c the IV antibiotics as per ortho recommendation. Will start Doxycycline 100 mg po bid. 2. Diabetes mellitus- will check hemoglobin A1c, A1c 9.6, last A1c from November 2015 was 7.0. Patient started on sliding-scale insulin with NovoLog. Blood glucose is well controlled in the hospital. Patient takes Levemir at home and says that he never got extra supplies from the company as he was told. Case management discussed the financial issues and getting Levemir as outpatient. Likely can be discharged in am. 3. Hypertension- patient is nothing by mouth, started on metoprolol 2.5 mg IV every 6 hours. Also started when necessary hydralazine. Blood pressure has remained stable. 4. DVT prophylaxis- SCDs     *Code Status: Full code     Antibiotics:  Vancomycin  Zosyn  HPI/Subjective: 54 year old male with a history of worsening left lateral foot swelling, drainage in order presented to the hospital yesterday for worsening appearance of the fourth toe turning to purple color. Patient was diagnosed with acute ostieomyelitus and was seen by orthopedic surgery. IV antibiotics have been started. Patient underwent left foot fifth ray amputation. Denies any pain. Blood glucose is well controlled. Objective: Filed Vitals:   08/30/14 0602  BP: 161/84  Pulse: 84  Temp: 98 F (36.7 C)  Resp: 20    Intake/Output Summary (Last 24 hours) at 08/30/14 1143 Last data filed at 08/30/14 0753  Gross per 24 hour  Intake 4821.25 ml  Output      0 ml  Net 4821.25 ml   Filed Weights   08/27/14 2231 08/29/14 0454 08/30/14 0602  Weight: 77.792 kg (171 lb 8 oz) 76.2 kg (167 lb 15.9 oz) 76.703 kg (169 lb 1.6 oz)    Exam:  Physical Exam: Eyes: No icterus,  extraocular muscles intact  Lungs: Normal respiratory effort, bilateral clear to auscultation, no crackles or wheezes.  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 No results for input(s): AMMONIA in the last 168 hours. CBC:  Recent Labs Lab 08/27/14 1454 08/28/14 0535  WBC 16.6* 11.5*  HGB 12.6* 10.6*  HCT 37.4* 31.8*  MCV 89.5 90.6  PLT 266 235   Cardiac Enzymes: No results for input(s): CKTOTAL, CKMB, CKMBINDEX, TROPONINI in the last 168 hours. BNP (last 3 results)  Recent Labs  01/11/14 1855  PROBNP 148.5*   CBG:  Recent Labs Lab 08/29/14 0756 08/29/14 1146 08/29/14 1656 08/29/14 2145 08/30/14 0720  GLUCAP 145* 242* 119* 149* 117*    Recent Results (from the past 240 hour(s))  Culture, blood (routine x 2)     Status: None (Preliminary result)  Collection Time: 08/27/14 11:02 PM  Result Value Ref Range Status   Specimen Description BLOOD LEFT ARM  Final   Special Requests BOTTLES DRAWN AEROBIC AND ANAEROBIC 10CC  Final   Culture   Final           BLOOD CULTURE RECEIVED NO GROWTH TO DATE CULTURE WILL BE HELD FOR 5 DAYS BEFORE ISSUING A FINAL NEGATIVE REPORT Note: Culture results may be compromised due to an excessive volume of blood received in culture bottles. Performed at [facility]    Report Status PENDING  Incomplete  Culture, blood (routine x 2)     Status: None (Preliminary result)   Collection Time: 08/27/14 11:07 PM  Result Value Ref Range Status   Specimen Description BLOOD LEFT HAND  Final   Special Requests BOTTLES DRAWN AEROBIC AND ANAEROBIC 10CC  Final   Culture   Final           BLOOD CULTURE RECEIVED NO GROWTH TO DATE CULTURE WILL BE HELD FOR 5 DAYS BEFORE ISSUING A FINAL NEGATIVE REPORT Note: Culture results may be compromised due to an excessive volume of blood received in culture bottles. Performed at [facility]    Report Status PENDING  Incomplete  Surgical pcr screen     Status: None   Collection Time: 08/28/14 11:17 AM  Result Value Ref Range Status   MRSA, PCR NEGATIVE NEGATIVE Final   Staphylococcus aureus NEGATIVE NEGATIVE Final    Comment:        The Xpert SA Assay (FDA approved for NASAL specimens in patients over 46 years of age), is one component of a comprehensive surveillance program.  Test performance has been validated by [facility] for patients greater than or equal to 40 year old. It is not intended to diagnose infection nor to guide or monitor treatment.      Studies: No results found.  Scheduled Meds: . docusate sodium  100 mg Oral BID  . insulin aspart  0-20 Units Subcutaneous TID WC  . insulin aspart  0-5 Units Subcutaneous QHS  . insulin detemir  10 Units Subcutaneous QAC breakfast  . metoprolol  2.5 mg Intravenous 4 times per day  . metoprolol  5 mg Intravenous Once  . sodium chloride  3 mL Intravenous Q12H   Continuous Infusions: . lactated ringers 125 mL/hr at 08/29/14 2043    Principal Problem:   Gangrene of foot Active Problems:   Osteomyelitis   Leukocytosis   Diabetic foot   Diabetes mellitus type 2 with complications   Essential hypertension   Foot osteomyelitis, left    Time spent: 25 min    [facility]  [facility] Pager [phone]*. If 7PM-7AM, please  contact night-coverage at [email or web address], password [facility] 08/30/2014, 11:43 AM  LOS: 3 days

## 2014-08-30 NOTE — Progress Notes (Signed)
Chaplain follow-up visit  Ryan [name] is spiritually and emotionally at peace. He speaks of his regrets for being an unruly patient on Friday and seeks genuinely repentant. He has been visited by a leader of his faith community and this has assisted in his finding spiritual and emotional balance.  Ryan [name] reports he is to be released and is looking forward to returning to his home. Some reports indicate that he is homeless, but he reports he has a place to live but may have problems paying his rent due to not being able to work while in the hospital.  RECOMMEND: Social Work visit prior to his release to assist him sort out some life options.  Please page a chaplain should Ryan [name] need or request such.  Benjie Karvonenharles D. Wing Gfeller, DMin Chaplain

## 2014-08-31 ENCOUNTER — Encounter (HOSPITAL_COMMUNITY): Payer: Self-pay

## 2014-08-31 LAB — GLUCOSE, CAPILLARY
GLUCOSE-CAPILLARY: 104 mg/dL — AB (ref 70–99)
Glucose-Capillary: 142 mg/dL — ABNORMAL HIGH (ref 70–99)

## 2014-08-31 NOTE — Care Management Note (Signed)
    Page 1 of 2   08/31/2014     11:19:37 AM CARE MANAGEMENT NOTE 08/31/2014  Patient:  Ryan PellantLEONARD,[name]   Account Number:  [id number]  Date Initiated:  08/28/2014  Documentation initiated by:  Lanier ClamMAHABIR,Larri Brewton  Subjective/Objective Assessment:   54 y/o m admitted w/L foot osteomyelitis.     Action/Plan:   Homeless,but has a car.   Anticipated DC Date:  08/31/2014   Anticipated DC Plan:  HOME/SELF CARE  In-house referral  Financial Counselor      DC Planning Services  CM consult  Indigent [facility] Clinic  Medication Assistance      Choice offered to / List presented to:             Status of service:  Completed, signed off Medicare Important Message given?   (If response is "NO", the following Medicare IM given date fields will be blank) Date Medicare IM given:   Medicare IM given by:   Date Additional Medicare IM given:   Additional Medicare IM given by:    Discharge Disposition:  HOME/SELF CARE  Per UR Regulation:  Reviewed for med. necessity/level of care/duration of stay  If discussed at Long Length of Stay Meetings, dates discussed:    Comments:  08/31/14 Lanier ClamKathy Ronel Rodeheaver RN BSN NCM [phone] Spoke to patient about d/c plans.[facility]([place]) will assist him w/medicines-He already has levimir boxes waiting for him to pick up.All other medicines are @ a reasonable cost(patient says he can afford)-lisinopril$4/doxycycline $10 +tax.Patient states he can afford.He has his own transp.Patient voiced understanding.Nurse updated. No further d/c needs.  08/29/14 Lanier ClamKathy Shandy Vi RN BSN NCM [phone] Spoke to patient again about d/c needs.Informed patient of 3 boxes of levimir that is sitting in the pharmacy @ [facility] will be able to provide med ast for him, & he can see their pcp.He says he may not go home on levimir.  We may have to use MATCH program if he qualifies, if not then we will try another med asst program/patient asst program through  insulin company that he will d/c on.He says he has a home to go to, & has someone to pick him up.Patient voiced understanding.  08/28/14 Lanier ClamKathy Joanathan Affeldt RN BSN NCM [phone] Spoke to patient about d/c plans. "I can't stay in a shelter".Financial counselor brought another financial packet for patient to complete.Informed him of the importance of completing all financial info needed to proceed toward getting any financial asst.TC [facility] they confirmed that patient can come there for f/u pcp appt, he has levimir 3 boxes worth still in the pharmacy.They can help him with his meds.He has used his 1x free asst from [facility],but going forward his meds are discounted drastically.CSW to f/u w/resources for housing.S/P l foot 5th ray removal. Continue to monitor progress.

## 2014-08-31 NOTE — Discharge Summary (Signed)
Patient ID: Ryan [name] MRN: [id number] DOB/AGE: 02/19/1961 54 y.o.  Admit date: 08/27/2014 Discharge date: 08/31/2014  Admission Diagnoses:  Principal Problem:   Gangrene of foot Active Problems:   Osteomyelitis   Leukocytosis   Diabetic foot   Diabetes mellitus type 2 with complications   Essential hypertension   Foot osteomyelitis, left   Discharge Diagnoses:  Same  Past Medical History  Diagnosis Date  . Hypertension   . Osteomyelitis of foot   . Heart murmur     was told "not to worry about it"  . Peripheral vascular disease     "poor circulation" in feet  . Diabetes mellitus without complication     type 2  . Diabetic neuropathy   . Arthritis   . Anemia     low iron    Surgeries: Procedure(s): Left Foot Fifth ray resection on 08/27/2014 - 08/28/2014   Consultants: Treatment Team:  Alison MurrayAlma M Devine, MD Kathryne Hitchhristopher Y Kirstina Leinweber, MD  Discharged Condition: Improved  Hospital Course: Ryan [name] is an 54 y.o. male who was admitted 08/27/2014 for operative treatment ofGangrene of foot. Patient has severe unremitting pain that affects sleep, daily activities, and work/hobbies. After pre-op clearance the patient was taken to the operating room on 08/27/2014 - 08/28/2014 and underwent  Procedure(s): Left Foot Fifth ray resection.    Patient was given perioperative antibiotics: Anti-infectives    Start     Dose/Rate Route Frequency Ordered Stop   08/30/14 1300  doxycycline (VIBRA-TABS) tablet 100 mg     100 mg Oral Every 12 hours 08/30/14 1148     08/28/14 2359  vancomycin (VANCOCIN) 1,500 mg in sodium chloride 0.9 % 500 mL IVPB  Status:  Discontinued     1,500 mg250 mL/hr over 120 Minutes Intravenous Every 12 hours 08/27/14 1743 08/28/14 0923   08/28/14 0930  vancomycin (VANCOCIN) IVPB 750 mg/150 ml premix  Status:  Discontinued     750 mg150 mL/hr over 60 Minutes Intravenous Every 8 hours 08/28/14 0923 08/30/14 1142   08/28/14 0200  piperacillin-tazobactam  (ZOSYN) IVPB 3.375 g  Status:  Discontinued     3.375 g12.5 mL/hr over 240 Minutes Intravenous Every 8 hours 08/27/14 1823 08/30/14 1142   08/27/14 1830  piperacillin-tazobactam (ZOSYN) IVPB 3.375 g  Status:  Discontinued     3.375 g12.5 mL/hr over 240 Minutes Intravenous  Once 08/27/14 1822 08/27/14 1858   08/27/14 1600  piperacillin-tazobactam (ZOSYN) IVPB 3.375 g     3.375 g12.5 mL/hr over 240 Minutes Intravenous  Once 08/27/14 1546 08/27/14 2046   08/27/14 1500  vancomycin (VANCOCIN) IVPB 1000 mg/200 mL premix     1,000 mg200 mL/hr over 60 Minutes Intravenous  Once 08/27/14 1447 08/27/14 1655       Patient was given sequential compression devices, early ambulation, and chemoprophylaxis to prevent DVT.  Patient benefited maximally from hospital stay and there were no complications.    Recent vital signs: Patient Vitals for the past 24 hrs:  BP Temp Temp src Pulse Resp SpO2 Weight  08/31/14 0442 (!) 145/70 mmHg 97.9 F (36.6 C) Oral 76 18 99 % 74.934 kg (165 lb 3.2 oz)  08/30/14 2108 (!) 150/76 mmHg 98.1 F (36.7 C) Oral 73 18 100 % -  08/30/14 1300 (!) 179/88 mmHg 97.5 F (36.4 C) Oral 74 18 100 % -     Recent laboratory studies: No results for input(s): WBC, HGB, HCT, PLT, NA, K, CL, CO2, BUN, CREATININE, GLUCOSE, INR, CALCIUM in the last  72 hours.  Invalid input(s): PT, 2   Discharge Medications:     Medication List    STOP taking these medications        oxyCODONE 5 MG immediate release tablet  Commonly known as:  Oxy IR/ROXICODONE      TAKE these medications        doxycycline 50 MG capsule  Commonly known as:  VIBRAMYCIN  Take 2 capsules (100 mg total) by mouth 2 (two) times daily.     ferrous sulfate 325 (65 FE) MG tablet  Take 1 tablet (325 mg total) by mouth 3 (three) times daily after meals.     HYDROcodone-acetaminophen 5-325 MG per tablet  Commonly known as:  NORCO  Take 1 tablet by mouth every 6 (six) hours as needed for moderate pain.     insulin  detemir 100 UNIT/ML injection  Commonly known as:  LEVEMIR  Inject 0.1 mLs (10 Units total) into the skin at bedtime.     lisinopril 20 MG tablet  Commonly known as:  PRINIVIL,ZESTRIL  Take 1 tablet (20 mg total) by mouth daily.     oxyCODONE-acetaminophen 5-325 MG per tablet  Commonly known as:  ROXICET  Take 1-2 tablets by mouth every 4 (four) hours as needed for severe pain.        Diagnostic Studies: Dg Foot Complete Left  08/27/2014   CLINICAL DATA:  Discolored fifth toe.  EXAM: LEFT FOOT - COMPLETE 3+ VIEW  COMPARISON:  January 11, 2014.  FINDINGS: Status post surgical amputation of the distal first metacarpal and more distal phalanges. Moderately displaced pathologic fracture is seen involving the distal fifth metatarsal with overlying soft tissue defect consistent with osteomyelitis.  IMPRESSION: Acute osteomyelitis of distal fifth metatarsal with overlying soft tissue wound.   Electronically Signed   By: Roque Lias M.D.   On: 08/27/2014 15:28    Disposition: 01-Home or Self Care      Discharge Instructions    Ambulatory referral to Nutrition and Diabetic Education    Complete by:  As directed      Discharge patient    Complete by:  As directed            Follow-up Information    Follow up with Kathryne Hitch, MD. Schedule an appointment as soon as possible for a visit in 1 week.   Specialty:  Orthopedic Surgery   Contact information:   [address] [place] [address] [phone]        Signed: Kathryne Hitch 08/31/2014, 7:50 AM

## 2014-08-31 NOTE — Progress Notes (Signed)
Subjective: 3 Days Post-Op Procedure(s) (LRB): Left Foot Fifth ray resection (Left) Patient reports pain as mild.    Objective: Vital signs in last 24 hours: Temp:  [97.5 F (36.4 C)-98.1 F (36.7 C)] 97.9 F (36.6 C) (02/01 0442) Pulse Rate:  [73-76] 76 (02/01 0442) Resp:  [18] 18 (02/01 0442) BP: (145-179)/(70-88) 145/70 mmHg (02/01 0442) SpO2:  [99 %-100 %] 99 % (02/01 0442) Weight:  [74.934 kg (165 lb 3.2 oz)] 74.934 kg (165 lb 3.2 oz) (02/01 0442)  Intake/Output from previous day: 01/31 0701 - 02/01 0700 In: 360 [P.O.:360] Out: -  Intake/Output this shift:    No results for input(s): HGB in the last 72 hours. No results for input(s): WBC, RBC, HCT, PLT in the last 72 hours. No results for input(s): NA, K, CL, CO2, BUN, CREATININE, GLUCOSE, CALCIUM in the last 72 hours. No results for input(s): LABPT, INR in the last 72 hours.  Incision: dressing C/D/I  Assessment/Plan: 3 Days Post-Op Procedure(s) (LRB): Left Foot Fifth ray resection (Left) Discharge to home today.  Melvia Matousek Y 08/31/2014, 7:48 AM

## 2014-08-31 NOTE — Progress Notes (Signed)
[facility] PROGRESS NOTE  Ryan [name] BJY:[id number] DOB: 1960-10-23 DOA: 08/27/2014 PCP: Jeanann Lewandowsky, MD  Assessment/Plan: 1. Left foot gangrene/ostomyelitus, s/p left foot fifth ray amputation-orthopedics following. Will d/c the IV antibiotics as per ortho recommendation. Will start Doxycycline 100 mg po bid. 2. Diabetes mellitus- will check hemoglobin A1c, A1c 9.6, last A1c from November 2015 was 7.0. Patient started on sliding-scale insulin with NovoLog. Blood glucose is well controlled in the hospital. Patient takes Levemir at home and says that he never got extra supplies from the company as he was told. Case management discussed the financial issues and getting Levemir as outpatient. Patient can go to the [facility] health clinic and get the levemir today. 3. Hypertension- patient is nothing by mouth, started on metoprolol 2.5 mg IV every 6 hours. Also started when necessary hydralazine. Blood pressure has remained stable. Will be restarted on lisinopril 20 mg po daily on discharge. 4. DVT prophylaxis- SCDs     *Code Status: Full code     Antibiotics:  Vancomycin  Zosyn  HPI/Subjective: 54 year old male with a history of worsening left lateral foot swelling, drainage in order presented to the hospital yesterday for worsening appearance of the fourth toe turning to purple color. Patient was diagnosed with acute ostieomyelitus and was seen by orthopedic surgery. IV antibiotics have been started. Patient underwent left foot fifth ray amputation. Denies any pain. Blood glucose is well controlled. Objective: Filed Vitals:   08/31/14 0833  BP: 174/73  Pulse: 82  Temp: 98.7 F (37.1 C)  Resp: 16    Intake/Output Summary (Last 24 hours) at 08/31/14 0945 Last data filed at 08/31/14 0745  Gross per 24 hour  Intake    600 ml  Output      0 ml  Net    600 ml   Filed Weights   08/29/14 0454 08/30/14 0602 08/31/14 0442  Weight: 76.2 kg (167 lb 15.9 oz) 76.703 kg  (169 lb 1.6 oz) 74.934 kg (165 lb 3.2 oz)    Exam:  Physical Exam: Eyes: No icterus, extraocular muscles intact  Lungs: Normal respiratory effort, bilateral clear to auscultation, no crackles or wheezes.  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 No results for input(s): AMMONIA in the last 168 hours. CBC:  Recent Labs Lab 08/27/14 1454 08/28/14 0535  WBC 16.6* 11.5*  HGB 12.6* 10.6*  HCT 37.4* 31.8*  MCV 89.5 90.6  PLT 266 235   Cardiac Enzymes: No results for input(s): CKTOTAL, CKMB, CKMBINDEX, TROPONINI in the last 168 hours. BNP (last 3 results)  Recent Labs  01/11/14 1855  PROBNP 148.5*   CBG:  Recent Labs Lab 08/29/14 2145 08/30/14 0720 08/30/14 1208 08/30/14 1715 08/30/14 2105  GLUCAP 149* 117* 198* 158* 142*  Recent Results (from the past 240 hour(s))  Culture, blood (routine x 2)     Status: None (Preliminary result)   Collection Time: 08/27/14 11:02 PM  Result Value Ref Range Status   Specimen Description BLOOD LEFT ARM  Final   Special Requests BOTTLES DRAWN AEROBIC AND ANAEROBIC 10CC  Final   Culture   Final           BLOOD CULTURE RECEIVED NO GROWTH TO DATE CULTURE WILL BE HELD FOR 5 DAYS BEFORE ISSUING A FINAL NEGATIVE REPORT Note: Culture results may be  compromised due to an excessive volume of blood received in culture bottles. Performed at [facility]    Report Status PENDING  Incomplete  Culture, blood (routine x 2)     Status: None (Preliminary result)   Collection Time: 08/27/14 11:07 PM  Result Value Ref Range Status   Specimen Description BLOOD LEFT HAND  Final   Special Requests BOTTLES DRAWN AEROBIC AND ANAEROBIC 10CC  Final   Culture   Final           BLOOD CULTURE RECEIVED NO GROWTH TO DATE CULTURE WILL BE HELD FOR 5 DAYS BEFORE ISSUING A FINAL NEGATIVE REPORT Note: Culture results may be compromised due to an excessive volume of blood received in culture bottles. Performed at [facility]    Report Status PENDING  Incomplete  Surgical pcr screen     Status: None   Collection Time: 08/28/14 11:17 AM  Result Value Ref Range Status   MRSA, PCR NEGATIVE NEGATIVE Final   Staphylococcus aureus NEGATIVE NEGATIVE Final    Comment:        The Xpert SA Assay (FDA approved for NASAL specimens in patients over 52 years of age), is one component of a comprehensive surveillance program.  Test performance has been validated by [facility] for patients greater than or equal to 52 year old. It is not intended to diagnose infection nor to guide or monitor treatment.      Studies: No results found.  Scheduled Meds: . docusate sodium  100 mg Oral BID  . doxycycline  100 mg Oral Q12H  . insulin aspart  0-20 Units Subcutaneous TID WC  . insulin aspart  0-5 Units Subcutaneous QHS  . insulin detemir  10 Units Subcutaneous QAC breakfast  . metoprolol  2.5 mg Intravenous 4 times per day  . metoprolol  5 mg Intravenous Once  . sodium chloride  3 mL Intravenous Q12H   Continuous Infusions:    Principal Problem:   Gangrene of foot Active Problems:   Osteomyelitis   Leukocytosis   Diabetic foot   Diabetes mellitus type 2 with complications   Essential hypertension   Foot osteomyelitis, left    Time  spent: 25 min    [facility],Rommie Dunn S  [facility] Pager [phone]*. If 7PM-7AM, please contact night-coverage at [email or web address], password [email or web address] 08/31/2014, 9:45 AM  LOS: 4 days

## 2014-08-31 NOTE — Progress Notes (Signed)
Patient has been discharged. Dr. Sharl MaLama rounded and agreed patient is ready for discharge, Dr. Magnus IvanBlackman placed order to discharge patient this morning. Prescription and discharge instructions reviewed with patient with no further questions.

## 2014-09-03 LAB — CULTURE, BLOOD (ROUTINE X 2)
Culture: NO GROWTH
Culture: NO GROWTH

## 2014-09-10 ENCOUNTER — Encounter: Payer: Self-pay | Admitting: Internal Medicine

## 2014-09-10 ENCOUNTER — Ambulatory Visit: Payer: Medicaid Other | Attending: Internal Medicine | Admitting: Internal Medicine

## 2014-09-10 VITALS — BP 165/80 | HR 80 | Temp 98.0°F | Resp 16 | Ht 71.0 in | Wt 170.0 lb

## 2014-09-10 DIAGNOSIS — M869 Osteomyelitis, unspecified: Secondary | ICD-10-CM | POA: Diagnosis not present

## 2014-09-10 DIAGNOSIS — I10 Essential (primary) hypertension: Secondary | ICD-10-CM | POA: Diagnosis not present

## 2014-09-10 DIAGNOSIS — I739 Peripheral vascular disease, unspecified: Secondary | ICD-10-CM | POA: Insufficient documentation

## 2014-09-10 DIAGNOSIS — Z89412 Acquired absence of left great toe: Secondary | ICD-10-CM | POA: Diagnosis not present

## 2014-09-10 DIAGNOSIS — Z89422 Acquired absence of other left toe(s): Secondary | ICD-10-CM | POA: Diagnosis not present

## 2014-09-10 DIAGNOSIS — Z792 Long term (current) use of antibiotics: Secondary | ICD-10-CM | POA: Diagnosis not present

## 2014-09-10 DIAGNOSIS — E1140 Type 2 diabetes mellitus with diabetic neuropathy, unspecified: Secondary | ICD-10-CM | POA: Diagnosis not present

## 2014-09-10 DIAGNOSIS — E11649 Type 2 diabetes mellitus with hypoglycemia without coma: Secondary | ICD-10-CM | POA: Insufficient documentation

## 2014-09-10 DIAGNOSIS — E119 Type 2 diabetes mellitus without complications: Secondary | ICD-10-CM

## 2014-09-10 LAB — GLUCOSE, POCT (MANUAL RESULT ENTRY): POC GLUCOSE: 293 mg/dL — AB (ref 70–99)

## 2014-09-10 MED ORDER — INSULIN DETEMIR 100 UNIT/ML ~~LOC~~ SOLN: 15.0000 [IU] | Freq: Every day | SUBCUTANEOUS | Status: DC

## 2014-09-10 MED ORDER — INSULIN ASPART 100 UNIT/ML ~~LOC~~ SOLN
10.0000 [IU] | Freq: Once | SUBCUTANEOUS | Status: AC
  Administered 2014-09-10: 10 [IU] via SUBCUTANEOUS

## 2014-09-10 MED ORDER — LISINOPRIL 40 MG PO TABS: 40.0000 mg | ORAL_TABLET | Freq: Every day | ORAL | Status: DC

## 2014-09-10 NOTE — Patient Instructions (Signed)
Hypertension Hypertension, commonly called high blood pressure, is when the force of blood pumping through your arteries is too strong. Your arteries are the blood vessels that carry blood from your heart throughout your body. A blood pressure reading consists of a higher number over a lower number, such as 110/72. The higher number (systolic) is the pressure inside your arteries when your heart pumps. The lower number (diastolic) is the pressure inside your arteries when your heart relaxes. Ideally you want your blood pressure below 120/80. Hypertension forces your heart to work harder to pump blood. Your arteries may become narrow or stiff. Having hypertension puts you at risk for heart disease, stroke, and other problems.  RISK FACTORS Some risk factors for high blood pressure are controllable. Others are not.  Risk factors you cannot control include:   Race. You may be at higher risk if you are African American.  Age. Risk increases with age.  Gender. Men are at higher risk than women before age 45 years. After age 65, women are at higher risk than men. Risk factors you can control include:  Not getting enough exercise or physical activity.  Being overweight.  Getting too much fat, sugar, calories, or salt in your diet.  Drinking too much alcohol. SIGNS AND SYMPTOMS Hypertension does not usually cause signs or symptoms. Extremely high blood pressure (hypertensive crisis) may cause headache, anxiety, shortness of breath, and nosebleed. DIAGNOSIS  To check if you have hypertension, your health care provider will measure your blood pressure while you are seated, with your arm held at the level of your heart. It should be measured at least twice using the same arm. Certain conditions can cause a difference in blood pressure between your right and left arms. A blood pressure reading that is higher than normal on one occasion does not mean that you need treatment. If one blood pressure reading  is high, ask your health care provider about having it checked again. TREATMENT  Treating high blood pressure includes making lifestyle changes and possibly taking medicine. Living a healthy lifestyle can help lower high blood pressure. You may need to change some of your habits. Lifestyle changes may include:  Following the DASH diet. This diet is high in fruits, vegetables, and whole grains. It is low in salt, red meat, and added sugars.  Getting at least 2 hours of brisk physical activity every week.  Losing weight if necessary.  Not smoking.  Limiting alcoholic beverages.  Learning ways to reduce stress. If lifestyle changes are not enough to get your blood pressure under control, your health care provider may prescribe medicine. You may need to take more than one. Work closely with your health care provider to understand the risks and benefits. HOME CARE INSTRUCTIONS  Have your blood pressure rechecked as directed by your health care provider.   Take medicines only as directed by your health care provider. Follow the directions carefully. Blood pressure medicines must be taken as prescribed. The medicine does not work as well when you skip doses. Skipping doses also puts you at risk for problems.   Do not smoke.   Monitor your blood pressure at home as directed by your health care provider. SEEK MEDICAL CARE IF:   You think you are having a reaction to medicines taken.  You have recurrent headaches or feel dizzy.  You have swelling in your ankles.  You have trouble with your vision. SEEK IMMEDIATE MEDICAL CARE IF:  You develop a severe headache or confusion.    You have unusual weakness, numbness, or feel faint.  You have severe chest or abdominal pain.  You vomit repeatedly.  You have trouble breathing. MAKE SURE YOU:   Understand these instructions.  Will watch your condition.  Will get help right away if you are not doing well or get worse. Document  Released: 07/17/2005 Document Revised: 12/01/2013 Document Reviewed: 05/09/2013 ExitCare Patient Information 2015 [employer]. This information is not intended to replace advice given to you by your health care provider. Make sure you discuss any questions you have with your health care provider. DASH Eating Plan DASH stands for "Dietary Approaches to Stop Hypertension." The DASH eating plan is a healthy eating plan that has been shown to reduce high blood pressure (hypertension). Additional health benefits may include reducing the risk of type 2 diabetes mellitus, heart disease, and stroke. The DASH eating plan may also help with weight loss. WHAT DO I NEED TO KNOW ABOUT THE DASH EATING PLAN? For the DASH eating plan, you will follow these general guidelines:  Choose foods with a percent daily value for sodium of less than 5% (as listed on the food label).  Use salt-free seasonings or herbs instead of table salt or sea salt.  Check with your health care provider or pharmacist before using salt substitutes.  Eat lower-sodium products, often labeled as "lower sodium" or "no salt added."  Eat fresh foods.  Eat more vegetables, fruits, and low-fat dairy products.  Choose whole grains. Look for the word "whole" as the first word in the ingredient list.  Choose fish and skinless chicken or turkey more often than red meat. Limit fish, poultry, and meat to 6 oz (170 g) each day.  Limit sweets, desserts, sugars, and sugary drinks.  Choose heart-healthy fats.  Limit cheese to 1 oz (28 g) per day.  Eat more home-cooked food and less restaurant, buffet, and fast food.  Limit fried foods.  Cook foods using methods other than frying.  Limit canned vegetables. If you do use them, rinse them well to decrease the sodium.  When eating at a restaurant, ask that your food be prepared with less salt, or no salt if possible. WHAT FOODS CAN I EAT? Seek help from a dietitian for individual  calorie needs. Grains Whole grain or whole wheat bread. Brown rice. Whole grain or whole wheat pasta. Quinoa, bulgur, and whole grain cereals. Low-sodium cereals. Corn or whole wheat flour tortillas. Whole grain cornbread. Whole grain crackers. Low-sodium crackers. Vegetables Fresh or frozen vegetables (raw, steamed, roasted, or grilled). Low-sodium or reduced-sodium tomato and vegetable juices. Low-sodium or reduced-sodium tomato sauce and paste. Low-sodium or reduced-sodium canned vegetables.  Fruits All fresh, canned (in natural juice), or frozen fruits. Meat and Other Protein Products Ground beef (85% or leaner), grass-fed beef, or beef trimmed of fat. Skinless chicken or turkey. Ground chicken or turkey. Pork trimmed of fat. All fish and seafood. Eggs. Dried beans, peas, or lentils. Unsalted nuts and seeds. Unsalted canned beans. Dairy Low-fat dairy products, such as skim or 1% milk, 2% or reduced-fat cheeses, low-fat ricotta or cottage cheese, or plain low-fat yogurt. Low-sodium or reduced-sodium cheeses. Fats and Oils Tub margarines without trans fats. Light or reduced-fat mayonnaise and salad dressings (reduced sodium). Avocado. Safflower, olive, or canola oils. Natural peanut or almond butter. Other Unsalted popcorn and pretzels. The items listed above may not be a complete list of recommended foods or beverages. Contact your dietitian for more options. WHAT FOODS ARE NOT RECOMMENDED? Grains White bread.   White pasta. White rice. Refined cornbread. Bagels and croissants. Crackers that contain trans fat. Vegetables Creamed or fried vegetables. Vegetables in a cheese sauce. Regular canned vegetables. Regular canned tomato sauce and paste. Regular tomato and vegetable juices. Fruits Dried fruits. Canned fruit in light or heavy syrup. Fruit juice. Meat and Other Protein Products Fatty cuts of meat. Ribs, chicken wings, bacon, sausage, bologna, salami, chitterlings, fatback, hot dogs,  bratwurst, and packaged luncheon meats. Salted nuts and seeds. Canned beans with salt. Dairy Whole or 2% milk, cream, half-and-half, and cream cheese. Whole-fat or sweetened yogurt. Full-fat cheeses or blue cheese. Nondairy creamers and whipped toppings. Processed cheese, cheese spreads, or cheese curds. Condiments Onion and garlic salt, seasoned salt, table salt, and sea salt. Canned and packaged gravies. Worcestershire sauce. Tartar sauce. Barbecue sauce. Teriyaki sauce. Soy sauce, including reduced sodium. Steak sauce. Fish sauce. Oyster sauce. Cocktail sauce. Horseradish. Ketchup and mustard. Meat flavorings and tenderizers. Bouillon cubes. Hot sauce. Tabasco sauce. Marinades. Taco seasonings. Relishes. Fats and Oils Butter, stick margarine, lard, shortening, ghee, and bacon fat. Coconut, palm kernel, or palm oils. Regular salad dressings. Other Pickles and olives. Salted popcorn and pretzels. The items listed above may not be a complete list of foods and beverages to avoid. Contact your dietitian for more information. WHERE CAN I FIND MORE INFORMATION? National Heart, Lung, and [employer] Institute: www.nhlbi.nih.gov/health/health-topics/topics/dash/ Document Released: 07/06/2011 Document Revised: 12/01/2013 Document Reviewed: 05/21/2013 ExitCare Patient Information 2015 [employer]. This information is not intended to replace advice given to you by your health care provider. Make sure you discuss any questions you have with your health care provider. Diabetes and Exercise Exercising regularly is important. It is not just about losing weight. It has many health benefits, such as:  Improving your overall fitness, flexibility, and endurance.  Increasing your bone density.  Helping with weight control.  Decreasing your body fat.  Increasing your muscle strength.  Reducing stress and tension.  Improving your overall health. People with diabetes who exercise gain additional benefits  because exercise:  Reduces appetite.  Improves the body's use of blood sugar (glucose).  Helps lower or control blood glucose.  Decreases blood pressure.  Helps control blood lipids (such as cholesterol and triglycerides).  Improves the body's use of the hormone insulin by:  Increasing the body's insulin sensitivity.  Reducing the body's insulin needs.  Decreases the risk for heart disease because exercising:  Lowers cholesterol and triglycerides levels.  Increases the levels of good cholesterol (such as high-density lipoproteins [HDL]) in the body.  Lowers blood glucose levels. YOUR ACTIVITY PLAN  Choose an activity that you enjoy and set realistic goals. Your health care provider or diabetes educator can help you make an activity plan that works for you. Exercise regularly as directed by your health care provider. This includes:  Performing resistance training twice a week such as push-ups, sit-ups, lifting weights, or using resistance bands.  Performing 150 minutes of cardio exercises each week such as walking, running, or playing sports.  Staying active and spending no more than 90 minutes at one time being inactive. Even short bursts of exercise are good for you. Three 10-minute sessions spread throughout the day are just as beneficial as a single 30-minute session. Some exercise ideas include:  Taking the dog for a walk.  Taking the stairs instead of the elevator.  Dancing to your favorite song.  Doing an exercise video.  Doing your favorite exercise with a friend. RECOMMENDATIONS FOR EXERCISING WITH TYPE 1 OR TYPE   2 DIABETES   Check your blood glucose before exercising. If blood glucose levels are greater than 240 mg/dL, check for urine ketones. Do not exercise if ketones are present.  Avoid injecting insulin into areas of the body that are going to be exercised. For example, avoid injecting insulin into:  The arms when playing tennis.  The legs when  jogging.  Keep a record of:  Food intake before and after you exercise.  Expected peak times of insulin action.  Blood glucose levels before and after you exercise.  The type and amount of exercise you have done.  Review your records with your health care provider. Your health care provider will help you to develop guidelines for adjusting food intake and insulin amounts before and after exercising.  If you take insulin or oral hypoglycemic agents, watch for signs and symptoms of hypoglycemia. They include:  Dizziness.  Shaking.  Sweating.  Chills.  Confusion.  Drink plenty of water while you exercise to prevent dehydration or heat stroke. Body water is lost during exercise and must be replaced.  Talk to your health care provider before starting an exercise program to make sure it is safe for you. Remember, almost any type of activity is better than none. Document Released: 10/07/2003 Document Revised: 12/01/2013 Document Reviewed: 12/24/2012 ExitCare Patient Information 2015 [employer]. This information is not intended to replace advice given to you by your health care provider. Make sure you discuss any questions you have with your health care provider. Basic Carbohydrate Counting for Diabetes Mellitus Carbohydrate counting is a method for keeping track of the amount of carbohydrates you eat. Eating carbohydrates naturally increases the level of sugar (glucose) in your blood, so it is important for you to know the amount that is okay for you to have in every meal. Carbohydrate counting helps keep the level of glucose in your blood within normal limits. The amount of carbohydrates allowed is different for every person. A dietitian can help you calculate the amount that is right for you. Once you know the amount of carbohydrates you can have, you can count the carbohydrates in the foods you want to eat. Carbohydrates are found in the following foods:  Grains, such as breads  and cereals.  Dried beans and soy products.  Starchy vegetables, such as potatoes, peas, and corn.  Fruit and fruit juices.  Milk and yogurt.  Sweets and snack foods, such as cake, cookies, candy, chips, soft drinks, and fruit drinks. CARBOHYDRATE COUNTING There are two ways to count the carbohydrates in your food. You can use either of the methods or a combination of both. Reading the "Nutrition Facts" on Packaged Food The "Nutrition Facts" is an area that is included on the labels of almost all packaged food and beverages in the [place]. It includes the serving size of that food or beverage and information about the nutrients in each serving of the food, including the grams (g) of carbohydrate per serving.  Decide the number of servings of this food or beverage that you will be able to eat or drink. Multiply that number of servings by the number of grams of carbohydrate that is listed on the label for that serving. The total will be the amount of carbohydrates you will be having when you eat or drink this food or beverage. Learning Standard Serving Sizes of Food When you eat food that is not packaged or does not include "Nutrition Facts" on the label, you need to measure the servings   in order to count the amount of carbohydrates.A serving of most carbohydrate-rich foods contains about 15 g of carbohydrates. The following list includes serving sizes of carbohydrate-rich foods that provide 15 g ofcarbohydrate per serving:   1 slice of bread (1 oz) or 1 six-inch tortilla.    of a hamburger bun or English muffin.  4-6 crackers.   cup unsweetened dry cereal.    cup hot cereal.   cup rice or pasta.    cup mashed potatoes or  of a large baked potato.  1 cup fresh fruit or one small piece of fruit.    cup canned or frozen fruit or fruit juice.  1 cup milk.   cup plain fat-free yogurt or yogurt sweetened with artificial sweeteners.   cup cooked dried beans or  starchy vegetable, such as peas, corn, or potatoes.  Decide the number of standard-size servings that you will eat. Multiply that number of servings by 15 (the grams of carbohydrates in that serving). For example, if you eat 2 cups of strawberries, you will have eaten 2 servings and 30 g of carbohydrates (2 servings x 15 g = 30 g). For foods such as soups and casseroles, in which more than one food is mixed in, you will need to count the carbohydrates in each food that is included. EXAMPLE OF CARBOHYDRATE COUNTING Sample Dinner  3 oz chicken breast.   cup of brown rice.   cup of corn.  1 cup milk.   1 cup strawberries with sugar-free whipped topping.  Carbohydrate Calculation Step 1: Identify the foods that contain carbohydrates:   Rice.   Corn.   Milk.   Strawberries. Step 2:Calculate the number of servings eaten of each:   2 servings of rice.   1 serving of corn.   1 serving of milk.   1 serving of strawberries. Step 3: Multiply each of those number of servings by 15 g:   2 servings of rice x 15 g = 30 g.   1 serving of corn x 15 g = 15 g.   1 serving of milk x 15 g = 15 g.   1 serving of strawberries x 15 g = 15 g. Step 4: Add together all of the amounts to find the total grams of carbohydrates eaten: 30 g + 15 g + 15 g + 15 g = 75 g. Document Released: 07/17/2005 Document Revised: 12/01/2013 Document Reviewed: 06/13/2013 ExitCare Patient Information 2015 [employer]. This information is not intended to replace advice given to you by your health care provider. Make sure you discuss any questions you have with your health care provider.  

## 2014-09-10 NOTE — Progress Notes (Signed)
Pt is here following up on his HTN and diabetes. Pt recently had his 5th toe on his left foot amputated and he is having some pain at the surgical site.

## 2014-09-10 NOTE — Progress Notes (Signed)
Patient ID: Ryan [name], male   DOB: [birth year], 54 y.o.   MRN: [id number]   Ryan [name], is a 54 y.o. male  [id number]  [id number]  DOB - [birth year]  Chief Complaint  Patient presents with  . Follow-up        Subjective:   Ryan [name] is a 54 y.o. male here today for a follow up visit. Patient has history of hypertension, osteomyelitis of the left foot, peripheral vascular disease, diabetes mellitus with diabetic neuropathic pain was recently at admitted for left foot infection post surgery/amputation of first and fifth toes. Patient was managed with antibiotics and discharged home to be followed up in our clinic. He is doing well since discharge but blood sugar has not been well controlled with blood sugar persistently above 200. Patient's blood pressure has not been controlled as well, persistently above 160 systolic. He has no complaints today. He would need refill on some medications. His blood glucose today is 293, he is on 10 units of Levemir insulin. His metformin was stopped because of persistent hypoglycemia. He is on lisinopril 20 mg tablet by mouth daily for hypertension. Patient does not smoke cigarette, he does not drink alcohol. Patient has No headache, No chest pain, No abdominal pain - No Nausea, No new weakness tingling or numbness, No Cough - SOB.  No problems updated.  ALLERGIES: Allergies  Allergen Reactions  . Metformin And Related Other (See Comments)    Pt states that metformin made sugars fall into 30's    PAST MEDICAL HISTORY: Past Medical History  Diagnosis Date  . Hypertension   . Osteomyelitis of foot   . Heart murmur     was told "not to worry about it"  . Peripheral vascular disease     "poor circulation" in feet  . Diabetes mellitus without complication     type 2  . Diabetic neuropathy   . Arthritis   . Anemia     low iron    MEDICATIONS AT HOME: Prior to Admission medications   Medication Sig Start Date End Date Taking?  Authorizing Provider  doxycycline (VIBRAMYCIN) 50 MG capsule Take 2 capsules (100 mg total) by mouth 2 (two) times daily. 07/10/14  Yes Donnie Coffinhomas B Dixon, PA-C  insulin detemir (LEVEMIR) 100 UNIT/ML injection Inject 0.15 mLs (15 Units total) into the skin at bedtime. 09/10/14  Yes Quentin Angstlugbemiga E Carmela Piechowski, MD  lisinopril (PRINIVIL,ZESTRIL) 40 MG tablet Take 1 tablet (40 mg total) by mouth daily. 09/10/14  Yes Quentin Angstlugbemiga E Dalyn Kjos, MD  ferrous sulfate 325 (65 FE) MG tablet Take 1 tablet (325 mg total) by mouth 3 (three) times daily after meals. Patient not taking: Reported on 07/01/2014 01/15/14   Leatha Gildingostin M Gherghe, MD  HYDROcodone-acetaminophen (NORCO) 5-325 MG per tablet Take 1 tablet by mouth every 6 (six) hours as needed for moderate pain. Patient not taking: Reported on 09/10/2014 08/29/14   Kathryne Hitchhristopher Y Blackman, MD  oxyCODONE-acetaminophen (ROXICET) 5-325 MG per tablet Take 1-2 tablets by mouth every 4 (four) hours as needed for severe pain. Patient not taking: Reported on 08/27/2014 07/02/14   Verlee RossettiSteven R Norris, MD     Objective:   Filed Vitals:   09/10/14 0946  BP: 165/80  Pulse: 80  Temp: 98 F (36.7 C)  TempSrc: Oral  Resp: 16  Height: 5\' 11"  (1.803 m)  Weight: 170 lb (77.111 kg)  SpO2: 99%    Exam General appearance : Awake, alert, not in any distress. Speech Clear. Not toxic looking HEENT:  Atraumatic and Normocephalic, pupils equally reactive to light and accomodation Neck: supple, no JVD. No cervical lymphadenopathy.  Chest:Good air entry bilaterally, no added sounds  CVS: S1 S2 regular, no murmurs.  Abdomen: Bowel sounds present, Non tender and not distended with no gaurding, rigidity or rebound. Extremities: B/L Lower Ext shows no edema, both legs are warm to touch Neurology: Awake alert, and oriented X 3, CN II-XII intact, Non focal Skin:No Rash Wounds:N/A  Data Review Lab Results  Component Value Date   HGBA1C 9.6* 08/28/2014   HGBA1C 9.6* 08/27/2014   HGBA1C 7.0*  06/08/2014     Assessment & Plan   1. Type 2 diabetes mellitus without complication: Uncontrolled  - Glucose (CBG) - insulin aspart (novoLOG) injection 10 Units; Inject 0.1 mLs (10 Units total) into the skin once.  Increase insulin Levemir to 15 units from 10 units subcutaneous  - insulin detemir (LEVEMIR) 100 UNIT/ML injection; Inject 0.15 mLs (15 Units total) into the skin at bedtime.  Dispense: 4 vial; Refill: 3  - Ambulatory referral to Ophthalmology for routine diabetic retinopathy screening   Aim for 2-3 Carb Choices per meal (30-45 grams) +/- 1 either way  Aim for 0-15 Carbs per snack if hungry  Include protein in moderation with your meals and snacks  Consider reading food labels for Total Carbohydrate and Fat Grams of foods  Consider checking BG at alternate times per day  Continue taking medication as directed Fruit Punch - find one with no sugar  Measure and decrease portions of carbohydrate foods  Make your plate and don't go back for seconds   2. Essential hypertension: Uncontrolled  Increase lisinopril to 40 mg tablet by mouth daily from 20 mg tablet by mouth daily  - lisinopril (PRINIVIL,ZESTRIL) 40 MG tablet; Take 1 tablet (40 mg total) by mouth daily.  Dispense: 90 tablet; Refill: 3   Patient was counseled extensively about nutrition and exercise   Return in about 4 weeks (around 10/08/2014), or if symptoms worsen or fail to improve, for BP Check, Nurse Visit.  The patient was given clear instructions to go to ER or return to medical center if symptoms don't improve, worsen or new problems develop. The patient verbalized understanding. The patient was told to call to get lab results if they haven't heard anything in the next week.   This note has been created with Education officer, environmental. Any transcriptional errors are unintentional.    Jeanann Lewandowsky, MD, MHA, FACP, FAAP [facility]  [facility] [place], [place] [phone]   09/10/2014, 10:38 AM

## 2014-10-05 ENCOUNTER — Other Ambulatory Visit (HOSPITAL_COMMUNITY): Payer: Self-pay | Admitting: Orthopaedic Surgery

## 2014-10-07 ENCOUNTER — Encounter (HOSPITAL_COMMUNITY): Payer: Self-pay | Admitting: *Deleted

## 2014-10-07 MED ORDER — VANCOMYCIN HCL IN DEXTROSE 1-5 GM/200ML-% IV SOLN
1000.0000 mg | INTRAVENOUS | Status: AC
  Administered 2014-10-08: 1000 mg via INTRAVENOUS
  Filled 2014-10-07: qty 200

## 2014-10-07 NOTE — Progress Notes (Signed)
Pt made aware to not take any diabetic medication the morning of procedure.

## 2014-10-08 ENCOUNTER — Encounter (HOSPITAL_COMMUNITY): Payer: Self-pay | Admitting: *Deleted

## 2014-10-08 ENCOUNTER — Encounter (HOSPITAL_COMMUNITY)
Admission: RE | Disposition: A | Discharge status: Home / Self Care | Payer: Self-pay | Source: Ambulatory Visit | Attending: Orthopaedic Surgery

## 2014-10-08 ENCOUNTER — Inpatient Hospital Stay (HOSPITAL_COMMUNITY): Payer: Medicaid Other | Admitting: Anesthesiology

## 2014-10-08 ENCOUNTER — Inpatient Hospital Stay (HOSPITAL_COMMUNITY)
Admission: RE | Admit: 2014-10-08 | Discharge: 2014-10-09 | DRG: 240 | Disposition: A | Discharge status: Home / Self Care | Payer: Medicaid Other | Source: Ambulatory Visit | Attending: Orthopaedic Surgery | Admitting: Orthopaedic Surgery

## 2014-10-08 DIAGNOSIS — M1990 Unspecified osteoarthritis, unspecified site: Secondary | ICD-10-CM | POA: Diagnosis present

## 2014-10-08 DIAGNOSIS — Z794 Long term (current) use of insulin: Secondary | ICD-10-CM | POA: Diagnosis not present

## 2014-10-08 DIAGNOSIS — E1151 Type 2 diabetes mellitus with diabetic peripheral angiopathy without gangrene: Principal | ICD-10-CM | POA: Diagnosis present

## 2014-10-08 DIAGNOSIS — E11621 Type 2 diabetes mellitus with foot ulcer: Secondary | ICD-10-CM | POA: Diagnosis present

## 2014-10-08 DIAGNOSIS — M869 Osteomyelitis, unspecified: Secondary | ICD-10-CM | POA: Diagnosis present

## 2014-10-08 DIAGNOSIS — Z89412 Acquired absence of left great toe: Secondary | ICD-10-CM

## 2014-10-08 DIAGNOSIS — I10 Essential (primary) hypertension: Secondary | ICD-10-CM | POA: Diagnosis present

## 2014-10-08 DIAGNOSIS — F17220 Nicotine dependence, chewing tobacco, uncomplicated: Secondary | ICD-10-CM | POA: Diagnosis present

## 2014-10-08 DIAGNOSIS — Z89411 Acquired absence of right great toe: Secondary | ICD-10-CM

## 2014-10-08 DIAGNOSIS — E1140 Type 2 diabetes mellitus with diabetic neuropathy, unspecified: Secondary | ICD-10-CM | POA: Diagnosis present

## 2014-10-08 DIAGNOSIS — S91302A Unspecified open wound, left foot, initial encounter: Secondary | ICD-10-CM | POA: Diagnosis present

## 2014-10-08 DIAGNOSIS — Z89439 Acquired absence of unspecified foot: Secondary | ICD-10-CM

## 2014-10-08 DIAGNOSIS — Z79899 Other long term (current) drug therapy: Secondary | ICD-10-CM | POA: Diagnosis not present

## 2014-10-08 HISTORY — PX: AMPUTATION: SHX166

## 2014-10-08 LAB — CBC
HCT: 35.4 % — ABNORMAL LOW (ref 39.0–52.0)
Hemoglobin: 11.5 g/dL — ABNORMAL LOW (ref 13.0–17.0)
MCH: 29.8 pg (ref 26.0–34.0)
MCHC: 32.5 g/dL (ref 30.0–36.0)
MCV: 91.7 fL (ref 78.0–100.0)
PLATELETS: 346 10*3/uL (ref 150–400)
RBC: 3.86 MIL/uL — AB (ref 4.22–5.81)
RDW: 13.2 % (ref 11.5–15.5)
WBC: 13.7 10*3/uL — AB (ref 4.0–10.5)

## 2014-10-08 LAB — BASIC METABOLIC PANEL
Anion gap: 6 (ref 5–15)
BUN: 9 mg/dL (ref 6–23)
CALCIUM: 8.6 mg/dL (ref 8.4–10.5)
CO2: 26 mmol/L (ref 19–32)
Chloride: 101 mmol/L (ref 96–112)
Creatinine, Ser: 0.95 mg/dL (ref 0.50–1.35)
GFR calc Af Amer: >90 mL/min (ref >90)
GFR calc non Af Amer: >90 mL/min (ref >90)
Glucose, Bld: 130 mg/dL — ABNORMAL HIGH (ref 70–99)
Potassium: 3.5 mmol/L (ref 3.5–5.1)
Sodium: 133 mmol/L — ABNORMAL LOW (ref 135–145)

## 2014-10-08 LAB — GLUCOSE, CAPILLARY
GLUCOSE-CAPILLARY: 122 mg/dL — AB (ref 70–99)
GLUCOSE-CAPILLARY: 152 mg/dL — AB (ref 70–99)
Glucose-Capillary: 111 mg/dL — ABNORMAL HIGH (ref 70–99)
Glucose-Capillary: 140 mg/dL — ABNORMAL HIGH (ref 70–99)
Glucose-Capillary: 149 mg/dL — ABNORMAL HIGH (ref 70–99)
Glucose-Capillary: 108 mg/dL — ABNORMAL HIGH (ref 70–99)

## 2014-10-08 SURGERY — AMPUTATION, FOOT, PARTIAL
Anesthesia: General | Site: Foot | Laterality: Left

## 2014-10-08 MED ORDER — METHOCARBAMOL 1000 MG/10ML IJ SOLN
500.0000 mg | Freq: Four times a day (QID) | INTRAVENOUS | Status: DC | PRN
  Filled 2014-10-08: qty 5

## 2014-10-08 MED ORDER — INSULIN ASPART 100 UNIT/ML ~~LOC~~ SOLN: 0.0000 [IU] | Freq: Every day | SUBCUTANEOUS | Status: DC

## 2014-10-08 MED ORDER — PROMETHAZINE HCL 25 MG/ML IJ SOLN: 6.2500 mg | INTRAMUSCULAR | Status: DC | PRN

## 2014-10-08 MED ORDER — LIDOCAINE HCL (CARDIAC) 20 MG/ML IV SOLN
INTRAVENOUS | Status: DC | PRN
  Administered 2014-10-08: 60 mg via INTRAVENOUS

## 2014-10-08 MED ORDER — ONDANSETRON HCL 4 MG/2ML IJ SOLN
INTRAMUSCULAR | Status: DC | PRN
  Administered 2014-10-08: 4 mg via INTRAVENOUS

## 2014-10-08 MED ORDER — OXYCODONE HCL 5 MG/5ML PO SOLN: 5.0000 mg | Freq: Once | ORAL | Status: DC | PRN

## 2014-10-08 MED ORDER — MIDAZOLAM HCL 2 MG/2ML IJ SOLN
INTRAMUSCULAR | Status: AC
  Filled 2014-10-08: qty 2

## 2014-10-08 MED ORDER — OXYCODONE HCL 5 MG PO TABS
5.0000 mg | ORAL_TABLET | ORAL | Status: DC | PRN
  Filled 2014-10-08: qty 2

## 2014-10-08 MED ORDER — PROPOFOL 10 MG/ML IV BOLUS
INTRAVENOUS | Status: DC | PRN
  Administered 2014-10-08: 150 mg via INTRAVENOUS

## 2014-10-08 MED ORDER — INSULIN ASPART 100 UNIT/ML ~~LOC~~ SOLN
0.0000 [IU] | Freq: Three times a day (TID) | SUBCUTANEOUS | Status: DC
  Administered 2014-10-08: 3 [IU] via SUBCUTANEOUS
  Administered 2014-10-08: 2 [IU] via SUBCUTANEOUS

## 2014-10-08 MED ORDER — EPHEDRINE SULFATE 50 MG/ML IJ SOLN
INTRAMUSCULAR | Status: DC | PRN
  Administered 2014-10-08 (×3): 10 mg via INTRAVENOUS

## 2014-10-08 MED ORDER — METHOCARBAMOL 500 MG PO TABS
500.0000 mg | ORAL_TABLET | Freq: Four times a day (QID) | ORAL | Status: DC | PRN
  Filled 2014-10-08 (×2): qty 1

## 2014-10-08 MED ORDER — ONDANSETRON HCL 4 MG/2ML IJ SOLN: 4.0000 mg | Freq: Four times a day (QID) | INTRAMUSCULAR | Status: DC | PRN

## 2014-10-08 MED ORDER — MORPHINE SULFATE 2 MG/ML IJ SOLN: 1.0000 mg | INTRAMUSCULAR | Status: DC | PRN

## 2014-10-08 MED ORDER — LACTATED RINGERS IV SOLN
INTRAVENOUS | Status: DC
  Administered 2014-10-08: 50 mL/h via INTRAVENOUS

## 2014-10-08 MED ORDER — ACETAMINOPHEN 325 MG PO TABS
325.0000 mg | ORAL_TABLET | Freq: Four times a day (QID) | ORAL | Status: DC | PRN
  Administered 2014-10-08: 325 mg via ORAL
  Filled 2014-10-08: qty 1

## 2014-10-08 MED ORDER — ONDANSETRON HCL 4 MG/2ML IJ SOLN
INTRAMUSCULAR | Status: AC
  Filled 2014-10-08: qty 2

## 2014-10-08 MED ORDER — ARTIFICIAL TEARS OP OINT
TOPICAL_OINTMENT | OPHTHALMIC | Status: AC
  Filled 2014-10-08: qty 3.5

## 2014-10-08 MED ORDER — LISINOPRIL 40 MG PO TABS
40.0000 mg | ORAL_TABLET | Freq: Every day | ORAL | Status: DC
  Administered 2014-10-08 – 2014-10-09 (×2): 40 mg via ORAL
  Filled 2014-10-08 (×2): qty 1

## 2014-10-08 MED ORDER — SODIUM CHLORIDE 0.9 % IV SOLN
INTRAVENOUS | Status: DC
  Administered 2014-10-08: 75 mL/h via INTRAVENOUS
  Administered 2014-10-09: via INTRAVENOUS

## 2014-10-08 MED ORDER — INSULIN DETEMIR 100 UNIT/ML ~~LOC~~ SOLN
15.0000 [IU] | Freq: Every day | SUBCUTANEOUS | Status: DC
  Administered 2014-10-08 – 2014-10-09 (×2): 15 [IU] via SUBCUTANEOUS
  Filled 2014-10-08 (×2): qty 0.15

## 2014-10-08 MED ORDER — FENTANYL CITRATE 0.05 MG/ML IJ SOLN
INTRAMUSCULAR | Status: DC | PRN
  Administered 2014-10-08 (×2): 50 ug via INTRAVENOUS

## 2014-10-08 MED ORDER — FENTANYL CITRATE 0.05 MG/ML IJ SOLN
INTRAMUSCULAR | Status: AC
  Filled 2014-10-08: qty 5

## 2014-10-08 MED ORDER — MIDAZOLAM HCL 5 MG/5ML IJ SOLN
INTRAMUSCULAR | Status: DC | PRN
  Administered 2014-10-08: 2 mg via INTRAVENOUS

## 2014-10-08 MED ORDER — METOCLOPRAMIDE HCL 10 MG PO TABS: 5.0000 mg | ORAL_TABLET | Freq: Three times a day (TID) | ORAL | Status: DC | PRN

## 2014-10-08 MED ORDER — ONDANSETRON HCL 4 MG PO TABS: 4.0000 mg | ORAL_TABLET | Freq: Four times a day (QID) | ORAL | Status: DC | PRN

## 2014-10-08 MED ORDER — CEFAZOLIN SODIUM 1-5 GM-% IV SOLN
1.0000 g | Freq: Four times a day (QID) | INTRAVENOUS | Status: AC
  Administered 2014-10-08 – 2014-10-09 (×3): 1 g via INTRAVENOUS
  Filled 2014-10-08 (×3): qty 50

## 2014-10-08 MED ORDER — HYDROMORPHONE HCL 1 MG/ML IJ SOLN: 0.2500 mg | INTRAMUSCULAR | Status: DC | PRN

## 2014-10-08 MED ORDER — LISINOPRIL 40 MG PO TABS: 40.0000 mg | ORAL_TABLET | Freq: Every day | ORAL | Status: DC

## 2014-10-08 MED ORDER — PHENYLEPHRINE 40 MCG/ML (10ML) SYRINGE FOR IV PUSH (FOR BLOOD PRESSURE SUPPORT)
PREFILLED_SYRINGE | INTRAVENOUS | Status: AC
  Filled 2014-10-08: qty 10

## 2014-10-08 MED ORDER — OXYCODONE HCL 5 MG PO TABS: 5.0000 mg | ORAL_TABLET | Freq: Once | ORAL | Status: DC | PRN

## 2014-10-08 MED ORDER — HYDROCODONE-ACETAMINOPHEN 5-325 MG PO TABS: 1.0000 | ORAL_TABLET | ORAL | Status: DC | PRN

## 2014-10-08 MED ORDER — PROPOFOL 10 MG/ML IV BOLUS
INTRAVENOUS | Status: AC
  Filled 2014-10-08: qty 20

## 2014-10-08 MED ORDER — TAMSULOSIN HCL 0.4 MG PO CAPS
0.4000 mg | ORAL_CAPSULE | Freq: Every day | ORAL | Status: DC
  Administered 2014-10-08: 0.4 mg via ORAL
  Filled 2014-10-08: qty 1

## 2014-10-08 MED ORDER — ARTIFICIAL TEARS OP OINT
TOPICAL_OINTMENT | OPHTHALMIC | Status: DC | PRN
  Administered 2014-10-08: 1 via OPHTHALMIC

## 2014-10-08 MED ORDER — PHENYLEPHRINE HCL 10 MG/ML IJ SOLN
INTRAMUSCULAR | Status: DC | PRN
  Administered 2014-10-08 (×2): 80 ug via INTRAVENOUS
  Administered 2014-10-08 (×2): 120 ug via INTRAVENOUS

## 2014-10-08 MED ORDER — METOCLOPRAMIDE HCL 5 MG/ML IJ SOLN: 5.0000 mg | Freq: Three times a day (TID) | INTRAMUSCULAR | Status: DC | PRN

## 2014-10-08 MED ORDER — LIDOCAINE HCL (CARDIAC) 20 MG/ML IV SOLN
INTRAVENOUS | Status: AC
  Filled 2014-10-08: qty 5

## 2014-10-08 MED ORDER — DIPHENHYDRAMINE HCL 12.5 MG/5ML PO ELIX: 12.5000 mg | ORAL_SOLUTION | ORAL | Status: DC | PRN

## 2014-10-08 SURGICAL SUPPLY — 41 items
BANDAGE ESMARK 6X9 LF (GAUZE/BANDAGES/DRESSINGS) IMPLANT
BLADE LONG MED 31MMX9MM (MISCELLANEOUS) ×1
BLADE LONG MED 31X9 (MISCELLANEOUS) ×1 IMPLANT
BLADE SURG 10 STRL SS (BLADE) ×5 IMPLANT
BNDG CMPR 9X6 STRL LF SNTH (GAUZE/BANDAGES/DRESSINGS) ×1
BNDG COHESIVE 4X5 TAN STRL (GAUZE/BANDAGES/DRESSINGS) ×3 IMPLANT
BNDG ESMARK 6X9 LF (GAUZE/BANDAGES/DRESSINGS) ×3
BNDG GAUZE ELAST 4 BULKY (GAUZE/BANDAGES/DRESSINGS) ×4 IMPLANT
COVER SURGICAL LIGHT HANDLE (MISCELLANEOUS) ×2 IMPLANT
DRAPE U-SHAPE 47X51 STRL (DRAPES) ×6 IMPLANT
DRSG PAD ABDOMINAL 8X10 ST (GAUZE/BANDAGES/DRESSINGS) ×2 IMPLANT
ELECT REM PT RETURN 9FT ADLT (ELECTROSURGICAL) ×3
ELECTRODE REM PT RTRN 9FT ADLT (ELECTROSURGICAL) ×1 IMPLANT
GAUZE SPONGE 4X4 12PLY STRL (GAUZE/BANDAGES/DRESSINGS) ×3 IMPLANT
GAUZE XEROFORM 5X9 LF (GAUZE/BANDAGES/DRESSINGS) ×3 IMPLANT
GLOVE BIO SURGEON STRL SZ7 (GLOVE) ×2 IMPLANT
GLOVE BIO SURGEON STRL SZ7.5 (GLOVE) ×2 IMPLANT
GLOVE BIO SURGEON STRL SZ8 (GLOVE) ×3 IMPLANT
GLOVE BIOGEL PI IND STRL 7.0 (GLOVE) IMPLANT
GLOVE BIOGEL PI IND STRL 8 (GLOVE) ×1 IMPLANT
GLOVE BIOGEL PI INDICATOR 7.0 (GLOVE) ×4
GLOVE BIOGEL PI INDICATOR 8 (GLOVE) ×4
GLOVE ORTHO TXT STRL SZ7.5 (GLOVE) ×3 IMPLANT
GOWN STRL REUS W/ TWL LRG LVL3 (GOWN DISPOSABLE) ×1 IMPLANT
GOWN STRL REUS W/ TWL XL LVL3 (GOWN DISPOSABLE) ×4 IMPLANT
GOWN STRL REUS W/TWL LRG LVL3 (GOWN DISPOSABLE) ×6
GOWN STRL REUS W/TWL XL LVL3 (GOWN DISPOSABLE) ×3
KIT BASIN OR (CUSTOM PROCEDURE TRAY) ×3 IMPLANT
KIT ROOM TURNOVER OR (KITS) ×3 IMPLANT
NS IRRIG 1000ML POUR BTL (IV SOLUTION) ×3 IMPLANT
PACK ORTHO EXTREMITY (CUSTOM PROCEDURE TRAY) ×3 IMPLANT
PAD ARMBOARD 7.5X6 YLW CONV (MISCELLANEOUS) ×4 IMPLANT
SPONGE GAUZE 4X4 12PLY STER LF (GAUZE/BANDAGES/DRESSINGS) ×2 IMPLANT
SPONGE LAP 18X18 X RAY DECT (DISPOSABLE) ×6 IMPLANT
STAPLER VISISTAT 35W (STAPLE) ×3 IMPLANT
SUT ETHILON 2 0 PSLX (SUTURE) ×6 IMPLANT
TOWEL OR 17X24 6PK STRL BLUE (TOWEL DISPOSABLE) ×3 IMPLANT
TOWEL OR 17X26 10 PK STRL BLUE (TOWEL DISPOSABLE) ×3 IMPLANT
TUBE CONNECTING 12'X1/4 (SUCTIONS) ×1
TUBE CONNECTING 12X1/4 (SUCTIONS) ×2 IMPLANT
YANKAUER SUCT BULB TIP NO VENT (SUCTIONS) ×3 IMPLANT

## 2014-10-08 NOTE — Progress Notes (Signed)
Patient ID: Ryan [name], male   DOB: Jun 03, 1961, 54 y.o.   MRN: [id number] Very comfortable post-op.  Dressing clean and dry.

## 2014-10-08 NOTE — Discharge Instructions (Signed)
Keep your left foot dressing clean and dry. Only put weight on your left heel.

## 2014-10-08 NOTE — Anesthesia Postprocedure Evaluation (Signed)
  Anesthesia Post-op Note  Patient: Ryan PellantRonald R Beem  Procedure(s) Performed: Procedure(s): LEFT FOOT TRANSMETATARSAL AMPUTATION (Left)  Patient Location: PACU  Anesthesia Type:General  Level of Consciousness: awake and alert   Airway and Oxygen Therapy: Patient Spontanous Breathing  Post-op Pain: none  Post-op Assessment: Post-op Vital signs reviewed  Post-op Vital Signs: Reviewed  Last Vitals:  Filed Vitals:   10/08/14 1205  BP: 116/71  Pulse: 92  Temp: 36.8 C  Resp: 18    Complications: No apparent anesthesia complications

## 2014-10-08 NOTE — Progress Notes (Signed)
Orthopedic Tech Progress Note Patient Details:  Ryan PellantRonald R [name] 19-Dec-1960 [id number]  Ortho Devices Type of Ortho Device: Darco shoe Ortho Device/Splint Location: lle Ortho Device/Splint Interventions: Application   Meenakshi Sazama 10/08/2014, 11:06 AM

## 2014-10-08 NOTE — H&P (Signed)
Ryan [name] is an 54 y.o. male.   Chief Complaint:   Left foot wound with exposed bone HPI:   54 yo male diabetic with PVD who underwent a recent left foot 5th ray resection due to a non-healing wound, infection, and necrotic tissue.  He has not been able to heal this wound and now it has worsened significantly.  It is now recommended that he undergo a left foot transmetatarsal amputation due to exposed bone and obvious osteomyelitis.  He understands this fully.  Past Medical History  Diagnosis Date  . Hypertension   . Osteomyelitis of foot   . Heart murmur     was told "not to worry about it"  . Diabetes mellitus without complication     type 2  . Diabetic neuropathy   . Arthritis   . Anemia     low iron  . Peripheral vascular disease     "poor circulation" in feet and necrosis    Past Surgical History  Procedure Laterality Date  . Amputation Right 01/13/2014    Procedure: Right great toe amputation;  Surgeon: Tobi Bastos, MD;  Location: [facility];  Service: Orthopedics;  Laterality: Right;  . Knee surgery Right   . Arm surgery Left     due to broken arm  . Amputation Left 06/08/2014    Procedure: AMPUTATION LEFT GREAT TOE;  Surgeon: Augustin Schooling, MD;  Location: [facility];  Service: Orthopedics;  Laterality: Left;  . Amputation Left 07/01/2014    Procedure: LEFT First Metatarsal RAY AMPUTATION ;  Surgeon: Augustin Schooling, MD;  [facility];  Service: Orthopedics;  Laterality: Left;  . Amputation Left 08/28/2014    Procedure: Left Foot Fifth ray resection;  Surgeon: Mcarthur Rossetti, MD;  Location: [facility];  Service: Orthopedics;  Laterality: Left;    Family History  Problem Relation Age of Onset  . Family history unknown: Yes   Social History:  reports that he has never smoked. His smokeless tobacco use includes Snuff. He reports that he does not drink alcohol or use illicit drugs.  Allergies:  Allergies  Allergen Reactions  . Metformin And Related Other (See  Comments)    Pt states that metformin made sugars fall into 30's    Medications Prior to Admission  Medication Sig Dispense Refill  . doxycycline (VIBRAMYCIN) 50 MG capsule Take 2 capsules (100 mg total) by mouth 2 (two) times daily. 20 capsule 0  . insulin detemir (LEVEMIR) 100 UNIT/ML injection Inject 0.15 mLs (15 Units total) into the skin at bedtime. (Patient taking differently: Inject 15 Units into the skin daily. ) 4 vial 3  . lisinopril (PRINIVIL,ZESTRIL) 40 MG tablet Take 1 tablet (40 mg total) by mouth daily. 90 tablet 3  . acetaminophen (TYLENOL) 325 MG tablet Take 325 mg by mouth every 6 (six) hours as needed for mild pain or moderate pain.    . ferrous sulfate 325 (65 FE) MG tablet Take 1 tablet (325 mg total) by mouth 3 (three) times daily after meals. (Patient not taking: Reported on 07/01/2014) 90 tablet 1  . HYDROcodone-acetaminophen (NORCO) 5-325 MG per tablet Take 1 tablet by mouth every 6 (six) hours as needed for moderate pain. (Patient not taking: Reported on 09/10/2014) 30 tablet 0  . oxyCODONE-acetaminophen (ROXICET) 5-325 MG per tablet Take 1-2 tablets by mouth every 4 (four) hours as needed for severe pain. (Patient not taking: Reported on 08/27/2014) 30 tablet 0    Results for orders placed  or performed during the hospital encounter of 10/08/14 (from the past 48 hour(s))  Basic metabolic panel     Status: Abnormal   Collection Time: 10/08/14  7:30 AM  Result Value Ref Range   Sodium 133 (L) 135 - 145 mmol/L   Potassium 3.5 3.5 - 5.1 mmol/L   Chloride 101 96 - 112 mmol/L   CO2 26 19 - 32 mmol/L   Glucose, Bld 130 (H) 70 - 99 mg/dL   BUN 9 6 - 23 mg/dL   Creatinine, Ser 0.95 0.50 - 1.35 mg/dL   Calcium 8.6 8.4 - 10.5 mg/dL   GFR calc non Af Amer >90 >90 mL/min   GFR calc Af Amer >90 >90 mL/min    Comment: (NOTE) The eGFR has been calculated using the CKD EPI equation. This calculation has not been validated in all clinical situations. eGFR's persistently <90  mL/min signify possible Chronic Kidney Disease.    Anion gap 6 5 - 15  CBC     Status: Abnormal   Collection Time: 10/08/14  7:30 AM  Result Value Ref Range   WBC 13.7 (H) 4.0 - 10.5 K/uL   RBC 3.86 (L) 4.22 - 5.81 MIL/uL   Hemoglobin 11.5 (L) 13.0 - 17.0 g/dL   HCT 35.4 (L) 39.0 - 52.0 %   MCV 91.7 78.0 - 100.0 fL   MCH 29.8 26.0 - 34.0 pg   MCHC 32.5 30.0 - 36.0 g/dL   RDW 13.2 11.5 - 15.5 %   Platelets 346 150 - 400 K/uL  Glucose, capillary     Status: Abnormal   Collection Time: 10/08/14  7:33 AM  Result Value Ref Range   Glucose-Capillary 111 (H) 70 - 99 mg/dL  Glucose, capillary     Status: Abnormal   Collection Time: 10/08/14  9:25 AM  Result Value Ref Range   Glucose-Capillary 122 (H) 70 - 99 mg/dL   No results found.  ROS  Blood pressure 189/66, pulse 90, temperature 98.3 F (36.8 C), temperature source Oral, resp. rate 20, height 5' 11"  (1.803 m), weight 77.111 kg (170 lb), SpO2 100 %. Physical Exam  Constitutional: He is oriented to person, place, and time. He appears well-developed and well-nourished.  HENT:  Head: Normocephalic and atraumatic.  Eyes: EOM are normal. Pupils are equal, round, and reactive to light.  Neck: Normal range of motion. Neck supple.  Cardiovascular: Normal rate and regular rhythm.   Respiratory: Effort normal and breath sounds normal.  GI: Soft. Bowel sounds are normal.  Musculoskeletal:       Feet:  Neurological: He is alert and oriented to person, place, and time.  Skin: Skin is warm and dry.  Psychiatric: He has a normal mood and affect.     Assessment/Plan Left foot necrosis with diabetic ulcer and peripheral vascular disease.  Obvious osteomyelitis. 1)  To the OR today for a left foot transmetatarsal amputation  Antawan Mchugh Y 10/08/2014, 9:40 AM

## 2014-10-08 NOTE — Brief Op Note (Signed)
10/08/2014  10:44 AM  PATIENT:  Ryan [name]  54 y.o. male  PRE-OPERATIVE DIAGNOSIS:  left forefoot necrosis, peripheral vascular disease  POST-OPERATIVE DIAGNOSIS:  left forefoot necrosis, peripheral vascular disease  PROCEDURE:  Procedure(s): LEFT FOOT TRANSMETATARSAL AMPUTATION (Left)  SURGEON:  Surgeon(s) and Role:    * Kathryne Hitchhristopher Y Jalani Cullifer, MD - Primary  PHYSICIAN ASSISTANT: Rexene EdisonGil Clark, PA-C  ANESTHESIA:   general  EBL:  Total I/O In: 500 [I.V.:500] Out: -   BLOOD ADMINISTERED:none  DRAINS: none   LOCAL MEDICATIONS USED:  NONE  SPECIMEN:  No Specimen  DISPOSITION OF SPECIMEN:  N/A  COUNTS:  YES  TOURNIQUET:  * No tourniquets in log *  DICTATION: .Other Dictation: Dictation Number [id number]  PLAN OF CARE: Admit to inpatient   PATIENT DISPOSITION:  PACU - hemodynamically stable.   Delay start of Pharmacological VTE agent (>24hrs) due to surgical blood loss or risk of bleeding: no

## 2014-10-08 NOTE — Progress Notes (Signed)
UR complete.  Nolita Kutter RN, MSN 

## 2014-10-08 NOTE — Anesthesia Preprocedure Evaluation (Addendum)
Anesthesia Evaluation  Patient identified by MRN, date of birth, ID band Patient awake    Reviewed: Allergy & Precautions, H&P , NPO status , Patient's Chart, lab work & pertinent test results  History of Anesthesia Complications Negative for: history of anesthetic complications  Airway Mallampati: I  TM Distance: >3 FB Neck ROM: Full    Dental  (+) Poor Dentition, Dental Advisory Given   Pulmonary neg pulmonary ROS,    Pulmonary exam normal       Cardiovascular hypertension, Pt. on medications + Peripheral Vascular Disease     Neuro/Psych negative neurological ROS     GI/Hepatic negative GI ROS, Neg liver ROS,   Endo/Other  diabetes, Poorly Controlled, Type 2, Insulin Dependent  Renal/GU negative Renal ROS     Musculoskeletal  (+) Arthritis -,   Abdominal   Peds  Hematology  (+) anemia , hgb 11.5   Anesthesia Other Findings   Reproductive/Obstetrics                            Anesthesia Physical  Anesthesia Plan  ASA: III  Anesthesia Plan: General   Post-op Pain Management:    Induction: Intravenous  Airway Management Planned: LMA  Additional Equipment:   Intra-op Plan:   Post-operative Plan:   Informed Consent: I have reviewed the patients History and Physical, chart, labs and discussed the procedure including the risks, benefits and alternatives for the proposed anesthesia with the patient or authorized representative who has indicated his/her understanding and acceptance.     Plan Discussed with: CRNA  Anesthesia Plan Comments:         Anesthesia Quick Evaluation

## 2014-10-08 NOTE — Transfer of Care (Signed)
Immediate Anesthesia Transfer of Care Note  Patient: Ryan [name]  Procedure(s) Performed: Procedure(s): LEFT FOOT TRANSMETATARSAL AMPUTATION (Left)  Patient Location: PACU  Anesthesia Type:General  Level of Consciousness: awake, alert  and oriented  Airway & Oxygen Therapy: Patient Spontanous Breathing and Patient connected to nasal cannula oxygen  Post-op Assessment: Report given to RN  Post vital signs: Reviewed and stable  Last Vitals:  Filed Vitals:   10/08/14 0727  BP: 189/66  Pulse: 90  Temp: 36.8 C  Resp: 20    Complications: No apparent anesthesia complications

## 2014-10-08 NOTE — Anesthesia Procedure Notes (Signed)
Procedure Name: LMA Insertion Date/Time: 10/08/2014 10:03 AM Performed by: Jefm MilesENNIE, Reinhold Rickey E Pre-anesthesia Checklist: Patient identified, Emergency Drugs available, Suction available, Patient being monitored and Timeout performed Patient Re-evaluated:Patient Re-evaluated prior to inductionOxygen Delivery Method: Circle system utilized Preoxygenation: Pre-oxygenation with 100% oxygen Intubation Type: IV induction Ventilation: Mask ventilation without difficulty LMA: LMA inserted LMA Size: 5.0 Number of attempts: 1 Placement Confirmation: positive ETCO2 and breath sounds checked- equal and bilateral Tube secured with: Tape Dental Injury: Teeth and Oropharynx as per pre-operative assessment

## 2014-10-08 NOTE — Care Management Note (Signed)
    Page 1 of 1   10/08/2014     3:24:46 PM CARE MANAGEMENT NOTE 10/08/2014  Patient:  Ryan [name],Ryan [name]   Account Number:  [id number]  Date Initiated:  10/08/2014  Documentation initiated by:  Ryan [name],Ryan [name]  Subjective/Objective Assessment:   Patient was admitted with osteomyelitis, non-healing foot wound.  Underwent LEFT FOOT TRANSMETATARSAL AMPUTATION.     Action/Plan:   Will follow for discharge needs pending PT/OT eval and physician orders.  Patient was admitted as self-pay   Anticipated DC Date:     Anticipated DC Plan:    In-house referral  Financial Counselor         Choice offered to / List presented to:             Status of service:  In process, will continue to follow Medicare Important Message given?   (If response is "NO", the following Medicare IM given date fields will be blank) Date Medicare IM given:   Medicare IM given by:   Date Additional Medicare IM given:   Additional Medicare IM given by:    Discharge Disposition:    Per UR Regulation:  Reviewed for med. necessity/level of care/duration of stay  If discussed at Long Length of Stay Meetings, dates discussed:    Comments:  10/08/14 1520 Ryan Balesourtney Elsie Sakuma RN, MSN, CM- Voicemail left for financial counselor to notify of admission.

## 2014-10-09 LAB — GLUCOSE, CAPILLARY
GLUCOSE-CAPILLARY: 146 mg/dL — AB (ref 70–99)
Glucose-Capillary: 84 mg/dL (ref 70–99)

## 2014-10-09 NOTE — Progress Notes (Signed)
Subjective: 1 Day Post-Op Procedure(s) (LRB): LEFT FOOT TRANSMETATARSAL AMPUTATION (Left) Patient reports pain as mild.    Objective: Vital signs in last 24 hours: Temp:  [97.4 F (36.3 C)-102.6 F (39.2 C)] 98.7 F (37.1 C) (03/11 0527) Pulse Rate:  [85-100] 85 (03/11 0527) Resp:  [17-27] 18 (03/11 0527) BP: (107-189)/(55-71) 147/63 mmHg (03/11 0527) SpO2:  [97 %-100 %] 100 % (03/11 0527) Weight:  [77.111 kg (170 lb)] 77.111 kg (170 lb) (03/10 0727)  Intake/Output from previous day: 03/10 0701 - 03/11 0700 In: 980 [P.O.:480; I.V.:500] Out: 500 [Urine:500] Intake/Output this shift:     Recent Labs  10/08/14 0730  HGB 11.5*    Recent Labs  10/08/14 0730  WBC 13.7*  RBC 3.86*  HCT 35.4*  PLT 346    Recent Labs  10/08/14 0730  NA 133*  K 3.5  CL 101  CO2 26  BUN 9  CREATININE 0.95  GLUCOSE 130*  CALCIUM 8.6   No results for input(s): LABPT, INR in the last 72 hours.  Incision: dressing C/D/I  Assessment/Plan: 1 Day Post-Op Procedure(s) (LRB): LEFT FOOT TRANSMETATARSAL AMPUTATION (Left) Discharge to home today  Kathryne HitchBLACKMAN,Norrine Ballester Y 10/09/2014, 6:55 AM

## 2014-10-09 NOTE — Op Note (Signed)
NAMEJAEGER, TRUEHEART NO.:  [id number]  MEDICAL RECORD NO.:  [id number]  LOCATION:  5N31C                        FACILITY:  [facility]  PHYSICIAN:  Vanita Panda. Magnus Ivan, M.D.DATE OF BIRTH:  23-Dec-1960  DATE OF PROCEDURE:  10/08/2014 DATE OF DISCHARGE:                              OPERATIVE REPORT   PREOPERATIVE DIAGNOSIS:  Left forefoot necrosis with osteomyelitis and exposed bone, status post first and fifth ray amputations.  POSTOPERATIVE DIAGNOSIS:  Left forefoot necrosis with osteomyelitis and exposed bone, status post first and fifth ray amputations.  PROCEDURE:  Left foot high transmetatarsal amputation.  SURGEON:  Vanita Panda. Magnus Ivan, M.D.  ASSISTANT:  Richardean Canal, PA-C  ANESTHESIA:  General.  BLOOD LOSS:  Less than 50 mL.  COMPLICATIONS:  None.  INDICATIONS:  Mr. Colello is a 54 year old gentleman with peripheral vascular disease and diabetes.  He has been under poor control, off and on.  He has gotten under better control, but has had problems with foot wound for some time.  He has had a previous first ray amputation that did well, but then about a month or more ago, presented to the hospital with necrosis along the fifth ray with obvious osteomyelitis.  He underwent then a fifth ray amputation and since that time, we followed him in the office, he is breaking down that wound and now has exposed fourth metatarsal head with obvious osteonecrosis.  His fourth toe appears dusky and he has a wound on his second toe dorsally.  At this point, we recommending a transmetatarsal amputation to hopefully get some healing.  He understands that this may eventually not work and he would need a below-knee amputation, but hopefully we can get it to work his compliance with his diabetic regimen has been much improved.  PROCEDURE DESCRIPTION:  After informed consent was obtained, appropriate left foot was marked.  He was brought to the operating room and  placed supine on the operating table where general anesthesia was then obtained.  His left foot was prepped and draped with Betadine paint and scrub.  Time-out was called and he was identified as correct patient and correct left foot.  We then used a towel with an Esmarch around the ankle as a local tourniquet.  We excised around all the remaining metatarsals and removed necrotic skin, muscle, soft tissue, and bone easily.  We then used an oscillating saw to transect all metatarsals and quite proximal near the midfoot.  This allowed [place] to remove all necrotic tissue.  We then let the Esmarch down and hemostasis was obtained with electrocautery.  We thoroughly irrigated the wound and was able to reapproximate the flap of tissue over the exposed bone and the wound easily with interrupted 2-0 nylon suture.  Xeroform and well-padded sterile dressing were applied.  He was awakened, extubated, and taken to the recovery room in stable condition.  All final counts were correct. There were no complications noted.  Of note, Richardean Canal, PA-C assisted the entire case and his assistance was very helpful in facilitating this case at all aspects.  Postoperatively, he will be admitted with only began to put weight to his foot, we will likely discharge him tomorrow.  Vanita Pandahristopher Y. Magnus IvanBlackman, M.D.     CYB/MEDQ  D:  10/08/2014  T:  10/09/2014  Job:  [id number]

## 2014-10-09 NOTE — Progress Notes (Signed)
Patient d/c to home, IV removed, instructions reviewed.  Per Dr. Magnus IvanBlackman, patient safe to drive himself home, as he has done so in the past.  Patient has not been taking narcotics.

## 2014-10-09 NOTE — Discharge Summary (Signed)
Patient ID: Ryan [name] MRN: [id number] DOB/AGE: [birth year] 54 y.o.  Admit date: 10/08/2014 Discharge date: 10/09/2014  Admission Diagnoses:  Principal Problem:   Osteomyelitis of left foot Active Problems:   S/P transmetatarsal amputation of foot   Discharge Diagnoses:  Same  Past Medical History  Diagnosis Date  . Hypertension   . Osteomyelitis of foot   . Heart murmur     was told "not to worry about it"  . Diabetes mellitus without complication     type 2  . Diabetic neuropathy   . Arthritis   . Anemia     low iron  . Peripheral vascular disease     "poor circulation" in feet and necrosis    Surgeries: Procedure(s): LEFT FOOT TRANSMETATARSAL AMPUTATION on 10/08/2014   Consultants:    Discharged Condition: Improved  Hospital Course: Ryan [name] is an 54 y.o. male who was admitted 10/08/2014 for operative treatment ofOsteomyelitis of left foot. Patient has severe unremitting pain that affects sleep, daily activities, and work/hobbies. After pre-op clearance the patient was taken to the operating room on 10/08/2014 and underwent  Procedure(s): LEFT FOOT TRANSMETATARSAL AMPUTATION.    Patient was given perioperative antibiotics: Anti-infectives    Start     Dose/Rate Route Frequency Ordered Stop   10/08/14 1800  ceFAZolin (ANCEF) IVPB 1 g/50 mL premix     1 g 100 mL/hr over 30 Minutes Intravenous Every 6 hours 10/08/14 1202 10/09/14 0605   10/08/14 0600  vancomycin (VANCOCIN) IVPB 1000 mg/200 mL premix     1,000 mg 200 mL/hr over 60 Minutes Intravenous On call to O.R. 10/07/14 1318 10/08/14 1050       Patient was given sequential compression devices, early ambulation, and chemoprophylaxis to prevent DVT.  Patient benefited maximally from hospital stay and there were no complications.    Recent vital signs: Patient Vitals for the past 24 hrs:  BP Temp Temp src Pulse Resp SpO2 Height Weight  10/09/14 0527 (!) 147/63 mmHg 98.7 F (37.1 C) - 85 18 100 %  - -  10/09/14 0056 (!) 144/55 mmHg 99.5 F (37.5 C) - 89 18 99 % - -  10/08/14 2258 - 99.6 F (37.6 C) - - - - - -  10/08/14 2042 (!) 157/66 mmHg (!) 102.6 F (39.2 C) - 100 18 100 % - -  10/08/14 1205 116/71 mmHg 98.3 F (36.8 C) Oral 92 18 99 % - -  10/08/14 1130 - - - 89 (!) 25 98 % - -  10/08/14 1126 - 98.2 F (36.8 C) - 90 (!) 27 99 % - -  10/08/14 1115 - - - 93 (!) 21 99 % - -  10/08/14 1100 125/69 mmHg - - 88 17 99 % - -  10/08/14 1045 107/69 mmHg 97.4 F (36.3 C) - 100 (!) 26 97 % - -  10/08/14 0727 (!) 189/66 mmHg 98.3 F (36.8 C) Oral 90 20 100 %  (1.803 m) 77.111 kg (170 lb)     Recent laboratory studies:  Recent Labs  10/08/14 0730  WBC 13.7*  HGB 11.5*  HCT 35.4*  PLT 346  NA 133*  K 3.5  CL 101  CO2 26  BUN 9  CREATININE 0.95  GLUCOSE 130*  CALCIUM 8.6     Discharge Medications:     Medication List    STOP taking these medications        doxycycline 50 MG capsule  Commonly known as:  VIBRAMYCIN  ferrous sulfate 325 (65 FE) MG tablet     HYDROcodone-acetaminophen 5-325 MG per tablet  Commonly known as:  NORCO     oxyCODONE-acetaminophen 5-325 MG per tablet  Commonly known as:  ROXICET      TAKE these medications        acetaminophen 325 MG tablet  Commonly known as:  TYLENOL  Take 325 mg by mouth every 6 (six) hours as needed for mild pain or moderate pain.     insulin detemir 100 UNIT/ML injection  Commonly known as:  LEVEMIR  Inject 0.15 mLs (15 Units total) into the skin at bedtime.     lisinopril 40 MG tablet  Commonly known as:  PRINIVIL,ZESTRIL  Take 1 tablet (40 mg total) by mouth daily.        Diagnostic Studies: No results found.  Disposition: 01-Home or Self Care      Discharge Instructions    Discharge patient    Complete by:  As directed            Follow-up Information    Follow up with Kathryne HitchBLACKMAN,Jomayra Novitsky Y, MD In 2 weeks.   Specialty:  Orthopedic Surgery   Contact information:   [address] [place] [address] [phone]        Signed: Kathryne HitchBLACKMAN,Jemell Town Y 10/09/2014, 6:56 AM

## 2014-10-11 ENCOUNTER — Encounter (HOSPITAL_COMMUNITY): Payer: Self-pay | Admitting: Orthopaedic Surgery

## 2014-10-20 ENCOUNTER — Encounter: Payer: Self-pay | Admitting: Emergency Medicine

## 2014-10-22 NOTE — Telephone Encounter (Signed)
erroneous

## 2014-11-02 ENCOUNTER — Ambulatory Visit: Payer: Medicaid Other | Attending: Internal Medicine | Admitting: *Deleted

## 2014-11-02 VITALS — BP 125/74 | HR 74 | Temp 97.9°F | Resp 16

## 2014-11-02 DIAGNOSIS — E1165 Type 2 diabetes mellitus with hyperglycemia: Secondary | ICD-10-CM | POA: Diagnosis not present

## 2014-11-02 LAB — GLUCOSE, POCT (MANUAL RESULT ENTRY): POC Glucose: 151 mg/dl — AB (ref 70–99)

## 2014-11-02 LAB — POCT GLYCOSYLATED HEMOGLOBIN (HGB A1C): HEMOGLOBIN A1C: 8.1

## 2014-11-02 NOTE — Progress Notes (Signed)
Patient presents for BP check and CBG and record review, however, patient did not bring log or meter Med list reviewed; patient reports taking all meds as directed except taking 1/2 tab lisinopril over last 2 weeks due to BPs  82/60 at home Patient reports AM fasting blood sugars ranging 89-119 Patient reports before lunch blood sugars ranging 130-140 Patient reports before dinner blood sugars ranging 86-160 Discussed need for low sodium diet and using Mrs. Dash as alternative to salt Encouraged to choose foods with 5% or less of daily value for sodium. Patient walking during day with 5 lb ankle weights for exercise Patient denies headaches, SHOB, chest pain or pressure States blurred vision when BP is low  BP 125/74 P 74 R  16 T 97.9 oral SPO2  100%  Patient advised to call for med refills at least 7 days before running out so as not to go without.  Patient given BS log and instructed on use. Instructed to bring to all future visits Patient instructed to f/u with PCP in 2 weeks following recent hospitalization  Patient given literature on DASH Eating Plan Diabetes and Food, Diabetes and Exercise, Basic Carb Counting, The Plate Method and hypoglycemia

## 2014-11-02 NOTE — Patient Instructions (Signed)
Diabetes Mellitus and Food It is important for you to manage your blood sugar (glucose) level. Your blood glucose level can be greatly affected by what you eat. Eating healthier foods in the appropriate amounts throughout the day at about the same time each day will help you control your blood glucose level. It can also help slow or prevent worsening of your diabetes mellitus. Healthy eating may even help you improve the level of your blood pressure and reach or maintain a healthy weight.  HOW CAN FOOD AFFECT ME? Carbohydrates Carbohydrates affect your blood glucose level more than any other type of food. Your dietitian will help you determine how many carbohydrates to eat at each meal and teach you how to count carbohydrates. Counting carbohydrates is important to keep your blood glucose at a healthy level, especially if you are using insulin or taking certain medicines for diabetes mellitus. Alcohol Alcohol can cause sudden decreases in blood glucose (hypoglycemia), especially if you use insulin or take certain medicines for diabetes mellitus. Hypoglycemia can be a life-threatening condition. Symptoms of hypoglycemia (sleepiness, dizziness, and disorientation) are similar to symptoms of having too much alcohol.  If your health care provider has given you approval to drink alcohol, do so in moderation and use the following guidelines:  Women should not have more than one drink per day, and men should not have more than two drinks per day. One drink is equal to:  12 oz of beer.  5 oz of wine.  1 oz of hard liquor.  Do not drink on an empty stomach.  Keep yourself hydrated. Have water, diet soda, or unsweetened iced tea.  Regular soda, juice, and other mixers might contain a lot of carbohydrates and should be counted. WHAT FOODS ARE NOT RECOMMENDED? As you make food choices, it is important to remember that all foods are not the same. Some foods have fewer nutrients per serving than other  foods, even though they might have the same number of calories or carbohydrates. It is difficult to get your body what it needs when you eat foods with fewer nutrients. Examples of foods that you should avoid that are high in calories and carbohydrates but low in nutrients include:  Trans fats (most processed foods list trans fats on the Nutrition Facts label).  Regular soda.  Juice.  Candy.  Sweets, such as cake, pie, doughnuts, and cookies.  Fried foods. WHAT FOODS CAN I EAT? Have nutrient-rich foods, which will nourish your body and keep you healthy. The food you should eat also will depend on several factors, including:  The calories you need.  The medicines you take.  Your weight.  Your blood glucose level.  Your blood pressure level.  Your cholesterol level. You also should eat a variety of foods, including:  Protein, such as meat, poultry, fish, tofu, nuts, and seeds (lean animal proteins are best).  Fruits.  Vegetables.  Dairy products, such as milk, cheese, and yogurt (low fat is best).  Breads, grains, pasta, cereal, rice, and beans.  Fats such as olive oil, trans fat-free margarine, canola oil, avocado, and olives. DOES EVERYONE WITH DIABETES MELLITUS HAVE THE SAME MEAL PLAN? Because every person with diabetes mellitus is different, there is not one meal plan that works for everyone. It is very important that you meet with a dietitian who will help you create a meal plan that is just right for you. Document Released: 04/13/2005 Document Revised: 07/22/2013 Document Reviewed: 06/13/2013 Exit[employer] Patient Information 2015 [employer]. This   information is not intended to replace advice given to you by your health care provider. Make sure you discuss any questions you have with your health care provider. Diabetes and Exercise Exercising regularly is important. It is not just about losing weight. It has many health benefits, such as:  Improving your overall  fitness, flexibility, and endurance.  Increasing your bone density.  Helping with weight control.  Decreasing your body fat.  Increasing your muscle strength.  Reducing stress and tension.  Improving your overall health. People with diabetes who exercise gain additional benefits because exercise:  Reduces appetite.  Improves the body's use of blood sugar (glucose).  Helps lower or control blood glucose.  Decreases blood pressure.  Helps control blood lipids (such as cholesterol and triglycerides).  Improves the body's use of the hormone insulin by:  Increasing the body's insulin sensitivity.  Reducing the body's insulin needs.  Decreases the risk for heart disease because exercising:  Lowers cholesterol and triglycerides levels.  Increases the levels of good cholesterol (such as high-density lipoproteins [HDL]) in the body.  Lowers blood glucose levels. YOUR ACTIVITY PLAN  Choose an activity that you enjoy and set realistic goals. Your health care provider or diabetes educator can help you make an activity plan that works for you. Exercise regularly as directed by your health care provider. This includes:  Performing resistance training twice a week such as push-ups, sit-ups, lifting weights, or using resistance bands.  Performing 150 minutes of cardio exercises each week such as walking, running, or playing sports.  Staying active and spending no more than 90 minutes at one time being inactive. Even short bursts of exercise are good for you. Three 10-minute sessions spread throughout the day are just as beneficial as a single 30-minute session. Some exercise ideas include:  Taking the dog for a walk.  Taking the stairs instead of the elevator.  Dancing to your favorite song.  Doing an exercise video.  Doing your favorite exercise with a friend. RECOMMENDATIONS FOR EXERCISING WITH TYPE 1 OR TYPE 2 DIABETES   Check your blood glucose before exercising. If  blood glucose levels are greater than 240 mg/dL, check for urine ketones. Do not exercise if ketones are present.  Avoid injecting insulin into areas of the body that are going to be exercised. For example, avoid injecting insulin into:  The arms when playing tennis.  The legs when jogging.  Keep a record of:  Food intake before and after you exercise.  Expected peak times of insulin action.  Blood glucose levels before and after you exercise.  The type and amount of exercise you have done.  Review your records with your health care provider. Your health care provider will help you to develop guidelines for adjusting food intake and insulin amounts before and after exercising.  If you take insulin or oral hypoglycemic agents, watch for signs and symptoms of hypoglycemia. They include:  Dizziness.  Shaking.  Sweating.  Chills.  Confusion.  Drink plenty of water while you exercise to prevent dehydration or heat stroke. Body water is lost during exercise and must be replaced.  Talk to your health care provider before starting an exercise program to make sure it is safe for you. Remember, almost any type of activity is better than none. Document Released: 10/07/2003 Document Revised: 12/01/2013 Document Reviewed: 12/24/2012 ExitCare Patient Information 2015 [employer]. This information is not intended to replace advice given to you by your health care provider. Make sure you discuss any   questions you have with your health care provider. Basic Carbohydrate Counting for Diabetes Mellitus Carbohydrate counting is a method for keeping track of the amount of carbohydrates you eat. Eating carbohydrates naturally increases the level of sugar (glucose) in your blood, so it is important for you to know the amount that is okay for you to have in every meal. Carbohydrate counting helps keep the level of glucose in your blood within normal limits. The amount of carbohydrates allowed is  different for every person. A dietitian can help you calculate the amount that is right for you. Once you know the amount of carbohydrates you can have, you can count the carbohydrates in the foods you want to eat. Carbohydrates are found in the following foods:  Grains, such as breads and cereals.  Dried beans and soy products.  Starchy vegetables, such as potatoes, peas, and corn.  Fruit and fruit juices.  Milk and yogurt.  Sweets and snack foods, such as cake, cookies, candy, chips, soft drinks, and fruit drinks. CARBOHYDRATE COUNTING There are two ways to count the carbohydrates in your food. You can use either of the methods or a combination of both. Reading the "Nutrition Facts" on Packaged Food The "Nutrition Facts" is an area that is included on the labels of almost all packaged food and beverages in the [place]. It includes the serving size of that food or beverage and information about the nutrients in each serving of the food, including the grams (g) of carbohydrate per serving.  Decide the number of servings of this food or beverage that you will be able to eat or drink. Multiply that number of servings by the number of grams of carbohydrate that is listed on the label for that serving. The total will be the amount of carbohydrates you will be having when you eat or drink this food or beverage. Learning Standard Serving Sizes of Food When you eat food that is not packaged or does not include "Nutrition Facts" on the label, you need to measure the servings in order to count the amount of carbohydrates.A serving of most carbohydrate-rich foods contains about 15 g of carbohydrates. The following list includes serving sizes of carbohydrate-rich foods that provide 15 g ofcarbohydrate per serving:   1 slice of bread (1 oz) or 1 six-inch tortilla.    of a hamburger bun or English muffin.  4-6 crackers.   cup unsweetened dry cereal.    cup hot cereal.   cup rice or  pasta.    cup mashed potatoes or  of a large baked potato.  1 cup fresh fruit or one small piece of fruit.    cup canned or frozen fruit or fruit juice.  1 cup milk.   cup plain fat-free yogurt or yogurt sweetened with artificial sweeteners.   cup cooked dried beans or starchy vegetable, such as peas, corn, or potatoes.  Decide the number of standard-size servings that you will eat. Multiply that number of servings by 15 (the grams of carbohydrates in that serving). For example, if you eat 2 cups of strawberries, you will have eaten 2 servings and 30 g of carbohydrates (2 servings x 15 g = 30 g). For foods such as soups and casseroles, in which more than one food is mixed in, you will need to count the carbohydrates in each food that is included. EXAMPLE OF CARBOHYDRATE COUNTING Sample Dinner  3 oz chicken breast.   cup of brown rice.   cup of corn.    1 cup milk.   1 cup strawberries with sugar-free whipped topping.  Carbohydrate Calculation Step 1: Identify the foods that contain carbohydrates:   Rice.   Corn.   Milk.   Strawberries. Step 2:Calculate the number of servings eaten of each:   2 servings of rice.   1 serving of corn.   1 serving of milk.   1 serving of strawberries. Step 3: Multiply each of those number of servings by 15 g:   2 servings of rice x 15 g = 30 g.   1 serving of corn x 15 g = 15 g.   1 serving of milk x 15 g = 15 g.   1 serving of strawberries x 15 g = 15 g. Step 4: Add together all of the amounts to find the total grams of carbohydrates eaten: 30 g + 15 g + 15 g + 15 g = 75 g. Document Released: 07/17/2005 Document Revised: 12/01/2013 Document Reviewed: 06/13/2013 ExitCare Patient Information 2015 [employer]. This information is not intended to replace advice given to you by your health care provider. Make sure you discuss any questions you have with your health care provider. DASH Eating Plan DASH stands  for "Dietary Approaches to Stop Hypertension." The DASH eating plan is a healthy eating plan that has been shown to reduce high blood pressure (hypertension). Additional health benefits may include reducing the risk of type 2 diabetes mellitus, heart disease, and stroke. The DASH eating plan may also help with weight loss. WHAT DO I NEED TO KNOW ABOUT THE DASH EATING PLAN? For the DASH eating plan, you will follow these general guidelines:  Choose foods with a percent daily value for sodium of less than 5% (as listed on the food label).  Use salt-free seasonings or herbs instead of table salt or sea salt.  Check with your health care provider or pharmacist before using salt substitutes.  Eat lower-sodium products, often labeled as "lower sodium" or "no salt added."  Eat fresh foods.  Eat more vegetables, fruits, and low-fat dairy products.  Choose whole grains. Look for the word "whole" as the first word in the ingredient list.  Choose fish and skinless chicken or turkey more often than red meat. Limit fish, poultry, and meat to 6 oz (170 g) each day.  Limit sweets, desserts, sugars, and sugary drinks.  Choose heart-healthy fats.  Limit cheese to 1 oz (28 g) per day.  Eat more home-cooked food and less restaurant, buffet, and fast food.  Limit fried foods.  Cook foods using methods other than frying.  Limit canned vegetables. If you do use them, rinse them well to decrease the sodium.  When eating at a restaurant, ask that your food be prepared with less salt, or no salt if possible. WHAT FOODS CAN I EAT? Seek help from a dietitian for individual calorie needs. Grains Whole grain or whole wheat bread. Brown rice. Whole grain or whole wheat pasta. Quinoa, bulgur, and whole grain cereals. Low-sodium cereals. Corn or whole wheat flour tortillas. Whole grain cornbread. Whole grain crackers. Low-sodium crackers. Vegetables Fresh or frozen vegetables (raw, steamed, roasted, or  grilled). Low-sodium or reduced-sodium tomato and vegetable juices. Low-sodium or reduced-sodium tomato sauce and paste. Low-sodium or reduced-sodium canned vegetables.  Fruits All fresh, canned (in natural juice), or frozen fruits. Meat and Other Protein Products Ground beef (85% or leaner), grass-fed beef, or beef trimmed of fat. Skinless chicken or turkey. Ground chicken or turkey. Pork trimmed of fat. All   fish and seafood. Eggs. Dried beans, peas, or lentils. Unsalted nuts and seeds. Unsalted canned beans. Dairy Low-fat dairy products, such as skim or 1% milk, 2% or reduced-fat cheeses, low-fat ricotta or cottage cheese, or plain low-fat yogurt. Low-sodium or reduced-sodium cheeses. Fats and Oils Tub margarines without trans fats. Light or reduced-fat mayonnaise and salad dressings (reduced sodium). Avocado. Safflower, olive, or canola oils. Natural peanut or almond butter. Other Unsalted popcorn and pretzels. The items listed above may not be a complete list of recommended foods or beverages. Contact your dietitian for more options. WHAT FOODS ARE NOT RECOMMENDED? Grains White bread. White pasta. White rice. Refined cornbread. Bagels and croissants. Crackers that contain trans fat. Vegetables Creamed or fried vegetables. Vegetables in a cheese sauce. Regular canned vegetables. Regular canned tomato sauce and paste. Regular tomato and vegetable juices. Fruits Dried fruits. Canned fruit in light or heavy syrup. Fruit juice. Meat and Other Protein Products Fatty cuts of meat. Ribs, chicken wings, bacon, sausage, bologna, salami, chitterlings, fatback, hot dogs, bratwurst, and packaged luncheon meats. Salted nuts and seeds. Canned beans with salt. Dairy Whole or 2% milk, cream, half-and-half, and cream cheese. Whole-fat or sweetened yogurt. Full-fat cheeses or blue cheese. Nondairy creamers and whipped toppings. Processed cheese, cheese spreads, or cheese curds. Condiments Onion and garlic  salt, seasoned salt, table salt, and sea salt. Canned and packaged gravies. Worcestershire sauce. Tartar sauce. Barbecue sauce. Teriyaki sauce. Soy sauce, including reduced sodium. Steak sauce. Fish sauce. Oyster sauce. Cocktail sauce. Horseradish. Ketchup and mustard. Meat flavorings and tenderizers. Bouillon cubes. Hot sauce. Tabasco sauce. Marinades. Taco seasonings. Relishes. Fats and Oils Butter, stick margarine, lard, shortening, ghee, and bacon fat. Coconut, palm kernel, or palm oils. Regular salad dressings. Other Pickles and olives. Salted popcorn and pretzels. The items listed above may not be a complete list of foods and beverages to avoid. Contact your dietitian for more information. WHERE CAN I FIND MORE INFORMATION? National Heart, Lung, and [employer] Institute: www.nhlbi.nih.gov/health/health-topics/topics/dash/ Document Released: 07/06/2011 Document Revised: 12/01/2013 Document Reviewed: 05/21/2013 ExitCare Patient Information 2015 [employer]. This information is not intended to replace advice given to you by your health care provider. Make sure you discuss any questions you have with your health care provider.  

## 2014-11-16 ENCOUNTER — Ambulatory Visit: Payer: No Typology Code available for payment source | Admitting: Internal Medicine

## 2014-11-24 ENCOUNTER — Other Ambulatory Visit: Payer: Self-pay | Admitting: *Deleted

## 2014-11-24 ENCOUNTER — Other Ambulatory Visit (HOSPITAL_COMMUNITY): Payer: Self-pay | Admitting: Physician Assistant

## 2014-11-24 ENCOUNTER — Telehealth: Payer: Self-pay | Admitting: Internal Medicine

## 2014-11-24 DIAGNOSIS — M25552 Pain in left hip: Secondary | ICD-10-CM

## 2014-11-24 DIAGNOSIS — I10 Essential (primary) hypertension: Secondary | ICD-10-CM

## 2014-11-24 MED ORDER — LISINOPRIL 20 MG PO TABS: 20.0000 mg | ORAL_TABLET | Freq: Every day | ORAL | Status: DC

## 2014-11-24 NOTE — Telephone Encounter (Signed)
Patient walked in asking for his lisinopril 40 mg to be dropped to 20 mg.  Per Dr. Hyman HopesJegede verbal order cut to 20 mg give 30 day supply and patient needs to make appointment.  Rx sent to our pharmacy

## 2014-11-24 NOTE — Telephone Encounter (Signed)
Patient came into clinic requesting medication refill on lisinopril (PRINIVIL,ZESTRIL) 40 MG tablet . Pt states he needs to be changed to 20 mg instead of 40mg  he has been taking half a pill. Pt uses [facility] pharmacy . Please f/u

## 2014-12-03 ENCOUNTER — Other Ambulatory Visit (HOSPITAL_COMMUNITY): Payer: Self-pay | Admitting: Orthopedic Surgery

## 2014-12-03 NOTE — Progress Notes (Signed)
LVM on pt phone after several unsuccessful attempts to contact pt. Pt instructed not take take any diabetic medications the morning of procedure since he is to have nothing to eat or drink after midnight tonight

## 2014-12-04 ENCOUNTER — Inpatient Hospital Stay (HOSPITAL_COMMUNITY)
Admission: RE | Admit: 2014-12-04 | Discharge: 2014-12-09 | DRG: 475 | Disposition: A | Discharge status: Skilled Nursing Facility | Payer: Medicaid Other | Source: Ambulatory Visit | Attending: Orthopedic Surgery | Admitting: Orthopedic Surgery

## 2014-12-04 ENCOUNTER — Encounter (HOSPITAL_COMMUNITY): Payer: Self-pay | Admitting: Surgery

## 2014-12-04 ENCOUNTER — Encounter (HOSPITAL_COMMUNITY)
Admission: RE | Disposition: A | Discharge status: Skilled Nursing Facility | Payer: Self-pay | Source: Ambulatory Visit | Attending: Orthopedic Surgery

## 2014-12-04 ENCOUNTER — Inpatient Hospital Stay (HOSPITAL_COMMUNITY): Payer: Medicaid Other | Admitting: Anesthesiology

## 2014-12-04 DIAGNOSIS — F17220 Nicotine dependence, chewing tobacco, uncomplicated: Secondary | ICD-10-CM | POA: Diagnosis present

## 2014-12-04 DIAGNOSIS — Z794 Long term (current) use of insulin: Secondary | ICD-10-CM | POA: Diagnosis not present

## 2014-12-04 DIAGNOSIS — I70245 Atherosclerosis of native arteries of left leg with ulceration of other part of foot: Secondary | ICD-10-CM | POA: Diagnosis present

## 2014-12-04 DIAGNOSIS — F17290 Nicotine dependence, other tobacco product, uncomplicated: Secondary | ICD-10-CM | POA: Diagnosis present

## 2014-12-04 DIAGNOSIS — E1142 Type 2 diabetes mellitus with diabetic polyneuropathy: Secondary | ICD-10-CM | POA: Diagnosis present

## 2014-12-04 DIAGNOSIS — E1151 Type 2 diabetes mellitus with diabetic peripheral angiopathy without gangrene: Secondary | ICD-10-CM | POA: Diagnosis present

## 2014-12-04 DIAGNOSIS — M869 Osteomyelitis, unspecified: Principal | ICD-10-CM | POA: Diagnosis present

## 2014-12-04 DIAGNOSIS — K5900 Constipation, unspecified: Secondary | ICD-10-CM | POA: Diagnosis not present

## 2014-12-04 DIAGNOSIS — D62 Acute posthemorrhagic anemia: Secondary | ICD-10-CM | POA: Diagnosis present

## 2014-12-04 DIAGNOSIS — E1165 Type 2 diabetes mellitus with hyperglycemia: Secondary | ICD-10-CM | POA: Diagnosis present

## 2014-12-04 DIAGNOSIS — L03116 Cellulitis of left lower limb: Secondary | ICD-10-CM | POA: Diagnosis present

## 2014-12-04 DIAGNOSIS — L97529 Non-pressure chronic ulcer of other part of left foot with unspecified severity: Secondary | ICD-10-CM | POA: Diagnosis present

## 2014-12-04 DIAGNOSIS — Z89519 Acquired absence of unspecified leg below knee: Secondary | ICD-10-CM

## 2014-12-04 DIAGNOSIS — I10 Essential (primary) hypertension: Secondary | ICD-10-CM | POA: Diagnosis present

## 2014-12-04 DIAGNOSIS — Z79899 Other long term (current) drug therapy: Secondary | ICD-10-CM

## 2014-12-04 HISTORY — PX: AMPUTATION: SHX166

## 2014-12-04 LAB — CBC
HEMATOCRIT: 28.7 % — AB (ref 39.0–52.0)
Hemoglobin: 9.3 g/dL — ABNORMAL LOW (ref 13.0–17.0)
MCH: 27.4 pg (ref 26.0–34.0)
MCHC: 32.4 g/dL (ref 30.0–36.0)
MCV: 84.4 fL (ref 78.0–100.0)
Platelets: 479 10*3/uL — ABNORMAL HIGH (ref 150–400)
RBC: 3.4 MIL/uL — ABNORMAL LOW (ref 4.22–5.81)
RDW: 14.5 % (ref 11.5–15.5)
WBC: 19 10*3/uL — AB (ref 4.0–10.5)

## 2014-12-04 LAB — BASIC METABOLIC PANEL
ANION GAP: 11 (ref 5–15)
BUN: 16 mg/dL (ref 6–20)
CO2: 26 mmol/L (ref 22–32)
Calcium: 8.6 mg/dL — ABNORMAL LOW (ref 8.9–10.3)
Chloride: 95 mmol/L — ABNORMAL LOW (ref 101–111)
Creatinine, Ser: 0.9 mg/dL (ref 0.61–1.24)
GFR calc Af Amer: >60 mL/min (ref >60)
GLUCOSE: 147 mg/dL — AB (ref 70–99)
POTASSIUM: 4.4 mmol/L (ref 3.5–5.1)
Sodium: 132 mmol/L — ABNORMAL LOW (ref 135–145)

## 2014-12-04 LAB — GLUCOSE, CAPILLARY
Glucose-Capillary: 144 mg/dL — ABNORMAL HIGH (ref 70–99)
Glucose-Capillary: 139 mg/dL — ABNORMAL HIGH (ref 70–99)
Glucose-Capillary: 182 mg/dL — ABNORMAL HIGH (ref 70–99)

## 2014-12-04 SURGERY — AMPUTATION BELOW KNEE
Anesthesia: Monitor Anesthesia Care | Laterality: Left

## 2014-12-04 MED ORDER — SODIUM CHLORIDE 0.9 % IV SOLN
10.0000 mg | INTRAVENOUS | Status: DC | PRN
  Administered 2014-12-04: 10 ug/min via INTRAVENOUS

## 2014-12-04 MED ORDER — LIDOCAINE HCL (CARDIAC) 20 MG/ML IV SOLN
INTRAVENOUS | Status: DC | PRN
  Administered 2014-12-04: 50 mg via INTRAVENOUS

## 2014-12-04 MED ORDER — METHOCARBAMOL 500 MG PO TABS
500.0000 mg | ORAL_TABLET | Freq: Four times a day (QID) | ORAL | Status: DC | PRN
  Administered 2014-12-05 – 2014-12-09 (×9): 500 mg via ORAL
  Filled 2014-12-04 (×10): qty 1

## 2014-12-04 MED ORDER — PROPOFOL INFUSION 10 MG/ML OPTIME
INTRAVENOUS | Status: DC | PRN
  Administered 2014-12-04: 75 ug/min via INTRAVENOUS

## 2014-12-04 MED ORDER — PHENYLEPHRINE HCL 10 MG/ML IJ SOLN
INTRAMUSCULAR | Status: DC | PRN
  Administered 2014-12-04 (×2): 80 ug via INTRAVENOUS

## 2014-12-04 MED ORDER — 0.9 % SODIUM CHLORIDE (POUR BTL) OPTIME
TOPICAL | Status: DC | PRN
  Administered 2014-12-04: 1000 mL

## 2014-12-04 MED ORDER — MIDAZOLAM HCL 2 MG/2ML IJ SOLN
INTRAMUSCULAR | Status: AC
  Filled 2014-12-04: qty 2

## 2014-12-04 MED ORDER — ONDANSETRON HCL 4 MG/2ML IJ SOLN
INTRAMUSCULAR | Status: DC | PRN
  Administered 2014-12-04: 4 mg via INTRAVENOUS

## 2014-12-04 MED ORDER — ONDANSETRON HCL 4 MG/2ML IJ SOLN: 4.0000 mg | Freq: Four times a day (QID) | INTRAMUSCULAR | Status: DC | PRN

## 2014-12-04 MED ORDER — ACETAMINOPHEN 160 MG/5ML PO SOLN
325.0000 mg | ORAL | Status: DC | PRN
  Filled 2014-12-04: qty 20.3

## 2014-12-04 MED ORDER — FENTANYL CITRATE (PF) 250 MCG/5ML IJ SOLN
INTRAMUSCULAR | Status: AC
  Filled 2014-12-04: qty 5

## 2014-12-04 MED ORDER — OXYCODONE HCL 5 MG/5ML PO SOLN: 5.0000 mg | Freq: Once | ORAL | Status: DC | PRN

## 2014-12-04 MED ORDER — HYDROMORPHONE HCL 1 MG/ML IJ SOLN: 1.0000 mg | INTRAMUSCULAR | Status: DC | PRN

## 2014-12-04 MED ORDER — OXYCODONE HCL 5 MG PO TABS: 5.0000 mg | ORAL_TABLET | Freq: Once | ORAL | Status: DC | PRN

## 2014-12-04 MED ORDER — ONDANSETRON HCL 4 MG PO TABS: 4.0000 mg | ORAL_TABLET | Freq: Four times a day (QID) | ORAL | Status: DC | PRN

## 2014-12-04 MED ORDER — CEFAZOLIN SODIUM-DEXTROSE 2-3 GM-% IV SOLR
2.0000 g | INTRAVENOUS | Status: DC
  Filled 2014-12-04: qty 50

## 2014-12-04 MED ORDER — ACETAMINOPHEN 325 MG PO TABS: 650.0000 mg | ORAL_TABLET | Freq: Four times a day (QID) | ORAL | Status: DC | PRN

## 2014-12-04 MED ORDER — INSULIN ASPART 100 UNIT/ML ~~LOC~~ SOLN
4.0000 [IU] | Freq: Three times a day (TID) | SUBCUTANEOUS | Status: DC
  Administered 2014-12-05 – 2014-12-08 (×8): 4 [IU] via SUBCUTANEOUS

## 2014-12-04 MED ORDER — INSULIN DETEMIR 100 UNIT/ML ~~LOC~~ SOLN
15.0000 [IU] | Freq: Every day | SUBCUTANEOUS | Status: DC
  Administered 2014-12-04 – 2014-12-08 (×4): 15 [IU] via SUBCUTANEOUS
  Filled 2014-12-04 (×6): qty 0.15

## 2014-12-04 MED ORDER — ACETAMINOPHEN 650 MG RE SUPP: 650.0000 mg | Freq: Four times a day (QID) | RECTAL | Status: DC | PRN

## 2014-12-04 MED ORDER — DEXTROSE 5 % IV SOLN
500.0000 mg | Freq: Four times a day (QID) | INTRAVENOUS | Status: DC | PRN
  Filled 2014-12-04: qty 5

## 2014-12-04 MED ORDER — SODIUM CHLORIDE 0.9 % IV SOLN: INTRAVENOUS | Status: DC

## 2014-12-04 MED ORDER — METOCLOPRAMIDE HCL 5 MG/ML IJ SOLN: 5.0000 mg | Freq: Three times a day (TID) | INTRAMUSCULAR | Status: DC | PRN

## 2014-12-04 MED ORDER — LIDOCAINE-EPINEPHRINE (PF) 1.5 %-1:200000 IJ SOLN
INTRAMUSCULAR | Status: DC | PRN
  Administered 2014-12-04: 5 mL via PERINEURAL
  Administered 2014-12-04: 10 mL via PERINEURAL

## 2014-12-04 MED ORDER — LACTATED RINGERS IV SOLN
INTRAVENOUS | Status: DC
  Administered 2014-12-04 (×2): via INTRAVENOUS

## 2014-12-04 MED ORDER — HYDROMORPHONE HCL 1 MG/ML IJ SOLN: 0.2500 mg | INTRAMUSCULAR | Status: DC | PRN

## 2014-12-04 MED ORDER — ACETAMINOPHEN 325 MG PO TABS: 325.0000 mg | ORAL_TABLET | ORAL | Status: DC | PRN

## 2014-12-04 MED ORDER — INSULIN ASPART 100 UNIT/ML ~~LOC~~ SOLN
0.0000 [IU] | Freq: Three times a day (TID) | SUBCUTANEOUS | Status: DC
  Administered 2014-12-05: 2 [IU] via SUBCUTANEOUS
  Administered 2014-12-06: 3 [IU] via SUBCUTANEOUS
  Administered 2014-12-07: 2 [IU] via SUBCUTANEOUS
  Administered 2014-12-07 – 2014-12-08 (×2): 3 [IU] via SUBCUTANEOUS

## 2014-12-04 MED ORDER — METOCLOPRAMIDE HCL 5 MG PO TABS: 5.0000 mg | ORAL_TABLET | Freq: Three times a day (TID) | ORAL | Status: DC | PRN

## 2014-12-04 MED ORDER — LISINOPRIL 20 MG PO TABS
20.0000 mg | ORAL_TABLET | Freq: Every day | ORAL | Status: DC
  Administered 2014-12-05 – 2014-12-09 (×5): 20 mg via ORAL
  Filled 2014-12-04 (×5): qty 1

## 2014-12-04 MED ORDER — CEFAZOLIN SODIUM 1-5 GM-% IV SOLN
1.0000 g | Freq: Four times a day (QID) | INTRAVENOUS | Status: AC
  Administered 2014-12-04 – 2014-12-05 (×3): 1 g via INTRAVENOUS
  Filled 2014-12-04 (×4): qty 50

## 2014-12-04 MED ORDER — OXYCODONE HCL 5 MG PO TABS
5.0000 mg | ORAL_TABLET | ORAL | Status: DC | PRN
  Administered 2014-12-04: 10 mg via ORAL
  Administered 2014-12-05: 5 mg via ORAL
  Administered 2014-12-05 – 2014-12-09 (×9): 10 mg via ORAL
  Filled 2014-12-04 (×3): qty 2
  Filled 2014-12-04: qty 1
  Filled 2014-12-04 (×7): qty 2

## 2014-12-04 MED ORDER — BUPIVACAINE-EPINEPHRINE (PF) 0.5% -1:200000 IJ SOLN
INTRAMUSCULAR | Status: DC | PRN
  Administered 2014-12-04 (×2): 20 mL via PERINEURAL

## 2014-12-04 MED ORDER — MIDAZOLAM HCL 2 MG/2ML IJ SOLN
INTRAMUSCULAR | Status: AC
  Administered 2014-12-04: 1 mg via INTRAVENOUS
  Administered 2014-12-04: 2 mg via INTRAVENOUS
  Administered 2014-12-04: 1 mg via INTRAVENOUS
  Filled 2014-12-04: qty 2

## 2014-12-04 MED ORDER — FENTANYL CITRATE (PF) 100 MCG/2ML IJ SOLN
INTRAMUSCULAR | Status: AC
  Administered 2014-12-04 (×2): 50 ug via INTRAVENOUS
  Filled 2014-12-04: qty 2

## 2014-12-04 SURGICAL SUPPLY — 38 items
BLADE SAW RECIP 87.9 MT (BLADE) ×3 IMPLANT
BLADE SURG 21 STRL SS (BLADE) ×3 IMPLANT
BNDG COHESIVE 4X5 TAN STRL (GAUZE/BANDAGES/DRESSINGS) ×2 IMPLANT
BNDG COHESIVE 6X5 TAN STRL LF (GAUZE/BANDAGES/DRESSINGS) ×6 IMPLANT
BNDG GAUZE ELAST 4 BULKY (GAUZE/BANDAGES/DRESSINGS) ×4 IMPLANT
COVER SURGICAL LIGHT HANDLE (MISCELLANEOUS) ×6 IMPLANT
CUFF TOURNIQUET SINGLE 34IN LL (TOURNIQUET CUFF) IMPLANT
CUFF TOURNIQUET SINGLE 44IN (TOURNIQUET CUFF) IMPLANT
DRAPE EXTREMITY T 121X128X90 (DRAPE) ×3 IMPLANT
DRAPE PROXIMA HALF (DRAPES) ×6 IMPLANT
DRAPE U-SHAPE 47X51 STRL (DRAPES) ×3 IMPLANT
DRSG ADAPTIC 3X8 NADH LF (GAUZE/BANDAGES/DRESSINGS) ×3 IMPLANT
DRSG PAD ABDOMINAL 8X10 ST (GAUZE/BANDAGES/DRESSINGS) ×3 IMPLANT
DURAPREP 26ML APPLICATOR (WOUND CARE) ×3 IMPLANT
ELECT REM PT RETURN 9FT ADLT (ELECTROSURGICAL) ×3
ELECTRODE REM PT RTRN 9FT ADLT (ELECTROSURGICAL) ×1 IMPLANT
GAUZE SPONGE 4X4 12PLY STRL (GAUZE/BANDAGES/DRESSINGS) ×3 IMPLANT
GLOVE BIOGEL PI IND STRL 9 (GLOVE) ×1 IMPLANT
GLOVE BIOGEL PI INDICATOR 9 (GLOVE) ×2
GLOVE SURG ORTHO 9.0 STRL STRW (GLOVE) ×3 IMPLANT
GOWN STRL REUS W/ TWL XL LVL3 (GOWN DISPOSABLE) ×2 IMPLANT
GOWN STRL REUS W/TWL XL LVL3 (GOWN DISPOSABLE) ×6
KIT BASIN OR (CUSTOM PROCEDURE TRAY) ×3 IMPLANT
KIT ROOM TURNOVER OR (KITS) ×3 IMPLANT
MANIFOLD NEPTUNE II (INSTRUMENTS) ×3 IMPLANT
NS IRRIG 1000ML POUR BTL (IV SOLUTION) ×3 IMPLANT
PACK GENERAL/GYN (CUSTOM PROCEDURE TRAY) ×3 IMPLANT
PAD ARMBOARD 7.5X6 YLW CONV (MISCELLANEOUS) ×6 IMPLANT
SPONGE GAUZE 4X4 12PLY STER LF (GAUZE/BANDAGES/DRESSINGS) ×2 IMPLANT
SPONGE LAP 18X18 X RAY DECT (DISPOSABLE) IMPLANT
STAPLER VISISTAT 35W (STAPLE) IMPLANT
STOCKINETTE IMPERVIOUS LG (DRAPES) ×3 IMPLANT
SUT SILK 2 0 (SUTURE) ×3
SUT SILK 2-0 18XBRD TIE 12 (SUTURE) ×1 IMPLANT
SUT VIC AB 1 CTX 27 (SUTURE) IMPLANT
TOWEL OR 17X24 6PK STRL BLUE (TOWEL DISPOSABLE) ×3 IMPLANT
TOWEL OR 17X26 10 PK STRL BLUE (TOWEL DISPOSABLE) ×3 IMPLANT
WATER STERILE IRR 1000ML POUR (IV SOLUTION) ×3 IMPLANT

## 2014-12-04 NOTE — Anesthesia Preprocedure Evaluation (Signed)
Anesthesia Evaluation  Patient identified by MRN, date of birth, ID band Patient awake    Reviewed: Allergy & Precautions, NPO status , Patient's Chart, lab work & pertinent test results  History of Anesthesia Complications Negative for: history of anesthetic complications  Airway Mallampati: II  TM Distance: >3 FB Neck ROM: Full    Dental  (+) Teeth Intact   Pulmonary neg pulmonary ROS,  breath sounds clear to auscultation        Cardiovascular hypertension, Pt. on medications - angina+ Peripheral Vascular Disease - Past MI and - CHF Rhythm:Regular     Neuro/Psych negative neurological ROS  negative psych ROS   GI/Hepatic negative GI ROS, Neg liver ROS,   Endo/Other  diabetes, Type 2, Insulin Dependent  Renal/GU negative Renal ROS     Musculoskeletal  (+) Arthritis -,   Abdominal   Peds  Hematology negative hematology ROS (+)   Anesthesia Other Findings   Reproductive/Obstetrics                             Anesthesia Physical Anesthesia Plan  ASA: III  Anesthesia Plan: MAC and Regional   Post-op Pain Management:    Induction: Intravenous  Airway Management Planned: Natural Airway  Additional Equipment: None  Intra-op Plan:   Post-operative Plan:   Informed Consent: I have reviewed the patients History and Physical, chart, labs and discussed the procedure including the risks, benefits and alternatives for the proposed anesthesia with the patient or authorized representative who has indicated his/her understanding and acceptance.   Dental advisory given  Plan Discussed with: Surgeon and CRNA  Anesthesia Plan Comments:         Anesthesia Quick Evaluation

## 2014-12-04 NOTE — Anesthesia Postprocedure Evaluation (Signed)
  Anesthesia Post-op Note  Patient: Ryan [name]  Procedure(s) Performed: Procedure(s): AMPUTATION BELOW KNEE (Left)  Patient Location: PACU  Anesthesia Type:MAC and Regional  Level of Consciousness: awake  Airway and Oxygen Therapy: Patient Spontanous Breathing  Post-op Pain: none  Post-op Assessment: Post-op Vital signs reviewed, Patient's Cardiovascular Status Stable, Respiratory Function Stable, Patent Airway, No signs of Nausea or vomiting and Pain level controlled  Post-op Vital Signs: Reviewed and stable  Last Vitals:  Filed Vitals:   12/04/14 1720  BP: 92/59  Pulse: 85  Temp:   Resp: 16    Complications: No apparent anesthesia complications

## 2014-12-04 NOTE — Op Note (Signed)
   Date of Surgery: 12/04/2014  INDICATIONS: Mr. Darcel BayleyLeonard is a 54 y.o.-year-old male who osteomyelitis ulceration left foot. Patient has failed foot salvage intervention.  PREOPERATIVE DIAGNOSIS: Osteomyelitis ulceration left foot.  POSTOPERATIVE DIAGNOSIS: Same.  PROCEDURE: Transtibial amputation on the left.  SURGEON: Lajoyce Cornersuda, M.D.  ANESTHESIA:  general  IV FLUIDS AND URINE: See anesthesia.  ESTIMATED BLOOD LOSS: Minimal mL.  COMPLICATIONS: None.  DESCRIPTION OF PROCEDURE: The patient was brought to the operating room and underwent a general anesthetic. After adequate levels of anesthesia were obtained patient's lower extremity was prepped using DuraPrep draped into a sterile field. A timeout was called.  A transverse incision was made 11 cm distal to the tibial tubercle. This curved proximally and a large posterior flap was created. The tibia was transected 1 cm proximal to the skin incision. The fibula was transected just proximal to the tibial incision. The tibia was beveled anteriorly. A large posterior flap was created. The sciatic nerve was pulled cut and allowed to retract. The vascular bundles were suture ligated with 2-0 silk. The deep and superficial fascial layers were closed using #1 Vicryl. The skin was closed using staples and 2-0 nylon. The wound was covered with Adaptic orthopedic sponges AB dressing Kerlix and Coban. Patient was extubated taken to the PACU in stable condition.  Aldean BakerMarcus Debara Kamphuis, MD [facility] 4:26 PM

## 2014-12-04 NOTE — H&P (Signed)
Ryan [name] is an 54 y.o. male.   Chief Complaint: Ulceration osteomyelitis right foot HPI: Patient is a 54 year old gentleman with diabetic insensate neuropathy status post multiple foot salvage intervention surgeries on the right who presents at this time with persistent osteomyelitis and ulceration.  Past Medical History  Diagnosis Date  . Hypertension   . Osteomyelitis of foot   . Heart murmur     was told "not to worry about it"  . Diabetes mellitus without complication     type 2  . Diabetic neuropathy   . Arthritis   . Anemia     low iron  . Peripheral vascular disease     "poor circulation" in feet and necrosis    Past Surgical History  Procedure Laterality Date  . Amputation Right 01/13/2014    Procedure: Right great toe amputation;  Surgeon: Jacki Conesonald A Gioffre, MD;  Location: [facility];  Service: Orthopedics;  Laterality: Right;  . Knee surgery Right   . Arm surgery Left     due to broken arm  . Amputation Left 06/08/2014    Procedure: AMPUTATION LEFT GREAT TOE;  Surgeon: Verlee RossettiSteven R Norris, MD;  Location: [facility];  Service: Orthopedics;  Laterality: Left;  . Amputation Left 07/01/2014    Procedure: LEFT First Metatarsal RAY AMPUTATION ;  Surgeon: Verlee RossettiSteven R Norris, MD;  [facility];  Service: Orthopedics;  Laterality: Left;  . Amputation Left 08/28/2014    Procedure: Left Foot Fifth ray resection;  Surgeon: Kathryne Hitchhristopher Y Blackman, MD;  Location: [facility];  Service: Orthopedics;  Laterality: Left;  . Amputation Left 10/08/2014    Procedure: LEFT FOOT TRANSMETATARSAL AMPUTATION;  Surgeon: Kathryne Hitchhristopher Y Blackman, MD;  [facility];  Service: Orthopedics;  Laterality: Left;    Family History  Problem Relation Age of Onset  . Family history unknown: Yes   Social History:  reports that he has never smoked. His smokeless tobacco use includes Snuff. He reports that he does not drink alcohol or use illicit drugs.  Allergies:  Allergies  Allergen Reactions  . Metformin And  Related Other (See Comments)    Pt states that metformin made sugars fall into 30's    No prescriptions prior to admission    No results found for this or any previous visit (from the past 48 hour(s)). No results found.  Review of Systems  All other systems reviewed and are negative.   There were no vitals taken for this visit. Physical Exam  On examination patient has exposed bone radiograph shows destructive changes of the cuboid with drainage cellulitis and ulceration Assessment/Plan Assessment osteomyelitis ulceration cellulitis left foot.  Plan: We will plan for left transtibial amputation. Risks and benefits were discussed including risk of the wound not healing need for additional surgery. Patient states he understands and wishes to proceed at this time.  Razan Siler V 12/04/2014, 6:40 AM

## 2014-12-04 NOTE — Anesthesia Procedure Notes (Signed)
Anesthesia Regional Block:  Popliteal block  Pre-Anesthetic Checklist: ,, timeout performed, Correct Patient, Correct Site, Correct Laterality, Correct Procedure, Correct Position, site marked, Risks and benefits discussed,  Surgical consent,  Pre-op evaluation,  At surgeon's request and post-op pain management  Laterality: Lower and Left  Prep: chloraprep       Needles:  Injection technique: Single-shot  Needle Type: Echogenic Stimulator Needle          Additional Needles:  Procedures: ultrasound guided (picture in chart) and nerve stimulator Popliteal block  Nerve Stimulator or Paresthesia:  Response: plantar, 0.5 mA,   Additional Responses:   Narrative:  Injection made incrementally with aspirations every 5 mL.  Performed by: Personally  Anesthesiologist: Arayla Kruschke, CHRIS  Additional Notes: H+P and labs reviewed, risks and benefits discussed with patient, procedure tolerated well without complications   Anesthesia Regional Block:  Femoral nerve block  Pre-Anesthetic Checklist: ,, timeout performed, Correct Patient, Correct Site, Correct Laterality, Correct Procedure, Correct Position, site marked, Risks and benefits discussed,  Surgical consent,  Pre-op evaluation,  At surgeon's request and post-op pain management  Laterality: Lower and Left  Prep: chloraprep       Needles:  Injection technique: Single-shot  Needle Type: Echogenic Stimulator Needle          Additional Needles:  Procedures: ultrasound guided (picture in chart) and nerve stimulator Femoral nerve block  Nerve Stimulator or Paresthesia:  Response: quad, 0.5 mA,   Additional Responses:   Narrative:  Injection made incrementally with aspirations every 5 mL.  Performed by: Personally  Anesthesiologist: Teofila Bowery, CHRIS  Additional Notes: H+P and labs reviewed, risks and benefits discussed with patient, procedure tolerated well without complications

## 2014-12-04 NOTE — Transfer of Care (Signed)
Immediate Anesthesia Transfer of Care Note  Patient: Ryan [name]  Procedure(s) Performed: Procedure(s): AMPUTATION BELOW KNEE (Left)  Patient Location: PACU  Anesthesia Type:MAC  Level of Consciousness: awake, alert , oriented and patient cooperative  Airway & Oxygen Therapy: Patient Spontanous Breathing and Patient connected to nasal cannula oxygen  Post-op Assessment: Report given to RN and Post -op Vital signs reviewed and stable  Post vital signs: Reviewed and stable  Last Vitals:  Filed Vitals:   12/04/14 1234  BP: 199/77  Pulse: 99  Temp: 37.4 C  Resp: 20    Complications: No apparent anesthesia complications

## 2014-12-05 LAB — GLUCOSE, CAPILLARY
GLUCOSE-CAPILLARY: 75 mg/dL (ref 70–99)
GLUCOSE-CAPILLARY: 171 mg/dL — AB (ref 70–99)
Glucose-Capillary: 72 mg/dL (ref 70–99)
Glucose-Capillary: 88 mg/dL (ref 70–99)

## 2014-12-05 MED ORDER — OXYCODONE HCL 5 MG PO TABS: 5.0000 mg | ORAL_TABLET | ORAL | Status: DC | PRN

## 2014-12-05 NOTE — Progress Notes (Signed)
Dressing is c/d/i.  Patient is moving around in the bed well, NAD. Up with PT.  Likely a good candidate for CIR.  Mayra ReelN. Michael Vertis Bauder, MD [facility] [phone] 8:50 AM

## 2014-12-05 NOTE — Evaluation (Addendum)
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 Pt currently with functional limitations due to the deficits listed below (see PT Problem List).  Pt will benefit from skilled PT to increase their independence and safety with mobility to allow discharge to the venue listed below. Pt educated on phantom pain, tactile/pressure to residual limb, and positioning. Pt is moving well for first day post-op, but does demonstrate decreased safety and had 2 small LOB when R foot did not clear floor.  He lives alone in a mobile home and does not have a lot of room to maneuver.  At this time recommend CIR.     Follow Up Recommendations CIR    Equipment Recommendations  Rolling walker with 5" wheels    Recommendations for Other Services       Precautions / Restrictions Precautions Precautions: Fall Restrictions Weight Bearing Restrictions: Yes LLE Weight Bearing: Non weight bearing Other Position/Activity Restrictions: L BKA      Mobility  Bed Mobility Overal bed mobility: Needs Assistance Bed Mobility: Supine to Sit     Supine to sit: Supervision;HOB elevated     General bed mobility comments: HOB elevated and with rails  Transfers Overall transfer level: Needs assistance Equipment used: Rolling walker (2 wheeled) Transfers: Sit to/from Stand Sit to Stand: Min assist         General transfer comment: min A for steadying once up, but able to power up with min/guard  Ambulation/Gait Ambulation/Gait assistance: Min guard;Min assist Ambulation Distance (Feet): 75 Feet Assistive device: Rolling  walker (2 wheeled) Gait Pattern/deviations: Step-to pattern Gait velocity: quick and a little impulsive   General Gait Details: Pt needing cues to not let R foot pass front of RW and stay within BOS. Pt with 2 small LOB when R foot did not fully clear floor  Stairs            Wheelchair Mobility    Modified Rankin (Stroke Patients Only)       Balance Overall balance assessment: Needs assistance   Sitting balance-Leahy Scale: Fair       Standing balance-Leahy Scale: Poor Standing balance comment: requires RW                             Pertinent Vitals/Pain Pain Assessment: 0-10 Pain Score: 3  Pain Location: phantom pain Pain Descriptors / Indicators: Pressure Pain Intervention(s): Limited activity within patient's tolerance;Repositioned    Home Living Family/patient expects to be discharged to:: Inpatient rehab                 Additional Comments: Lived alone in mobile home with 3 steps to enter    Prior Function           Comments: Amb with crutches due to ostemyelitis and foot surgeries     Hand Dominance        Extremity/Trunk Assessment               Lower Extremity Assessment: LLE deficits/detail;RLE deficits/detail RLE Deficits / Details: R toe amputation LLE Deficits / Details: L BKA with good quad set and SLR. Limited L knee  flexion due to bandaging, but ~ 40-45 degrees     Communication   Communication: No difficulties  Cognition Arousal/Alertness: Awake/alert Behavior During Therapy: WFL for tasks assessed/performed Overall Cognitive Status: Within Functional Limits for tasks assessed                      General Comments      Exercises Amputee Exercises Quad Sets: Strengthening;Left;20 reps Knee Flexion: AROM;10 reps;Left Straight Leg Raises: Strengthening;Left;20 reps      Assessment/Plan    PT Assessment Patient needs continued PT services  PT Diagnosis Difficulty walking   PT Problem  List Decreased strength;Decreased range of motion;Decreased balance;Decreased mobility;Decreased coordination  PT Treatment Interventions DME instruction;Gait training;Functional mobility training;Therapeutic activities;Therapeutic exercise   PT Goals (Current goals can be found in the Care Plan section) Acute Rehab PT Goals Patient Stated Goal: To be able to get around by himself PT Goal Formulation: With patient Time For Goal Achievement: 12/19/14 Potential to Achieve Goals: Good    Frequency Min 5X/week   Barriers to discharge Decreased caregiver support;Inaccessible home environment Lives alone and states a walker will not work in his mobile home    Co-evaluation               End of Session Equipment Utilized During Treatment: Gait belt Activity Tolerance: Patient tolerated treatment well Patient left: in chair;with call bell/phone within reach Nurse Communication: Mobility status         Time: 1041-1104 PT Time Calculation (min) (ACUTE ONLY): 23 min   Charges:   PT Evaluation $Initial PT Evaluation Tier I: 1 Procedure PT Treatments $Gait Training: 8-22 mins   PT G Codes:        Ryan [name] 12/05/2014, 11:44 AM

## 2014-12-06 DIAGNOSIS — Z89512 Acquired absence of left leg below knee: Secondary | ICD-10-CM

## 2014-12-06 LAB — GLUCOSE, CAPILLARY
GLUCOSE-CAPILLARY: 155 mg/dL — AB (ref 70–99)
GLUCOSE-CAPILLARY: 59 mg/dL — AB (ref 70–99)
Glucose-Capillary: 56 mg/dL — ABNORMAL LOW (ref 70–99)
Glucose-Capillary: 186 mg/dL — ABNORMAL HIGH (ref 70–99)
Glucose-Capillary: 85 mg/dL (ref 70–99)

## 2014-12-06 NOTE — Consult Note (Signed)
Physical Medicine and Rehabilitation Consult Reason for Consult: Left transtibial amputation Referring Physician: Dr. Lajoyce Cornersuda   HPI: Ryan [name] is a 54 y.o. right handed male with history of hypertension, diabetes mellitus and peripheral neuropathy, right and left great toe amputations. Lives alone and used crutches. Presented 12/04/2014 with ulceration and osteomyelitis of the left foot and poor healing with left great toe amputation 06/08/2014 and multiple resections. Underwent transtibial amputation on the left 12/04/2014 per Dr. Lajoyce Cornersuda. Hospital course pain management. Acute blood loss anemia 9.3 and monitored. Physical therapy evaluation completed 12/05/2014 with recommendations of physical medicine rehabilitation consult.   Review of Systems  Gastrointestinal: Positive for constipation.  Musculoskeletal: Positive for myalgias and joint pain.  All other systems reviewed and are negative.  Past Medical History  Diagnosis Date  . Hypertension   . Osteomyelitis of foot   . Heart murmur     was told "not to worry about it"  . Diabetes mellitus without complication     type 2  . Diabetic neuropathy   . Arthritis   . Anemia     low iron  . Peripheral vascular disease     "poor circulation" in feet and necrosis   Past Surgical History  Procedure Laterality Date  . Amputation Right 01/13/2014    Procedure: Right great toe amputation;  Surgeon: Jacki Conesonald A Gioffre, MD;  Location: [facility];  Service: Orthopedics;  Laterality: Right;  . Knee surgery Right   . Arm surgery Left     due to broken arm  . Amputation Left 06/08/2014    Procedure: AMPUTATION LEFT GREAT TOE;  Surgeon: Verlee RossettiSteven R Norris, MD;  Location: [facility];  Service: Orthopedics;  Laterality: Left;  . Amputation Left 07/01/2014    Procedure: LEFT First Metatarsal RAY AMPUTATION ;  Surgeon: Verlee RossettiSteven R Norris, MD;  [facility];  Service: Orthopedics;  Laterality: Left;  . Amputation Left 08/28/2014    Procedure: Left Foot  Fifth ray resection;  Surgeon: Kathryne Hitchhristopher Y Blackman, MD;  Location: [facility];  Service: Orthopedics;  Laterality: Left;  . Amputation Left 10/08/2014    Procedure: LEFT FOOT TRANSMETATARSAL AMPUTATION;  Surgeon: Kathryne Hitchhristopher Y Blackman, MD;  [facility];  Service: Orthopedics;  Laterality: Left;   Family History  Problem Relation Age of Onset  . Family history unknown: Yes   Social History:  reports that he has never smoked. His smokeless tobacco use includes Snuff. He reports that he does not drink alcohol or use illicit drugs. Allergies:  Allergies  Allergen Reactions  . Metformin And Related Other (See Comments)    Pt states that metformin made sugars fall into 30's   Medications Prior to Admission  Medication Sig Dispense Refill  . cephALEXin (KEFLEX) 500 MG capsule Take 500 mg by mouth 3 (three) times daily. 7 day course started 12/03/14 pm    . ibuprofen (ADVIL,MOTRIN) 200 MG tablet Take 400 mg by mouth every 6 (six) hours as needed for fever (pain).    . insulin detemir (LEVEMIR) 100 UNIT/ML injection Inject 0.15 mLs (15 Units total) into the skin at bedtime. (Patient taking differently: Inject 15 Units into the skin daily. ) 4 vial 3  . lisinopril (PRINIVIL,ZESTRIL) 20 MG tablet Take 1 tablet (20 mg total) by mouth daily. 30 tablet 0  . oxyCODONE-acetaminophen (PERCOCET/ROXICET) 5-325 MG per tablet Take 1-2 tablets by mouth every 4 (four) hours as needed for severe pain.    . Probiotic Product (PROBIOTIC PO) Take 1  tablet by mouth daily after lunch.      Home: Home Living Family/patient expects to be discharged to:: Inpatient rehab Additional Comments: Lived alone in mobile home with 3 steps to enter  Functional History: Prior Function Comments: Amb with crutches due to ostemyelitis and foot surgeries Functional Status:  Mobility: Bed Mobility Overal bed mobility: Needs Assistance Bed Mobility: Supine to Sit Supine to sit: Supervision, HOB elevated General bed mobility  comments: HOB elevated and with rails Transfers Overall transfer level: Needs assistance Equipment used: Rolling walker (2 wheeled) Transfers: Sit to/from Stand Sit to Stand: Min assist General transfer comment: min A for steadying once up, but able to power up with min/guard Ambulation/Gait Ambulation/Gait assistance: Min guard, Min assist Ambulation Distance (Feet): 75 Feet Assistive device: Rolling walker (2 wheeled) General Gait Details: Pt needing cues to not let R foot pass front of RW and stay within BOS. Pt with 2 small LOB when R foot did not fully clear floor Gait Pattern/deviations: Step-to pattern Gait velocity: quick and a little impulsive    ADL:    Cognition: Cognition Overall Cognitive Status: Within Functional Limits for tasks assessed Orientation Level: Oriented X4 Cognition Arousal/Alertness: Awake/alert Behavior During Therapy: WFL for tasks assessed/performed Overall Cognitive Status: Within Functional Limits for tasks assessed  Blood pressure 171/76, pulse 90, temperature 98 F (36.7 C), temperature source Oral, resp. rate 18, height 5\' 11"  (1.803 m), weight 74.39 kg (164 lb), SpO2 99 %. Physical Exam  Constitutional: He is oriented to person, place, and time. He appears well-developed.  HENT:  Head: Normocephalic.  Eyes: EOM are normal.  Neck: Normal range of motion. Neck supple. No thyromegaly present.  Cardiovascular: Normal rate and regular rhythm.   Respiratory: Effort normal and breath sounds normal. No respiratory distress.  GI: Soft. Bowel sounds are normal. He exhibits no distension.  Neurological: He is alert and oriented to person, place, and time.  5/5 upper ext. RLE: 4+ to 5/5. ?senses pain and LT distally. Cognitively appropriate  Skin:  BKA site is dressed appropriately tender  Psychiatric:  Anxious, needs redirection    Results for orders placed or performed during the hospital encounter of 12/04/14 (from the past 24 hour(s))    Glucose, capillary     Status: None   Collection Time: 12/05/14 11:23 AM  Result Value Ref Range   Glucose-Capillary 72 70 - 99 mg/dL  Glucose, capillary     Status: Abnormal   Collection Time: 12/05/14  3:37 PM  Result Value Ref Range   Glucose-Capillary 171 (H) 70 - 99 mg/dL  Glucose, capillary     Status: None   Collection Time: 12/05/14  9:19 PM  Result Value Ref Range   Glucose-Capillary 88 70 - 99 mg/dL  Glucose, capillary     Status: Abnormal   Collection Time: 12/06/14  6:54 AM  Result Value Ref Range   Glucose-Capillary 56 (L) 70 - 99 mg/dL  Glucose, capillary     Status: Abnormal   Collection Time: 12/06/14  8:12 AM  Result Value Ref Range   Glucose-Capillary 155 (H) 70 - 99 mg/dL   No results found.  Assessment/Plan: Diagnosis: left BKA 1. Does the need for close, 24 hr/day medical supervision in concert with the patient's rehab needs make it unreasonable for this patient to be served in a less intensive setting? No 2. Co-Morbidities requiring supervision/potential complications:   3. Due to bladder management, bowel management, safety and skin/wound care, does the patient require 24 hr/day rehab nursing?  No 4. Does the patient require coordinated care of a physician, rehab nurse, n/a to address physical and functional deficits in the context of the above medical diagnosis(es)? No Addressing deficits in the following areas: balance, endurance and locomotion 5. Can the patient actively participate in an intensive therapy program of at least 3 hrs of therapy per day at least 5 days per week? Potentially 6. The potential for patient to make measurable gains while on inpatient rehab is fair 7. Anticipated functional outcomes upon discharge from inpatient rehab are n/a  with PT, n/a with OT, n/a with SLP. 8. Estimated rehab length of stay to reach the above functional goals is:   9. Does the patient have adequate social supports and living environment to accommodate these  discharge functional goals? Yes 10. Anticipated D/C setting: Home 11. Anticipated post D/C treatments: HH therapy 12. Overall Rehab/Functional Prognosis: excellent  RECOMMENDATIONS: This patient's condition is appropriate for continued rehabilitative care in the following setting: [facility] Therapy Patient has agreed to participate in recommended program. Yes Note that insurance prior authorization may be required for reimbursement for recommended care.  Comment: Pt has been getting up to bathroom and around room with supervision only. toileting himself (per pt).  He is not likely to require an inpatient rehab stay to return home to his mobile home.  Would work on STE (3-4) while on acute and then dc home.   Ranelle Oyster, MD, [facility] Physical Medicine & Rehabilitation 12/07/2014     12/06/2014

## 2014-12-06 NOTE — Progress Notes (Signed)
Patient is very mobile and doing well.  Pain controlled.  CIR pending.  Mayra ReelN. Michael Zamiya Dillard, MD [facility] [phone] 9:05 AM

## 2014-12-07 ENCOUNTER — Encounter (HOSPITAL_COMMUNITY): Payer: Self-pay | Admitting: Orthopedic Surgery

## 2014-12-07 LAB — GLUCOSE, CAPILLARY
Glucose-Capillary: 116 mg/dL — ABNORMAL HIGH (ref 70–99)
Glucose-Capillary: 122 mg/dL — ABNORMAL HIGH (ref 70–99)
Glucose-Capillary: 144 mg/dL — ABNORMAL HIGH (ref 70–99)
Glucose-Capillary: 132 mg/dL — ABNORMAL HIGH (ref 70–99)

## 2014-12-07 NOTE — Clinical Social Work Placement (Signed)
   CLINICAL SOCIAL WORK PLACEMENT  NOTE  Date:  12/07/2014  Patient Details  Name: Ryan [name] MRN: [id number] Date of Birth: 1960-10-03  Clinical Social Work is seeking post-discharge placement for this patient at the Skilled  Nursing Facility level of care (*CSW will initial, date and re-position this form in  chart as items are completed):  No   Patient/family provided with [facility] Clinical Social Work Department's list of facilities offering this level of care within the geographic area requested by the patient (or if unable, by the patient's family).  No   Patient/family informed of their freedom to choose among providers that offer the needed level of care, that participate in Medicare, Medicaid or managed care program needed by the patient, have an available bed and are willing to accept the patient.  No   Patient/family informed of 's ownership interest in [facility] and [facility], as well as of the fact that they are under no obligation to receive care at these facilities.  PASRR submitted to EDS on 12/07/14     PASRR number received on 12/07/14     Existing PASRR number confirmed on       FL2 transmitted to all facilities in geographic area requested by pt/family on  5/9/  FL2 transmitted to all facilities within larger geographic area on 12/07/14     Patient informed that his/her managed care company has contracts with or will negotiate with certain facilities, including the following:   (NA. No insurance.)       Patient/family informed of bed offers received.  Patient chooses bed at       Physician recommends and patient chooses bed at       Patient to be transferred to   on  .  Patient to be transferred to facility by Ambulance  [facility]     Patient family notified on   of transfer.  Name of family member notified:        PHYSICIAN Please prepare priority discharge summary, including medications,  Please sign FL2, Please prepare prescriptions     Additional Comment:  Awaiting bed offers for Letter of Guarantee bed.  [facility] [place] [facility] He is currently reviewing patient's referral for possible bed offer.  Patient is aware and agreeable to go wherever CSW locates a bed. States "I've been told that I"m leaving tomorrow."  Lupita LeashDonna T. Andria RheinCrowder, LCSW [phone]      _______________________________________________ Lovette Clicherowder, [facility] T, LCSW 12/07/2014, 5:30 pm

## 2014-12-07 NOTE — Clinical Social Work Note (Signed)
Clinical Social Work Assessment  Patient Details  Name: Ryan [name] MRN: [id number] Date of Birth: 01-11-1961  Date of referral:  12/07/14               Reason for consult:  Facility Placement                Permission sought to share information with:  Other, Customer service manager (2 friends- Francee Piccolo and Jordan) Permission granted to share information::  Yes, Verbal Permission Granted    Housing/Transportation Living arrangements for the past 2 months:  Mobile Home Source of Information:  Patient, Other (Comment Required) Denman GeorgeVennie Homans) Patient Interpreter Needed:  None Criminal Activity/Legal Involvement Pertinent to Current Situation/Hospitalization:    Significant Relationships:  Friend, Other(Comment) Printmaker and co-workers) Lives with:  Self Do you feel safe going back to the place where you live?  No Need for family participation in patient care:  No (Coment)  Care giving concerns:  Patient feels that he can return home with help from friends but states "my boss insists that I can't go home right now- that it's not safe. I'm feeling really good!." Patient states he is normally self sufficient of his ADL's.   Social Worker assessment / plan:  CSW met with patient and his friend Vennie Homans (permission to speak with his friend present per patient).  He now has a right BKA and states that his boss feels that he needs to seek rehab for short term care as he lives alone.  He admits that he does not have daily care but states he has had other surgeries and did well.  CSW discussed d/c options- PT recommended CIR but after evaluation- they determined that he was too high level of functioning to need CIR and MD recommended home with [facility].  Patient is agreeable to short term SNF due to the urgings of his boss. CSW discussed the SNF placement process and patient verbalized a desire to go to [facility] .  He also verbalized several other SNF's the he is familiar with.  CSW  explained that due to his lack of insurance- will have to seek a facility that will accept a Letter of Guarantee and this greatly reduces his choices.  Bed search extended beyond [place] to [place], [place],  [place].  Fl2 completed and placed on chart for MD's signature.  Awaiting possible bed offers.  Employment status:  Part-Time, Other (Comment) (Attempting to get disability) Insurance information:  Self Pay (Medicaid Pending) PT Recommendations:  Inpatient Rehab Consult (CIR stated not appropriate) Information / Referral to community resources:  [place]  Patient/Family's Response to care:  Patient states that he is feeling much better and is able to move his amputated leg quite well. He is extremely worried about his current dressing and questioned nursing about the wrap and when it would be changed. She provided education during CSW's visit.  He is anxious to move on to SNF level of care to begin rehab prior to returning home.    Patient/Family's Understanding of and Emotional Response to Diagnosis, Current Treatment, and Prognosis:  Patient has an excellent understanding of his current medical issues, diagnosis, treatment and prognosis.  He spoke frequently about his diabetes and past toe amputations.  Patient was noted to be more concerned about his insurance issues (lack of insurance) and need for disability and Medicaid.  He related that he was visited by a woman today who completed papers for his application for Medicaid and  disability- stating he has done this multiple times in the past without resolution.  He agrees to seek rehab for short term care.  Emotional Assessment Appearance:  Appears older than stated age, Disheveled Attitude/Demeanor/Rapport:   (Pleasant, calm, humorous, involved attitude) Affect (typically observed):  Happy, Pleasant, Hopeful Orientation:  Oriented to Self, Oriented to Place, Oriented to  Time, Oriented to Situation Alcohol  / Substance use:  Tobacco Use (Uses Snuff) Psych involvement (Current and /or in the community):  No (Comment)  Discharge Needs  Concerns to be addressed:  Financial / Insurance Concerns, Home Safety Concerns, Care Coordination (Needs short term SNF) Readmission within the last 30 days:  Yes Current discharge risk:  Lives alone, Dependent with Mobility, Inadequate Financial Supports, Physical Impairment Barriers to Discharge:  Inadequate or no insurance   Estill Bakes 12/07/2014, 4:50 PM [phone]

## 2014-12-07 NOTE — Progress Notes (Signed)
Rehab admissions - Evaluated for possible admission.  Please see rehab consult done today recommending HH therapies.  Likely will not need an inpatient rehab stay.  I will follow for progress.  Call me for questions.  #[phone]

## 2014-12-07 NOTE — Progress Notes (Signed)
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 Patient stated that he lives in a small trailor and is unable to access bedroom and bathroom with use of RW and will have to use crutches. Patient requires min A with use of crutches and max cues for safety and using good judgement. Family friend present that assist patient at home and with managing his medical needs. Friend is hoping to speak with CIR representative if able. RN aware. Continue to recommend comprehensive inpatient rehab (CIR) for post-acute therapy needs as patient has great potential to return to Mod I level and return home safely.   Follow Up Recommendations  CIR     Equipment Recommendations  Rolling walker with 5" wheels    Recommendations for Other Services       Precautions / Restrictions Precautions Precautions: Fall Restrictions LLE Weight Bearing: Non weight bearing Other Position/Activity Restrictions: L BKA    Mobility  Bed Mobility               General bed mobility comments: Patient up in recliner before and after session  Transfers Overall transfer level: Needs assistance Equipment used: Rolling walker (2 wheeled)   Sit to Stand: Min assist         General transfer comment: min A for steadying once up, but able to power up with min/guard  Ambulation/Gait Ambulation/Gait assistance: Min assist Ambulation Distance (Feet): 80 Feet (once with RW once with crutches.) Assistive device: Crutches;Rolling walker (2 wheeled) Gait  Pattern/deviations: Step-to pattern Gait velocity: quick and a little impulsive   General Gait Details: Min A to ensure balance with use of crutches especially with turning.    Stairs            Wheelchair Mobility    Modified Rankin (Stroke Patients Only)       Balance                                    Cognition Arousal/Alertness: Awake/alert Behavior During Therapy: WFL for tasks assessed/performed Overall Cognitive Status: Within Functional Limits for tasks assessed                      Exercises Amputee Exercises Quad Sets: AROM;Left;Strengthening Hip ABduction/ADduction: AROM;Left;Strengthening Knee Flexion: AROM;Left;Strengthening Knee Extension: AROM;Left;Strengthening Straight Leg Raises: AROM;Left;Strengthening    General Comments        Pertinent Vitals/Pain Pain Assessment: No/denies pain    Home Living                      Prior Function            PT Goals (current goals can now be found in the care plan section) Progress towards PT goals: Progressing toward goals    Frequency  Min 5X/week    PT Plan Current plan remains appropriate    Co-evaluation             End of Session Equipment Utilized During  Treatment: Gait belt Activity Tolerance: Patient tolerated treatment well Patient left: in chair;with call bell/phone within reach     Time: [id number] PT Time Calculation (min) (ACUTE ONLY): 32 min  Charges:  $Gait Training: 8-22 mins $Therapeutic Exercise: 8-22 mins                    G Codes:      Fredrich BirksRobinette, Ademola Vert Elizabeth 12/07/2014, 10:21 AM  12/07/2014 Fredrich Birksobinette, Saleah Rishel Elizabeth PTA [phone] pager [phone] office

## 2014-12-07 NOTE — Progress Notes (Signed)
Patient ID: Ryan PellantRonald R Campau, male   DOB: July 14, 1961, 54 y.o.   MRN: [id number] Postoperative day 3 status post left transtibial amputation. Dressing is clean and dry. Patient has full active extension of his knee. Plan for inpatient rehabilitation. Patient does not feel safe for discharge to home at this time.

## 2014-12-07 NOTE — Progress Notes (Signed)
CSW completed Fl2 and will place on chart. SNF as secondary option for patient if unable to go to SNF.  CSW spoke with Ryan GlazierBarbara Boyette, RN Liaison for CIR re: CIR recommendation and order for evaluation. CSW also left message for Marlene Lardhonda Lambert- Financial Counselor for follow up with patient- anticipate need for disability and medicaid.  SW assessment to follow this morning.  Lorri Frederickonna T. Jaci LazierCrowder, Kentucky[place]CSW [phone]

## 2014-12-08 LAB — GLUCOSE, CAPILLARY
GLUCOSE-CAPILLARY: 156 mg/dL — AB (ref 70–99)
GLUCOSE-CAPILLARY: 255 mg/dL — AB (ref 70–99)
Glucose-Capillary: 73 mg/dL (ref 70–99)
Glucose-Capillary: 108 mg/dL — ABNORMAL HIGH (ref 70–99)

## 2014-12-08 MED ORDER — OXYCODONE HCL 5 MG PO TABS: 5.0000 mg | ORAL_TABLET | ORAL | Status: DC | PRN

## 2014-12-08 NOTE — Progress Notes (Signed)
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 Patient does not have assistance at home and was denied CIR admission. At this time patient continues to require therapy to reach functional independence. At this time will update to ongoing therapy at SNF to increase functional independence prior to returning home.   Follow Up Recommendations  SNF     Equipment Recommendations  Rolling walker with 5" wheels    Recommendations for Other Services       Precautions / Restrictions Restrictions Weight Bearing Restrictions: Yes LLE Weight Bearing: Non weight bearing    Mobility  Bed Mobility Overal bed mobility: Modified Independent                Transfers Overall transfer level: Needs assistance Equipment used: Rolling walker (2 wheeled)   Sit to Stand: Min assist         General transfer comment: Min A to ensure balance and stability with standing  Ambulation/Gait Ambulation/Gait assistance: Min guard Ambulation Distance (Feet): 250 Feet Assistive device: Crutches Gait Pattern/deviations: Step-to pattern Gait velocity:  little impulsive   General Gait Details: MG for safety. Cues for focusing as patient easily distracted with conversations and stops for breaks frequently   Stairs            Wheelchair Mobility    Modified Rankin (Stroke Patients Only)       Balance                                    Cognition Arousal/Alertness: Awake/alert Behavior During Therapy: WFL for tasks  assessed/performed Overall Cognitive Status: Within Functional Limits for tasks assessed                      Exercises      General Comments        Pertinent Vitals/Pain Pain Assessment: No/denies pain    Home Living                      Prior Function            PT Goals (current goals can now be found in the care plan section) Progress towards PT goals: Progressing toward goals    Frequency  Min 3X/week    PT Plan Discharge plan needs to be updated    Co-evaluation             End of Session Equipment Utilized During Treatment: Gait belt Activity Tolerance: Patient tolerated treatment well Patient left: in chair;with call bell/phone within reach     Time: 1037-1056 PT Time Calculation (min) (ACUTE ONLY): 19 min  Charges:  $Gait Training: 8-22 mins                    G Codes:      Fredrich BirksRobinette, Kierah Goatley Elizabeth 12/08/2014, 12:21 PM 12/08/2014 Fredrich Birksobinette, Madasyn Heath Elizabeth PTA [phone] pager [phone] office

## 2014-12-08 NOTE — Care Management Note (Signed)
Case Management Note  Patient Details  Name: Christiana PellantRonald R Alicia MRN: [id number] Date of Birth: 06/06/1961  Subjective/Objective:     54 yr old male s/p left BKA             Action/Plan:    [facility] Has no support at home, lives in mobile home.      Expected Discharge Date:  12/08/14                Expected Discharge Plan:  Skilled Nursing Facility  In-House Referral:  Clinical Social Work  Discharge planning Services  CM Consult  Post Acute Care Choice:  Home Health, Durable Medical Equipment Choice offered to:  NA, Patient  DME Arranged:  Walker rolling DME Agency: HH Arranged:  RN HH Agency:  [facility]  Status of Service:  Completed, signed off  Medicare Important Message Given:    Date Medicare IM Given:    Medicare IM give by:    Date Additional Medicare IM Given:    Additional Medicare Important Message give by:     If discussed at Long Length of Stay Meetings, dates discussed:    Additional Comments:         Durenda GuthrieBrady, Radiance Deady Naomi, RN       12/08/2014, 11:22 AM

## 2014-12-08 NOTE — Discharge Summary (Signed)
Physician Discharge Summary  Patient ID: Ryan [name] MRN: [id number] DOB/AGE: [birth year] 52 y.o.  Admit date: 12/04/2014 Discharge date: 12/08/2014  Admission Diagnoses: Left lower extremity abscess ulceration and osteomyelitis  Discharge Diagnoses:  Active Problems:   S/P BKA (below knee amputation) unilateral   Discharged Condition: stable  Hospital Course: Patient's hospital course was essentially unremarkable. Patient underwent transtibial amputation. Postoperatively patient progressed slowly and was discharged to skilled nursing in stable condition.  Consults: None  Significant Diagnostic Studies: labs: Routine labs  Treatments: surgery: See operative note  Discharge Exam: Blood pressure 158/80, pulse 89, temperature 97.7 F (36.5 C), temperature source Oral, resp. rate 18, height 5\' 11"  (1.803 m), weight 74.39 kg (164 lb), SpO2 100 %. Incision/Wound: dressing clean and dry  Disposition: 01-Home or Self Care  Discharge Instructions    Nursing communication    Complete by:  As directed   Need Anesthesia to consult on meds per MD.            Medication List    TAKE these medications        oxyCODONE 5 MG immediate release tablet  Commonly known as:  Oxy IR/ROXICODONE  Take 1-3 tablets (5-15 mg total) by mouth every 4 (four) hours as needed.      ASK your doctor about these medications        cephALEXin 500 MG capsule  Commonly known as:  KEFLEX  Take 500 mg by mouth 3 (three) times daily. 7 day course started 12/03/14 pm     ibuprofen 200 MG tablet  Commonly known as:  ADVIL,MOTRIN  Take 400 mg by mouth every 6 (six) hours as needed for fever (pain).     insulin detemir 100 UNIT/ML injection  Commonly known as:  LEVEMIR  Inject 0.15 mLs (15 Units total) into the skin at bedtime.     lisinopril 20 MG tablet  Commonly known as:  PRINIVIL,ZESTRIL  Take 1 tablet (20 mg total) by mouth daily.     oxyCODONE-acetaminophen 5-325 MG per tablet  Commonly  known as:  PERCOCET/ROXICET  Take 1-2 tablets by mouth every 4 (four) hours as needed for severe pain.     PROBIOTIC PO  Take 1 tablet by mouth daily after lunch.           Follow-up Information    Follow up with Damarkus Balis V, MD In 2 weeks.   Specialty:  Orthopedic Surgery   Contact information:   [address] [place] [address] [phone]       Signed: Nadara MustardDUDA,Raylee Strehl V 12/08/2014, 6:20 AM

## 2014-12-09 LAB — GLUCOSE, CAPILLARY
GLUCOSE-CAPILLARY: 96 mg/dL (ref 70–99)
Glucose-Capillary: 173 mg/dL — ABNORMAL HIGH (ref 70–99)

## 2014-12-09 NOTE — Discharge Planning (Addendum)
Patient will discharge today per MD order. [facility]  RN to call report prior to transportation to: [phone] Transportation: [facility]   CSW sent discharge summary to SNF for review.  Packet is complete.  RN and patient aware of discharge plans.  Letter of Guarantee and facility approved by MD Jacky KindleAronson.  Transportation approved by Chief Executive OfficerCSW Director.  Vickii PennaGina Danasha Melman, LCSWA [phone]  Psychiatric & Orthopedics (5N 1-16) Clinical Social Worker

## 2014-12-09 NOTE — Progress Notes (Signed)
Patient ID: Christiana PellantRonald R Lesage, male   DOB: [birth year], 54 y.o.   MRN: [id number] Discharge was delayed yesterday. Plan for discharge to skilled nursing today. No change in patients medical status. No changes in discharge summary.

## 2014-12-09 NOTE — Progress Notes (Signed)
Patient d/c'd to facility via wheelchair in stable condition.  Report called to Darl PikesSusan, RN at [phone].  [place] to [place] with medical transport.  All questions answered.  Patient has all of his belongings, including his crutches.

## 2014-12-09 NOTE — Clinical Social Work Placement (Signed)
   CLINICAL SOCIAL WORK PLACEMENT  NOTE  Date:  12/09/2014  Patient Details  Name: Ryan [name] MRN: [id number] Date of Birth: 1960-10-18  Clinical Social Work is seeking post-discharge placement for this patient at the Skilled  Nursing Facility level of care (*CSW will initial, date and re-position this form in  chart as items are completed):  No   Patient/family provided with [facility] Clinical Social Work Department's list of facilities offering this level of care within the geographic area requested by the patient (or if unable, by the patient's family).  No   Patient/family informed of their freedom to choose among providers that offer the needed level of care, that participate in Medicare, Medicaid or managed care program needed by the patient, have an available bed and are willing to accept the patient.  No   Patient/family informed of [facility]'s ownership interest in [facility] and [facility], as well as of the fact that they are under no obligation to receive care at these facilities.  PASRR submitted to EDS on 12/07/14     PASRR number received on 12/07/14     Existing PASRR number confirmed on       FL2 transmitted to all facilities in geographic area requested by pt/family on       FL2 transmitted to all facilities within larger geographic area on 12/07/14     Patient informed that his/her managed care company has contracts with or will negotiate with certain facilities, including the following:   (NA. No insurance.)     Yes   Patient/family informed of bed offers received.  Patient chooses bed [facility]     Physician recommends and patient chooses bed at  (none)    Patient to be transferred to [facility] on 12/09/14.  Patient to be transferred to facility by       Patient family notified on 12/09/14 of transfer.  Name of family member notified:  Patient is alert and oriented x4 and is able to make his own  decisions. Patient states he will inform famiily of his STR facility placement.     PHYSICIAN Please prepare priority discharge summary, including medications, Please sign FL2, Please prepare prescriptions     Additional Comment:    _______________________________________________ Rondel BatonIngle, Evaan Tidwell C, LCSW 12/09/2014, 11:33 AM

## 2014-12-10 NOTE — Clinical Social Work Note (Signed)
Late note:  CSW reviewed disposition plans with patient.  Patient is agreeable.  Later when [facility] arrived to transport, patient exhibited moderate anxiety about STR.  CSW met with patient to re-review disposition plans and answer questions.  Patient was given the option of going home with home health (against medical advise) or going to STR at [facility].  Patient is agreeable to STR and is realistic regarding it not being safe for patient to return home without supervision.  Patient concerned about transportation home says he doesn't want to "hitch hike".  CSW provided education surrounding this process and provided support to patient during this difficult time.  Patient had a friend to come to the unit of which the charge RN received verbal permission for disclosure.  CSW reviewed disposition and friend will assist in clarifying questions with patient.    Nonnie Done, LCSW [phone]  Psychiatric & Orthopedics (5N 1-8) Clinical Social Worker

## 2014-12-15 ENCOUNTER — Ambulatory Visit: Payer: No Typology Code available for payment source | Admitting: Internal Medicine

## 2015-01-25 ENCOUNTER — Other Ambulatory Visit: Payer: Self-pay

## 2015-02-04 ENCOUNTER — Ambulatory Visit: Payer: No Typology Code available for payment source | Attending: Internal Medicine

## 2015-02-12 ENCOUNTER — Other Ambulatory Visit: Payer: Self-pay | Admitting: Internal Medicine

## 2015-02-12 DIAGNOSIS — E119 Type 2 diabetes mellitus without complications: Secondary | ICD-10-CM

## 2015-02-12 MED ORDER — INSULIN DETEMIR 100 UNIT/ML ~~LOC~~ SOLN: 15.0000 [IU] | Freq: Every day | SUBCUTANEOUS | Status: DC

## 2015-02-25 ENCOUNTER — Encounter: Payer: Self-pay | Admitting: Internal Medicine

## 2015-02-25 ENCOUNTER — Ambulatory Visit: Payer: Medicaid Other | Attending: Internal Medicine | Admitting: Internal Medicine

## 2015-02-25 VITALS — BP 138/77 | HR 70 | Temp 98.0°F | Resp 16 | Wt 166.4 lb

## 2015-02-25 DIAGNOSIS — I739 Peripheral vascular disease, unspecified: Secondary | ICD-10-CM | POA: Diagnosis not present

## 2015-02-25 DIAGNOSIS — I10 Essential (primary) hypertension: Secondary | ICD-10-CM | POA: Insufficient documentation

## 2015-02-25 DIAGNOSIS — Z794 Long term (current) use of insulin: Secondary | ICD-10-CM | POA: Diagnosis not present

## 2015-02-25 DIAGNOSIS — M868X7 Other osteomyelitis, ankle and foot: Secondary | ICD-10-CM | POA: Diagnosis not present

## 2015-02-25 DIAGNOSIS — E119 Type 2 diabetes mellitus without complications: Secondary | ICD-10-CM

## 2015-02-25 DIAGNOSIS — E1140 Type 2 diabetes mellitus with diabetic neuropathy, unspecified: Secondary | ICD-10-CM | POA: Diagnosis not present

## 2015-02-25 DIAGNOSIS — Z79899 Other long term (current) drug therapy: Secondary | ICD-10-CM | POA: Diagnosis not present

## 2015-02-25 LAB — GLUCOSE, POCT (MANUAL RESULT ENTRY): POC GLUCOSE: 89 mg/dL (ref 70–99)

## 2015-02-25 LAB — POCT GLYCOSYLATED HEMOGLOBIN (HGB A1C): HEMOGLOBIN A1C: 6.8

## 2015-02-25 NOTE — Progress Notes (Signed)
Patient ID: Ryan [name], male   DOB: Feb 23, 1961, 54 y.o.   MRN: [id number]   Wasil Wolke, is a 54 y.o. male  HQI:[id number]  XLK:[id number]  DOB - January 31, 1961  Chief Complaint  Patient presents with  . Follow-up        Subjective:   Ryan [name] is a 54 y.o. male here today for a follow up visit.  Patient has extensive medical history including essential hypertension, recurrent osteomyelitis after left foot s/p recent below knee amputation of the left leg currently in rehabilitation. Patient is here today for follow-up of operation. He has no complaint today. The wound has healed completely and he has appointment with orthopedic surgery coming up.  The patient claimed blood sugar is better controlled as well as blood pressure.  Patient does not need any medication refills today. Patient has No headache, No chest pain, No abdominal pain - No Nausea, No new weakness tingling or numbness, No Cough - SOB.  No problems updated.  ALLERGIES: Allergies  Allergen Reactions  . Metformin And Related Other (See Comments)    Pt states that metformin made sugars fall into 30's    PAST MEDICAL HISTORY: Past Medical History  Diagnosis Date  . Hypertension   . Osteomyelitis of foot   . Heart murmur     was told "not to worry about it"  . Diabetes mellitus without complication     type 2  . Diabetic neuropathy   . Arthritis   . Anemia     low iron  . Peripheral vascular disease     "poor circulation" in feet and necrosis    MEDICATIONS AT HOME: Prior to Admission medications   Medication Sig Start Date End Date Taking? Authorizing Provider  cephALEXin (KEFLEX) 500 MG capsule Take 500 mg by mouth 3 (three) times daily. 7 day course started 12/03/14 pm    Historical Provider, MD  ibuprofen (ADVIL,MOTRIN) 200 MG tablet Take 400 mg by mouth every 6 (six) hours as needed for fever (pain).    Historical Provider, MD  insulin detemir (LEVEMIR) 100 UNIT/ML injection Inject 0.15 mLs (15  Units total) into the skin at bedtime. 02/12/15   Quentin Angst, MD  lisinopril (PRINIVIL,ZESTRIL) 20 MG tablet Take 1 tablet (20 mg total) by mouth daily. 11/24/14   Quentin Angst, MD  oxyCODONE (OXY IR/ROXICODONE) 5 MG immediate release tablet Take 1-3 tablets (5-15 mg total) by mouth every 4 (four) hours as needed. 12/05/14   Naiping Donnelly Stager, MD  oxyCODONE (OXY IR/ROXICODONE) 5 MG immediate release tablet Take 1-2 tablets (5-10 mg total) by mouth every 3 (three) hours as needed for moderate pain or breakthrough pain. 12/08/14   Nadara Mustard, MD  oxyCODONE-acetaminophen (PERCOCET/ROXICET) 5-325 MG per tablet Take 1-2 tablets by mouth every 4 (four) hours as needed for severe pain.    Historical Provider, MD  Probiotic Product (PROBIOTIC PO) Take 1 tablet by mouth daily after lunch.    Historical Provider, MD     Objective:   Filed Vitals:   02/25/15 1637  BP: 138/77  Pulse: 70  Temp: 98 F (36.7 C)  Resp: 16  Weight: 166 lb 6.4 oz (75.479 kg)  SpO2: 100%    Exam General appearance : Awake, alert, not in any distress. Speech Clear. Not toxic looking HEENT: Atraumatic and Normocephalic, pupils equally reactive to light and accomodation Neck: supple, no JVD. No cervical lymphadenopathy.  Chest:Good air entry bilaterally, no added sounds  CVS: S1 S2 regular,  no murmurs.  Abdomen: Bowel sounds present, Non tender and not distended with no gaurding, rigidity or rebound. Extremities: Left BKA Neurology: Awake alert, and oriented X 3, CN II-XII intact, Non focal Skin:No Rash  Data Review Lab Results  Component Value Date   HGBA1C 6.80 02/25/2015   HGBA1C 8.10 11/02/2014   HGBA1C 9.6* 08/28/2014     Assessment & Plan   1. Type 2 diabetes mellitus without complication  - Glucose (CBG) - HgB A1c is down to 6.8% today. Barring any significant blood loss from surgery, this is a great improvement in glycemic control  Aim for 30 minutes of exercise most days. Rethink what  you drink. Water is great! Aim for 2-3 Carb Choices per meal (30-45 grams) +/- 1 either way  Aim for 0-15 Carbs per snack if hungry  Include protein in moderation with your meals and snacks  Consider reading food labels for Total Carbohydrate and Fat Grams of foods  Consider checking BG at alternate times per day  Continue taking medication as directed Be mindful about how much sugar you are adding to beverages and other foods. Try to decrease. Consider splenda. Fruit Punch - find one with no sugar  Measure and decrease portions of carbohydrate foods  Make your plate and don't go back for seconds   2. Essential hypertension  - CBC with Differential/Platelet - Lipid panel - Basic Metabolic Panel  - We have discussed target BP range and blood pressure goal - I have advised patient to check BP regularly and to call us back or report to clinic if the numbers are consistently higher than 140/90  - We discussed the importance of compliance with medical therapy and DASH diet recommended, consequences of uncontrolled hypertension discussed.  - continue current BP medications  Patient have been counseled extensively about nutrition and exercise Return in about 3 months (around 05/28/2015) for Hemoglobin A1C and Follow up, DM, Follow up Pain and comorbidities.  The patient was given clear instructions to go to ER or return to medical center if symptoms don't improve, worsen or new problems develop. The patient verbalized understanding. The patient was told to call to get lab results if they haven't heard anything in the next week.   This note has been created with Education officer, environmental. Any transcriptional errors are unintentional.    Jeanann Lewandowsky, MD, MHA, CPE, FACP, FAAP [facility] [place], [place] [phone]   02/25/2015, 5:20 PM

## 2015-02-25 NOTE — Progress Notes (Signed)
Patient is here for follow up on his diabetes Patient recently had a below the knee amputation to his left foot And had been in rehab as well

## 2015-02-25 NOTE — Patient Instructions (Signed)

## 2015-02-26 LAB — CBC WITH DIFFERENTIAL/PLATELET
BASOS PCT: 1 % (ref 0–1)
Basophils Absolute: 0.1 10*3/uL (ref 0.0–0.1)
EOS PCT: 4 % (ref 0–5)
Eosinophils Absolute: 0.3 10*3/uL (ref 0.0–0.7)
HEMATOCRIT: 39.8 % (ref 39.0–52.0)
HEMOGLOBIN: 13.4 g/dL (ref 13.0–17.0)
LYMPHS ABS: 2.3 10*3/uL (ref 0.7–4.0)
LYMPHS PCT: 27 % (ref 12–46)
MCH: 30 pg (ref 26.0–34.0)
MCHC: 33.7 g/dL (ref 30.0–36.0)
MCV: 89 fL (ref 78.0–100.0)
MONO ABS: 0.6 10*3/uL (ref 0.1–1.0)
MPV: 10.4 fL (ref 8.6–12.4)
Monocytes Relative: 7 % (ref 3–12)
Neutro Abs: 5.1 10*3/uL (ref 1.7–7.7)
Neutrophils Relative %: 61 % (ref 43–77)
PLATELETS: 218 10*3/uL (ref 150–400)
RBC: 4.47 MIL/uL (ref 4.22–5.81)
RDW: 18.3 % — AB (ref 11.5–15.5)
WBC: 8.4 10*3/uL (ref 4.0–10.5)

## 2015-02-26 LAB — LIPID PANEL
CHOLESTEROL: 222 mg/dL — AB (ref 125–200)
HDL: 40 mg/dL (ref >=40)
LDL Cholesterol: 155 mg/dL — ABNORMAL HIGH (ref <130)
Total CHOL/HDL Ratio: 5.6 Ratio — ABNORMAL HIGH (ref <=5.0)
Triglycerides: 133 mg/dL (ref <150)
VLDL: 27 mg/dL (ref <30)

## 2015-02-26 LAB — BASIC METABOLIC PANEL
BUN: 17 mg/dL (ref 7–25)
CALCIUM: 9.8 mg/dL (ref 8.6–10.3)
CO2: 26 meq/L (ref 20–31)
Chloride: 102 mEq/L (ref 98–110)
Creat: 0.85 mg/dL (ref 0.70–1.33)
GLUCOSE: 86 mg/dL (ref 65–99)
Potassium: 4.4 mEq/L (ref 3.5–5.3)
SODIUM: 140 meq/L (ref 135–146)

## 2015-03-03 ENCOUNTER — Other Ambulatory Visit: Payer: Self-pay | Admitting: Internal Medicine

## 2015-03-03 DIAGNOSIS — E785 Hyperlipidemia, unspecified: Secondary | ICD-10-CM

## 2015-03-03 MED ORDER — ATORVASTATIN CALCIUM 40 MG PO TABS: 40.0000 mg | ORAL_TABLET | Freq: Every day | ORAL | Status: DC

## 2015-04-02 ENCOUNTER — Telehealth: Payer: Self-pay

## 2015-04-02 NOTE — Telephone Encounter (Signed)
Nurse called patient, reached voicemail. Left message for patient to call Celie Desrochers with [facility], at [phone].  

## 2015-04-02 NOTE — Telephone Encounter (Signed)
-----   Message from Tandy Gaw, RN sent at 03/24/2015  9:35 AM EDT -----   ----- Message -----    From: Tandy Gaw, RN    Sent: 03/12/2015   3:01 PM      To: Dorathy Daft, RN    ----- Message -----    From: Quentin Angst, MD    Sent: 03/03/2015  12:08 PM      To: Tandy Gaw, RN  Please inform patient that his cholesterol level is high, other laboratory results are normal. White cell count is now normal. We will start patient on cholesterol medication and also to address this please limit saturated fat to no more than 7% of your calories, limit cholesterol to 200 mg/day, increase fiber and exercise as tolerated. Medication has been prescribed to the pharmacy.

## 2015-04-02 NOTE — Telephone Encounter (Signed)
Patient called returning nurse's call to review results  °

## 2015-04-07 NOTE — Telephone Encounter (Signed)
Nurse called patient, reached voicemail. Left message for patient to call Lennart Gladish with [facility], at [phone].  

## 2015-04-07 NOTE — Telephone Encounter (Signed)
-----   Message from Joshlyn Beadle A Mashelle Busick, RN sent at 03/24/2015  9:35 AM EDT -----   ----- Message -----    From: Tome Wilson A Edmund Rick, RN    Sent: 03/12/2015   3:01 PM      To: Anna Q Smythe, RN    ----- Message -----    From: Olugbemiga E Jegede, MD    Sent: 03/03/2015  12:08 PM      To: Jaquaya Coyle A Kandie Keiper, RN  Please inform patient that his cholesterol level is high, other laboratory results are normal. White cell count is now normal. We will start patient on cholesterol medication and also to address this please limit saturated fat to no more than 7% of your calories, limit cholesterol to 200 mg/day, increase fiber and exercise as tolerated. Medication has been prescribed to the pharmacy. 

## 2015-04-08 NOTE — Telephone Encounter (Signed)
-----   Message from Ronne Savoia A Druanne Bosques, RN sent at 03/24/2015  9:35 AM EDT -----   ----- Message -----    From: Diamantina Edinger A Kemontae Dunklee, RN    Sent: 03/12/2015   3:01 PM      To: Anna Q Smythe, RN    ----- Message -----    From: Olugbemiga E Jegede, MD    Sent: 03/03/2015  12:08 PM      To: Yusra Ravert A Candus Braud, RN  Please inform patient that his cholesterol level is high, other laboratory results are normal. White cell count is now normal. We will start patient on cholesterol medication and also to address this please limit saturated fat to no more than 7% of your calories, limit cholesterol to 200 mg/day, increase fiber and exercise as tolerated. Medication has been prescribed to the pharmacy. 

## 2015-04-08 NOTE — Telephone Encounter (Signed)
Nurse called patient, patient verified date of birth. Patient aware of high cholesterol, other labs are normal. Patient aware of normal white blood cell count.  Patient agrees to limit saturated fat to no more than 7% of your calories, limit cholesterol to /day, increase fiber and patient is exercising as tolerated.  Patient reports muscles in arms, legs, chest, shoulders hurt when on cholesterol medication, atorvastatin.  Patient states he has tried medication twice and both times he stopped medication, pain went away.  Patient reports blood sugars in the mornings are in the 70s, patient has decreased amount of insulin.  Patient reports BP is 80s/60s. Patient cut BP medication in half. Nurse advised patient to be seen by nurse for CBG and BP check. Patient refused. Patient explains he is keeping a journal of values and agrees to call [facility] if BP and CBG drops lower.  Patient is requesting prescription for diabetic shoes.

## 2015-04-12 ENCOUNTER — Telehealth: Payer: Self-pay | Admitting: General Practice

## 2015-04-12 NOTE — Telephone Encounter (Signed)
Patient presents to clinic to speak to a nurse . Patient is requesting a prescription for diabetic shoes. Patient is expressing extreme urgency. Please assist.

## 2015-04-20 ENCOUNTER — Other Ambulatory Visit: Payer: Self-pay | Admitting: *Deleted

## 2015-05-06 ENCOUNTER — Other Ambulatory Visit: Payer: Self-pay | Admitting: *Deleted

## 2015-05-06 MED ORDER — GLUCOSE BLOOD VI STRP: ORAL_STRIP | Status: DC

## 2015-05-06 MED ORDER — ACCU-CHEK SOFTCLIX LANCET DEV MISC: Status: DC

## 2015-05-06 MED ORDER — ACCU-CHEK AVIVA PLUS W/DEVICE KIT: 1.0000 | PACK | Freq: Two times a day (BID) | Status: DC

## 2015-05-10 ENCOUNTER — Encounter: Payer: Self-pay | Admitting: Physical Therapy

## 2015-05-10 ENCOUNTER — Ambulatory Visit: Payer: Medicaid Other | Attending: Orthopedic Surgery | Admitting: Physical Therapy

## 2015-05-10 DIAGNOSIS — Z5189 Encounter for other specified aftercare: Secondary | ICD-10-CM | POA: Diagnosis present

## 2015-05-10 DIAGNOSIS — R2681 Unsteadiness on feet: Secondary | ICD-10-CM | POA: Diagnosis present

## 2015-05-10 DIAGNOSIS — R2689 Other abnormalities of gait and mobility: Secondary | ICD-10-CM

## 2015-05-10 DIAGNOSIS — R29818 Other symptoms and signs involving the nervous system: Secondary | ICD-10-CM | POA: Diagnosis present

## 2015-05-10 DIAGNOSIS — Z89512 Acquired absence of left leg below knee: Secondary | ICD-10-CM | POA: Diagnosis present

## 2015-05-10 DIAGNOSIS — R6889 Other general symptoms and signs: Secondary | ICD-10-CM | POA: Diagnosis present

## 2015-05-10 DIAGNOSIS — Z4789 Encounter for other orthopedic aftercare: Secondary | ICD-10-CM

## 2015-05-10 DIAGNOSIS — R269 Unspecified abnormalities of gait and mobility: Secondary | ICD-10-CM | POA: Diagnosis not present

## 2015-05-10 NOTE — Therapy (Signed)
[facility] [address] [place], [place], [address] Phone: [phone]   Fax:  [phone]  Physical Therapy Evaluation  Patient Details  Name: Ryan [name] MRN: [id number] Date of Birth: [birth year] Referring Provider:  Nadara Mustard, MD  Encounter Date: 05/10/2015      PT End of Session - 05/10/15 1015    Visit Number 1   Number of Visits 9   Authorization Type Medicaid   PT Start Time 1017   PT Stop Time 1100   PT Time Calculation (min) 43 min   Equipment Utilized During Treatment Gait belt   Activity Tolerance Patient limited by pain   Behavior During Therapy [facility] for tasks assessed/performed      Past Medical History  Diagnosis Date  . Hypertension   . Osteomyelitis of foot (HCC)   . Heart murmur     was told "not to worry about it"  . Diabetes mellitus without complication (HCC)     type 2  . Diabetic neuropathy (HCC)   . Arthritis   . Anemia     low iron  . Peripheral vascular disease (HCC)     "poor circulation" in feet and necrosis    Past Surgical History  Procedure Laterality Date  . Amputation Right 01/13/2014    Procedure: Right great toe amputation;  Surgeon: Jacki Cones, MD;  Location: [facility] ORS;  Service: Orthopedics;  Laterality: Right;  . Knee surgery Right   . Arm surgery Left     due to broken arm  . Amputation Left 06/08/2014    Procedure: AMPUTATION LEFT GREAT TOE;  Surgeon: Verlee Rossetti, MD;  Location: [facility] OR[facility];  Service: Orthopedics;  Laterality: Left;  . Amputation Left 07/01/2014    Procedure: LEFT First Metatarsal RAY AMPUTATION ;  Surgeon: Verlee Rossetti, MD;  Location: [facility] OR;  Service: Orthopedics;  Laterality: Left;  . Amputation Left 08/28/2014    Procedure: Left Foot Fifth ray resection;  Surgeon: Kathryne Hitch, MD;  Location: [facility];  Service: Orthopedics;  Laterality: Left;  . Amputation Left 10/08/2014    Procedure: LEFT FOOT TRANSMETATARSAL  AMPUTATION;  Surgeon: Kathryne Hitch, MD;  Location: [facility] OR;  Service: Orthopedics;  Laterality: Left;  . Amputation Left 12/04/2014    Procedure: AMPUTATION BELOW KNEE;  Surgeon: Nadara Mustard, MD;  Location: [facility] OR;  Service: Orthopedics;  Laterality: Left;    There were no vitals filed for this visit.  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 He recieved his first prosthesis on 04/26/2015 and is dependent in use and care. He presents to PT for evaluation and training with his new prosthesis.   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 No/denies            [facility] PT Assessment - 05/10/15 1015    Assessment   Medical Diagnosis Left Transtibial Amputation   Onset Date/Surgical Date 04/26/15   Precautions   Precautions Fall   Restrictions   Weight Bearing Restrictions No   Balance Screen   Has the patient fallen in the past 6  months Yes   How many times? 1  on rehab placing left leg to catch self   Has the patient had a decrease in activity level because of a fear of falling?  No   Is the patient reluctant to leave their home because of a fear of falling?  No   Home Environment   Living Environment Private residence   Living Arrangements Alone   Type of Home Mobile home   Home Access Stairs to enter  grass walk from car   Entrance Stairs-Number of Steps 5   Entrance Stairs-Rails Left   Home Layout One level   Home Equipment Crutches;Cane - single point   Prior Function   Level of Independence Independent;Independent with gait   Vocation Part time employment  ~20 hrs/wk    Vocation Requirements mopping, vacuum, climb ladders, kneel, lift/carry up to 30# regularly but 80# occasional   Observation/Other Assessments   Focus on Therapeutic Outcomes (FOTO)  36.91 Functional Status   Fear Avoidance Belief Questionnaire (FABQ)  26   Posture/Postural Control   Posture/Postural Control Postural limitations   Postural Limitations Forward head;Weight shift right   ROM / Strength   AROM / PROM / Strength Strength   Strength   Overall Strength Within functional limits for tasks performed   Ambulation/Gait   Ambulation/Gait Yes   Ambulation/Gait Assistance 5: Supervision   Ambulation Distance (Feet) 250 Feet   Assistive device Prosthesis;Straight cane   Gait Pattern Step-through pattern;Decreased step length - right;Decreased stance time - left;Decreased stride length;Decreased hip/knee flexion - left;Decreased weight shift to left;Left flexed knee in stance;Antalgic;Abducted - left;Poor foot clearance - left   Ambulation Surface Indoor;Level   Gait velocity 2.78 ft/sec cane, 2.95 ft/sec prosthesis only   Stairs Yes   Stairs Assistance 5: Supervision   Stair Management Technique One rail Left;With cane;Forwards;Step to pattern   Number of Stairs 4   Standardized Balance Assessment   Standardized Balance Assessment Berg Balance Test   Berg Balance Test   Sit to Stand Able to stand  independently using hands   Standing Unsupported Able to stand safely 2 minutes   Sitting with Back Unsupported but Feet Supported on Floor or Stool Able to sit safely and securely 2 minutes   Stand to Sit Controls descent by using hands   Transfers Able to transfer safely, minor use of hands   Standing Unsupported with Eyes Closed Able to stand 10 seconds with supervision   Standing Ubsupported with Feet Together Able to place feet together independently and stand for 1 minute with supervision   From Standing, Reach Forward with Outstretched Arm Can reach confidently >25 cm (10")   From  Standing Position, Pick up Object from Floor Able to pick up shoe, needs supervision   From Standing Position, Turn to Look Behind Over each Shoulder Looks behind from both sides and weight shifts well   Turn 360 Degrees Needs close supervision or verbal cueing   Standing Unsupported, Alternately Place Feet on Step/Stool Able to complete 4 steps without aid or supervision   Standing Unsupported, One Foot in Front Able to take small step independently and hold 30 seconds   Standing on One Leg Able to lift leg independently and hold equal to or more than 3 seconds   Total Score 42   Functional Gait  Assessment   Gait assessed  Yes   Gait Level Surface Walks 20 ft, slow speed, abnormal gait pattern, evidence for imbalance or deviates 10-15 in outside of  the 12 in walkway width. Requires more than 7 sec to ambulate 20 ft.   Change in Gait Speed Makes only minor adjustments to walking speed, or accomplishes a change in speed with significant gait deviations, deviates 10-15 in outside the 12 in walkway width, or changes speed but loses balance but is able to recover and continue walking.   Gait with Horizontal Head Turns Performs head turns with moderate changes in gait velocity, slows down, deviates 10-15 in outside 12 in walkway width but recovers, can continue to walk.   Gait with Vertical Head Turns Performs task with slight change in gait velocity (eg, minor disruption to smooth gait path), deviates 6 - 10 in outside 12 in walkway width or uses assistive device   Gait and Pivot Turn Turns slowly, requires verbal cueing, or requires several small steps to catch balance following turn and stop   Step Over Obstacle Is able to step over one shoe box (4.5 in total height) but must slow down and adjust steps to clear box safely. May require verbal cueing.   Gait with Narrow Base of Support Ambulates less than 4 steps heel to toe or cannot perform without assistance.   Gait with Eyes Closed Walks 20 ft, slow  speed, abnormal gait pattern, evidence for imbalance, deviates 10-15 in outside 12 in walkway width. Requires more than 9 sec to ambulate 20 ft.   Ambulating Backwards Walks 20 ft, slow speed, abnormal gait pattern, evidence for imbalance, deviates 10-15 in outside 12 in walkway width.   Steps Two feet to a stair, must use rail.   Total Score 10         Prosthetics Assessment - 05/10/15 1015    Prosthetics   Prosthetic Care Dependent with Residual limb care;Skin check;Prosthetic cleaning;Ply sock cleaning;Correct ply sock adjustment;Proper wear schedule/adjustment;Proper weight-bearing schedule/adjustment   Donning prosthesis  Supervision   Doffing prosthesis  Modified independent (Device/Increase time)   Current prosthetic wear tolerance (days/week)  reports wear 14 of 14 days since delivery   Current prosthetic wear tolerance (#hours/day)  reports wear >10hrs /day   Current prosthetic weight-bearing tolerance (hours/day)  Patient tolerated standing with partial weight on prosthesis for 10 minutes with residaul limb pain 2/10   Edema no pitting edema   Residual limb condition  redness over patella & distal tibia. Multiple red hair follicles from improper sweat managment.   K code/activity level with prosthetic use  3                  [facility] Adult PT Treatment/Exercise - 05/10/15 1015    Prosthetics   Education Provided Residual limb care;Prosthetic cleaning;Correct ply sock adjustment;Proper Donning;Proper wear schedule/adjustment  use of anti-perspirant, drying q4 hrs.   Person(s) Educated Patient   Education Method Explanation;Demonstration;Verbal cues;Tactile cues   Education Method Verbalized understanding;Verbal cues required;Needs further instruction   Donning Prosthesis Supervision                     PT Long Term Goals - 05/10/15 1015    PT LONG TERM GOAL #1   Title Patient verbalizes proper prosthetic care including donning, adjusting ply socks,  residual limb management & skin integrity to enable safe use of prosthesis. (Target Date: 8th treatment)    Baseline Patient is dependent in all aspects of prosthetic care. He has redness on residual limb from improper wear / use for 14 days since delivery.   Status New   PT LONG TERM GOAL #2  Title Patient tolerates prosthesis wear >90% of awake hours with no skin issues or tenderness. (Target Date: 8th treatment)    Baseline Patient wears prosthesis >10hours daily but has redness on limb due to improper advancement of wear.   Status New   PT LONG TERM GOAL #3   Title Patient ambulates >1000' including grass, ramps & curbs with prosthesis only independently. (Target Date: 8th treatment)    Baseline Patient ambulates 250' with prosthesis and cane with supervision.    Status New   PT LONG TERM GOAL #4   Title Berg Balance test >/= 52/56 to indicate lower fall risk   (Target Date: 8th treatment)    Baseline Berg Balance 42/56   Status New   PT LONG TERM GOAL #5   Title Functional Gait Assessment >/=19/30 to indicate lower fall risk with gait. (Target Date: 8th treatment)    Baseline Functional Gait Assessment 10/30   Status New   Additional Long Term Goals   Additional Long Term Goals Yes   PT LONG TERM GOAL #6   Title Patient performs work related tasks including pushing / pulling like mopping, vacuum, lifitng & carrying up to 30#, climbing ladders with prosthesis independently. 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 He recieved his first prosthesis on 04/26/2015 and is dependent in use and care. He requires skilled instruction to progress wear without skin issues  or limb pain. His Berg Balance score of 42/56 indicates high risk of falls. His Functional Gait Assessment of 10/30 indicates high risk of falls with gait activities. He is dependent in work related tasks including lifting/carrying, climbing ladders and push/pull resistive activities like vacuuming. Patient would benefit from skilled instructions to safely return to prior activities.                       Pt will benefit from skilled therapeutic intervention in order to improve on the following deficits Abnormal gait;Decreased activity tolerance;Decreased balance;Decreased endurance;Decreased mobility;Decreased skin integrity;Prosthetic Dependency   Rehab Potential Good   PT Frequency 2x / week   PT Duration 4 weeks   PT Treatment/Interventions ADLs/Self Care Home Management;DME Instruction;Gait training;Stair training;Functional mobility training;Therapeutic activities;Therapeutic exercise;Balance training;Neuromuscular re-education;Patient/family education;Prosthetic Training   PT Next Visit Plan Review prosthetic care, prosthetic gait including barriers   Consulted and Agree with Plan of Care Patient         Problem List Patient Active Problem List   Diagnosis Date Noted  . S/P BKA (below knee amputation) unilateral (HCC) 12/04/2014  . S/P transmetatarsal amputation of foot (HCC) 10/08/2014  . Gangrene of foot (HCC) 08/27/2014  . Foot osteomyelitis, left (HCC) 08/27/2014  . Toe amputation status (HCC) 07/01/2014  . Essential hypertension 06/23/2014  . Osteomyelitis of left foot (HCC) 06/08/2014  . Osteomyelitis of toe (HCC) 06/08/2014  . Diabetes mellitus type 2 with complications (HCC)   . Diabetic foot (HCC) 05/07/2014  . Osteomyelitis of right foot (HCC) 01/13/2014  . Leukocytosis 01/12/2014  . Gangrene associated with diabetes mellitus (HCC) 01/11/2014  . Osteomyelitis (HCC) 01/11/2014    Ashlei Chinchilla PT, DPT 05/10/2015, 7:02 PM  [facility] [address] [place], [place], [address] Phone: [phone]   Fax:  [phone]

## 2015-05-20 ENCOUNTER — Encounter: Payer: Self-pay | Admitting: Physical Therapy

## 2015-05-20 ENCOUNTER — Ambulatory Visit: Payer: Medicaid Other | Admitting: Physical Therapy

## 2015-05-20 DIAGNOSIS — R2681 Unsteadiness on feet: Secondary | ICD-10-CM

## 2015-05-20 DIAGNOSIS — R269 Unspecified abnormalities of gait and mobility: Secondary | ICD-10-CM | POA: Diagnosis not present

## 2015-05-20 DIAGNOSIS — Z89512 Acquired absence of left leg below knee: Secondary | ICD-10-CM

## 2015-05-20 DIAGNOSIS — Z4789 Encounter for other orthopedic aftercare: Secondary | ICD-10-CM

## 2015-05-20 DIAGNOSIS — R6889 Other general symptoms and signs: Secondary | ICD-10-CM

## 2015-05-20 DIAGNOSIS — R2689 Other abnormalities of gait and mobility: Secondary | ICD-10-CM

## 2015-05-21 NOTE — Therapy (Signed)
[facility] [address] [place], [place], [address] Phone: [phone]   Fax:  [phone]  Physical Therapy Treatment  Patient Details  Name: Ryan [name] MRN: [id number] Date of Birth: January 02, 1961 Referring Provider: Aldean Baker, MD  Encounter Date: 05/20/2015      PT End of Session - 05/20/15 0845    Visit Number 2   Number of Visits 9   Authorization Type Medicaid   Authorization Time Period 05/19/2015 - 07/13/2015   Authorization - Visit Number 1   Authorization - Number of Visits 8   PT Start Time 0847   PT Stop Time 0930   PT Time Calculation (min) 43 min   Equipment Utilized During Treatment Gait belt   Activity Tolerance Patient limited by pain   Behavior During Therapy [facility] for tasks assessed/performed      Past Medical History  Diagnosis Date  . Hypertension   . Osteomyelitis of foot (HCC)   . Heart murmur     was told "not to worry about it"  . Diabetes mellitus without complication (HCC)     type 2  . Diabetic neuropathy (HCC)   . Arthritis   . Anemia     low iron  . Peripheral vascular disease (HCC)     "poor circulation" in feet and necrosis    Past Surgical History  Procedure Laterality Date  . Amputation Right 01/13/2014    Procedure: Right great toe amputation;  Surgeon: Jacki Cones, MD;  Location: [facility];  Service: Orthopedics;  Laterality: Right;  . Knee surgery Right   . Arm surgery Left     due to broken arm  . Amputation Left 06/08/2014    Procedure: AMPUTATION LEFT GREAT TOE;  Surgeon: Verlee Rossetti, MD;  Location: [facility];  Service: Orthopedics;  Laterality: Left;  . Amputation Left 07/01/2014    Procedure: LEFT First Metatarsal RAY AMPUTATION ;  Surgeon: Verlee Rossetti, MD;  Location: [facility] OR;  Service: Orthopedics;  Laterality: Left;  . Amputation Left 08/28/2014    Procedure: Left Foot Fifth ray resection;  Surgeon: Kathryne Hitch, MD;  Location: [facility];   Service: Orthopedics;  Laterality: Left;  . Amputation Left 10/08/2014    Procedure: LEFT FOOT TRANSMETATARSAL AMPUTATION;  Surgeon: Kathryne Hitch, MD;  Location: [facility] OR;  Service: Orthopedics;  Laterality: Left;  . Amputation Left 12/04/2014    Procedure: AMPUTATION BELOW KNEE;  Surgeon: Nadara Mustard, MD;  Location: [facility] OR;  Service: Orthopedics;  Laterality: Left;    There were no vitals filed for this visit.  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 He has a sore on his leg now. He arrived wearing weights on legs (5# on RLE & 2# on LLE-prosthesis) to build up his leg strength.   Currently in Pain? No/denies            [facility] PT Assessment - 05/20/15 0845    Assessment   Referring Provider Aldean Baker, MD                     [facility] Adult PT Treatment/Exercise - 05/20/15 0845    Ambulation/Gait   [id number] >G>TEXTTAG>ranenne [phone] ValleyPershing ProudORVA GWALTNEYNewman NipMcGraw-Hill13m  [phone] Chandler5Marland Kitchen630 [phone]>56[place] enlyn SaranRenne [phone] BenningtonPershing ProudGARI HARTSELLNewman NipMcGraw-Hill23m  [phone] Chandler78Marland [phone]>56Melfa Gwenlyn SaranRenne [phone] ProudPARKE JANDREAUNewman NipMcGraw-Hill63m  [phone] Chandler41Marland [phone]>56[place] Gwenlyn SaranRenne [phone] ProudTYMEIR WEATHINGTONNewman NipMcGraw-Hill60m  PT LONG TERM GOAL #4   Title Berg Balance test >/= 52/56 to indicate lower fall risk   (Target Date: 8th treatment)    Baseline Berg Balance 42/56   Status New   PT LONG TERM GOAL #5   Title Functional Gait Assessment >/=19/30 to indicate lower fall risk with gait. (Target Date: 8th treatment)    Baseline Functional Gait Assessment 10/30   Status New   Additional Long Term Goals   Additional Long Term Goals Yes   PT LONG TERM GOAL #6   Title Patient performs work related tasks including pushing / pulling like mopping, vacuum, lifitng & carrying up to 30#, climbing ladders with prosthesis independently. 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   Status New               Plan - 05/20/15 0845    Clinical Impression Statement Patient needs pads added to prosthetic socket to decrease pressure on tibial crest and would benefit from deepening the hamstring relieves. PT set-up appointment immediately after PT today.  Patient's gait improved with instruction on proper step width and step length.   Pt will benefit from skilled therapeutic intervention in  order to improve on the following deficits Abnormal gait;Decreased activity tolerance;Decreased balance;Decreased endurance;Decreased mobility;Decreased skin integrity;Prosthetic Dependency   Rehab Potential Good   PT Frequency 2x / week   PT Duration 4 weeks   PT Treatment/Interventions ADLs/Self Care Home Management;DME Instruction;Gait training;Stair training;Functional mobility training;Therapeutic activities;Therapeutic exercise;Balance training;Neuromuscular re-education;Patient/family education;Prosthetic Training   PT Next Visit Plan Review prosthetic care, prosthetic gait including barriers and grass, instruct in lifting   Consulted and Agree with Plan of Care Patient        Problem List Patient Active Problem List   Diagnosis Date Noted  . S/P BKA (below knee amputation) unilateral (HCC) 12/04/2014  . S/P transmetatarsal amputation of foot (HCC) 10/08/2014  . Gangrene of foot (HCC) 08/27/2014  . Foot osteomyelitis, left (HCC) 08/27/2014  . Toe amputation status (HCC) 07/01/2014  . Essential hypertension 06/23/2014  . Osteomyelitis of left foot (HCC) 06/08/2014  . Osteomyelitis of toe (HCC) 06/08/2014  . Diabetes mellitus type 2 with complications (HCC)   . Diabetic foot (HCC) 05/07/2014  . Osteomyelitis of right foot (HCC) 01/13/2014  . Leukocytosis 01/12/2014  . Gangrene associated with diabetes mellitus (HCC) 01/11/2014  . Osteomyelitis (HCC) 01/11/2014    Rosena Bartle PT, DPT 05/21/2015, 9:27 AM  [facility] [address] [place], [place], [address] Phone: [phone]   Fax:  [phone]  Name: Ryan [name] MRN: [id number] Date of Birth: [birth year]

## 2015-05-24 ENCOUNTER — Encounter: Payer: Self-pay | Admitting: Physical Therapy

## 2015-05-24 ENCOUNTER — Ambulatory Visit: Payer: Medicaid Other | Admitting: Physical Therapy

## 2015-05-24 DIAGNOSIS — R2681 Unsteadiness on feet: Secondary | ICD-10-CM

## 2015-05-24 DIAGNOSIS — R269 Unspecified abnormalities of gait and mobility: Secondary | ICD-10-CM | POA: Diagnosis not present

## 2015-05-24 DIAGNOSIS — Z89512 Acquired absence of left leg below knee: Secondary | ICD-10-CM

## 2015-05-24 DIAGNOSIS — R6889 Other general symptoms and signs: Secondary | ICD-10-CM

## 2015-05-24 DIAGNOSIS — Z4789 Encounter for other orthopedic aftercare: Secondary | ICD-10-CM

## 2015-05-24 DIAGNOSIS — R2689 Other abnormalities of gait and mobility: Secondary | ICD-10-CM

## 2015-05-24 NOTE — Therapy (Signed)
[facility] [address] [place], [place], [address] Phone: [phone]   Fax:  [phone]  Physical Therapy Treatment  Patient Details  Name: Ryan [name] MRN: [id number] Date of Birth: [birth year] Referring Provider: Aldean BakerMarcus Duda, MD  Encounter Date: 05/24/2015      PT End of Session - 05/24/15 0930    Visit Number 3   Number of Visits 9   Authorization Type Medicaid   Authorization Time Period 05/19/2015 - 07/13/2015   Authorization - Visit Number 2   Authorization - Number of Visits 8   PT Start Time 0932   PT Stop Time 1020   PT Time Calculation (min) 48 min   Equipment Utilized During Treatment Gait belt   Activity Tolerance Patient limited by pain   Behavior During Therapy [facility] for tasks assessed/performed      Past Medical History  Diagnosis Date  . Hypertension   . Osteomyelitis of foot (HCC)   . Heart murmur     was told "not to worry about it"  . Diabetes mellitus without complication (HCC)     type 2  . Diabetic neuropathy (HCC)   . Arthritis   . Anemia     low iron  . Peripheral vascular disease (HCC)     "poor circulation" in feet and necrosis    Past Surgical History  Procedure Laterality Date  . Amputation Right 01/13/2014    Procedure: Right great toe amputation;  Surgeon: Jacki Conesonald A Gioffre, MD;  Location: [facility];  Service: Orthopedics;  Laterality: Right;  . Knee surgery Right   . Arm surgery Left     due to broken arm  . Amputation Left 06/08/2014    Procedure: AMPUTATION LEFT GREAT TOE;  Surgeon: Verlee RossettiSteven R Norris, MD;  Location: [facility];  Service: Orthopedics;  Laterality: Left;  . Amputation Left 07/01/2014    Procedure: LEFT First Metatarsal RAY AMPUTATION ;  Surgeon: Verlee RossettiSteven R Norris, MD;  [facility];  Service: Orthopedics;  Laterality: Left;  . Amputation Left 08/28/2014    Procedure: Left Foot Fifth ray resection;  Surgeon: Kathryne Hitchhristopher Y Blackman, MD;  Location: [facility];   Service: Orthopedics;  Laterality: Left;  . Amputation Left 10/08/2014    Procedure: LEFT FOOT TRANSMETATARSAL AMPUTATION;  Surgeon: Kathryne Hitchhristopher Y Blackman, MD;  [facility];  Service: Orthopedics;  Laterality: Left;  . Amputation Left 12/04/2014    Procedure: AMPUTATION BELOW KNEE;  Surgeon: Nadara MustardMarcus Duda V, MD;  Location: [facility] OR;  Service: Orthopedics;  Laterality: Left;    There were no vitals filed for this visit.  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   Currently in Pain? No/denies                         [facility] Adult PT Treatment/Exercise - 05/24/15 0930    Ambulation/Gait   Ambulation/Gait Yes   Ambulation/Gait Assistance 5: Supervision   Ambulation/Gait Assistance Details demo & cues on step length & pelvic motion with turning with both prosthesis on inside & outside of turn; knee extension at terminal stance with intial contact with heel (tactile &  verbal cues)   Ambulation Distance (Feet) 250 Feet  250' X 2   Assistive device Prosthesis   Gait Pattern Step-through pattern;Decreased step length - right;Decreased stance time - left;Decreased stride length;Decreased hip/knee flexion - left;Decreased weight shift to left;Left flexed knee in stance;Antalgic;Abducted - left;Poor foot clearance - left   Ambulation Surface Indoor;Level   Stairs Yes   Stairs Assistance 5: Supervision   Stairs Assistance Details (indicate cue type and reason) demo & verbal cues on technique for reciprocal pattern with prosthesis    Stair Management Technique Forwards;Two rails;Alternating pattern   Number of Stairs 4  7 reps   Height of Stairs 6   Ramp  4: Min assist  prosthesis only   Ramp Details (indicate cue type and reason) treadmill: ascend & descend 7* 1.2 mph with manual & verbal cues on knee motion, wt shift over prosthesis and posture; carryover on actual ramp with prosthesis only with verbal cues    Curb 4: Min assist  prosthesis only   Curb Details (indicate cue type and reason) cues on technique including step thru to improve balance and maintain momentum   Pre-Gait Activities Treadmill 1. X 5 min with manual & verbal cues on knee motion & step length to facilitate knee extension initial contact to mid-stance   Posture/Postural Control   Posture/Postural Control Postural limitations   Postural Limitations Forward head;Weight shift right   High Level Balance   High Level Balance Activities Figure 8 turns;Direction changes;Negotitating around obstacles   High Level Balance Comments PT demo & verbal cues on technique   Therapeutic Activites    Therapeutic Activities Lifting   Lifting PT demo, instructed prior & tactile/verbal cues during for technique to keep prosthetic foot flat with wt bearing, bending knees & hips to protect back and carry close to body symmetrically. Patient return demo with cane & 15# box.   Prosthetics   Prosthetic Care Comments  PT demo, instructed biking including foot position & seat ht to keep prosthetic foot on pedal, mount/dismounting, starting/stopping and turning. Pt verbalized understanding   Current prosthetic wear tolerance (days/week)  daily   Current prosthetic wear tolerance (#hours/day)  reports all awake hours   Edema no pitting edema   Residual limb condition  2mm superficial wound on tibial crest with no signs of infection. Properly covered with Tegaderm as instructed last session. Prosthetist also has added pre-tibial pads to relief area.   Education Provided Residual limb care;Correct ply sock adjustment;Proper wear schedule/adjustment;Skin check  use of anti-perspirant, drying q4 hrs.  see prosthetic care   Person(s) Educated Patient   Education Method Explanation;Demonstration;Tactile cues;Verbal cues   Education Method Verbalized understanding;Returned demonstration;Tactile cues required;Verbal cues required;Needs further instruction                     PT Long Term Goals - 05/10/15 1015    PT LONG TERM GOAL #1   Title Patient verbalizes proper prosthetic care including donning, adjusting ply socks, residual limb management & skin integrity to enable safe use of prosthesis. (Target Date: 8th treatment)    Baseline Patient is dependent in all aspects of prosthetic care. He has redness on residual limb from improper wear / use for 14 days since delivery.   Status New   PT LONG TERM GOAL #2   Title Patient tolerates prosthesis wear >90% of awake hours with no skin issues or tenderness. (Target Date: 8th treatment)    Baseline Patient wears prosthesis >10hours daily but has redness  on limb due to improper advancement of wear.   Status New   PT LONG TERM GOAL #3   Title Patient ambulates >1000' including grass, ramps & curbs with prosthesis only independently. (Target Date: 8th treatment)    Baseline Patient ambulates 250' with prosthesis and cane with supervision.    Status New   PT LONG TERM GOAL #4   Title Berg Balance test >/= 52/56 to indicate lower fall risk   (Target Date: 8th treatment)    Baseline Berg Balance 42/56   Status New   PT LONG TERM GOAL #5   Title Functional Gait Assessment >/=19/30 to indicate lower fall risk with gait. (Target Date: 8th treatment)    Baseline Functional Gait Assessment 10/30   Status New   Additional Long Term Goals   Additional Long Term Goals Yes   PT LONG TERM GOAL #6   Title Patient performs work related tasks including pushing / pulling like mopping, vacuum, lifitng & carrying up to 30#, climbing ladders with prosthesis independently. 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   Status New               Plan - 05/24/15 0930    Clinical Impression Statement Patient's wound appears to be healing with patient following PT recommendation to use Tegaderm as barrier. He verbalized understanding of techniques to bicycle with prosthesis and recommendation to initiatei with stationery bike. He appears to understand lifting basics.    Pt will benefit from skilled therapeutic intervention in order to improve on the following deficits Abnormal gait;Decreased activity tolerance;Decreased balance;Decreased endurance;Decreased mobility;Decreased skin integrity;Prosthetic Dependency   Rehab Potential Good   PT Frequency 2x / week   PT Duration 4 weeks   PT Treatment/Interventions ADLs/Self Care Home Management;DME Instruction;Gait training;Stair training;Functional mobility training;Therapeutic activities;Therapeutic exercise;Balance training;Neuromuscular re-education;Patient/family education;Prosthetic Training   PT Next Visit Plan Review prosthetic care, prosthetic gait including barriers and grass, instruct in lifting   Consulted and Agree with Plan of Care Patient        Problem List Patient Active Problem List   Diagnosis Date Noted  . S/P BKA (below knee amputation) unilateral (HCC) 12/04/2014  . S/P transmetatarsal amputation of foot (HCC) 10/08/2014  . Gangrene of foot (HCC) 08/27/2014  . Foot osteomyelitis, left (HCC) 08/27/2014  . Toe amputation status (HCC) 07/01/2014  . Essential hypertension 06/23/2014  . Osteomyelitis of left foot (HCC) 06/08/2014  . Osteomyelitis of toe (HCC) 06/08/2014  . Diabetes mellitus type 2 with complications (HCC)   . Diabetic foot (HCC) 05/07/2014  . Osteomyelitis of right foot (HCC) 01/13/2014  . Leukocytosis 01/12/2014  . Gangrene associated with diabetes mellitus (HCC) 01/11/2014  . Osteomyelitis (HCC) 01/11/2014    Isabela Nardelli  PT, DPT 05/24/2015, 7:17 PM  [place] [facility] [address] [place], [place], [address] Phone: [phone]   Fax:  [phone]  Name: Ryan [name] MRN: [id number] Date of Birth: [birth year]

## 2015-05-27 ENCOUNTER — Encounter: Payer: Self-pay | Admitting: Physical Therapy

## 2015-05-27 ENCOUNTER — Ambulatory Visit: Payer: Medicaid Other | Admitting: Physical Therapy

## 2015-05-27 DIAGNOSIS — R269 Unspecified abnormalities of gait and mobility: Secondary | ICD-10-CM | POA: Diagnosis not present

## 2015-05-27 DIAGNOSIS — R2681 Unsteadiness on feet: Secondary | ICD-10-CM

## 2015-05-27 DIAGNOSIS — Z4789 Encounter for other orthopedic aftercare: Secondary | ICD-10-CM

## 2015-05-27 DIAGNOSIS — Z89512 Acquired absence of left leg below knee: Secondary | ICD-10-CM

## 2015-05-27 DIAGNOSIS — R2689 Other abnormalities of gait and mobility: Secondary | ICD-10-CM

## 2015-05-27 DIAGNOSIS — R6889 Other general symptoms and signs: Secondary | ICD-10-CM

## 2015-05-27 NOTE — Therapy (Signed)
[facility] [address] [place], [place], [address] Phone: [phone]   Fax:  [phone]  Physical Therapy Treatment  Patient Details  Name: Ryan [name] MRN: [id number] Date of Birth: [birth year] Referring Provider: Aldean Baker, MD  Encounter Date: 05/27/2015      PT End of Session - 05/27/15 1100    Visit Number 4   Number of Visits 9   Authorization Type Medicaid   Authorization Time Period 05/19/2015 - 07/13/2015   Authorization - Visit Number 3   Authorization - Number of Visits 8   PT Start Time 1100   PT Stop Time 1145   PT Time Calculation (min) 45 min   Equipment Utilized During Treatment Gait belt   Activity Tolerance Patient limited by pain   Behavior During Therapy [facility] for tasks assessed/performed      Past Medical History  Diagnosis Date  . Hypertension   . Osteomyelitis of foot (HCC)   . Heart murmur     was told "not to worry about it"  . Diabetes mellitus without complication (HCC)     type 2  . Diabetic neuropathy (HCC)   . Arthritis   . Anemia     low iron  . Peripheral vascular disease (HCC)     "poor circulation" in feet and necrosis    Past Surgical History  Procedure Laterality Date  . Amputation Right 01/13/2014    Procedure: Right great toe amputation;  Surgeon: Jacki Cones, MD;  Location: [facility];  Service: Orthopedics;  Laterality: Right;  . Knee surgery Right   . Arm surgery Left     due to broken arm  . Amputation Left 06/08/2014    Procedure: AMPUTATION LEFT GREAT TOE;  Surgeon: Verlee Rossetti, MD;  Location: [facility];  Service: Orthopedics;  Laterality: Left;  . Amputation Left 07/01/2014    Procedure: LEFT First Metatarsal RAY AMPUTATION ;  Surgeon: Verlee Rossetti, MD;  Location: [facility] OR;  Service: Orthopedics;  Laterality: Left;  . Amputation Left 08/28/2014    Procedure: Left Foot Fifth ray resection;  Surgeon: Kathryne Hitch, MD;  Location: [facility];   Service: Orthopedics;  Laterality: Left;  . Amputation Left 10/08/2014    Procedure: LEFT FOOT TRANSMETATARSAL AMPUTATION;  Surgeon: Kathryne Hitch, MD;  Location: [facility] OR;  Service: Orthopedics;  Laterality: Left;  . Amputation Left 12/04/2014    Procedure: AMPUTATION BELOW KNEE;  Surgeon: Nadara Mustard, MD;  Location: [facility];  Service: Orthopedics;  Laterality: Left;    There were no vitals filed for this visit.  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 He tried his bike again using what PT taught him last session and it went better. No falls.   Currently in Pain? (p) No/denies                         [facility] Adult PT Treatment/Exercise - 05/27/15 1100    Ambulation/Gait   Ambulation/Gait Yes   Ambulation/Gait Assistance 5: Supervision   Ambulation/Gait Assistance Details verbal cues on knee motion at terminal swing and stance, knee control on grass, uneven surface   Ambulation Distance (Feet) 600 Feet  600' outside and 300' inside   Assistive device Prosthesis   Gait Pattern Step-through pattern;Decreased step length - right;Decreased stance time - left;Decreased stride length;Decreased hip/knee flexion - left;Decreased weight shift to left;Left flexed knee in stance;Antalgic;Abducted - left;Poor foot clearance - left   Ambulation Surface Indoor;Level;Outdoor;Paved;Gravel;Grass;Other (comment)  grass slope   Stairs Yes   Stairs Assistance 5: Supervision   Stairs Assistance Details (indicate cue type and reason) cues on techique with single rail and without rail   Stair Management Technique Forwards;Two rails;Alternating pattern;One rail Left;One rail Right;Step to pattern;No rails   reciprocal 2 rails & 1 rail, step-to no rails   Number of Stairs 4  7 reps   Height of Stairs 6   Ramp 5: Supervision  prosthesis only   Ramp Details (indicate cue type and reason) cues on knee control   Curb 5: Supervision  prosthesis only   Curb Details (indicate cue type and reason) cues on technique, maintaining gait & ascending /descending with either LE   Posture/Postural Control   Posture/Postural Control Postural limitations   Postural Limitations Forward head;Weight shift right   High Level Balance   High Level Balance Activities Negotiating over obstacles   High Level Balance Comments PT demo & verbal cues on technique   Therapeutic Activites    Therapeutic Activities Lifting;Work Garment/textile technologist, instructed prior & tactile/verbal cues during for technique to keep prosthetic foot flat with wt bearing, bending knees & hips to protect back and carry close to body symmetrically. Patient return demo with cane & 15# & 25# box.   Work Simulation [employer] shifting for vacuuming, mopping, weed eating, Patient return demo with verbal cues.   Prosthetics   Prosthetic Care Comments  Use of baby oil to reduce friction from liner at patella with knee flexion, changing shoes with same heel ht.   Current prosthetic wear tolerance (days/week)  daily   Current prosthetic wear tolerance (#hours/day)  reports all awake hours   Edema no pitting edema   Residual limb condition  2mm superficial wound on tibial crest with no signs of infection. Properly covered with Tegaderm as instructed last session. Prosthetist also has added pre-tibial pads to relief area.   Education Provided Residual limb care;Correct ply sock adjustment;Proper wear schedule/adjustment;Skin check  use of anti-perspirant, drying q4 hrs. see prosthetic care   Person(s) Educated Patient   Education Method Explanation;Demonstration;Tactile cues;Verbal cues   Education Method Verbalized understanding;Returned  demonstration;Tactile cues required;Needs further instruction;Verbal cues required                     PT Long Term Goals - 05/10/15 1015    PT LONG TERM GOAL #1   Title Patient verbalizes proper prosthetic care including donning, adjusting ply socks, residual limb management & skin integrity to enable safe use of prosthesis. (Target Date: 8th treatment)    Baseline Patient is dependent in all aspects of prosthetic care. He has redness on residual limb from improper wear / use for 14 days since delivery.   Status New   PT LONG TERM GOAL #2   Title Patient tolerates prosthesis wear >90% of awake hours with no skin issues or tenderness. (Target Date: 8th treatment)    Baseline Patient wears prosthesis >10hours daily but has redness on limb due to improper advancement of wear.   Status New   PT LONG TERM GOAL #3   Title Patient ambulates >1000' including grass, ramps & curbs with prosthesis only independently. (Target  Date: 8th treatment)    Baseline Patient ambulates 250' with prosthesis and cane with supervision.    Status New   PT LONG TERM GOAL #4   Title Berg Balance test >/= 52/56 to indicate lower fall risk   (Target Date: 8th treatment)    Baseline Berg Balance 42/56   Status New   PT LONG TERM GOAL #5   Title Functional Gait Assessment >/=19/30 to indicate lower fall risk with gait. (Target Date: 8th treatment)    Baseline Functional Gait Assessment 10/30   Status New   Additional Long Term Goals   Additional Long Term Goals Yes   PT LONG TERM GOAL #6   Title Patient performs work related tasks including pushing / pulling like mopping, vacuum, lifitng & carrying up to 30#, climbing ladders with prosthesis independently. 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   Status New               Plan - 05/27/15 1100    Clinical Impression Statement  Patient improved knee control with gait including barriers. Patient appears to understand work simulation tasks with prosthesis with instructions.    Pt will benefit from skilled therapeutic intervention in order to improve on the following deficits Abnormal gait;Decreased activity tolerance;Decreased balance;Decreased endurance;Decreased mobility;Decreased skin integrity;Prosthetic Dependency   Rehab Potential Good   PT Frequency 2x / week   PT Duration 4 weeks   PT Treatment/Interventions ADLs/Self Care Home Management;DME Instruction;Gait training;Stair training;Functional mobility training;Therapeutic activities;Therapeutic exercise;Balance training;Neuromuscular re-education;Patient/family education;Prosthetic Training   PT Next Visit Plan Review prosthetic care, prosthetic gait including barriers and grass, work simulation tasks   Consulted and Agree with Plan of Care Patient        Problem List Patient Active Problem List   Diagnosis Date Noted  . S/P BKA (below knee amputation) unilateral (HCC) 12/04/2014  . S/P transmetatarsal amputation of foot (HCC) 10/08/2014  . Gangrene of foot (HCC) 08/27/2014  . Foot osteomyelitis, left (HCC) 08/27/2014  . Toe amputation status (HCC) 07/01/2014  . Essential hypertension 06/23/2014  . Osteomyelitis of left foot (HCC) 06/08/2014  . Osteomyelitis of toe (HCC) 06/08/2014  . Diabetes mellitus type 2 with complications (HCC)   . Diabetic foot (HCC) 05/07/2014  . Osteomyelitis of right foot (HCC) 01/13/2014  . Leukocytosis 01/12/2014  . Gangrene associated with diabetes mellitus (HCC) 01/11/2014  . Osteomyelitis (HCC) 01/11/2014    Jennea Rager PT, DPT 05/27/2015, 11:08 PM  [facility] [address] [place], [place], [address] Phone: [phone]   Fax:  [phone]  Name: Ryan [name] MRN: [id number] Date of Birth: Aug 21, 1960

## 2015-06-01 ENCOUNTER — Encounter: Payer: Self-pay | Admitting: Physical Therapy

## 2015-06-01 ENCOUNTER — Ambulatory Visit: Payer: Medicaid Other | Attending: Orthopedic Surgery | Admitting: Physical Therapy

## 2015-06-01 DIAGNOSIS — Z89512 Acquired absence of left leg below knee: Secondary | ICD-10-CM | POA: Diagnosis present

## 2015-06-01 DIAGNOSIS — Z5189 Encounter for other specified aftercare: Secondary | ICD-10-CM | POA: Insufficient documentation

## 2015-06-01 DIAGNOSIS — R29818 Other symptoms and signs involving the nervous system: Secondary | ICD-10-CM | POA: Insufficient documentation

## 2015-06-01 DIAGNOSIS — R6889 Other general symptoms and signs: Secondary | ICD-10-CM

## 2015-06-01 DIAGNOSIS — Z4789 Encounter for other orthopedic aftercare: Secondary | ICD-10-CM

## 2015-06-01 DIAGNOSIS — R269 Unspecified abnormalities of gait and mobility: Secondary | ICD-10-CM

## 2015-06-01 DIAGNOSIS — R2681 Unsteadiness on feet: Secondary | ICD-10-CM

## 2015-06-01 DIAGNOSIS — R2689 Other abnormalities of gait and mobility: Secondary | ICD-10-CM

## 2015-06-01 NOTE — Therapy (Signed)
[facility] [address] [place], [place], [address] Phone: [phone]   Fax:  [phone]  Physical Therapy Treatment  Patient Details  Name: Ryan [name] MRN: [id number] Date of Birth: June 15, 1961 Referring Provider: Aldean Baker, MD  Encounter Date: 06/01/2015      PT End of Session - 06/01/15 0845    Visit Number 5   Number of Visits 9   Authorization Type Medicaid   Authorization Time Period 05/19/2015 - 07/13/2015   Authorization - Visit Number 4   Authorization - Number of Visits 8   PT Start Time 0845   PT Stop Time 0930   PT Time Calculation (min) 45 min   Equipment Utilized During Treatment Gait belt   Activity Tolerance Patient limited by pain   Behavior During Therapy [facility] for tasks assessed/performed      Past Medical History  Diagnosis Date  . Hypertension   . Osteomyelitis of foot (HCC)   . Heart murmur     was told "not to worry about it"  . Diabetes mellitus without complication (HCC)     type 2  . Diabetic neuropathy (HCC)   . Arthritis   . Anemia     low iron  . Peripheral vascular disease (HCC)     "poor circulation" in feet and necrosis    Past Surgical History  Procedure Laterality Date  . Amputation Right 01/13/2014    Procedure: Right great toe amputation;  Surgeon: Jacki Cones, MD;  Location: [facility];  Service: Orthopedics;  Laterality: Right;  . Knee surgery Right   . Arm surgery Left     due to broken arm  . Amputation Left 06/08/2014    Procedure: AMPUTATION LEFT GREAT TOE;  Surgeon: Verlee Rossetti, MD;  Location: [facility];  Service: Orthopedics;  Laterality: Left;  . Amputation Left 07/01/2014    Procedure: LEFT First Metatarsal RAY AMPUTATION ;  Surgeon: Verlee Rossetti, MD;  Location: [facility] OR;  Service: Orthopedics;  Laterality: Left;  . Amputation Left 08/28/2014    Procedure: Left Foot Fifth ray resection;  Surgeon: Kathryne Hitch, MD;  Location: [facility];   Service: Orthopedics;  Laterality: Left;  . Amputation Left 10/08/2014    Procedure: LEFT FOOT TRANSMETATARSAL AMPUTATION;  Surgeon: Kathryne Hitch, MD;  Location: [facility] OR;  Service: Orthopedics;  Laterality: Left;  . Amputation Left 12/04/2014    Procedure: AMPUTATION BELOW KNEE;  Surgeon: Nadara Mustard, MD;  [facility]: [facility];  Service: Orthopedics;  Laterality: Left;    There were no vitals filed for this visit.  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 The back of liner rolls down and bunches up.    Currently in Pain? No/denies     Prosthetic Training: Wound on  Limb at distal tibia has healed. No other issues with limb noted. PT recommended managing liner & socks by pulling pants down (like in bathroom) vs pulling pant leg up to decrease liner rolling. Pt verbalized understanding. PT lifted boxes progressing wt form 15# to 25# to 45# to 65# with verbal cues on technique. Stairs: reciprocal with 2 rails and single rail on each side with prosthesis with cues. Ladder: Patient able to lift /carry A-frame ladder  with supervision, climb ladder with verbal cues on technique. Pt required cues to position self to let go with 2 hands to simulate performing a task with min guard. Bicycle: PT verbally cued mounting / dismounting, prosthetic foot position on pedal / seat ht, starting /stopping and turning while patient performed with supervision.                             PT Education - 06/01/15 0930    Education provided Yes   Education Details Amputee Support Group   Person(s) Educated Patient   Methods Explanation;Handout   Comprehension Verbalized understanding             PT Long Term Goals - 05/10/15 1015     PT LONG TERM GOAL #1   Title Patient verbalizes proper prosthetic care including donning, adjusting ply socks, residual limb management & skin integrity to enable safe use of prosthesis. (Target Date: 8th treatment)    Baseline Patient is dependent in all aspects of prosthetic care. He has redness on residual limb from improper wear / use for 14 days since delivery.   Status New   PT LONG TERM GOAL #2   Title Patient tolerates prosthesis wear >90% of awake hours with no skin issues or tenderness. (Target Date: 8th treatment)    Baseline Patient wears prosthesis >10hours daily but has redness on limb due to improper advancement of wear.   Status New   PT LONG TERM GOAL #3   Title Patient ambulates >1000' including grass, ramps & curbs with prosthesis only independently. (Target Date: 8th treatment)    Baseline Patient ambulates 250' with prosthesis and cane with supervision.    Status New   PT LONG TERM GOAL #4   Title Berg Balance test >/= 52/56 to indicate lower fall risk   (Target Date: 8th treatment)    Baseline Berg Balance 42/56   Status New   PT LONG TERM GOAL #5   Title Functional Gait Assessment >/=19/30 to indicate lower fall risk with gait. (Target Date: 8th treatment)    Baseline Functional Gait Assessment 10/30   Status New   Additional Long Term Goals   Additional Long Term Goals Yes   PT LONG TERM GOAL #6   Title Patient performs work related tasks including pushing / pulling like mopping, vacuum, lifitng & carrying up to 30#, climbing ladders with prosthesis independently. 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   Status New               Plan - 06/01/15 0845    Clinical Impression Statement Patient improved ability to lift weighted boxes and climb ladders with prosthesis with instruction. Patient improved ablity to ride a bike with prosthesis  including mounting/dismounting, starting /stopping and turning.   Pt will benefit from skilled therapeutic intervention in order to improve on the following deficits Abnormal gait;Decreased activity tolerance;Decreased balance;Decreased endurance;Decreased mobility;Decreased skin integrity;Prosthetic Dependency   Rehab Potential Good   PT Frequency 2x / week   PT Duration 4 weeks   PT Treatment/Interventions ADLs/Self Care Home Management;DME Instruction;Gait training;Stair training;Functional mobility training;Therapeutic activities;Therapeutic exercise;Balance training;Neuromuscular re-education;Patient/family education;Prosthetic Training   PT Next Visit Plan Review prosthetic care, prosthetic gait including barriers and grass, work simulation tasks   Consulted and Agree with Plan of Care Patient  Problem List Patient Active Problem List   Diagnosis Date Noted  . S/P BKA (below knee amputation) unilateral (HCC) 12/04/2014  . S/P transmetatarsal amputation of foot (HCC) 10/08/2014  . Gangrene of foot (HCC) 08/27/2014  . Foot osteomyelitis, left (HCC) 08/27/2014  . Toe amputation status (HCC) 07/01/2014  . Essential hypertension 06/23/2014  . Osteomyelitis of left foot (HCC) 06/08/2014  . Osteomyelitis of toe (HCC) 06/08/2014  . Diabetes mellitus type 2 with complications (HCC)   . Diabetic foot (HCC) 05/07/2014  . Osteomyelitis of right foot (HCC) 01/13/2014  . Leukocytosis 01/12/2014  . Gangrene associated with diabetes mellitus (HCC) 01/11/2014  . Osteomyelitis (HCC) 01/11/2014    Meleena Munroe PT, DPT 06/01/2015, 12:58 PM  [facility] [address] [place], [place], [address] Phone: [phone]   Fax:  [phone]  Name: Ryan [name] MRN: [id number] Date of Birth: 07-16-1961

## 2015-06-03 ENCOUNTER — Ambulatory Visit: Payer: Medicaid Other | Admitting: Physical Therapy

## 2015-06-03 ENCOUNTER — Encounter: Payer: Self-pay | Admitting: Physical Therapy

## 2015-06-03 DIAGNOSIS — R2681 Unsteadiness on feet: Secondary | ICD-10-CM | POA: Diagnosis not present

## 2015-06-03 DIAGNOSIS — Z89512 Acquired absence of left leg below knee: Secondary | ICD-10-CM

## 2015-06-03 DIAGNOSIS — R2689 Other abnormalities of gait and mobility: Secondary | ICD-10-CM

## 2015-06-03 DIAGNOSIS — R269 Unspecified abnormalities of gait and mobility: Secondary | ICD-10-CM

## 2015-06-03 DIAGNOSIS — R6889 Other general symptoms and signs: Secondary | ICD-10-CM

## 2015-06-03 DIAGNOSIS — Z4789 Encounter for other orthopedic aftercare: Secondary | ICD-10-CM

## 2015-06-03 NOTE — Therapy (Signed)
[facility] [address] [place], [place], [address] Phone: [phone]   Fax:  [phone]  Physical Therapy Treatment  Patient Details  Name: Ryan [name] MRN: [id number] Date of Birth: Nov 22, 1960 Referring Provider: Aldean Baker, MD  Encounter Date: 06/03/2015      PT End of Session - 06/03/15 1100    Visit Number 6   Number of Visits 9   Authorization Type Medicaid   Authorization Time Period 05/19/2015 - 07/13/2015   Authorization - Visit Number 5   Authorization - Number of Visits 8   PT Start Time 1015   PT Stop Time 1055   PT Time Calculation (min) 40 min   Equipment Utilized During Treatment Gait belt   Activity Tolerance Patient limited by pain   Behavior During Therapy [facility] for tasks assessed/performed      Past Medical History  Diagnosis Date  . Hypertension   . Osteomyelitis of foot (HCC)   . Heart murmur     was told "not to worry about it"  . Diabetes mellitus without complication (HCC)     type 2  . Diabetic neuropathy (HCC)   . Arthritis   . Anemia     low iron  . Peripheral vascular disease (HCC)     "poor circulation" in feet and necrosis    Past Surgical History  Procedure Laterality Date  . Amputation Right 01/13/2014    Procedure: Right great toe amputation;  Surgeon: Jacki Cones, MD;  Location: [facility];  Service: Orthopedics;  Laterality: Right;  . Knee surgery Right   . Arm surgery Left     due to broken arm  . Amputation Left 06/08/2014    Procedure: AMPUTATION LEFT GREAT TOE;  Surgeon: Verlee Rossetti, MD;  Location: [facility];  Service: Orthopedics;  Laterality: Left;  . Amputation Left 07/01/2014    Procedure: LEFT First Metatarsal RAY AMPUTATION ;  Surgeon: Verlee Rossetti, MD;  Location: [facility] OR;  Service: Orthopedics;  Laterality: Left;  . Amputation Left 08/28/2014    Procedure: Left Foot Fifth ray resection;  Surgeon: Kathryne Hitch, MD;  Location: [facility];   Service: Orthopedics;  Laterality: Left;  . Amputation Left 10/08/2014    Procedure: LEFT FOOT TRANSMETATARSAL AMPUTATION;  Surgeon: Kathryne Hitch, MD;  Location: [facility];  Service: Orthopedics;  Laterality: Left;  . Amputation Left 12/04/2014    Procedure: AMPUTATION BELOW KNEE;  Surgeon: Nadara Mustard, MD;  Location: [facility] OR;  Service: Orthopedics;  Laterality: Left;    There were no vitals filed for this visit.  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    Currently in Pain? No/denies                         [facility] Adult PT Treatment/Exercise - 06/03/15 1015    Ambulation/Gait   Ambulation/Gait Yes   Ambulation/Gait Assistance 5: Supervision   Ambulation/Gait Assistance Details verbal & tactile cues on changing gait speed, scanning environment while maintaining path & pace,    Ambulation Distance (Feet) 600 Feet  800' outside and 500' inside   Assistive device Prosthesis   Gait Pattern Step-through pattern;Decreased stance time - left;Left  flexed knee in stance   Ambulation Surface Indoor;Level;Outdoor;Unlevel;Paved;Gravel;Grass;Other (comment)  grass slopes / hill   Stairs Yes   Stairs Assistance 5: Supervision   Stairs Assistance Details (indicate cue type and reason) cues on safety with reciprocal with no rails, PT does not recommend if >5 steps   Stair Management Technique Forwards;Alternating pattern;No rails   Number of Stairs 4  2 reps   Height of Stairs 6   Ramp 5: Supervision  prosthesis only   Ramp Details (indicate cue type and reason) verbal cues on knee control and wt shift   Curb 5: Supervision  prosthesis only   Curb Details (indicate cue type and reason) verbal cues on step thru with  2nd leg to maintain momentum and improve balance, Not modifiying sequence as he can ascend or descend with either LE to maintain cadence    Balance   Balance Assessed Yes   High Level Balance   High Level Balance Activities Negotiating over obstacles;Side stepping;Direction changes;Negotitating around obstacles   High Level Balance Comments PT demo & verbal cues on technique with prosthesis   Therapeutic Activites    Therapeutic Activities Lifting;Work Field seismologist cues to bend knees more to lower center of gravity and protect back. Patient able to lift & carry from lower & upper handles on box with 15#, 35# & 55#.    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   Current prosthetic wear tolerance (days/week)  daily   Current prosthetic wear tolerance (#hours/day)  reports all awake hours   Edema no pitting edema   Residual limb condition  wound healed.    Education Provided Residual limb care;Correct ply sock adjustment;Proper wear schedule/adjustment;Skin check   Person(s) Educated Patient   Education Method Explanation;Verbal cues   Education Method Verbalized understanding;Verbal cues required                     PT Long Term Goals - 05/10/15 1015    PT LONG TERM GOAL #1   Title Patient verbalizes proper prosthetic care including donning, adjusting ply socks, residual limb management & skin integrity to enable safe use of prosthesis. (Target Date: 8th treatment)    Baseline Patient is dependent in all aspects of prosthetic care. He has redness on residual limb from improper wear / use for 14 days since delivery.   Status New   PT LONG TERM GOAL #2   Title Patient tolerates prosthesis wear >90% of awake hours with no  skin issues or tenderness. (Target Date: 8th treatment)    Baseline Patient wears prosthesis >10hours daily but has redness on limb due to improper advancement of wear.   Status New   PT LONG TERM GOAL #3   Title Patient ambulates >1000' including grass, ramps & curbs with prosthesis only independently. (Target Date: 8th treatment)    Baseline Patient ambulates 250' with prosthesis and cane with supervision.    Status New   PT LONG TERM GOAL #4   Title Berg Balance test >/= 52/56 to indicate lower fall risk   (Target Date: 8th treatment)    Baseline Berg Balance 42/56   Status New   PT LONG TERM GOAL #5   Title Functional Gait Assessment >/=19/30 to indicate lower fall risk with gait. (  Target Date: 8th treatment)    Baseline Functional Gait Assessment 10/30   Status New   Additional Long Term Goals   Additional Long Term Goals Yes   PT LONG TERM GOAL #6   Title Patient performs work related tasks including pushing / pulling like mopping, vacuum, lifitng & carrying up to 30#, climbing ladders with prosthesis independently. 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   Status New               Plan - 06/03/15 1100    Clinical Impression Statement Patient improved weight shifting work tasks and lifting from lower handles which requires him to get lower. Patient stepped off last runner of ladder with incorrect limb and was able to self correct but verbalized understanding why this would be hazardous from higher step.    Pt will benefit from skilled therapeutic intervention in order to improve on the following deficits Abnormal gait;Decreased activity tolerance;Decreased balance;Decreased endurance;Decreased mobility;Decreased skin integrity;Prosthetic Dependency   Rehab Potential Good   PT Frequency 2x / week   PT Duration 4 weeks   PT Treatment/Interventions ADLs/Self  Care Home Management;DME Instruction;Gait training;Stair training;Functional mobility training;Therapeutic activities;Therapeutic exercise;Balance training;Neuromuscular re-education;Patient/family education;Prosthetic Training   PT Next Visit Plan Review prosthetic care, prosthetic gait including barriers and grass, work simulation tasks   Consulted and Agree with Plan of Care Patient        Problem List Patient Active Problem List   Diagnosis Date Noted  . S/P BKA (below knee amputation) unilateral (HCC) 12/04/2014  . S/P transmetatarsal amputation of foot (HCC) 10/08/2014  . Gangrene of foot (HCC) 08/27/2014  . Foot osteomyelitis, left (HCC) 08/27/2014  . Toe amputation status (HCC) 07/01/2014  . Essential hypertension 06/23/2014  . Osteomyelitis of left foot (HCC) 06/08/2014  . Osteomyelitis of toe (HCC) 06/08/2014  . Diabetes mellitus type 2 with complications (HCC)   . Diabetic foot (HCC) 05/07/2014  . Osteomyelitis of right foot (HCC) 01/13/2014  . Leukocytosis 01/12/2014  . Gangrene associated with diabetes mellitus (HCC) 01/11/2014  . Osteomyelitis (HCC) 01/11/2014    Kadelyn Dimascio PT, DPT 06/03/2015, 1:15 PM  [facility] [address] [place], [place], [address] Phone: [phone]   Fax:  [phone]  Name: Ryan [name] MRN: [id number] Date of Birth: 1961-05-11

## 2015-06-07 ENCOUNTER — Ambulatory Visit: Payer: Medicaid Other | Admitting: Physical Therapy

## 2015-06-07 ENCOUNTER — Encounter: Payer: Self-pay | Admitting: Physical Therapy

## 2015-06-07 DIAGNOSIS — R6889 Other general symptoms and signs: Secondary | ICD-10-CM

## 2015-06-07 DIAGNOSIS — R2681 Unsteadiness on feet: Secondary | ICD-10-CM

## 2015-06-07 DIAGNOSIS — Z4789 Encounter for other orthopedic aftercare: Secondary | ICD-10-CM

## 2015-06-07 DIAGNOSIS — Z89512 Acquired absence of left leg below knee: Secondary | ICD-10-CM

## 2015-06-07 DIAGNOSIS — R2689 Other abnormalities of gait and mobility: Secondary | ICD-10-CM

## 2015-06-07 DIAGNOSIS — R269 Unspecified abnormalities of gait and mobility: Secondary | ICD-10-CM

## 2015-06-07 NOTE — Therapy (Signed)
[facility] [address] [place], [place], [address] Phone: [phone]   Fax:  [phone]  Physical Therapy Treatment  Patient Details  Name: Ryan [name] MRN: [id number] Date of Birth: 1960/09/08 Referring Provider: Aldean Baker, MD  Encounter Date: 06/07/2015      PT End of Session - 06/07/15 0930    Visit Number 7   Number of Visits 9   Authorization Type Medicaid   Authorization Time Period 05/19/2015 - 07/13/2015   Authorization - Visit Number 6   Authorization - Number of Visits 8   PT Start Time 0845   PT Stop Time 0927   PT Time Calculation (min) 42 min   Equipment Utilized During Treatment Gait belt   Activity Tolerance Patient limited by pain   Behavior During Therapy [facility] for tasks assessed/performed      Past Medical History  Diagnosis Date  . Hypertension   . Osteomyelitis of foot (HCC)   . Heart murmur     was told "not to worry about it"  . Diabetes mellitus without complication (HCC)     type 2  . Diabetic neuropathy (HCC)   . Arthritis   . Anemia     low iron  . Peripheral vascular disease (HCC)     "poor circulation" in feet and necrosis    Past Surgical History  Procedure Laterality Date  . Amputation Right 01/13/2014    Procedure: Right great toe amputation;  Surgeon: Jacki Cones, MD;  Location: [facility];  Service: Orthopedics;  Laterality: Right;  . Knee surgery Right   . Arm surgery Left     due to broken arm  . Amputation Left 06/08/2014    Procedure: AMPUTATION LEFT GREAT TOE;  Surgeon: Verlee Rossetti, MD;  Location: [facility];  Service: Orthopedics;  Laterality: Left;  . Amputation Left 07/01/2014    Procedure: LEFT First Metatarsal RAY AMPUTATION ;  Surgeon: Verlee Rossetti, MD;  Location: [facility] OR;  Service: Orthopedics;  Laterality: Left;  . Amputation Left 08/28/2014    Procedure: Left Foot Fifth ray resection;  Surgeon: Kathryne Hitch, MD;  Location: [facility];   Service: Orthopedics;  Laterality: Left;  . Amputation Left 10/08/2014    Procedure: LEFT FOOT TRANSMETATARSAL AMPUTATION;  Surgeon: Kathryne Hitch, MD;  Location: [facility] OR;  Service: Orthopedics;  Laterality: Left;  . Amputation Left 12/04/2014    Procedure: AMPUTATION BELOW KNEE;  Surgeon: Nadara Mustard, MD;  Location: [facility] OR;  Service: Orthopedics;  Laterality: Left;    There were no vitals filed for this visit.  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 Going to prosthetist after PT today.   Currently in Pain? No/denies                         [facility] Adult PT Treatment/Exercise - 06/07/15 0845    Ambulation/Gait   Ambulation/Gait Yes   Ambulation/Gait Assistance 5: Supervision   Ambulation Distance (Feet) 1000 Feet  1000' outside & 400' inside   Assistive device Prosthesis   Gait Pattern Step-through pattern;Decreased stance time - left;Left flexed knee in stance   Ambulation Surface Indoor;Level;Outdoor;Paved;Gravel;Grass   Stairs Yes   Stairs  Assistance 5: Supervision   Stair Management Technique Forwards;Alternating pattern;No rails   Number of Stairs 4  2 reps   Height of Stairs 6   Ramp 5: Supervision  prosthesis only   Ramp Details (indicate cue type and reason) grass slope with SBA / cues on posture and wt shift   Curb 5: Supervision  prosthesis only   Curb Details (indicate cue type and reason) worked on ascending & descending with either LE    Balance   Balance Assessed Yes   High Level Balance   High Level Balance Activities Negotiating over obstacles;Side stepping;Direction changes;Negotitating around obstacles;Head turns;Sudden stops  fast walking   High Level Balance Comments verbal  cues on technique   Therapeutic Activites    Therapeutic Activities Lifting;Work Field seismologistimulation   Lifting Verbal cues to bend knees more to lower center of gravity and protect back. Patient able to lift & carry from lower & upper handles on box with 15#, 35#, 55#, 70#.    Work Careers adviserimulation Climbing A-frame ladder and simulating 2 hand tasks with verbal cues. weight shifting for pushing /pulling activities; PT instructed in using shovel to dig with prosthesis, pt verbalized understanding   Prosthetics   Prosthetic Care Comments  Use of cut-off sock to increase fit distally where > shrinkage compared to knee area. Using other half of cut-off sock to wick sweat at top of liner. Pt verbalized understanding and reported greater comfort at knee area.   Current prosthetic wear tolerance (days/week)  daily   Current prosthetic wear tolerance (#hours/day)  reports all awake hours   Edema no pitting edema   Residual limb condition  wound healed.    Education Provided Residual limb care;Correct ply sock adjustment;Skin check   Person(s) Educated Patient   Education Method Explanation;Demonstration;Tactile cues;Verbal cues   Education Method Verbalized understanding;Verbal cues required;Tactile cues required;Needs further instruction                     PT Long Term Goals - 05/10/15 1015    PT LONG TERM GOAL #1   Title Patient verbalizes proper prosthetic care including donning, adjusting ply socks, residual limb management & skin integrity to enable safe use of prosthesis. (Target Date: 8th treatment)    Baseline Patient is dependent in all aspects of prosthetic care. He has redness on residual limb from improper wear / use for 14 days since delivery.   Status New   PT LONG TERM GOAL #2   Title Patient tolerates prosthesis wear >90% of awake hours with no skin issues or tenderness. (Target Date: 8th treatment)    Baseline Patient wears prosthesis >10hours daily but has redness on limb due to  improper advancement of wear.   Status New   PT LONG TERM GOAL #3   Title Patient ambulates >1000' including grass, ramps & curbs with prosthesis only independently. (Target Date: 8th treatment)    Baseline Patient ambulates 250' with prosthesis and cane with supervision.    Status New   PT LONG TERM GOAL #4   Title Berg Balance test >/= 52/56 to indicate lower fall risk   (Target Date: 8th treatment)    Baseline Berg Balance 42/56   Status New   PT LONG TERM GOAL #5   Title Functional Gait Assessment >/=19/30 to indicate lower fall risk with gait. (Target Date: 8th treatment)    Baseline Functional Gait Assessment 10/30   Status New   Additional Long Term Goals   Additional Long Term Goals  Yes   PT LONG TERM GOAL #6   Title Patient performs work related tasks including pushing / pulling like mopping, vacuum, lifitng & carrying up to 30#, climbing ladders with prosthesis independently. 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   Status New               Plan - 06/07/15 0930    Clinical Impression Statement Patient is on target to meet LTGs next week. He appears to understand work simulated tasks. He had increased comfort with use of cut-off socks.   Pt will benefit from skilled therapeutic intervention in order to improve on the following deficits Abnormal gait;Decreased activity tolerance;Decreased balance;Decreased endurance;Decreased mobility;Decreased skin integrity;Prosthetic Dependency   Rehab Potential Good   PT Frequency 2x / week   PT Duration 4 weeks   PT Treatment/Interventions ADLs/Self Care Home Management;DME Instruction;Gait training;Stair training;Functional mobility training;Therapeutic activities;Therapeutic exercise;Balance training;Neuromuscular re-education;Patient/family education;Prosthetic Training   PT Next Visit Plan Review prosthetic care,  prosthetic gait including barriers and grass, work simulation tasks   Consulted and Agree with Plan of Care Patient        Problem List Patient Active Problem List   Diagnosis Date Noted  . S/P BKA (below knee amputation) unilateral (HCC) 12/04/2014  . S/P transmetatarsal amputation of foot (HCC) 10/08/2014  . Gangrene of foot (HCC) 08/27/2014  . Foot osteomyelitis, left (HCC) 08/27/2014  . Toe amputation status (HCC) 07/01/2014  . Essential hypertension 06/23/2014  . Osteomyelitis of left foot (HCC) 06/08/2014  . Osteomyelitis of toe (HCC) 06/08/2014  . Diabetes mellitus type 2 with complications (HCC)   . Diabetic foot (HCC) 05/07/2014  . Osteomyelitis of right foot (HCC) 01/13/2014  . Leukocytosis 01/12/2014  . Gangrene associated with diabetes mellitus (HCC) 01/11/2014  . Osteomyelitis (HCC) 01/11/2014    Damare Serano PT, DPT 06/07/2015, 6:53 PM  [facility] [address] [place], [place], [address] Phone: [phone]   Fax:  [phone]  Name: FRANDY BASNETT MRN: [id number] Date of Birth: [birth year]

## 2015-06-10 ENCOUNTER — Ambulatory Visit: Payer: Medicaid Other | Admitting: Physical Therapy

## 2015-06-10 ENCOUNTER — Other Ambulatory Visit: Payer: Self-pay | Admitting: *Deleted

## 2015-06-10 ENCOUNTER — Encounter: Payer: Self-pay | Admitting: Physical Therapy

## 2015-06-10 ENCOUNTER — Telehealth: Payer: Self-pay | Admitting: Internal Medicine

## 2015-06-10 DIAGNOSIS — R269 Unspecified abnormalities of gait and mobility: Secondary | ICD-10-CM

## 2015-06-10 DIAGNOSIS — Z89512 Acquired absence of left leg below knee: Secondary | ICD-10-CM

## 2015-06-10 DIAGNOSIS — R2681 Unsteadiness on feet: Secondary | ICD-10-CM

## 2015-06-10 DIAGNOSIS — Z4789 Encounter for other orthopedic aftercare: Secondary | ICD-10-CM

## 2015-06-10 DIAGNOSIS — E119 Type 2 diabetes mellitus without complications: Secondary | ICD-10-CM

## 2015-06-10 DIAGNOSIS — R6889 Other general symptoms and signs: Secondary | ICD-10-CM

## 2015-06-10 DIAGNOSIS — R2689 Other abnormalities of gait and mobility: Secondary | ICD-10-CM

## 2015-06-10 MED ORDER — INSULIN DETEMIR 100 UNIT/ML ~~LOC~~ SOLN: 15.0000 [IU] | Freq: Every day | SUBCUTANEOUS | Status: DC

## 2015-06-10 NOTE — Telephone Encounter (Signed)
Patient has enough insulin until the 11th of December but his appointment with dr Hyman Hopesjegede is on the 15th and is concerned that he will run out. Please follow up with pt if we are able to provide him with a sample. Thank you.

## 2015-06-10 NOTE — Telephone Encounter (Signed)
Patients Insulin has been refilled with 3 additional refills.

## 2015-06-10 NOTE — Therapy (Signed)
[facility] [address] [place], [place], [address] Phone: [phone]   Fax:  [phone]  Physical Therapy Treatment  Patient Details  Name: Ryan [name] MRN: [id number] Date of Birth: February 15, 1961 Referring Provider: Aldean BakerMarcus Duda, MD  Encounter Date: 06/10/2015      PT End of Session - 06/10/15 1015    Visit Number 8   Number of Visits 9   Authorization Type Medicaid   Authorization Time Period 05/19/2015 - 07/13/2015   Authorization - Visit Number 7   Authorization - Number of Visits 8   PT Start Time 0930   PT Stop Time 1014   PT Time Calculation (min) 44 min   Equipment Utilized During Treatment Gait belt   Activity Tolerance Patient limited by pain   Behavior During Therapy [facility] for tasks assessed/performed      Past Medical History  Diagnosis Date  . Hypertension   . Osteomyelitis of foot (HCC)   . Heart murmur     was told "not to worry about it"  . Diabetes mellitus without complication (HCC)     type 2  . Diabetic neuropathy (HCC)   . Arthritis   . Anemia     low iron  . Peripheral vascular disease (HCC)     "poor circulation" in feet and necrosis    Past Surgical History  Procedure Laterality Date  . Amputation Right 01/13/2014    Procedure: Right great toe amputation;  Surgeon: Jacki Conesonald A Gioffre, MD;  Location: [facility];  Service: Orthopedics;  Laterality: Right;  . Knee surgery Right   . Arm surgery Left     due to broken arm  . Amputation Left 06/08/2014    Procedure: AMPUTATION LEFT GREAT TOE;  Surgeon: Verlee RossettiSteven R Norris, MD;  Location: [facility];  Service: Orthopedics;  Laterality: Left;  . Amputation Left 07/01/2014    Procedure: LEFT First Metatarsal RAY AMPUTATION ;  Surgeon: Verlee RossettiSteven R Norris, MD;  [facility];  Service: Orthopedics;  Laterality: Left;  . Amputation Left 08/28/2014    Procedure: Left Foot Fifth ray resection;  Surgeon: Kathryne Hitchhristopher Y Blackman, MD;  Location: [facility];   Service: Orthopedics;  Laterality: Left;  . Amputation Left 10/08/2014    Procedure: LEFT FOOT TRANSMETATARSAL AMPUTATION;  Surgeon: Kathryne Hitchhristopher Y Blackman, MD;  [facility];  Service: Orthopedics;  Laterality: Left;  . Amputation Left 12/04/2014    Procedure: AMPUTATION BELOW KNEE;  Surgeon: Nadara MustardMarcus Duda V, MD;  Location: [facility];  Service: Orthopedics;  Laterality: Left;    There were no vitals filed for this visit.  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 All of these changes have improved bunching / issues at back of knee.   Currently in Pain? (p) No/denies                         [facility] Adult PT Treatment/Exercise - 06/10/15 0930    Transfers   Transfers Floor to Transfer   Floor to Transfer 5: Supervision  pushing off floor to half kneel & no UE A from half kneel  Floor to Transfer Details (indicate cue type and reason) verbal cues on wt shift   Ambulation/Gait   Ambulation/Gait Yes   Ambulation/Gait Assistance 5: Supervision   Ambulation Distance (Feet) 1000 Feet  1000' outside & 400' inside   Assistive device Prosthesis   Gait Pattern Step-through pattern;Decreased stance time - left;Left flexed knee in stance   Stairs Yes   Stairs Assistance 5: Supervision   Stair Management Technique Forwards;Alternating pattern;No rails   Number of Stairs 4  2 reps   Height of Stairs 6   Ramp 5: Supervision  prosthesis only   Ramp Details (indicate cue type and reason) grass slope with cues on posture & wt shift   Curb 5: Supervision  prosthesis only   Curb Details (indicate cue type and reason) verbal cues leading with  either LE   Balance   Balance Assessed Yes   High Level Balance   High Level Balance Activities Negotiating over obstacles;Side stepping;Direction changes;Negotitating around obstacles;Head turns;Sudden stops;Turns;Weight-shifting turns  fast walking   High Level Balance Comments verbal cues on technique with demo   Therapeutic Activites    Therapeutic Activities Lifting;Work Field seismologist cues to bend knees more to lower center of gravity and protect back. Patient able to lift & carry from lower & upper handles on box with 15#, 35#, 55#, 70# & 80#.    Work Careers adviser, simulated carrying paint can in one hand and simulating 2 hand tasks with verbal cues. weight shifting for pushing /pulling activities; PT instructed in using shovel to dig with prosthesis, pt verbalized understanding   Prosthetics   Prosthetic Care Comments  Use of cut-off sock to increase fit distally where > shrinkage compared to knee area. Using other half of cut-off sock to wick sweat at top of liner. Pt verbalized understanding and reported greater comfort at knee area.   Current prosthetic wear tolerance (days/week)  daily   Current prosthetic wear tolerance (#hours/day)  reports all awake hours   Edema no pitting edema   Residual limb condition  wound healed.    Education Provided Residual limb care;Correct ply sock adjustment;Skin check   Person(s) Educated Patient   Education Method Explanation   Education Method Verbalized understanding                     PT Long Term Goals - 05/10/15 1015    PT LONG TERM GOAL #1   Title Patient verbalizes proper prosthetic care including donning, adjusting ply socks, residual limb management & skin integrity to enable safe use of prosthesis. (Target Date: 8th treatment)    Baseline Patient is dependent in all aspects of prosthetic care. He has redness on residual limb from improper wear / use for 14 days since delivery.    Status New   PT LONG TERM GOAL #2   Title Patient tolerates prosthesis wear >90% of awake hours with no skin issues or tenderness. (Target Date: 8th treatment)    Baseline Patient wears prosthesis >10hours daily but has redness on limb due to improper advancement of wear.   Status New   PT LONG TERM GOAL #3   Title Patient ambulates >1000' including grass, ramps & curbs with prosthesis only independently. (Target Date: 8th treatment)    Baseline Patient ambulates 250' with prosthesis and cane with supervision.    Status New   PT LONG TERM GOAL #4   Title Berg Balance test >/= 52/56 to indicate lower fall risk   (  Target Date: 8th treatment)    Baseline Berg Balance 42/56   Status New   PT LONG TERM GOAL #5   Title Functional Gait Assessment >/=19/30 to indicate lower fall risk with gait. (Target Date: 8th treatment)    Baseline Functional Gait Assessment 10/30   Status New   Additional Long Term Goals   Additional Long Term Goals Yes   PT LONG TERM GOAL #6   Title Patient performs work related tasks including pushing / pulling like mopping, vacuum, lifitng & carrying up to 30#, climbing ladders with prosthesis independently. 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   Status New               Plan - 06/10/15 1015    Clinical Impression Statement Patient appears on target to meet all LTGs next session with plans to discharge. Patient reports prosthesis is more comfortable with cut-off sock use and trimming back of liners. Patient appears to understand work simulation tasks.   Pt will benefit from skilled therapeutic intervention in order to improve on the following deficits Abnormal gait;Decreased activity tolerance;Decreased balance;Decreased endurance;Decreased mobility;Decreased skin integrity;Prosthetic Dependency   Rehab Potential Good   PT Frequency 2x / week    PT Duration 4 weeks   PT Treatment/Interventions ADLs/Self Care Home Management;DME Instruction;Gait training;Stair training;Functional mobility training;Therapeutic activities;Therapeutic exercise;Balance training;Neuromuscular re-education;Patient/family education;Prosthetic Training   PT Next Visit Plan Assess for discharge   Consulted and Agree with Plan of Care Patient        Problem List Patient Active Problem List   Diagnosis Date Noted  . S/P BKA (below knee amputation) unilateral (HCC) 12/04/2014  . S/P transmetatarsal amputation of foot (HCC) 10/08/2014  . Gangrene of foot (HCC) 08/27/2014  . Foot osteomyelitis, left (HCC) 08/27/2014  . Toe amputation status (HCC) 07/01/2014  . Essential hypertension 06/23/2014  . Osteomyelitis of left foot (HCC) 06/08/2014  . Osteomyelitis of toe (HCC) 06/08/2014  . Diabetes mellitus type 2 with complications (HCC)   . Diabetic foot (HCC) 05/07/2014  . Osteomyelitis of right foot (HCC) 01/13/2014  . Leukocytosis 01/12/2014  . Gangrene associated with diabetes mellitus (HCC) 01/11/2014  . Osteomyelitis (HCC) 01/11/2014    Brooklinn Longbottom PT, DPT 06/10/2015, 9:36 PM  [facility] [address] [place], [place], [address] Phone: [phone]   Fax:  [phone]  Name: Ryan [name] MRN: [id number] Date of Birth: October 12, 1960

## 2015-06-14 ENCOUNTER — Encounter: Payer: No Typology Code available for payment source | Admitting: Physical Therapy

## 2015-06-17 ENCOUNTER — Ambulatory Visit: Payer: Medicaid Other | Admitting: Physical Therapy

## 2015-06-17 ENCOUNTER — Encounter: Payer: Self-pay | Admitting: Physical Therapy

## 2015-06-17 DIAGNOSIS — R2689 Other abnormalities of gait and mobility: Secondary | ICD-10-CM

## 2015-06-17 DIAGNOSIS — R6889 Other general symptoms and signs: Secondary | ICD-10-CM

## 2015-06-17 DIAGNOSIS — R2681 Unsteadiness on feet: Secondary | ICD-10-CM

## 2015-06-17 DIAGNOSIS — R269 Unspecified abnormalities of gait and mobility: Secondary | ICD-10-CM

## 2015-06-17 DIAGNOSIS — Z89512 Acquired absence of left leg below knee: Secondary | ICD-10-CM

## 2015-06-17 DIAGNOSIS — Z4789 Encounter for other orthopedic aftercare: Secondary | ICD-10-CM

## 2015-06-17 NOTE — Therapy (Signed)
[facility] [address] [place], [place], [address] Phone: [phone]   Fax:  [phone]  Physical Therapy Treatment  Patient Details  Name: Ryan [name] MRN: [id number] Date of Birth: March 27, 1961 Referring Provider: Meridee Score, MD  PHYSICAL THERAPY DISCHARGE SUMMARY  Visits from Start of Care: 9  Current functional level related to goals / functional outcomes: See below   Remaining deficits: See below   Education / Equipment: Prosthetic care, lifting & work tasks techniques.  Plan: Patient agrees to discharge.  Patient goals were met. Patient is being discharged due to meeting the stated rehab goals.  ?????       Encounter Date: 06/17/2015      PT End of Session - 06/17/15 1015    Visit Number 9   Number of Visits 9   Authorization Type Medicaid   Authorization Time Period 05/19/2015 - 07/13/2015   Authorization - Visit Number 8   Authorization - Number of Visits 8   PT Start Time 0930   PT Stop Time 1010   PT Time Calculation (min) 40 min   Equipment Utilized During Treatment Gait belt   Activity Tolerance Patient limited by pain   Behavior During Therapy WFL for tasks assessed/performed      Past Medical History  Diagnosis Date  . Hypertension   . Osteomyelitis of foot ([place])   . Heart murmur     was told "not to worry about it"  . Diabetes mellitus without complication ([place])     type 2  . Diabetic neuropathy ([place])   . Arthritis   . Anemia     low iron  . Peripheral vascular disease ([place])     "poor circulation" in feet and necrosis    Past Surgical History  Procedure Laterality Date  . Amputation Right 01/13/2014    Procedure: Right great toe amputation;  Surgeon: Tobi Bastos, MD;  Location: [facility];  Service: Orthopedics;  Laterality: Right;  . Knee surgery Right   . Arm surgery Left     due to broken arm  . Amputation Left 06/08/2014    Procedure: AMPUTATION LEFT GREAT  TOE;  Surgeon: Augustin Schooling, MD;  Location: [facility];  Service: Orthopedics;  Laterality: Left;  . Amputation Left 07/01/2014    Procedure: LEFT First Metatarsal RAY AMPUTATION ;  Surgeon: Augustin Schooling, MD;  Location: [facility];  Service: Orthopedics;  Laterality: Left;  . Amputation Left 08/28/2014    Procedure: Left Foot Fifth ray resection;  Surgeon: Mcarthur Rossetti, MD;  Location: [facility];  Service: Orthopedics;  Laterality: Left;  . Amputation Left 10/08/2014    Procedure: LEFT FOOT TRANSMETATARSAL AMPUTATION;  Surgeon: Mcarthur Rossetti, MD;  Location: [facility];  Service: Orthopedics;  Laterality: Left;  . Amputation Left 12/04/2014    Procedure: AMPUTATION BELOW KNEE;  Surgeon: Newt Minion, MD;  Location: [facility];  Service: Orthopedics;  Laterality: Left;    There were no vitals filed for this visit.  Visit Diagnosis:  Unsteadiness  Abnormality of gait  Balance problems  Decreased functional activity tolerance  Status post below knee amputation of left lower extremity ([place])  Encounter for prosthetic gait training      Subjective Assessment - 06/17/15 0934    Subjective No falls or issues.    Currently in Pain? No/denies            [facility] PT Assessment - 06/17/15 0930    Assessment  Referring Provider Meridee Score, MD   Observation/Other Assessments   Focus on Therapeutic Outcomes (FOTO)  83 Functional Status  Initial was 37   Fear Avoidance Belief Questionnaire (FABQ)  19   Posture/Postural Control   Posture/Postural Control No significant limitations   Ambulation/Gait   Ambulation/Gait Yes   Ambulation/Gait Assistance 7: Independent   Ambulation Distance (Feet) 1200 Feet   Assistive device Prosthesis   Gait Pattern Within Functional Limits   Ambulation Surface Indoor;Level;Outdoor;Unlevel;Paved;Gravel;Grass  grass slope   Stairs Yes   Stairs Assistance 6: Modified independent (Device/Increase time)   Stair Management Technique No rails;Alternating  pattern;Forwards   Number of Stairs 4   Ramp 7: Independent  prosthesis only   Curb 7: Independent  prosthesis only, leading with either LE   Berg Balance Test   Sit to Stand Able to stand without using hands and stabilize independently   Standing Unsupported Able to stand safely 2 minutes   Sitting with Back Unsupported but Feet Supported on Floor or Stool Able to sit safely and securely 2 minutes   Stand to Sit Sits safely with minimal use of hands   Transfers Able to transfer safely, minor use of hands   Standing Unsupported with Eyes Closed Able to stand 10 seconds safely   Standing Ubsupported with Feet Together Able to place feet together independently and stand 1 minute safely   From Standing, Reach Forward with Outstretched Arm Can reach confidently >25 cm (10")   From Standing Position, Pick up Object from Floor Able to pick up shoe safely and easily   From Standing Position, Turn to Look Behind Over each Shoulder Looks behind from both sides and weight shifts well   Turn 360 Degrees Able to turn 360 degrees safely in 4 seconds or less   Standing Unsupported, Alternately Place Feet on Step/Stool Able to stand independently and safely and complete 8 steps in 20 seconds   Standing Unsupported, One Foot in Front Able to plae foot ahead of the other independently and hold 30 seconds   Standing on One Leg Able to lift leg independently and hold > 10 seconds   Total Score 55   Functional Gait  Assessment   Gait assessed  Yes   Gait Level Surface Walks 20 ft in less than 5.5 sec, no assistive devices, good speed, no evidence for imbalance, normal gait pattern, deviates no more than 6 in outside of the 12 in walkway width.   Change in Gait Speed Able to smoothly change walking speed without loss of balance or gait deviation. Deviate no more than 6 in outside of the 12 in walkway width.   Gait with Horizontal Head Turns Performs head turns smoothly with no change in gait. Deviates no more  than 6 in outside 12 in walkway width   Gait with Vertical Head Turns Performs head turns with no change in gait. Deviates no more than 6 in outside 12 in walkway width.   Gait and Pivot Turn Pivot turns safely within 3 sec and stops quickly with no loss of balance.   Step Over Obstacle Is able to step over 2 stacked shoe boxes taped together (9 in total height) without changing gait speed. No evidence of imbalance.   Gait with Narrow Base of Support Ambulates 4-7 steps.   Gait with Eyes Closed Walks 20 ft, uses assistive device, slower speed, mild gait deviations, deviates 6-10 in outside 12 in walkway width. Ambulates 20 ft in less than 9 sec but greater  than 7 sec.   Ambulating Backwards Walks 20 ft, uses assistive device, slower speed, mild gait deviations, deviates 6-10 in outside 12 in walkway width.   Steps Alternating feet, no rail.   Total Score 26         Prosthetics Assessment - 06/17/15 0930    Prosthetics   Prosthetic Care Independent with Skin check;Residual limb care;Care of non-amputated limb;Prosthetic cleaning;Ply sock cleaning;Correct ply sock adjustment;Proper wear schedule/adjustment;Proper weight-bearing schedule/adjustment   Donning prosthesis  Independent   Doffing prosthesis  Independent   Current prosthetic wear tolerance (days/week)  daily   Current prosthetic wear tolerance (#hours/day)  >90% of awake houirs   Edema no edema noted   Residual limb condition  No issues, normal color, temperature, hair growth   K code/activity level with prosthetic use  3                    [facility] Adult PT Treatment/Exercise - 06/17/15 0930    Therapeutic Activites    Therapeutic Activities Lifting;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                     PT Long Term Goals - 06/17/15 0930    PT LONG TERM GOAL #1   Title Patient  verbalizes proper prosthetic care including donning, adjusting ply socks, residual limb management & skin integrity to enable safe use of prosthesis. (Target Date: 8th treatment)    Baseline MET 06/17/2015   Status Achieved   PT LONG TERM GOAL #2   Title Patient tolerates prosthesis wear >90% of awake hours with no skin issues or tenderness. (Target Date: 8th treatment)    Baseline MET 06/17/2015   Status Achieved   PT LONG TERM GOAL #3   Title Patient ambulates >1000' including grass, ramps & curbs with prosthesis only independently. (Target Date: 8th treatment)    Baseline MET 06/07/2015   Status Achieved   PT LONG TERM GOAL #4   Title Berg Balance test >/= 52/56 to indicate lower fall risk   (Target Date: 8th treatment)    Baseline MET 06/17/2015  Berg Balance 55/56   Status Achieved   PT LONG TERM GOAL #5   Title Functional Gait Assessment >/=19/30 to indicate lower fall risk with gait. (Target Date: 8th treatment)    Baseline MET 06/17/2015  FGA 26/30   Status Achieved   PT LONG TERM GOAL #6   Title Patient performs work related tasks including pushing / pulling like mopping, vacuum, lifitng & carrying up to 30#, climbing ladders with prosthesis independently. (Target Date: 8th treatment)    Baseline MET 06/17/2015   Status Achieved               Plan - 06/17/15 1015    Clinical Impression Statement Patient met all LTGs. He self rates his functional status at 83% from initial of 37%. He appears to be functioning at full community level with variable cadence without issues.    Pt will benefit from skilled therapeutic intervention in order to improve on the following deficits Abnormal gait;Decreased activity tolerance;Decreased balance;Decreased endurance;Decreased mobility;Decreased skin integrity;Prosthetic Dependency   Rehab Potential Good   PT Frequency 2x / week   PT Duration 4 weeks   PT Treatment/Interventions ADLs/Self Care Home Management;DME Instruction;Gait  training;Stair training;Functional mobility training;Therapeutic activities;Therapeutic exercise;Balance training;Neuromuscular re-education;Patient/family education;Prosthetic Training   PT Next Visit  Plan discharge   Consulted and Agree with Plan of Care Patient        Problem List Patient Active Problem List   Diagnosis Date Noted  . S/P BKA (below knee amputation) unilateral ([place]) 12/04/2014  . S/P transmetatarsal amputation of foot ([place]) 10/08/2014  . Gangrene of foot ([place]) 08/27/2014  . Foot osteomyelitis, left ([place]) 08/27/2014  . Toe amputation status ([place]) 07/01/2014  . Essential hypertension 06/23/2014  . Osteomyelitis of left foot ([place]) 06/08/2014  . Osteomyelitis of toe ([place]) 06/08/2014  . Diabetes mellitus type 2 with complications ([place])   . Diabetic foot ([place]) 05/07/2014  . Osteomyelitis of right foot ([place]) 01/13/2014  . Leukocytosis 01/12/2014  . Gangrene associated with diabetes mellitus ([place]) 01/11/2014  . Osteomyelitis ([place]) 01/11/2014    Kassidie Hendriks PT, DPT 06/17/2015, 12:48 PM  [facility] [address] [place], [place], [address] Phone: [phone]   Fax:  [phone]  Name: Ryan [name] MRN: [id number] Date of Birth: [birth year]

## 2015-06-21 ENCOUNTER — Encounter: Payer: No Typology Code available for payment source | Admitting: Physical Therapy

## 2015-06-23 ENCOUNTER — Encounter: Payer: No Typology Code available for payment source | Admitting: Physical Therapy

## 2015-06-28 ENCOUNTER — Encounter: Payer: No Typology Code available for payment source | Admitting: Physical Therapy

## 2015-07-01 ENCOUNTER — Encounter: Payer: No Typology Code available for payment source | Admitting: Physical Therapy

## 2015-07-05 ENCOUNTER — Encounter: Payer: No Typology Code available for payment source | Admitting: Physical Therapy

## 2015-07-08 ENCOUNTER — Encounter: Payer: No Typology Code available for payment source | Admitting: Physical Therapy

## 2015-07-14 ENCOUNTER — Other Ambulatory Visit: Payer: Self-pay | Admitting: Internal Medicine

## 2015-07-15 ENCOUNTER — Ambulatory Visit: Payer: Medicaid Other | Attending: Internal Medicine | Admitting: Internal Medicine

## 2015-07-15 ENCOUNTER — Encounter: Payer: Self-pay | Admitting: Internal Medicine

## 2015-07-15 VITALS — BP 122/75 | HR 92 | Temp 97.6°F | Resp 18 | Ht 71.0 in | Wt 168.8 lb

## 2015-07-15 DIAGNOSIS — Z888 Allergy status to other drugs, medicaments and biological substances status: Secondary | ICD-10-CM | POA: Insufficient documentation

## 2015-07-15 DIAGNOSIS — E1140 Type 2 diabetes mellitus with diabetic neuropathy, unspecified: Secondary | ICD-10-CM | POA: Insufficient documentation

## 2015-07-15 DIAGNOSIS — E784 Other hyperlipidemia: Secondary | ICD-10-CM

## 2015-07-15 DIAGNOSIS — Z794 Long term (current) use of insulin: Secondary | ICD-10-CM | POA: Diagnosis not present

## 2015-07-15 DIAGNOSIS — E785 Hyperlipidemia, unspecified: Secondary | ICD-10-CM

## 2015-07-15 DIAGNOSIS — Z89412 Acquired absence of left great toe: Secondary | ICD-10-CM | POA: Insufficient documentation

## 2015-07-15 DIAGNOSIS — Z89519 Acquired absence of unspecified leg below knee: Secondary | ICD-10-CM | POA: Insufficient documentation

## 2015-07-15 DIAGNOSIS — I739 Peripheral vascular disease, unspecified: Secondary | ICD-10-CM | POA: Diagnosis not present

## 2015-07-15 DIAGNOSIS — I10 Essential (primary) hypertension: Secondary | ICD-10-CM | POA: Diagnosis not present

## 2015-07-15 DIAGNOSIS — E118 Type 2 diabetes mellitus with unspecified complications: Secondary | ICD-10-CM

## 2015-07-15 LAB — GLUCOSE, POCT (MANUAL RESULT ENTRY): POC GLUCOSE: 131 mg/dL — AB (ref 70–99)

## 2015-07-15 LAB — POCT GLYCOSYLATED HEMOGLOBIN (HGB A1C): HEMOGLOBIN A1C: 6.1

## 2015-07-15 MED ORDER — INSULIN DETEMIR 100 UNIT/ML FLEXPEN
5.0000 [IU] | PEN_INJECTOR | Freq: Every day | SUBCUTANEOUS | Status: DC
Start: 2015-07-15 — End: 2015-10-14

## 2015-07-15 MED ORDER — LISINOPRIL 20 MG PO TABS: 20.0000 mg | ORAL_TABLET | Freq: Every day | ORAL | Status: DC

## 2015-07-15 MED ORDER — ATORVASTATIN CALCIUM 40 MG PO TABS: 40.0000 mg | ORAL_TABLET | Freq: Every day | ORAL | Status: DC

## 2015-07-15 NOTE — Patient Instructions (Signed)
DASH Eating Plan DASH stands for "Dietary Approaches to Stop Hypertension." The DASH eating plan is a healthy eating plan that has been shown to reduce high blood pressure (hypertension). Additional health benefits may include reducing the risk of type 2 diabetes mellitus, heart disease, and stroke. The DASH eating plan may also help with weight loss. WHAT DO I NEED TO KNOW ABOUT THE DASH EATING PLAN? For the DASH eating plan, you will follow these general guidelines:  Choose foods with a percent daily value for sodium of less than 5% (as listed on the food label).  Use salt-free seasonings or herbs instead of table salt or sea salt.  Check with your health care provider or pharmacist before using salt substitutes.  Eat lower-sodium products, often labeled as "lower sodium" or "no salt added."  Eat fresh foods.  Eat more vegetables, fruits, and low-fat dairy products.  Choose whole grains. Look for the word "whole" as the first word in the ingredient list.  Choose fish and skinless chicken or turkey more often than red meat. Limit fish, poultry, and meat to 6 oz (170 g) each day.  Limit sweets, desserts, sugars, and sugary drinks.  Choose heart-healthy fats.  Limit cheese to 1 oz (28 g) per day.  Eat more home-cooked food and less restaurant, buffet, and fast food.  Limit fried foods.  Cook foods using methods other than frying.  Limit canned vegetables. If you do use them, rinse them well to decrease the sodium.  When eating at a restaurant, ask that your food be prepared with less salt, or no salt if possible. WHAT FOODS CAN I EAT? Seek help from a dietitian for individual calorie needs. Grains Whole grain or whole wheat bread. Brown rice. Whole grain or whole wheat pasta. Quinoa, bulgur, and whole grain cereals. Low-sodium cereals. Corn or whole wheat flour tortillas. Whole grain cornbread. Whole grain crackers. Low-sodium crackers. Vegetables Fresh or frozen vegetables  (raw, steamed, roasted, or grilled). Low-sodium or reduced-sodium tomato and vegetable juices. Low-sodium or reduced-sodium tomato sauce and paste. Low-sodium or reduced-sodium canned vegetables.  Fruits All fresh, canned (in natural juice), or frozen fruits. Meat and Other Protein Products Ground beef (85% or leaner), grass-fed beef, or beef trimmed of fat. Skinless chicken or turkey. Ground chicken or turkey. Pork trimmed of fat. All fish and seafood. Eggs. Dried beans, peas, or lentils. Unsalted nuts and seeds. Unsalted canned beans. Dairy Low-fat dairy products, such as skim or 1% milk, 2% or reduced-fat cheeses, low-fat ricotta or cottage cheese, or plain low-fat yogurt. Low-sodium or reduced-sodium cheeses. Fats and Oils Tub margarines without trans fats. Light or reduced-fat mayonnaise and salad dressings (reduced sodium). Avocado. Safflower, olive, or canola oils. Natural peanut or almond butter. Other Unsalted popcorn and pretzels. The items listed above may not be a complete list of recommended foods or beverages. Contact your dietitian for more options. WHAT FOODS ARE NOT RECOMMENDED? Grains White bread. White pasta. White rice. Refined cornbread. Bagels and croissants. Crackers that contain trans fat. Vegetables Creamed or fried vegetables. Vegetables in a cheese sauce. Regular canned vegetables. Regular canned tomato sauce and paste. Regular tomato and vegetable juices. Fruits Dried fruits. Canned fruit in light or heavy syrup. Fruit juice. Meat and Other Protein Products Fatty cuts of meat. Ribs, chicken wings, bacon, sausage, bologna, salami, chitterlings, fatback, hot dogs, bratwurst, and packaged luncheon meats. Salted nuts and seeds. Canned beans with salt. Dairy Whole or 2% milk, cream, half-and-half, and cream cheese. Whole-fat or sweetened yogurt. Full-fat   cheeses or blue cheese. Nondairy creamers and whipped toppings. Processed cheese, cheese spreads, or cheese  curds. Condiments Onion and garlic salt, seasoned salt, table salt, and sea salt. Canned and packaged gravies. Worcestershire sauce. Tartar sauce. Barbecue sauce. Teriyaki sauce. Soy sauce, including reduced sodium. Steak sauce. Fish sauce. Oyster sauce. Cocktail sauce. Horseradish. Ketchup and mustard. Meat flavorings and tenderizers. Bouillon cubes. Hot sauce. Tabasco sauce. Marinades. Taco seasonings. Relishes. Fats and Oils Butter, stick margarine, lard, shortening, ghee, and bacon fat. Coconut, palm kernel, or palm oils. Regular salad dressings. Other Pickles and olives. Salted popcorn and pretzels. The items listed above may not be a complete list of foods and beverages to avoid. Contact your dietitian for more information. WHERE CAN I FIND MORE INFORMATION? National Heart, Lung, and Blood Institute: www.nhlbi.nih.gov/health/health-topics/topics/dash/   This information is not intended to replace advice given to you by your health care provider. Make sure you discuss any questions you have with your health care provider.   Document Released: 07/06/2011 Document Revised: 08/07/2014 Document Reviewed: 05/21/2013 Elsevier Interactive Patient Education 2016 Elsevier Inc. Hypertension Hypertension, commonly called high blood pressure, is when the force of blood pumping through your arteries is too strong. Your arteries are the blood vessels that carry blood from your heart throughout your body. A blood pressure reading consists of a higher number over a lower number, such as 110/72. The higher number (systolic) is the pressure inside your arteries when your heart pumps. The lower number (diastolic) is the pressure inside your arteries when your heart relaxes. Ideally you want your blood pressure below 120/80. Hypertension forces your heart to work harder to pump blood. Your arteries may become narrow or stiff. Having untreated or uncontrolled hypertension can cause heart attack, stroke, kidney  disease, and other problems. RISK FACTORS Some risk factors for high blood pressure are controllable. Others are not.  Risk factors you cannot control include:   Race. You may be at higher risk if you are African American.  Age. Risk increases with age.  Gender. Men are at higher risk than women before age 45 years. After age 65, women are at higher risk than men. Risk factors you can control include:  Not getting enough exercise or physical activity.  Being overweight.  Getting too much fat, sugar, calories, or salt in your diet.  Drinking too much alcohol. SIGNS AND SYMPTOMS Hypertension does not usually cause signs or symptoms. Extremely high blood pressure (hypertensive crisis) may cause headache, anxiety, shortness of breath, and nosebleed. DIAGNOSIS To check if you have hypertension, your health care provider will measure your blood pressure while you are seated, with your arm held at the level of your heart. It should be measured at least twice using the same arm. Certain conditions can cause a difference in blood pressure between your right and left arms. A blood pressure reading that is higher than normal on one occasion does not mean that you need treatment. If it is not clear whether you have high blood pressure, you may be asked to return on a different day to have your blood pressure checked again. Or, you may be asked to monitor your blood pressure at home for 1 or more weeks. TREATMENT Treating high blood pressure includes making lifestyle changes and possibly taking medicine. Living a healthy lifestyle can help lower high blood pressure. You may need to change some of your habits. Lifestyle changes may include:  Following the DASH diet. This diet is high in fruits, vegetables, and whole grains.   It is low in salt, red meat, and added sugars.  Keep your sodium intake below 2,300 mg per day.  Getting at least 30-45 minutes of aerobic exercise at least 4 times per  week.  Losing weight if necessary.  Not smoking.  Limiting alcoholic beverages.  Learning ways to reduce stress. Your health care provider may prescribe medicine if lifestyle changes are not enough to get your blood pressure under control, and if one of the following is true:  You are 42-22 years of age and your systolic blood pressure is above 140.  You are 57 years of age or older, and your systolic blood pressure is above 150.  Your diastolic blood pressure is above 90.  You have diabetes, and your systolic blood pressure is over [id number] or your diastolic blood pressure is over 90.  You have kidney disease and your blood pressure is above 140/90.  You have heart disease and your blood pressure is above 140/90. Your personal target blood pressure may vary depending on your medical conditions, your age, and other factors. HOME CARE INSTRUCTIONS  Have your blood pressure rechecked as directed by your health care provider.   Take medicines only as directed by your health care provider. Follow the directions carefully. Blood pressure medicines must be taken as prescribed. The medicine does not work as well when you skip doses. Skipping doses also puts you at risk for problems.  Do not smoke.   Monitor your blood pressure at home as directed by your health care provider. SEEK MEDICAL CARE IF:   You think you are having a reaction to medicines taken.  You have recurrent headaches or feel dizzy.  You have swelling in your ankles.  You have trouble with your vision. SEEK IMMEDIATE MEDICAL CARE IF:  You develop a severe headache or confusion.  You have unusual weakness, numbness, or feel faint.  You have severe chest or abdominal pain.  You vomit repeatedly.  You have trouble breathing. MAKE SURE YOU:   Understand these instructions.  Will watch your condition.  Will get help right away if you are not doing well or get worse.   This information is not intended to  replace advice given to you by your health care provider. Make sure you discuss any questions you have with your health care provider.   Document Released: 07/17/2005 Document Revised: 12/01/2014 Document Reviewed: 05/09/2013 Elsevier Interactive Patient Education 2016 [place] Carbohydrate Counting for Diabetes Mellitus Carbohydrate counting is a method for keeping track of the amount of carbohydrates you eat. Eating carbohydrates naturally increases the level of sugar (glucose) in your blood, so it is important for you to know the amount that is okay for you to have in every meal. Carbohydrate counting helps keep the level of glucose in your blood within normal limits. The amount of carbohydrates allowed is different for every person. A dietitian can help you calculate the amount that is right for you. Once you know the amount of carbohydrates you can have, you can count the carbohydrates in the foods you want to eat. Carbohydrates are found in the following foods:  Grains, such as breads and cereals.  Dried beans and soy products.  Starchy vegetables, such as potatoes, peas, and corn.  Fruit and fruit juices.  Milk and yogurt.  Sweets and snack foods, such as cake, cookies, candy, chips, soft drinks, and fruit drinks. CARBOHYDRATE COUNTING There are two ways to count the carbohydrates in your food. You can use either  of the methods or a combination of both. Reading the "Nutrition Facts" on [place] The "Nutrition Facts" is an area that is included on the labels of almost all packaged food and beverages in the [place]. It includes the serving size of that food or beverage and information about the nutrients in each serving of the food, including the grams (g) of carbohydrate per serving.  Decide the number of servings of this food or beverage that you will be able to eat or drink. Multiply that number of servings by the number of grams of carbohydrate that is listed on  the label for that serving. The total will be the amount of carbohydrates you will be having when you eat or drink this food or beverage. Learning Standard Serving Sizes of Food When you eat food that is not packaged or does not include "Nutrition Facts" on the label, you need to measure the servings in order to count the amount of carbohydrates.A serving of most carbohydrate-rich foods contains about 15 g of carbohydrates. The following list includes serving sizes of carbohydrate-rich foods that provide 15 g ofcarbohydrate per serving:   1 slice of bread (1 oz) or 1 six-inch tortilla.    of a hamburger bun or English muffin.  4-6 crackers.   cup unsweetened dry cereal.    cup hot cereal.   cup rice or pasta.    cup mashed potatoes or  of a large baked potato.  1 cup fresh fruit or one small piece of fruit.    cup canned or frozen fruit or fruit juice.  1 cup milk.   cup plain fat-free yogurt or yogurt sweetened with artificial sweeteners.   cup cooked dried beans or starchy vegetable, such as peas, corn, or potatoes.  Decide the number of standard-size servings that you will eat. Multiply that number of servings by 15 (the grams of carbohydrates in that serving). For example, if you eat 2 cups of strawberries, you will have eaten 2 servings and 30 g of carbohydrates (2 servings x 15 g = 30 g). For foods such as soups and casseroles, in which more than one food is mixed in, you will need to count the carbohydrates in each food that is included. EXAMPLE OF CARBOHYDRATE COUNTING Sample Dinner  3 oz chicken breast.   cup of brown rice.   cup of corn.  1 cup milk.   1 cup strawberries with sugar-free whipped topping.  Carbohydrate Calculation Step 1: Identify the foods that contain carbohydrates:   Rice.   Corn.   Milk.   Strawberries. Step 2:Calculate the number of servings eaten of each:   2 servings of rice.   1 serving of corn.   1  serving of milk.   1 serving of strawberries. Step 3: Multiply each of those number of servings by 15 g:   2 servings of rice x 15 g = 30 g.   1 serving of corn x 15 g = 15 g.   1 serving of milk x 15 g = 15 g.   1 serving of strawberries x 15 g = 15 g. Step 4: Add together all of the amounts to find the total grams of carbohydrates eaten: 30 g + 15 g + 15 g + 15 g = 75 g.   This information is not intended to replace advice given to you by your health care provider. Make sure you discuss any questions you have with your health care provider.  Document Released: 07/17/2005 Document Revised: 08/07/2014 Document Reviewed: 06/13/2013 Elsevier Interactive Patient Education [employer].

## 2015-07-15 NOTE — Progress Notes (Signed)
Patient ID: Ryan [name], male   DOB: 07-27-1961, 54 y.o.   MRN: [id number]   Ryan [name], is a 54 y.o. male  [id number]  [id number]  DOB - [birth year]  Chief Complaint  Patient presents with  . Follow-up        Subjective:   Ryan [name] is a 54 y.o. 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 Patient is doing very well, blood sugar is down to 90 or below fasting every day, he is compliant with medications and insulin injection. Patient recently joined the amputee support group, he is getting great support and help from this group. He has no new complaint today. He started riding a bicycle about 5 miles per day, he has new prosthesis on his left leg, gradually adjusting to its use. Blood sugar is controlled. Blood pressure also controlled. Patient has No headache, No chest pain, No abdominal pain - No Nausea, No new weakness tingling or numbness, No Cough - SOB. He does not drink alcohol, does not smoke cigarettes.  No problems updated.  ALLERGIES: Allergies  Allergen Reactions  . Metformin And Related Other (See Comments)    Pt states that metformin made sugars fall into 30's    PAST MEDICAL HISTORY: Past Medical History  Diagnosis Date  . Hypertension   . Osteomyelitis of foot ([place])   . Heart murmur     was told "not to worry about it"  . Diabetes mellitus without complication ([place])     type 2  . Diabetic neuropathy ([place])   . Arthritis   . Anemia     low iron  . Peripheral vascular disease ([place])     "poor circulation" in feet and necrosis    MEDICATIONS AT HOME: Prior to Admission medications   Medication Sig Start Date End Date Taking? Authorizing Provider  Blood Glucose Monitoring Suppl (ACCU-CHEK AVIVA PLUS) W/DEVICE KIT 1 each by Does not apply route 2 (two) times daily.  05/06/15  Yes Muaad Boehning Essie Christine, MD  cephALEXin (KEFLEX) 500 MG capsule Take 500 mg by mouth 3 (three) times daily. 7 day course started 12/03/14 pm   Yes Historical Provider, MD  glucose blood (ACCU-CHEK AVIVA PLUS) test strip Use as instructed 05/06/15  Yes Tresa Garter, MD  ibuprofen (ADVIL,MOTRIN) 200 MG tablet Take 400 mg by mouth every 6 (six) hours as needed for fever (pain).   Yes Historical Provider, MD  Lancet Devices [facility]) lancets Use as instructed 05/06/15  Yes Tresa Garter, MD  lisinopril (PRINIVIL,ZESTRIL) 20 MG tablet Take 1 tablet (20 mg total) by mouth daily. 07/15/15  Yes Tresa Garter, MD  oxyCODONE-acetaminophen (PERCOCET/ROXICET) 5-325 MG per tablet Take 1-2 tablets by mouth every 4 (four) hours as needed for severe pain.   Yes Historical Provider, MD  Probiotic Product (PROBIOTIC PO) Take 1 tablet by mouth daily after lunch.   Yes Historical Provider, MD  atorvastatin (LIPITOR) 40 MG tablet Take 1 tablet (40 mg total) by mouth daily. 07/15/15   Tresa Garter, MD  Insulin Detemir (LEVEMIR) 100 UNIT/ML Pen Inject 5 Units into the skin daily at 10 pm. 07/15/15   Tresa Garter, MD  oxyCODONE (OXY IR/ROXICODONE) 5 MG immediate release tablet Take 1-3 tablets (5-15 mg total) by mouth every 4 (four) hours as needed. Patient not taking:  Reported on 05/10/2015 12/05/14   Leandrew Koyanagi, MD  oxyCODONE (OXY IR/ROXICODONE) 5 MG immediate release tablet Take 1-2 tablets (5-10 mg total) by mouth every 3 (three) hours as needed for moderate pain or breakthrough pain. Patient not taking: Reported on 05/10/2015 12/08/14   Newt Minion, MD     Objective:   Filed Vitals:   07/15/15 0947  BP: 122/75  Pulse: 92  Temp: 97.6 F (36.4 C)  TempSrc: Oral  Resp: 18  Height: 5' 11"  (1.803 m)  Weight: 168 lb 12.8 oz (76.567 kg)  SpO2: 100%    Exam General appearance : Awake, alert, not in any distress. Speech Clear. Not toxic looking HEENT:  Atraumatic and Normocephalic, pupils equally reactive to light and accomodation Neck: supple, no JVD. No cervical lymphadenopathy.  Chest:Good air entry bilaterally, no added sounds  CVS: S1 S2 regular, no murmurs.  Abdomen: Bowel sounds present, Non tender and not distended with no gaurding, rigidity or rebound. Extremities: BKA with prosthesis on the left.  Stump examined, no open wound Neurology: Awake alert, and oriented X 3, CN II-XII intact, Non focal   Data Review Lab Results  Component Value Date   HGBA1C 6.10 07/15/2015   HGBA1C 6.80 02/25/2015   HGBA1C 8.10 11/02/2014     Assessment & Plan   1. Type 2 diabetes mellitus with complication, with long-term current use of insulin (HCC)  - POCT A1C is down to 6.1% - Glucose (CBG) - Microalbumin/Creatinine Ratio, Urine Reduce the dose of insulin 5 units daily at bedtime - Insulin Detemir (LEVEMIR) 100 UNIT/ML Pen; Inject 5 Units into the skin daily at 10 pm.  Dispense: 15 mL; Refill: 11  Aim for 30 minutes of exercise most days. Rethink what you drink. Water is great! Aim for 2-3 Carb Choices per meal (30-45 grams) +/- 1 either way  Aim for 0-15 Carbs per snack if hungry  Include protein in moderation with your meals and snacks  Consider reading food labels for Total Carbohydrate and Fat Grams of foods  Consider checking BG at alternate times per day  Continue taking medication as directed Be mindful about how much sugar you are adding to beverages and other foods. Fruit Punch - find one with no sugar  Measure and decrease portions of carbohydrate foods  Make your plate and don't go back for seconds   2. Essential hypertension  - lisinopril (PRINIVIL,ZESTRIL) 20 MG tablet; Take 1 tablet (20 mg total) by mouth daily.  Dispense: 90 tablet; Refill: 3  We have discussed target BP range and blood pressure goal. I have advised patient to check BP regularly and to call us back or report to clinic if the numbers are  consistently higher than 140/90. We discussed the importance of compliance with medical therapy and DASH diet recommended, consequences of uncontrolled hypertension discussed.   - continue current BP medications  3. Diabetic foot ([place]): Status post BKA right leg  Continue to attend amputee support group meetings Continue physical therapy  4. Dyslipidemia (high LDL; low HDL)  - atorvastatin (LIPITOR) 40 MG tablet; Take 1 tablet (40 mg total) by mouth daily.  Dispense: 90 tablet; Refill: 3 To address this please limit saturated fat to no more than 7% of your calories, limit cholesterol to 200 mg/day, increase fiber and exercise as tolerated. If needed we may add another cholesterol lowering medication to your regimen.   Patient have been counseled extensively about nutrition and exercise  Return in about 3 months (around  10/13/2015) for Hemoglobin A1C and Follow up, DM, Follow up HTN, Follow up Pain and comorbidities.  The patient was given clear instructions to go to ER or return to medical center if symptoms don't improve, worsen or new problems develop. The patient verbalized understanding. The patient was told to call to get lab results if they haven't heard anything in the next week.   This note has been created with Surveyor, quantity. Any transcriptional errors are unintentional.    Angelica Chessman, MD, [place], Karilyn Cota, [facility] [place], [place]   07/15/2015, 10:44 AM

## 2015-07-15 NOTE — Progress Notes (Signed)
Patient here for FU DM  Patient complains of phantom pain in left lower leg.  Patient needs refills on Lisinopril, Insulin. Patient would like pen if medication is available.

## 2015-07-16 LAB — MICROALBUMIN / CREATININE URINE RATIO
Creatinine, Urine: 71 mg/dL (ref 20–370)
Microalb Creat Ratio: 37 ug/mg{creat} — ABNORMAL HIGH
Microalb, Ur: 2.6 mg/dL

## 2015-07-20 ENCOUNTER — Other Ambulatory Visit: Payer: Self-pay | Admitting: Internal Medicine

## 2015-07-27 IMAGING — CR DG FOOT COMPLETE 3+V*R*
3 series · 3 of 3 positions shown · non-contrast
Comparison: Ankle films of the same day.

CLINICAL DATA: Infection.

EXAM:
RIGHT FOOT COMPLETE - 3+ VIEW

[x foot ap right]
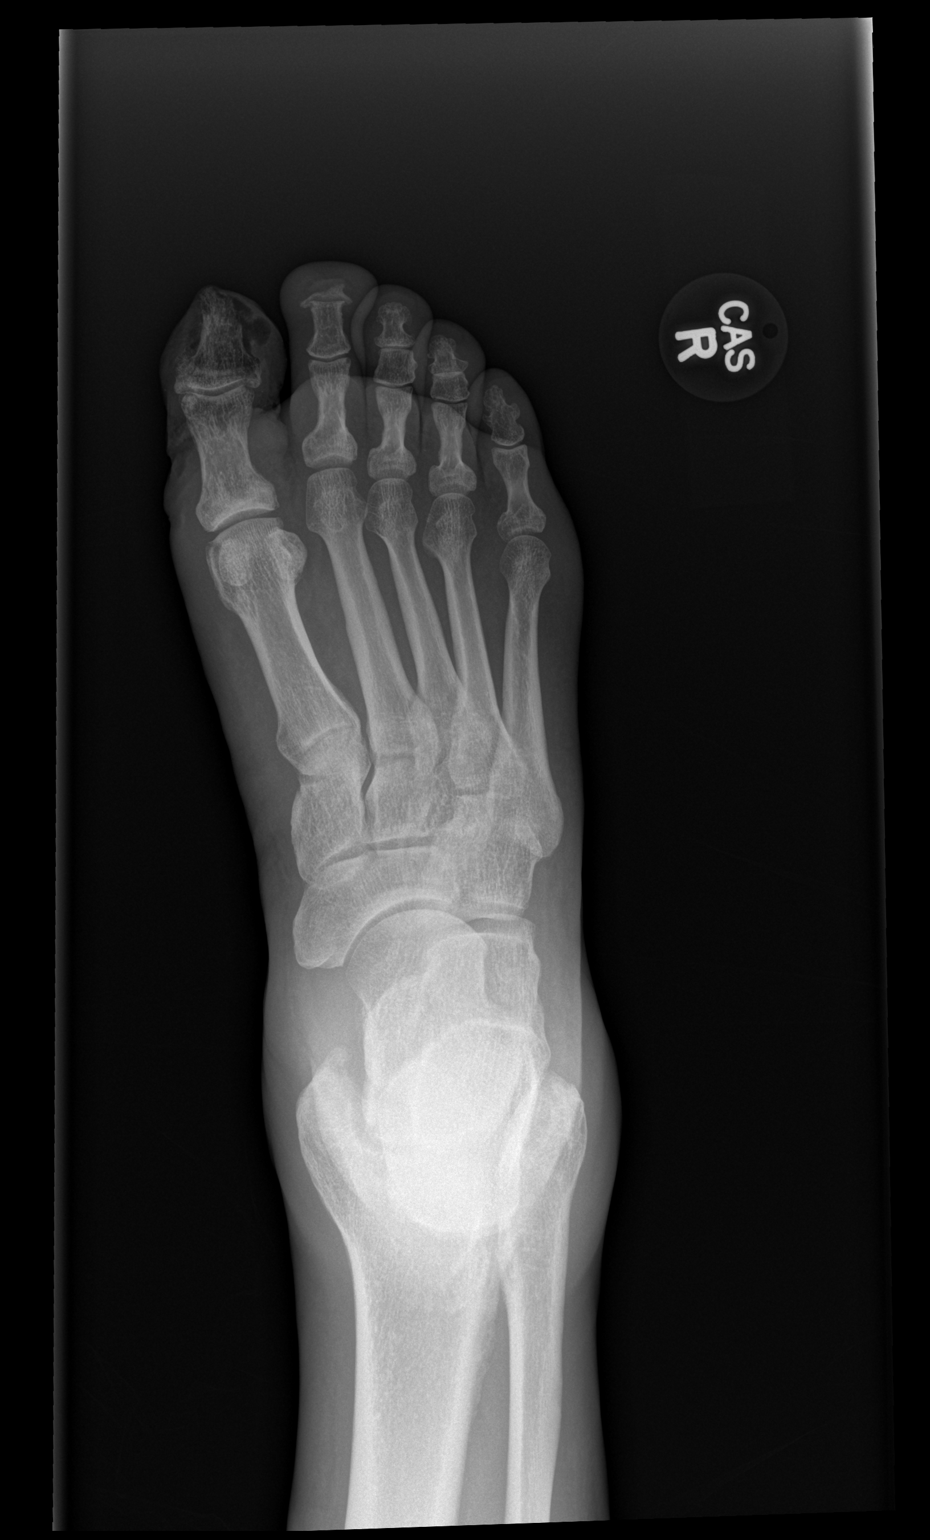

[x foot obl right]
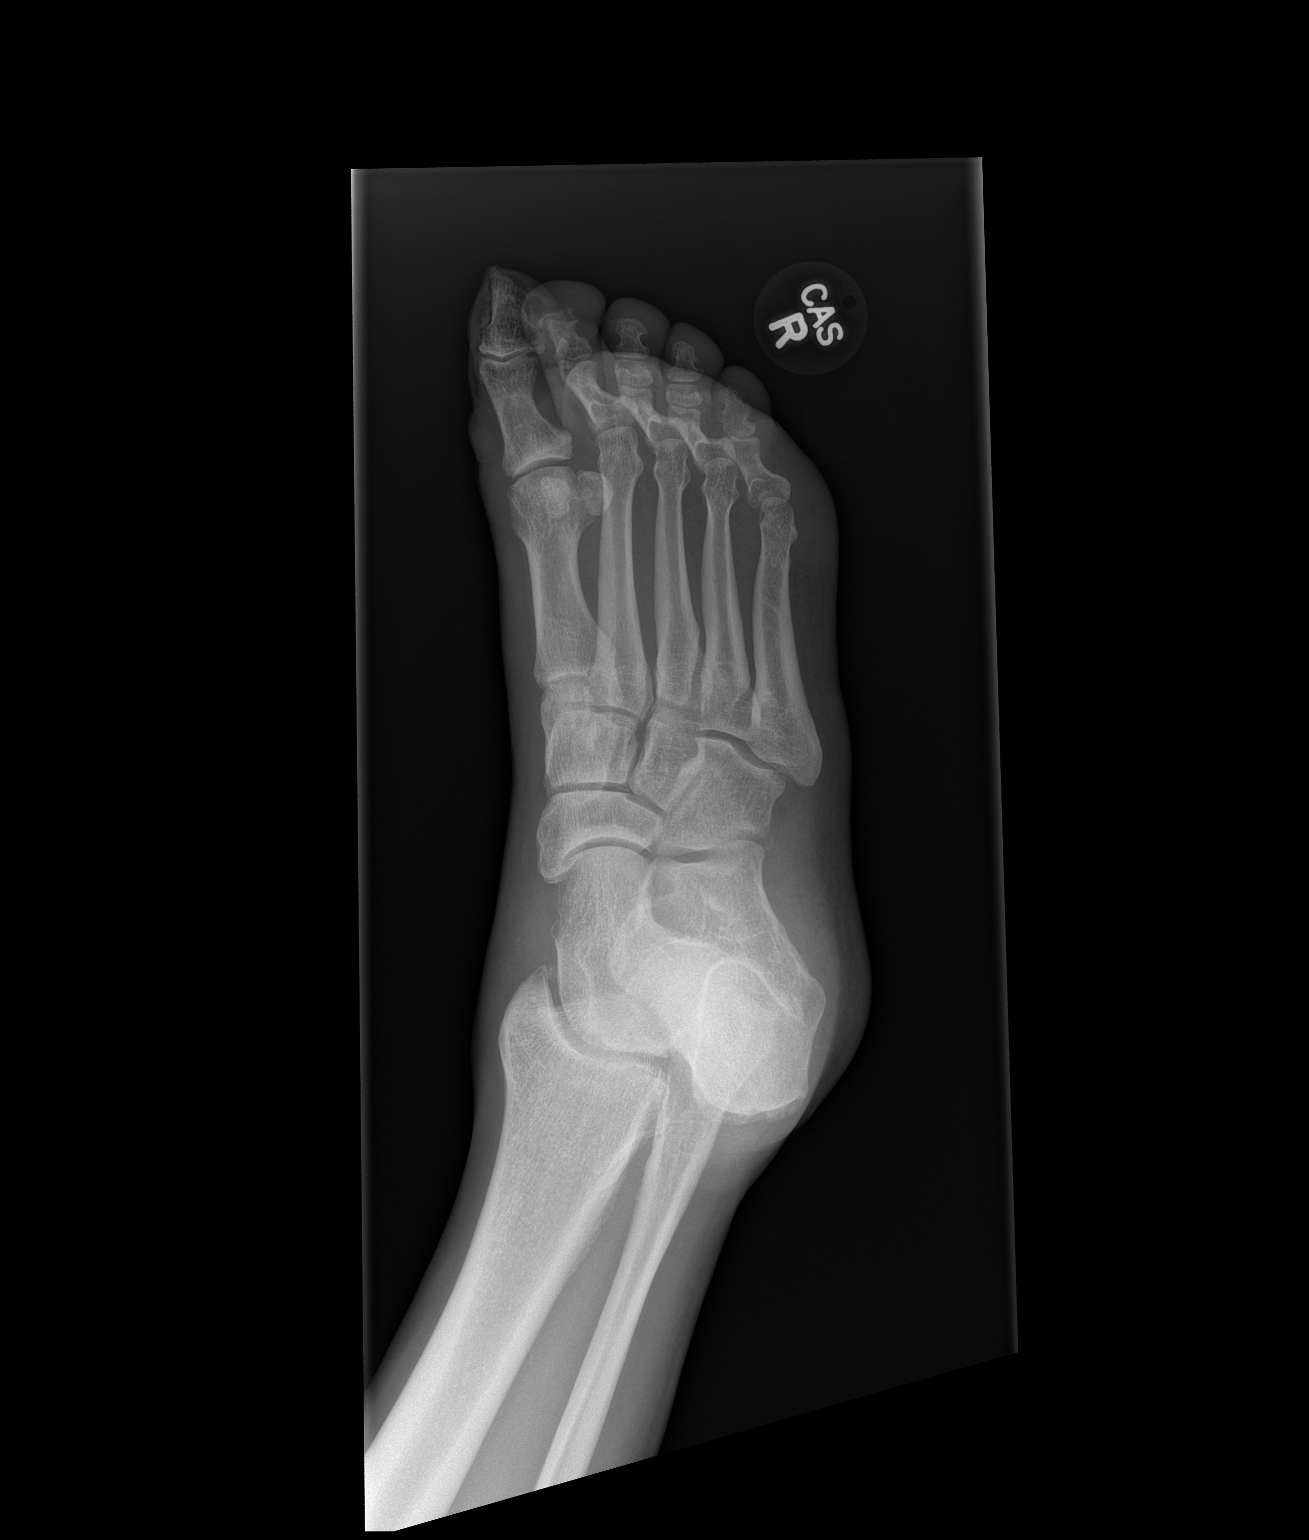

[x foot lat right]
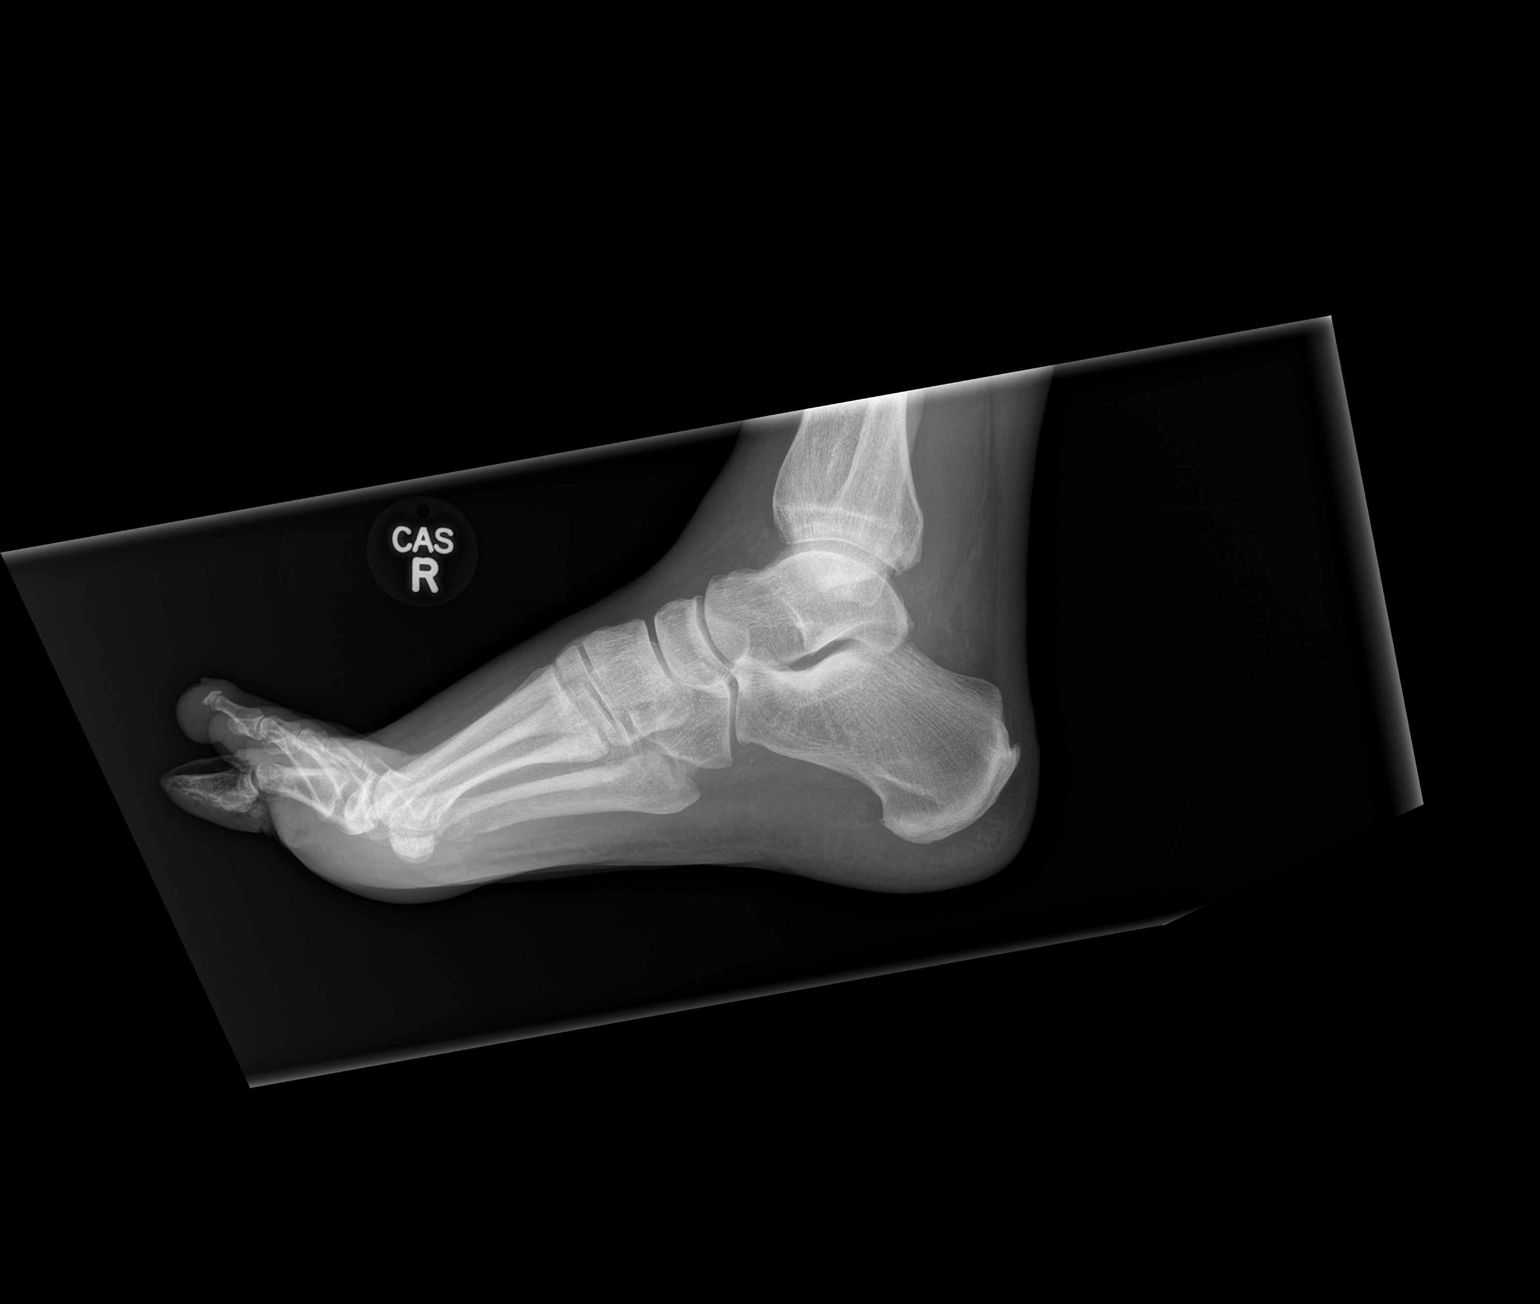

[3 of 3 positions shown; findings below may reference images not displayed]

FINDINGS: Extensive gas surrounds the distal phalanx in the right great toe.
There is marked osteopenia and erosion of the tuft. There is mild
osteopenia in the distal aspect of the proximal phalanx is well. The
joint is located.

Extensive soft tissue swelling is noted in the distal second toe is
well with a sclerotic foreshortened distal phalanx. No other focal
bone changes are evident. The proximal foot is unremarkable.
IMPRESSION: 1. Osteomyelitis of the distal phalanx in the great toe with a soft
tissue gas producing abscess.
2. Soft tissue swelling about the distal aspect second toe with a
sclerotic foreshortened distal phalanx. This may represent chronic
infection.

## 2015-07-27 IMAGING — CR DG FOOT COMPLETE 3+V*L*
3 series · 3 of 3 positions shown · non-contrast
Comparison: None.

CLINICAL DATA: Bilateral foot pain, redness, swelling.

EXAM:
LEFT FOOT - COMPLETE 3+ VIEW

[x foot ap left]
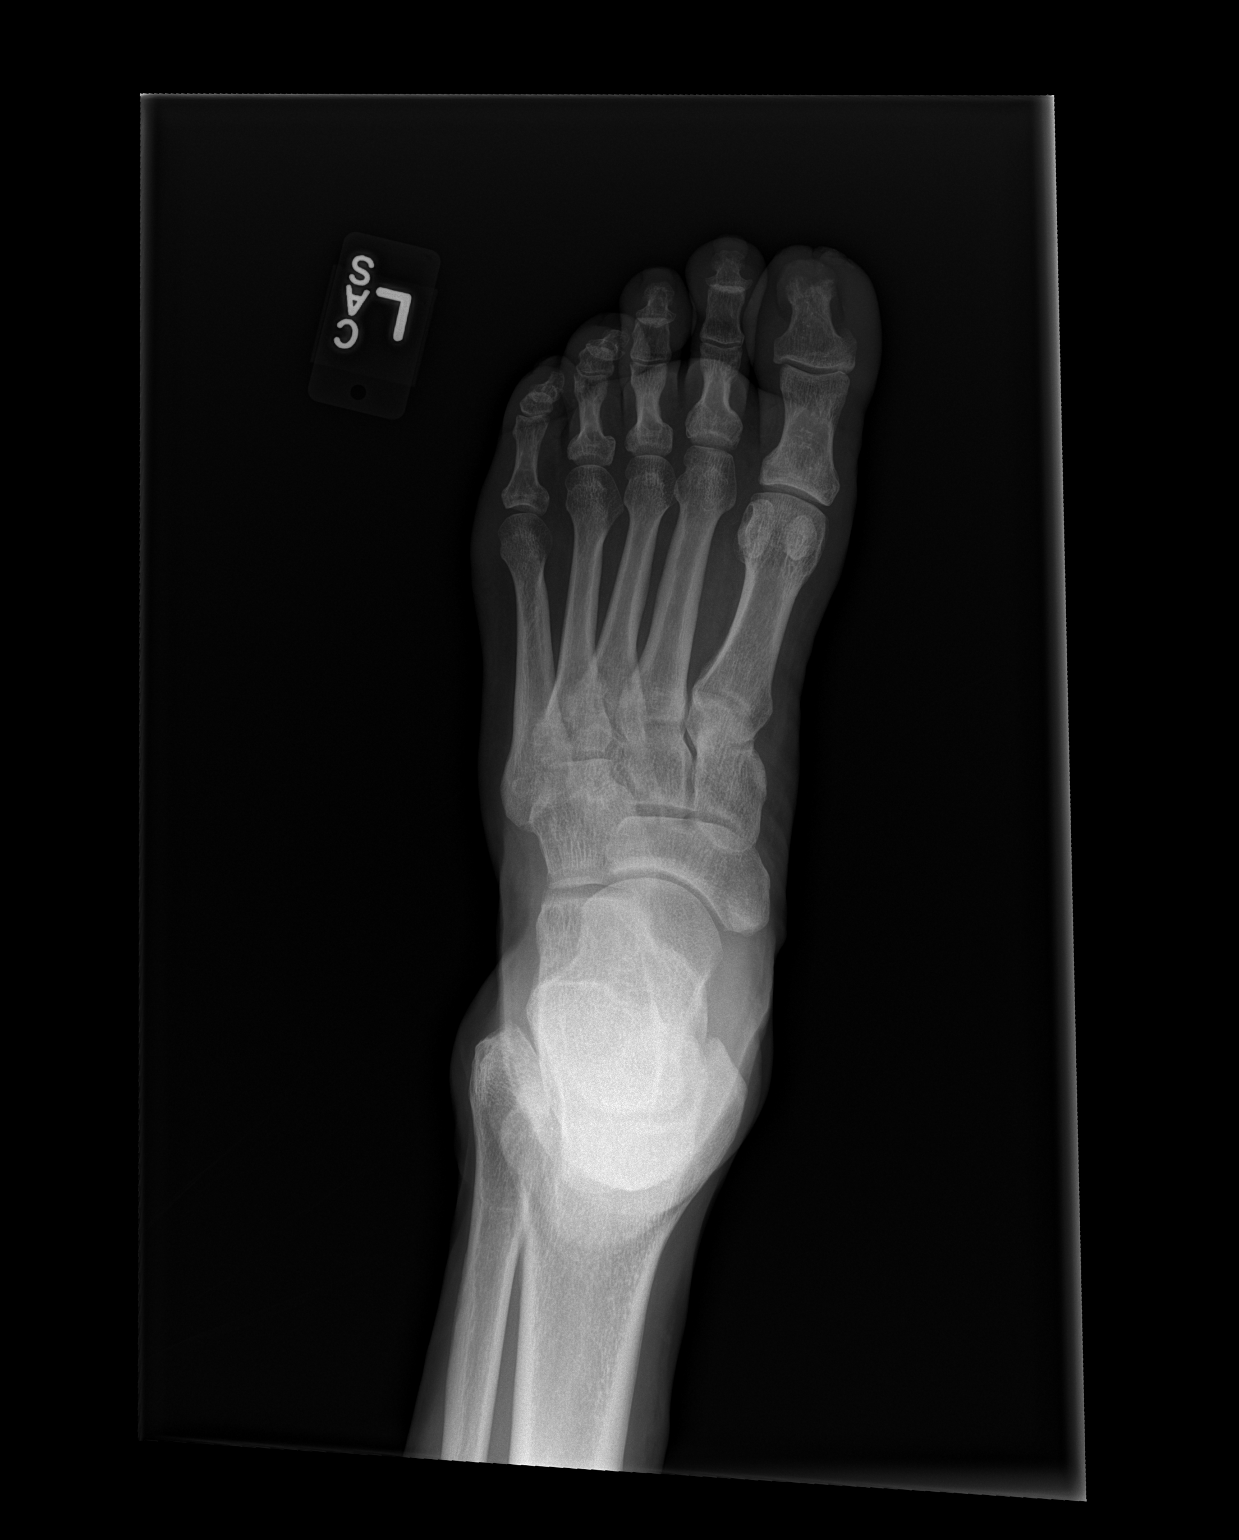

[x foot obl left]
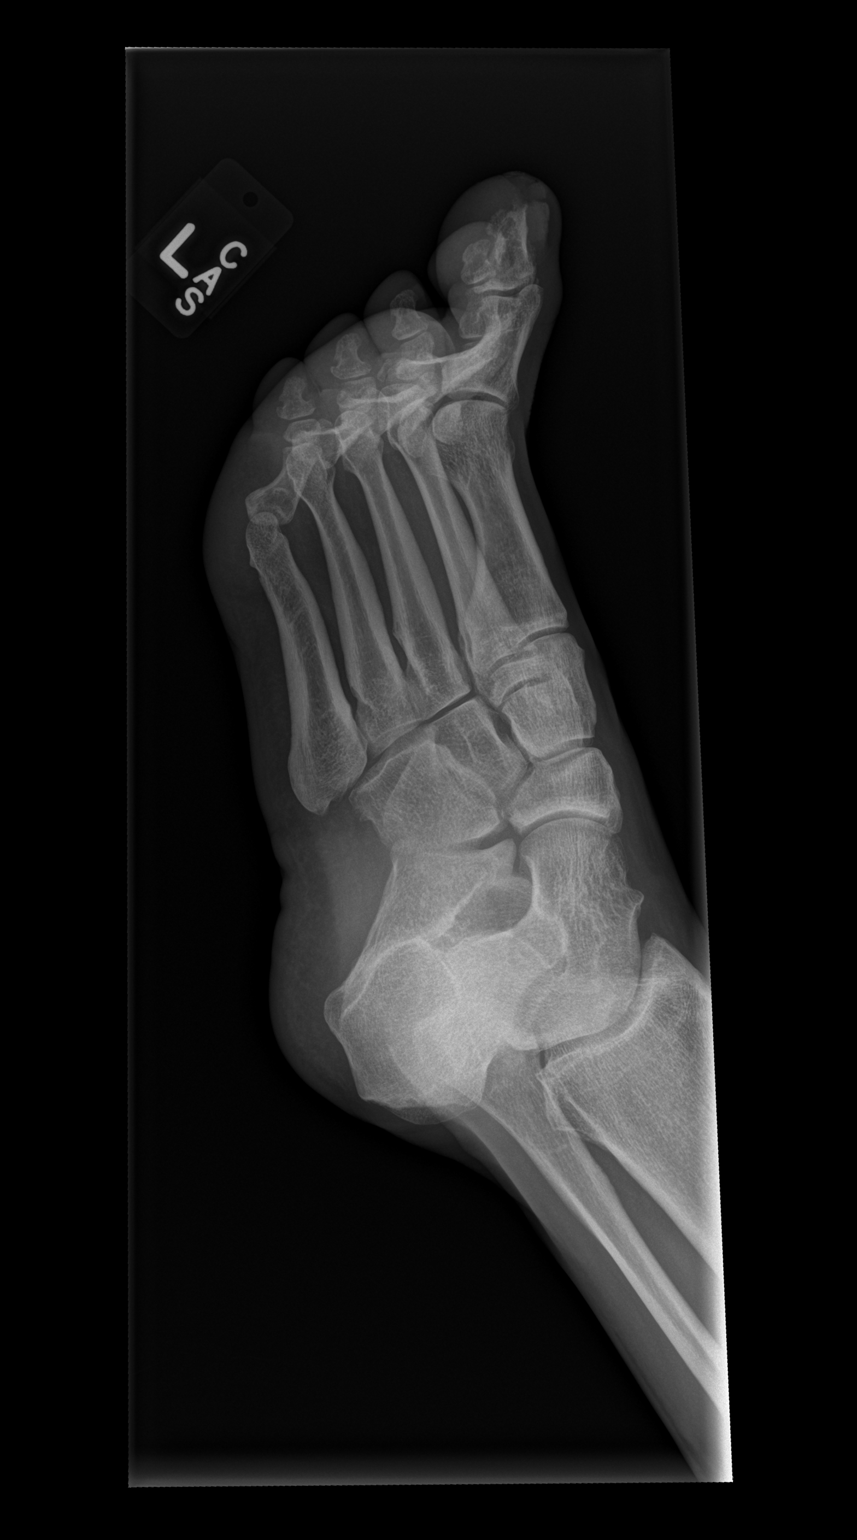

[x foot lat left]
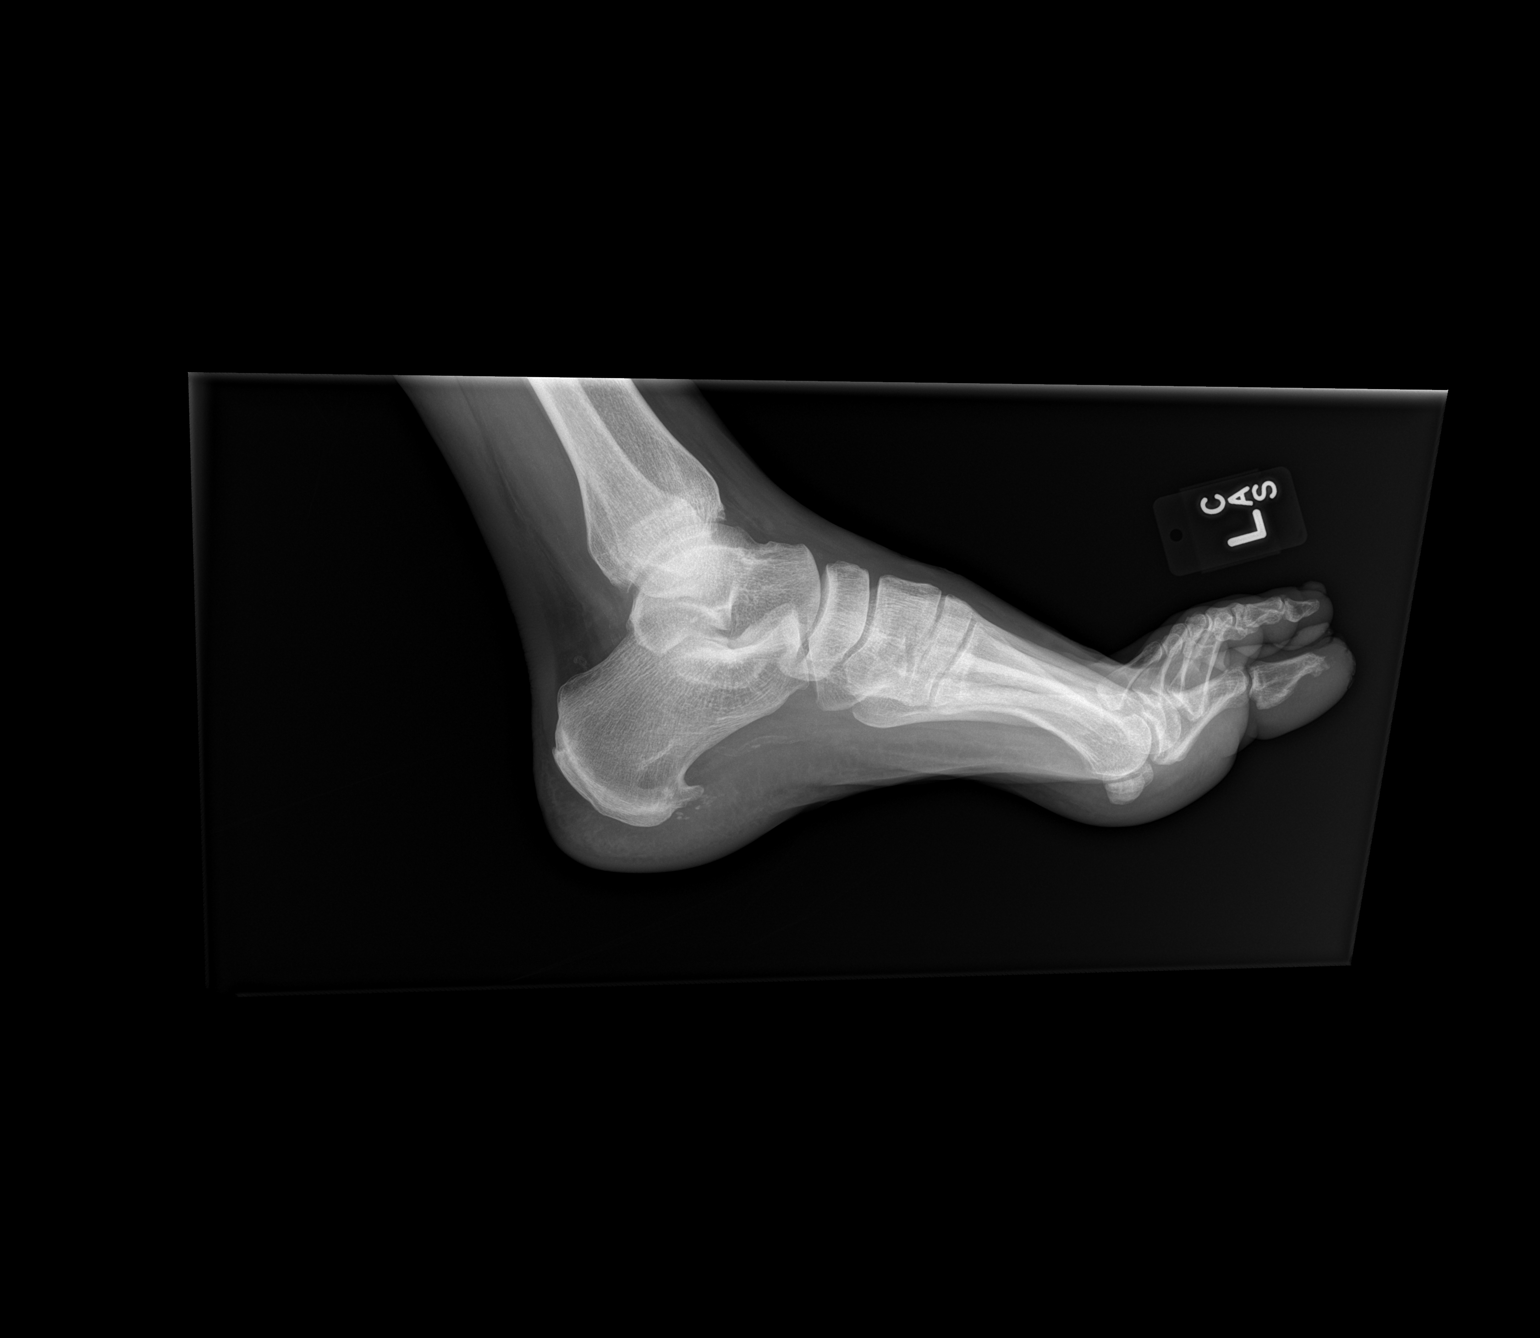

[3 of 3 positions shown; findings below may reference images not displayed]

FINDINGS: There is irregular lucency at the tip of the left great toe distal
phalanx concerning for osteomyelitis. There appears to be a
overlying soft tissue defect at the tip of the great toe. Recommend
clinical correlation.

No additional acute bony abnormality. No fracture, subluxation or
dislocation.
IMPRESSION: Findings suspicious for osteomyelitis at the tip of the left great
toe distal phalanx.

## 2015-08-23 MED FILL — ACCU-CHEK AVIVA PLUS TEST S: 25 days supply | Qty: 100 | Fill #3

## 2015-09-07 ENCOUNTER — Other Ambulatory Visit: Payer: Self-pay | Admitting: *Deleted

## 2015-09-07 DIAGNOSIS — E118 Type 2 diabetes mellitus with unspecified complications: Secondary | ICD-10-CM

## 2015-09-07 MED ORDER — INSULIN PEN NEEDLE 31G X 5 MM MISC: 1.0000 | Status: DC

## 2015-09-07 MED FILL — ULTICARE PEN NDL 4MM 32G: 32G X 4 MM | 25 days supply | Qty: 100 | Fill #0

## 2015-09-07 NOTE — Telephone Encounter (Signed)
Patient uses [employer] Plus pens instead of the syringes. Prescription has been reordered.

## 2015-09-24 MED FILL — ACCU-CHEK AVIVA PLUS TEST S: 25 days supply | Qty: 100 | Fill #4

## 2015-10-14 ENCOUNTER — Ambulatory Visit: Payer: Medicaid Other | Attending: Internal Medicine | Admitting: Internal Medicine

## 2015-10-14 ENCOUNTER — Encounter: Payer: Self-pay | Admitting: Internal Medicine

## 2015-10-14 VITALS — BP 115/73 | HR 79 | Temp 98.0°F | Resp 18 | Ht 71.0 in | Wt 161.0 lb

## 2015-10-14 DIAGNOSIS — Z794 Long term (current) use of insulin: Secondary | ICD-10-CM | POA: Diagnosis not present

## 2015-10-14 DIAGNOSIS — Z79899 Other long term (current) drug therapy: Secondary | ICD-10-CM | POA: Diagnosis not present

## 2015-10-14 DIAGNOSIS — E785 Hyperlipidemia, unspecified: Secondary | ICD-10-CM | POA: Diagnosis not present

## 2015-10-14 DIAGNOSIS — I10 Essential (primary) hypertension: Secondary | ICD-10-CM | POA: Diagnosis not present

## 2015-10-14 DIAGNOSIS — E119 Type 2 diabetes mellitus without complications: Secondary | ICD-10-CM | POA: Insufficient documentation

## 2015-10-14 DIAGNOSIS — E118 Type 2 diabetes mellitus with unspecified complications: Secondary | ICD-10-CM | POA: Insufficient documentation

## 2015-10-14 DIAGNOSIS — E784 Other hyperlipidemia: Secondary | ICD-10-CM

## 2015-10-14 DIAGNOSIS — I739 Peripheral vascular disease, unspecified: Secondary | ICD-10-CM | POA: Diagnosis not present

## 2015-10-14 LAB — GLUCOSE, POCT (MANUAL RESULT ENTRY): POC Glucose: 110 mg/dl — AB (ref 70–99)

## 2015-10-14 LAB — POCT GLYCOSYLATED HEMOGLOBIN (HGB A1C): HEMOGLOBIN A1C: 6.3

## 2015-10-14 NOTE — Patient Instructions (Signed)
Diabetes and Exercise Exercising regularly is important. It is not just about losing weight. It has many health benefits, such as:  Improving your overall fitness, flexibility, and endurance.  Increasing your bone density.  Helping with weight control.  Decreasing your body fat.  Increasing your muscle strength.  Reducing stress and tension.  Improving your overall health. People with diabetes who exercise gain additional benefits because exercise:  Reduces appetite.  Improves the body's use of blood sugar (glucose).  Helps lower or control blood glucose.  Decreases blood pressure.  Helps control blood lipids (such as cholesterol and triglycerides).  Improves the body's use of the hormone insulin by:  Increasing the body's insulin sensitivity.  Reducing the body's insulin needs.  Decreases the risk for heart disease because exercising:  Lowers cholesterol and triglycerides levels.  Increases the levels of good cholesterol (such as high-density lipoproteins [HDL]) in the body.  Lowers blood glucose levels. YOUR ACTIVITY PLAN  Choose an activity that you enjoy, and set realistic goals. To exercise safely, you should begin practicing any new physical activity slowly, and gradually increase the intensity of the exercise over time. Your health care provider or diabetes educator can help create an activity plan that works for you. General recommendations include:  Encouraging children to engage in at least 60 minutes of physical activity each day.  Stretching and performing strength training exercises, such as yoga or weight lifting, at least 2 times per week.  Performing a total of at least 150 minutes of moderate-intensity exercise each week, such as brisk walking or water aerobics.  Exercising at least 3 days per week, making sure you allow no more than 2 consecutive days to pass without exercising.  Avoiding long periods of inactivity (90 minutes or more). When you  have to spend an extended period of time sitting down, take frequent breaks to walk or stretch. RECOMMENDATIONS FOR EXERCISING WITH TYPE 1 OR TYPE 2 DIABETES   Check your blood glucose before exercising. If blood glucose levels are greater than 240 mg/dL, check for urine ketones. Do not exercise if ketones are present.  Avoid injecting insulin into areas of the body that are going to be exercised. For example, avoid injecting insulin into:  The arms when playing tennis.  The legs when jogging.  Keep a record of:  Food intake before and after you exercise.  Expected peak times of insulin action.  Blood glucose levels before and after you exercise.  The type and amount of exercise you have done.  Review your records with your health care provider. Your health care provider will help you to develop guidelines for adjusting food intake and insulin amounts before and after exercising.  If you take insulin or oral hypoglycemic agents, watch for signs and symptoms of hypoglycemia. They include:  Dizziness.  Shaking.  Sweating.  Chills.  Confusion.  Drink plenty of water while you exercise to prevent dehydration or heat stroke. Body water is lost during exercise and must be replaced.  Talk to your health care provider before starting an exercise program to make sure it is safe for you. Remember, almost any type of activity is better than none.   This information is not intended to replace advice given to you by your health care provider. Make sure you discuss any questions you have with your health care provider.   Document Released: 10/07/2003 Document Revised: 12/01/2014 Document Reviewed: 12/24/2012 Elsevier Interactive Patient Education 2016 Elsevier Inc. Basic Carbohydrate Counting for Diabetes Mellitus Carbohydrate counting   is a method for keeping track of the amount of carbohydrates you eat. Eating carbohydrates naturally increases the level of sugar (glucose) in your  blood, so it is important for you to know the amount that is okay for you to have in every meal. Carbohydrate counting helps keep the level of glucose in your blood within normal limits. The amount of carbohydrates allowed is different for every person. A dietitian can help you calculate the amount that is right for you. Once you know the amount of carbohydrates you can have, you can count the carbohydrates in the foods you want to eat. Carbohydrates are found in the following foods:  Grains, such as breads and cereals.  Dried beans and soy products.  Starchy vegetables, such as potatoes, peas, and corn.  Fruit and fruit juices.  Milk and yogurt.  Sweets and snack foods, such as cake, cookies, candy, chips, soft drinks, and fruit drinks. CARBOHYDRATE COUNTING There are two ways to count the carbohydrates in your food. You can use either of the methods or a combination of both. Reading the "Nutrition Facts" on Packaged Food The "Nutrition Facts" is an area that is included on the labels of almost all packaged food and beverages in the [place]. It includes the serving size of that food or beverage and information about the nutrients in each serving of the food, including the grams (g) of carbohydrate per serving.  Decide the number of servings of this food or beverage that you will be able to eat or drink. Multiply that number of servings by the number of grams of carbohydrate that is listed on the label for that serving. The total will be the amount of carbohydrates you will be having when you eat or drink this food or beverage. Learning Standard Serving Sizes of Food When you eat food that is not packaged or does not include "Nutrition Facts" on the label, you need to measure the servings in order to count the amount of carbohydrates.A serving of most carbohydrate-rich foods contains about 15 g of carbohydrates. The following list includes serving sizes of carbohydrate-rich foods that  provide 15 g ofcarbohydrate per serving:   1 slice of bread (1 oz) or 1 six-inch tortilla.    of a hamburger bun or English muffin.  4-6 crackers.   cup unsweetened dry cereal.    cup hot cereal.   cup rice or pasta.    cup mashed potatoes or  of a large baked potato.  1 cup fresh fruit or one small piece of fruit.    cup canned or frozen fruit or fruit juice.  1 cup milk.   cup plain fat-free yogurt or yogurt sweetened with artificial sweeteners.   cup cooked dried beans or starchy vegetable, such as peas, corn, or potatoes.  Decide the number of standard-size servings that you will eat. Multiply that number of servings by 15 (the grams of carbohydrates in that serving). For example, if you eat 2 cups of strawberries, you will have eaten 2 servings and 30 g of carbohydrates (2 servings x 15 g = 30 g). For foods such as soups and casseroles, in which more than one food is mixed in, you will need to count the carbohydrates in each food that is included. EXAMPLE OF CARBOHYDRATE COUNTING Sample Dinner  3 oz chicken breast.   cup of brown rice.   cup of corn.  1 cup milk.   1 cup strawberries with sugar-free whipped topping.  Carbohydrate Calculation Step   1: Identify the foods that contain carbohydrates:   Rice.   Corn.   Milk.   Strawberries. Step 2:Calculate the number of servings eaten of each:   2 servings of rice.   1 serving of corn.   1 serving of milk.   1 serving of strawberries. Step 3: Multiply each of those number of servings by 15 g:   2 servings of rice x 15 g = 30 g.   1 serving of corn x 15 g = 15 g.   1 serving of milk x 15 g = 15 g.   1 serving of strawberries x 15 g = 15 g. Step 4: Add together all of the amounts to find the total grams of carbohydrates eaten: 30 g + 15 g + 15 g + 15 g = 75 g.   This information is not intended to replace advice given to you by your health care provider. Make sure you  discuss any questions you have with your health care provider.   Document Released: 07/17/2005 Document Revised: 08/07/2014 Document Reviewed: 06/13/2013 Elsevier Interactive Patient Education 2016 Elsevier Inc.  

## 2015-10-14 NOTE — Progress Notes (Signed)
Patient is here for FU  [facility]  Patient would like his flu shot.

## 2015-10-14 NOTE — Progress Notes (Signed)
Patient ID: KOLIN ERDAHL, male   DOB: 28-Sep-1960, 55 y.o.   MRN: [id number]   Charlies Rayburn, is a 55 y.o. male  HWE:[id number]  ELF:[id number]  DOB - [birth year]  Chief Complaint  Patient presents with  . Follow-up        Subjective:   Boysie Bonebrake is a 55 y.o. 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 Patient is doing very well, happy with his prosthesis, blood sugar is very well controlled as well as blood sugar. His recent hemoglobin A1c's have been below 7%. He has no new complaint today. He does not need any of his medication refill. Patient has No headache, No chest pain, No abdominal pain - No Nausea, No new weakness tingling or numbness, No Cough - SOB.  Problem  Type 2 Diabetes Mellitus With Complication, With Long-Term Current Use of Insulin (Hcc)  Dyslipidemia (High Ldl; Low Hdl)    ALLERGIES: Allergies  Allergen Reactions  . Metformin And Related Other (See Comments)    Pt states that metformin made sugars fall into 30's    PAST MEDICAL HISTORY: Past Medical History  Diagnosis Date  . Hypertension   . Osteomyelitis of foot ([place])   . Heart murmur     was told "not to worry about it"  . Diabetes mellitus without complication ([place])     type 2  . Diabetic neuropathy ([place])   . Arthritis   . Anemia     low iron  . Peripheral vascular disease ([place])     "poor circulation" in feet and necrosis    MEDICATIONS AT HOME: Prior to Admission medications   Medication Sig Start Date End Date Taking? Authorizing Provider  atorvastatin (LIPITOR) 40 MG tablet Take 1 tablet (40 mg total) by mouth daily. 07/15/15  Yes Tresa Garter, MD  Blood Glucose Monitoring Suppl (ACCU-CHEK AVIVA PLUS) W/DEVICE KIT 1 each by Does not apply route 2 (two) times daily. 05/06/15  Yes Tresa Garter, MD  glucose blood (ACCU-CHEK AVIVA PLUS) test strip Use as  instructed 05/06/15  Yes Tresa Garter, MD  ibuprofen (ADVIL,MOTRIN) 200 MG tablet Take 400 mg by mouth every 6 (six) hours as needed for fever (pain).   Yes Historical Provider, MD  Insulin Pen Needle (TRUEPLUS PEN NEEDLES) 31G X 5 MM MISC 1 each by Does not apply route as directed. 09/07/15  Yes Tresa Garter, MD  Lancet Devices [facility]) lancets Use as instructed 05/06/15  Yes Tresa Garter, MD  Probiotic Product (PROBIOTIC PO) Take 1 tablet by mouth daily after lunch.   Yes Historical Provider, MD  TRUEPLUS INSULIN SYRINGE 30G X 5/16" 0.5 ML MISC USE AS DIRECTED 07/27/15  Yes Tresa Garter, MD  oxyCODONE-acetaminophen (PERCOCET/ROXICET) 5-325 MG per tablet Take 1-2 tablets by mouth every 4 (four) hours as needed for severe pain. Reported on 10/14/2015    Historical Provider, MD     Objective:   Filed Vitals:   10/14/15 1648  BP: 115/73  Pulse: 79  Temp: 98 F (36.7 C)  TempSrc: Oral  Resp: 18  Height: 5' 11"  (1.803 m)  Weight: 161 lb (73.029 kg)  SpO2: 100%    Exam General appearance : Awake, alert, not in any distress. Speech Clear. Not toxic looking HEENT: Atraumatic and Normocephalic, pupils equally reactive to light and accomodation Neck: supple, no JVD. No cervical lymphadenopathy.  Chest:Good air entry bilaterally, no added sounds  CVS: S1 S2 regular, no murmurs.  Abdomen: Bowel sounds present, Non tender and not distended with no gaurding, rigidity or rebound. Extremities: B/L Lower Ext shows no edema, both legs are warm to touch Neurology: Awake alert, and oriented X 3, CN II-XII intact, Non focal Skin:No Rash  Data Review Lab Results  Component Value Date   HGBA1C 6.3 10/14/2015   HGBA1C 6.10 07/15/2015   HGBA1C 6.80 02/25/2015     Assessment & Plan   1. Essential hypertension  We have discussed target BP range and blood pressure goal. I have advised patient to check BP regularly and to call us back or report to clinic if the  numbers are consistently higher than 140/90. We discussed the importance of compliance with medical therapy and DASH diet recommended, consequences of uncontrolled hypertension discussed.   - continue current BP medications  2. Type 2 diabetes mellitus with complication, with long-term current use of insulin (HCC)  - POCT A1C - Glucose (CBG)  Aim for 30 minutes of exercise most days. Rethink what you drink. Water is great! Aim for 2-3 Carb Choices per meal (30-45 grams) +/- 1 either way  Aim for 0-15 Carbs per snack if hungry  Include protein in moderation with your meals and snacks  Consider reading food labels for Total Carbohydrate and Fat Grams of foods  Consider checking BG at alternate times per day  Continue taking medication as directed Be mindful about how much sugar you are adding to beverages and other foods. Fruit Punch - find one with no sugar  Measure and decrease portions of carbohydrate foods  Make your plate and don't go back for seconds  3. Dyslipidemia (high LDL; low HDL)  To address this please limit saturated fat to no more than 7% of your calories, limit cholesterol to 200 mg/day, increase fiber and exercise as tolerated. If needed we may add another cholesterol lowering medication to your regimen.   Patient have been counseled extensively about nutrition and exercise  Return in about 3 months (around 01/14/2016) for Hemoglobin A1C and Follow up, DM, Follow up HTN.  The patient was given clear instructions to go to ER or return to medical center if symptoms don't improve, worsen or new problems develop. The patient verbalized understanding. The patient was told to call to get lab results if they haven't heard anything in the next week.   This note has been created with Surveyor, quantity. Any transcriptional errors are unintentional.    Angelica Chessman, MD, [place], [place], [place], [place] and  [place], [place]   10/14/2015, 6:01 PM

## 2015-10-15 ENCOUNTER — Encounter: Payer: Self-pay | Admitting: Clinical

## 2015-10-15 NOTE — Progress Notes (Signed)
[facility] 10/14/2015 06/23/2014 03/24/2014 03/24/2014 02/03/2014  Decreased Interest 1 0 0 0 0  Down, Depressed, Hopeless 1 0 0 0 1  PHQ - 2 Score 2 0 0 0 1  Altered sleeping 0 - - - -  Tired, decreased energy 1 - - - -  Change in appetite 0 - - - -  Feeling bad or failure about yourself  1 - - - -  Trouble concentrating 1 - - - -  Moving slowly or fidgety/restless 0 - - - -  Suicidal thoughts 0 - - - -  PHQ-9 Score 5 - - - -    GAD 7 : Generalized Anxiety Score 10/14/2015  Nervous, Anxious, on Edge 1  Control/stop worrying 0  Worry too much - different things 0  Trouble relaxing 0  Restless 1  Easily annoyed or irritable 0  Afraid - awful might happen 0  Total GAD 7 Score 2

## 2015-10-28 MED FILL — ACCU-CHEK AVIVA PLUS TEST S: 25 days supply | Qty: 100 | Fill #5

## 2015-11-29 MED FILL — ACCU-CHEK AVIVA PLUS TEST S: 25 days supply | Qty: 100 | Fill #6

## 2015-12-31 MED FILL — ACCU-CHEK AVIVA PLUS TEST S: 25 days supply | Qty: 100 | Fill #7

## 2016-01-05 MED FILL — DOXYCYCLINE 100 MG TABLET: 100 | 14 days supply | Qty: 28 | Fill #0

## 2016-01-14 ENCOUNTER — Ambulatory Visit: Payer: Medicaid Other | Attending: Family Medicine | Admitting: Family Medicine

## 2016-01-14 ENCOUNTER — Encounter: Payer: Self-pay | Admitting: Family Medicine

## 2016-01-14 ENCOUNTER — Encounter: Payer: Self-pay | Admitting: Internal Medicine

## 2016-01-14 VITALS — BP 147/73 | HR 65 | Temp 97.6°F | Resp 16 | Ht 71.0 in | Wt 161.0 lb

## 2016-01-14 DIAGNOSIS — Z794 Long term (current) use of insulin: Secondary | ICD-10-CM | POA: Insufficient documentation

## 2016-01-14 DIAGNOSIS — E785 Hyperlipidemia, unspecified: Secondary | ICD-10-CM | POA: Diagnosis not present

## 2016-01-14 DIAGNOSIS — Z0000 Encounter for general adult medical examination without abnormal findings: Secondary | ICD-10-CM

## 2016-01-14 DIAGNOSIS — E11621 Type 2 diabetes mellitus with foot ulcer: Secondary | ICD-10-CM | POA: Diagnosis not present

## 2016-01-14 DIAGNOSIS — L97519 Non-pressure chronic ulcer of other part of right foot with unspecified severity: Secondary | ICD-10-CM | POA: Insufficient documentation

## 2016-01-14 DIAGNOSIS — Z89512 Acquired absence of left leg below knee: Secondary | ICD-10-CM | POA: Insufficient documentation

## 2016-01-14 DIAGNOSIS — Z8739 Personal history of other diseases of the musculoskeletal system and connective tissue: Secondary | ICD-10-CM | POA: Diagnosis not present

## 2016-01-14 DIAGNOSIS — E118 Type 2 diabetes mellitus with unspecified complications: Secondary | ICD-10-CM

## 2016-01-14 DIAGNOSIS — E784 Other hyperlipidemia: Secondary | ICD-10-CM

## 2016-01-14 DIAGNOSIS — I10 Essential (primary) hypertension: Secondary | ICD-10-CM | POA: Insufficient documentation

## 2016-01-14 DIAGNOSIS — Z89411 Acquired absence of right great toe: Secondary | ICD-10-CM | POA: Insufficient documentation

## 2016-01-14 LAB — LIPID PANEL
Cholesterol: 172 mg/dL (ref 125–200)
HDL: 45 mg/dL (ref >=40)
LDL CALC: 116 mg/dL (ref <130)
Total CHOL/HDL Ratio: 3.8 Ratio (ref <=5.0)
Triglycerides: 54 mg/dL (ref <150)
VLDL: 11 mg/dL (ref <30)

## 2016-01-14 LAB — POCT GLYCOSYLATED HEMOGLOBIN (HGB A1C): HEMOGLOBIN A1C: 6

## 2016-01-14 LAB — GLUCOSE, POCT (MANUAL RESULT ENTRY): POC Glucose: 91 mg/dl (ref 70–99)

## 2016-01-14 MED ORDER — "GAUZE DRESSING 4""X4"" PADS": 4.0000 | MEDICATED_PAD | Freq: Every day | Status: DC

## 2016-01-14 MED ORDER — GAUZE BANDAGE 3" MISC
1.0000 | Freq: Every day | Status: DC
Start: 2016-01-14 — End: 2016-02-29

## 2016-01-14 NOTE — Progress Notes (Signed)
Subjective:  Patient ID: Ryan [name], male    DOB: 05-22-1961  Age: 55 y.o. MRN: [id number]  CC: Diabetes and Hypertension   HPI Ryan [name] has DM2, hx of HTN, hx of osteomyelitis, s/p LBKA, s/p R 1st metatarsal amputation he presents for    1. DM2: CBGs 60-108. No medication. He denies polyuria or polydipsia. He has not had diabetic eye exam in past year.   2. HTN: home BP ranges 113-120/70-82. No treatment. No HA, CP, SOB or leg swelling.   3. HLD: not taking lipitor 40 mg daily due to aches while on the medication for one week.   4. Diabetic foot: R foot with flexion of 2nd and 3rd toes. He is s/p 1st metatarsal amputation. He is followed closely by ortho. He recently developed a blister on the plantars aspect of the second metatarsal. He was treated with doxycycline for one week. He has f/u with ortho in 5 days. He has minimal pain. No fever or chills. Minimal serous drainage from blister.   Social History  Substance Use Topics  . Smoking status: Never Smoker   . Smokeless tobacco: Current User    Types: Snuff  . Alcohol Use: No     Comment: " not since last summer"    Outpatient Prescriptions Prior to Visit  Medication Sig Dispense Refill  . atorvastatin (LIPITOR) 40 MG tablet Take 1 tablet (40 mg total) by mouth daily. 90 tablet 3  . Blood Glucose Monitoring Suppl (ACCU-CHEK AVIVA PLUS) W/DEVICE KIT 1 each by Does not apply route 2 (two) times daily. 1 kit 0  . glucose blood (ACCU-CHEK AVIVA PLUS) test strip Use as instructed 100 each 12  . ibuprofen (ADVIL,MOTRIN) 200 MG tablet Take 400 mg by mouth every 6 (six) hours as needed for fever (pain).    . Insulin Pen Needle (TRUEPLUS PEN NEEDLES) 31G X 5 MM MISC 1 each by Does not apply route as directed. 100 each 12  . Lancet Devices (ACCU-CHEK SOFTCLIX) lancets Use as instructed 1 each 0  . oxyCODONE-acetaminophen (PERCOCET/ROXICET) 5-325 MG per tablet Take 1-2 tablets by mouth every 4 (four) hours as needed for  severe pain. Reported on 10/14/2015    . Probiotic Product (PROBIOTIC PO) Take 1 tablet by mouth daily after lunch.    Karen Chafe INSULIN SYRINGE 30G X 5/16" 0.5 ML MISC USE AS DIRECTED 100 each 12   No facility-administered medications prior to visit.    ROS Review of Systems  Constitutional: Negative for fever, chills, fatigue and unexpected weight change.  Eyes: Negative for visual disturbance.  Respiratory: Negative for cough and shortness of breath.   Cardiovascular: Negative for chest pain, palpitations and leg swelling.  Gastrointestinal: Negative for nausea, vomiting, abdominal pain, diarrhea, constipation and blood in stool.  Endocrine: Negative for polydipsia, polyphagia and polyuria.  Musculoskeletal: Negative for myalgias, back pain, arthralgias, gait problem and neck pain.  Skin: Negative for rash.  Allergic/Immunologic: Negative for immunocompromised state.  Hematological: Negative for adenopathy. Does not bruise/bleed easily.  Psychiatric/Behavioral: Negative for suicidal ideas, sleep disturbance and dysphoric mood. The patient is not nervous/anxious.     Objective:  BP 147/73 mmHg  Pulse 65  Temp(Src) 97.6 F (36.4 C) (Oral)  Resp 16  Ht _0  (1.803 m)  Wt 161 lb (73.029 kg)  BMI 22.46 kg/m2  SpO2 99%  BP/Weight 01/14/2016 10/14/2015 07/23/8249  Systolic BP 037 048 889  Diastolic BP 73 73 75  Wt. (Lbs) 161 161 168.8  BMI 22.46 22.46 23.55    Physical Exam  Constitutional: He appears well-developed and well-nourished. No distress.  HENT:  Head: Normocephalic and atraumatic.  Neck: Normal range of motion. Neck supple.  Cardiovascular: Normal rate, regular rhythm, normal heart sounds and intact distal pulses.   Pulmonary/Chest: Effort normal and breath sounds normal.  Musculoskeletal: He exhibits no edema.       Legs:      Feet:  Neurological: He is alert.  Skin: Skin is warm and dry. No rash noted. No erythema.  Psychiatric: He has a normal mood and  affect.   Lab Results  Component Value Date   HGBA1C 6.0 01/14/2016   CBG 91 Lab Results  Component Value Date   CHOL 172 01/14/2016   HDL 45 01/14/2016   LDLCALC 116 01/14/2016   TRIG 54 01/14/2016   CHOLHDL 3.8 01/14/2016     Assessment & Plan:   [place] was seen today for diabetes and hypertension.  Diagnoses and all orders for this visit:  Type 2 diabetes mellitus with complication, with long-term current use of insulin (HCC) -     HgB A1c -     Glucose (CBG) -     Ambulatory referral to Ophthalmology  Dyslipidemia (high LDL; low HDL) -     Lipid Panel  Healthcare maintenance -     Ambulatory referral to Gastroenterology  H/O osteomyelitis  Type 2 diabetes mellitus with complication, without long-term current use of insulin (HCC)  Diabetic ulcer of right foot associated with type 2 diabetes mellitus (HCC) -     Gauze Pads & Dressings (GAUZE DRESSING) 4"X4" PADS; 4 each by Does not apply route daily. ICD 11 E11.621 -     Gauze Pads & Dressings (GAUZE BANDAGE 3") MISC; 1 each by Does not apply route daily. ICD 10 E11.621   No orders of the defined types were placed in this encounter.    Follow-up: Return in about 6 months (around 07/15/2016) for diabetes .   Boykin Nearing MD

## 2016-01-14 NOTE — Patient Instructions (Addendum)
Ryan [name] was seen today for diabetes and hypertension.  Diagnoses and all orders for this visit:  Type 2 diabetes mellitus with complication, with long-term current use of insulin (HCC) -     HgB A1c -     Glucose (CBG) -     Ambulatory referral to Ophthalmology  Dyslipidemia (high LDL; low HDL) -     Lipid Panel  Healthcare maintenance -     Ambulatory referral to Gastroenterology  H/O osteomyelitis  Type 2 diabetes mellitus with complication, without long-term current use of insulin (HCC)  Diabetic ulcer of right foot associated with type 2 diabetes mellitus (HCC) -     Gauze Pads & Dressings (GAUZE DRESSING) 4"X4" PADS; 4 each by Does not apply route daily. ICD 11 E11.621 -     Gauze Pads & Dressings (GAUZE BANDAGE 3") MISC; 1 each by Does not apply route daily. ICD 10 E11.621   Keep close follow up with orth  F/u in 6 months for diabetes   Dr. Armen PickupFunches

## 2016-01-14 NOTE — Progress Notes (Signed)
F/U DM HTN  Glucose running 60-108 No medication  No pain today  No tobacco user  No suicidal thoughts in the past two weeks

## 2016-01-16 DIAGNOSIS — L97519 Non-pressure chronic ulcer of other part of right foot with unspecified severity: Secondary | ICD-10-CM

## 2016-01-16 DIAGNOSIS — E11621 Type 2 diabetes mellitus with foot ulcer: Secondary | ICD-10-CM | POA: Insufficient documentation

## 2016-01-16 MED ORDER — PRAVASTATIN SODIUM 40 MG PO TABS: 40.0000 mg | ORAL_TABLET | Freq: Every day | ORAL | Status: DC

## 2016-01-16 NOTE — Assessment & Plan Note (Signed)
Chronic in R foot Has shallow ulcer now Healing Closely followed by ortho

## 2016-01-16 NOTE — Assessment & Plan Note (Signed)
Currently well controlled without medication  Pravastatin for primary prevention of CVA and CAD, he did not tolerate lipitor

## 2016-01-16 NOTE — Assessment & Plan Note (Signed)
Current ulcer Ulcer healing Patient followed by ortho Rx for 4.x4 and gauze bandages provided

## 2016-01-17 MED FILL — PRAVASTATIN NA 40 MG TAB: 40 | 30 days supply | Qty: 30 | Fill #0

## 2016-01-19 MED FILL — SULFAMETHOXAZOLE-TMP SS TAB: 400-80 | 14 days supply | Qty: 28 | Fill #0

## 2016-02-02 MED FILL — ACCU-CHEK AVIVA PLUS TEST S: 25 days supply | Qty: 100 | Fill #8

## 2016-02-02 MED FILL — SULFAMETHOXAZOLE-TMP SS TAB: 400-80 | 21 days supply | Qty: 42 | Fill #0

## 2016-02-22 ENCOUNTER — Other Ambulatory Visit (HOSPITAL_COMMUNITY): Payer: Self-pay | Admitting: Family

## 2016-02-23 ENCOUNTER — Encounter (HOSPITAL_COMMUNITY): Payer: Self-pay | Admitting: *Deleted

## 2016-02-23 NOTE — Progress Notes (Signed)
Pt denies cardiac history. Pt is diabetic, no longer on medication. Last A1C was 6.0 on 01/14/16. States fasting blood sugar is usually around 89-100

## 2016-02-24 MED ORDER — CEFAZOLIN SODIUM-DEXTROSE 2-4 GM/100ML-% IV SOLN
2.0000 g | INTRAVENOUS | Status: AC
  Administered 2016-02-25: 2 g via INTRAVENOUS
  Filled 2016-02-24: qty 100

## 2016-02-25 ENCOUNTER — Encounter (HOSPITAL_COMMUNITY)
Admission: RE | Disposition: A | Discharge status: Skilled Nursing Facility | Payer: Self-pay | Source: Ambulatory Visit | Attending: Orthopedic Surgery

## 2016-02-25 ENCOUNTER — Inpatient Hospital Stay (HOSPITAL_COMMUNITY): Payer: Medicaid Other | Admitting: Anesthesiology

## 2016-02-25 ENCOUNTER — Encounter (HOSPITAL_COMMUNITY): Payer: Self-pay | Admitting: *Deleted

## 2016-02-25 ENCOUNTER — Inpatient Hospital Stay (HOSPITAL_COMMUNITY)
Admission: RE | Admit: 2016-02-25 | Discharge: 2016-02-28 | DRG: 240 | Disposition: A | Discharge status: Skilled Nursing Facility | Payer: Medicaid Other | Source: Ambulatory Visit | Attending: Orthopedic Surgery | Admitting: Orthopedic Surgery

## 2016-02-25 DIAGNOSIS — M1990 Unspecified osteoarthritis, unspecified site: Secondary | ICD-10-CM | POA: Diagnosis present

## 2016-02-25 DIAGNOSIS — I10 Essential (primary) hypertension: Secondary | ICD-10-CM | POA: Diagnosis present

## 2016-02-25 DIAGNOSIS — E1140 Type 2 diabetes mellitus with diabetic neuropathy, unspecified: Secondary | ICD-10-CM | POA: Diagnosis present

## 2016-02-25 DIAGNOSIS — Z89511 Acquired absence of right leg below knee: Secondary | ICD-10-CM

## 2016-02-25 DIAGNOSIS — Z89512 Acquired absence of left leg below knee: Secondary | ICD-10-CM

## 2016-02-25 DIAGNOSIS — M869 Osteomyelitis, unspecified: Secondary | ICD-10-CM | POA: Diagnosis present

## 2016-02-25 DIAGNOSIS — E1169 Type 2 diabetes mellitus with other specified complication: Secondary | ICD-10-CM | POA: Diagnosis present

## 2016-02-25 DIAGNOSIS — F17290 Nicotine dependence, other tobacco product, uncomplicated: Secondary | ICD-10-CM | POA: Diagnosis present

## 2016-02-25 DIAGNOSIS — E1152 Type 2 diabetes mellitus with diabetic peripheral angiopathy with gangrene: Secondary | ICD-10-CM | POA: Diagnosis present

## 2016-02-25 HISTORY — DX: Headache: R51

## 2016-02-25 HISTORY — DX: Headache, unspecified: R51.9

## 2016-02-25 HISTORY — PX: AMPUTATION: SHX166

## 2016-02-25 LAB — BASIC METABOLIC PANEL
ANION GAP: 8 (ref 5–15)
BUN: 17 mg/dL (ref 6–20)
CALCIUM: 9 mg/dL (ref 8.9–10.3)
CO2: 23 mmol/L (ref 22–32)
Chloride: 106 mmol/L (ref 101–111)
Creatinine, Ser: 0.79 mg/dL (ref 0.61–1.24)
GLUCOSE: 123 mg/dL — AB (ref 65–99)
POTASSIUM: 4.2 mmol/L (ref 3.5–5.1)
SODIUM: 137 mmol/L (ref 135–145)

## 2016-02-25 LAB — CBC
HEMATOCRIT: 35.6 % — AB (ref 39.0–52.0)
HEMOGLOBIN: 11.9 g/dL — AB (ref 13.0–17.0)
MCH: 30.7 pg (ref 26.0–34.0)
MCHC: 33.4 g/dL (ref 30.0–36.0)
MCV: 92 fL (ref 78.0–100.0)
Platelets: 212 10*3/uL (ref 150–400)
RBC: 3.87 MIL/uL — ABNORMAL LOW (ref 4.22–5.81)
RDW: 13.1 % (ref 11.5–15.5)
WBC: 10.2 10*3/uL (ref 4.0–10.5)

## 2016-02-25 LAB — GLUCOSE, CAPILLARY
GLUCOSE-CAPILLARY: 108 mg/dL — AB (ref 65–99)
GLUCOSE-CAPILLARY: 128 mg/dL — AB (ref 65–99)
Glucose-Capillary: 112 mg/dL — ABNORMAL HIGH (ref 65–99)
Glucose-Capillary: 188 mg/dL — ABNORMAL HIGH (ref 65–99)
Glucose-Capillary: 189 mg/dL — ABNORMAL HIGH (ref 65–99)

## 2016-02-25 LAB — SURGICAL PCR SCREEN
MRSA, PCR: NEGATIVE
STAPHYLOCOCCUS AUREUS: NEGATIVE

## 2016-02-25 SURGERY — AMPUTATION BELOW KNEE
Anesthesia: General | Site: Leg Lower | Laterality: Right

## 2016-02-25 MED ORDER — OXYCODONE HCL 5 MG PO TABS
ORAL_TABLET | ORAL | Status: AC
  Filled 2016-02-25: qty 2

## 2016-02-25 MED ORDER — SODIUM CHLORIDE 0.9 % IV SOLN: INTRAVENOUS | Status: DC | PRN

## 2016-02-25 MED ORDER — HYDROMORPHONE HCL 1 MG/ML IJ SOLN
1.0000 mg | INTRAMUSCULAR | Status: DC | PRN
Start: 2016-02-25 — End: 2016-02-28

## 2016-02-25 MED ORDER — SODIUM CHLORIDE 0.9 % IV BOLUS (SEPSIS)
500.0000 mL | Freq: Once | INTRAVENOUS | Status: AC
  Administered 2016-02-25: 500 mL via INTRAVENOUS

## 2016-02-25 MED ORDER — HYDROMORPHONE HCL 1 MG/ML IJ SOLN
INTRAMUSCULAR | Status: AC
  Filled 2016-02-25: qty 2

## 2016-02-25 MED ORDER — INSULIN ASPART 100 UNIT/ML ~~LOC~~ SOLN
4.0000 [IU] | Freq: Three times a day (TID) | SUBCUTANEOUS | Status: DC
  Administered 2016-02-26 – 2016-02-28 (×3): 4 [IU] via SUBCUTANEOUS

## 2016-02-25 MED ORDER — METHOCARBAMOL 1000 MG/10ML IJ SOLN
500.0000 mg | Freq: Four times a day (QID) | INTRAVENOUS | Status: DC | PRN
  Filled 2016-02-25: qty 5

## 2016-02-25 MED ORDER — ACETAMINOPHEN 650 MG RE SUPP: 650.0000 mg | Freq: Four times a day (QID) | RECTAL | Status: DC | PRN

## 2016-02-25 MED ORDER — MEPERIDINE HCL 25 MG/ML IJ SOLN: 6.2500 mg | INTRAMUSCULAR | Status: DC | PRN

## 2016-02-25 MED ORDER — METOCLOPRAMIDE HCL 5 MG/ML IJ SOLN
5.0000 mg | Freq: Three times a day (TID) | INTRAMUSCULAR | Status: DC | PRN
  Administered 2016-02-25: 10 mg via INTRAVENOUS
  Filled 2016-02-25: qty 2

## 2016-02-25 MED ORDER — PROPOFOL 10 MG/ML IV BOLUS
INTRAVENOUS | Status: DC | PRN
  Administered 2016-02-25: 10 mg via INTRAVENOUS
  Administered 2016-02-25: 200 mg via INTRAVENOUS
  Administered 2016-02-25: 10 mg via INTRAVENOUS
  Administered 2016-02-25: 30 mg via INTRAVENOUS
  Administered 2016-02-25: 50 mg via INTRAVENOUS
  Administered 2016-02-25: 20 mg via INTRAVENOUS

## 2016-02-25 MED ORDER — LIDOCAINE 2% (20 MG/ML) 5 ML SYRINGE
INTRAMUSCULAR | Status: AC
  Filled 2016-02-25: qty 5

## 2016-02-25 MED ORDER — OXYCODONE HCL 5 MG PO TABS
5.0000 mg | ORAL_TABLET | ORAL | Status: DC | PRN
  Administered 2016-02-25: 10 mg via ORAL

## 2016-02-25 MED ORDER — ROCURONIUM BROMIDE 50 MG/5ML IV SOLN
INTRAVENOUS | Status: AC
  Filled 2016-02-25: qty 1

## 2016-02-25 MED ORDER — SODIUM CHLORIDE 0.9 % IV SOLN
INTRAVENOUS | Status: DC
  Administered 2016-02-25: 15:00:00 via INTRAVENOUS

## 2016-02-25 MED ORDER — LACTATED RINGERS IV SOLN
INTRAVENOUS | Status: DC
  Administered 2016-02-25: 12:00:00 via INTRAVENOUS

## 2016-02-25 MED ORDER — ONDANSETRON HCL 4 MG/2ML IJ SOLN
INTRAMUSCULAR | Status: DC | PRN
  Administered 2016-02-25: 4 mg via INTRAVENOUS

## 2016-02-25 MED ORDER — PROPOFOL 10 MG/ML IV BOLUS
INTRAVENOUS | Status: AC
  Filled 2016-02-25: qty 40

## 2016-02-25 MED ORDER — SUCCINYLCHOLINE CHLORIDE 200 MG/10ML IV SOSY
PREFILLED_SYRINGE | INTRAVENOUS | Status: AC
  Filled 2016-02-25: qty 30

## 2016-02-25 MED ORDER — MIDAZOLAM HCL 2 MG/2ML IJ SOLN
INTRAMUSCULAR | Status: AC
  Filled 2016-02-25: qty 2

## 2016-02-25 MED ORDER — FENTANYL CITRATE (PF) 250 MCG/5ML IJ SOLN
INTRAMUSCULAR | Status: AC
  Filled 2016-02-25: qty 5

## 2016-02-25 MED ORDER — ONDANSETRON HCL 4 MG PO TABS: 4.0000 mg | ORAL_TABLET | Freq: Four times a day (QID) | ORAL | Status: DC | PRN

## 2016-02-25 MED ORDER — ONDANSETRON HCL 4 MG/2ML IJ SOLN
INTRAMUSCULAR | Status: AC
  Filled 2016-02-25: qty 2

## 2016-02-25 MED ORDER — PHENYLEPHRINE 40 MCG/ML (10ML) SYRINGE FOR IV PUSH (FOR BLOOD PRESSURE SUPPORT)
PREFILLED_SYRINGE | INTRAVENOUS | Status: AC
  Filled 2016-02-25: qty 20

## 2016-02-25 MED ORDER — CHLORHEXIDINE GLUCONATE 4 % EX LIQD: 60.0000 mL | Freq: Once | CUTANEOUS | Status: DC

## 2016-02-25 MED ORDER — ONDANSETRON HCL 4 MG/2ML IJ SOLN: 4.0000 mg | Freq: Four times a day (QID) | INTRAMUSCULAR | Status: DC | PRN

## 2016-02-25 MED ORDER — ACETAMINOPHEN 325 MG PO TABS: 650.0000 mg | ORAL_TABLET | Freq: Four times a day (QID) | ORAL | Status: DC | PRN

## 2016-02-25 MED ORDER — LIDOCAINE HCL (CARDIAC) 20 MG/ML IV SOLN
INTRAVENOUS | Status: DC | PRN
  Administered 2016-02-25: 100 mg via INTRAVENOUS

## 2016-02-25 MED ORDER — 0.9 % SODIUM CHLORIDE (POUR BTL) OPTIME
TOPICAL | Status: DC | PRN
  Administered 2016-02-25: 1000 mL

## 2016-02-25 MED ORDER — PROMETHAZINE HCL 25 MG/ML IJ SOLN: 6.2500 mg | INTRAMUSCULAR | Status: DC | PRN

## 2016-02-25 MED ORDER — MEPERIDINE HCL 25 MG/ML IJ SOLN
25.0000 mg | Freq: Once | INTRAMUSCULAR | Status: AC
  Administered 2016-02-25: 25 mg via INTRAVENOUS
  Filled 2016-02-25: qty 1

## 2016-02-25 MED ORDER — PROPOFOL 10 MG/ML IV BOLUS
INTRAVENOUS | Status: AC
  Filled 2016-02-25: qty 20

## 2016-02-25 MED ORDER — METOCLOPRAMIDE HCL 5 MG PO TABS: 5.0000 mg | ORAL_TABLET | Freq: Three times a day (TID) | ORAL | Status: DC | PRN

## 2016-02-25 MED ORDER — ASPIRIN EC 325 MG PO TBEC
325.0000 mg | DELAYED_RELEASE_TABLET | Freq: Every day | ORAL | Status: DC
  Administered 2016-02-26 – 2016-02-28 (×3): 325 mg via ORAL
  Filled 2016-02-25 (×3): qty 1

## 2016-02-25 MED ORDER — METHOCARBAMOL 500 MG PO TABS
ORAL_TABLET | ORAL | Status: AC
  Filled 2016-02-25: qty 1

## 2016-02-25 MED ORDER — KETOROLAC TROMETHAMINE 30 MG/ML IJ SOLN
INTRAMUSCULAR | Status: AC
  Filled 2016-02-25: qty 1

## 2016-02-25 MED ORDER — KETOROLAC TROMETHAMINE 30 MG/ML IJ SOLN
30.0000 mg | Freq: Once | INTRAMUSCULAR | Status: AC
  Administered 2016-02-25: 30 mg via INTRAVENOUS

## 2016-02-25 MED ORDER — PHENYLEPHRINE HCL 10 MG/ML IJ SOLN
INTRAMUSCULAR | Status: DC | PRN
  Administered 2016-02-25: 40 ug via INTRAVENOUS
  Administered 2016-02-25: 80 ug via INTRAVENOUS

## 2016-02-25 MED ORDER — LIDOCAINE 2% (20 MG/ML) 5 ML SYRINGE
INTRAMUSCULAR | Status: AC
  Filled 2016-02-25: qty 15

## 2016-02-25 MED ORDER — DEXAMETHASONE SODIUM PHOSPHATE 10 MG/ML IJ SOLN
INTRAMUSCULAR | Status: AC
  Filled 2016-02-25: qty 1

## 2016-02-25 MED ORDER — HYDROMORPHONE HCL 1 MG/ML IJ SOLN
0.2500 mg | INTRAMUSCULAR | Status: DC | PRN
  Administered 2016-02-25 (×4): 0.5 mg via INTRAVENOUS

## 2016-02-25 MED ORDER — SODIUM CHLORIDE 0.9 % IV SOLN
INTRAVENOUS | Status: DC
  Administered 2016-02-25: 20:00:00 via INTRAVENOUS

## 2016-02-25 MED ORDER — FENTANYL CITRATE (PF) 100 MCG/2ML IJ SOLN
INTRAMUSCULAR | Status: DC | PRN
  Administered 2016-02-25 (×5): 50 ug via INTRAVENOUS

## 2016-02-25 MED ORDER — INSULIN ASPART 100 UNIT/ML ~~LOC~~ SOLN
0.0000 [IU] | Freq: Three times a day (TID) | SUBCUTANEOUS | Status: DC
Start: 2016-02-25 — End: 2016-02-28
  Administered 2016-02-25: 3 [IU] via SUBCUTANEOUS
  Administered 2016-02-26 – 2016-02-27 (×2): 2 [IU] via SUBCUTANEOUS

## 2016-02-25 MED ORDER — CEFAZOLIN IN D5W 1 GM/50ML IV SOLN
1.0000 g | Freq: Four times a day (QID) | INTRAVENOUS | Status: AC
  Administered 2016-02-25 – 2016-02-26 (×3): 1 g via INTRAVENOUS
  Filled 2016-02-25 (×3): qty 50

## 2016-02-25 MED ORDER — KETOROLAC TROMETHAMINE 15 MG/ML IJ SOLN: 15.0000 mg | Freq: Four times a day (QID) | INTRAMUSCULAR | Status: AC

## 2016-02-25 MED ORDER — METHOCARBAMOL 500 MG PO TABS
500.0000 mg | ORAL_TABLET | Freq: Four times a day (QID) | ORAL | Status: DC | PRN
  Administered 2016-02-25: 500 mg via ORAL

## 2016-02-25 SURGICAL SUPPLY — 37 items
BLADE SAW RECIP 87.9 MT (BLADE) ×3 IMPLANT
BLADE SURG 21 STRL SS (BLADE) ×3 IMPLANT
BNDG COHESIVE 6X5 TAN STRL LF (GAUZE/BANDAGES/DRESSINGS) ×6 IMPLANT
BNDG GAUZE ELAST 4 BULKY (GAUZE/BANDAGES/DRESSINGS) ×6 IMPLANT
CANISTER WOUND CARE 500ML ATS (WOUND CARE) ×2 IMPLANT
COVER SURGICAL LIGHT HANDLE (MISCELLANEOUS) ×6 IMPLANT
CUFF TOURNIQUET SINGLE 34IN LL (TOURNIQUET CUFF) IMPLANT
CUFF TOURNIQUET SINGLE 44IN (TOURNIQUET CUFF) IMPLANT
DRAPE EXTREMITY T 121X128X90 (DRAPE) ×3 IMPLANT
DRAPE INCISE IOBAN 66X45 STRL (DRAPES) IMPLANT
DRAPE PROXIMA HALF (DRAPES) ×3 IMPLANT
DRAPE U-SHAPE 47X51 STRL (DRAPES) ×3 IMPLANT
DRSG ADAPTIC 3X8 NADH LF (GAUZE/BANDAGES/DRESSINGS) ×3 IMPLANT
DRSG PAD ABDOMINAL 8X10 ST (GAUZE/BANDAGES/DRESSINGS) ×3 IMPLANT
DURAPREP 26ML APPLICATOR (WOUND CARE) ×3 IMPLANT
ELECT REM PT RETURN 9FT ADLT (ELECTROSURGICAL) ×3
ELECTRODE REM PT RTRN 9FT ADLT (ELECTROSURGICAL) ×1 IMPLANT
GAUZE SPONGE 4X4 12PLY STRL (GAUZE/BANDAGES/DRESSINGS) ×3 IMPLANT
GLOVE BIOGEL PI IND STRL 9 (GLOVE) ×1 IMPLANT
GLOVE BIOGEL PI INDICATOR 9 (GLOVE) ×2
GLOVE SURG ORTHO 9.0 STRL STRW (GLOVE) ×3 IMPLANT
GOWN STRL REUS W/ TWL XL LVL3 (GOWN DISPOSABLE) ×2 IMPLANT
GOWN STRL REUS W/TWL XL LVL3 (GOWN DISPOSABLE) ×6
KIT BASIN OR (CUSTOM PROCEDURE TRAY) ×3 IMPLANT
KIT ROOM TURNOVER OR (KITS) ×3 IMPLANT
MANIFOLD NEPTUNE II (INSTRUMENTS) ×3 IMPLANT
NS IRRIG 1000ML POUR BTL (IV SOLUTION) ×3 IMPLANT
PACK GENERAL/GYN (CUSTOM PROCEDURE TRAY) ×3 IMPLANT
PAD ARMBOARD 7.5X6 YLW CONV (MISCELLANEOUS) ×3 IMPLANT
PREVENA INCISION MGT 90 150 (MISCELLANEOUS) ×2 IMPLANT
SPONGE LAP 18X18 X RAY DECT (DISPOSABLE) IMPLANT
STAPLER VISISTAT 35W (STAPLE) IMPLANT
STOCKINETTE IMPERVIOUS LG (DRAPES) ×3 IMPLANT
SUT SILK 2 0 (SUTURE) ×3
SUT SILK 2-0 18XBRD TIE 12 (SUTURE) ×1 IMPLANT
SUT VIC AB 1 CTX 27 (SUTURE) IMPLANT
TOWEL OR 17X26 10 PK STRL BLUE (TOWEL DISPOSABLE) ×3 IMPLANT

## 2016-02-25 NOTE — Anesthesia Procedure Notes (Signed)
Procedure Name: LMA Insertion Date/Time: 02/25/2016 2:34 PM Performed by: Daiva Eves Pre-anesthesia Checklist: Patient identified, Emergency Drugs available, Suction available, Patient being monitored and Timeout performed Patient Re-evaluated:Patient Re-evaluated prior to inductionOxygen Delivery Method: Circle system utilized Preoxygenation: Pre-oxygenation with 100% oxygen Intubation Type: IV induction LMA: LMA inserted LMA Size: 5.0 Number of attempts: 1 Placement Confirmation: positive ETCO2,  CO2 detector and breath sounds checked- equal and bilateral Tube secured with: Tape Dental Injury: Teeth and Oropharynx as per pre-operative assessment

## 2016-02-25 NOTE — Transfer of Care (Signed)
Immediate Anesthesia Transfer of Care Note  Patient: Ryan [name]  Procedure(s) Performed: Procedure(s): RIGHT BELOW KNEE AMPUTATION (Right)  Patient Location: PACU  Anesthesia Type:General  Level of Consciousness: awake, alert  and oriented  Airway & Oxygen Therapy: Patient Spontanous Breathing and Patient connected to face mask oxygen  Post-op Assessment: Report given to RN and Post -op Vital signs reviewed and stable  Post vital signs: Reviewed and stable  Last Vitals:  Vitals:   02/25/16 1148 02/25/16 1150  BP: 123/77   Pulse: 65   Resp: 20   Temp:  36.6 C    Last Pain:  Vitals:   02/25/16 1148  TempSrc: Oral      Patients Stated Pain Goal: 3 (02/25/16 1201)  Complications: No apparent anesthesia complications

## 2016-02-25 NOTE — Significant Event (Addendum)
Rapid Response Event Note  Overview:  Called by RN for patient with shaking, diaphoretic and low rectal temp Time Called: 1901 Arrival Time: 1905 Event Type: Other (Comment)  Initial Focused Assessment:  Upon my arrival to patients room, RN at bedside.  Patient with blankets on.  Per RN patient c/o feeling tired, shaking and diaphoretic.  CBG checked 189.  150/62, HR 67, SPo2 100% on RA, RR 16, rectal temp noted to be 93.0   Interventions:  Recommend to put warm blankets son him and call MD   Plan of Care (if not transferred):  monitor  Event Summary:  Orders received, RN to call if assistance needed   at      at          [facility], [place]es Amos

## 2016-02-25 NOTE — Progress Notes (Signed)
Plan for discharge to skilled nursing facility. Anticipate discharged possibly Tuesday.

## 2016-02-25 NOTE — H&P (Signed)
Ryan [name] is an 55 y.o. male.   Chief Complaint: Gangrene infection right lower extremity HPI: Patient is a 55 year old gentleman diabetic insensate neuropathy peripheral vascular disease who is status post a left transtibial amputation as well as a right great toe amputation patient presents at this time with progressive infection osteomyelitis to the right lower extremity he has failed conservative wound care and presents at this time for right transtibial amputation.  Past Medical History:  Diagnosis Date  . Anemia    low iron  . Arthritis   . Diabetes mellitus without complication (HCC)    type 2  . Diabetic neuropathy (HCC)   . Diabetic neuropathy (HCC)   . Headache    migraines as a child  . Heart murmur    was told "not to worry about it"  . Hypertension   . Osteomyelitis of foot (HCC)   . Peripheral vascular disease (HCC)    "poor circulation" in feet and necrosis    Past Surgical History:  Procedure Laterality Date  . AMPUTATION Right 01/13/2014   Procedure: Right great toe amputation;  Surgeon: Jacki Cones, MD;  Location: [facility];  Service: Orthopedics;  Laterality: Right;  . AMPUTATION Left 06/08/2014   Procedure: AMPUTATION LEFT GREAT TOE;  Surgeon: Verlee Rossetti, MD;  Location: [facility];  Service: Orthopedics;  Laterality: Left;  . AMPUTATION Left 07/01/2014   Procedure: LEFT First Metatarsal RAY AMPUTATION ;  Surgeon: Verlee Rossetti, MD;  Location: [facility] OR;  Service: Orthopedics;  Laterality: Left;  . AMPUTATION Left 08/28/2014   Procedure: Left Foot Fifth ray resection;  Surgeon: Kathryne Hitch, MD;  Location: [facility];  Service: Orthopedics;  Laterality: Left;  . AMPUTATION Left 10/08/2014   Procedure: LEFT FOOT TRANSMETATARSAL AMPUTATION;  Surgeon: Kathryne Hitch, MD;  Location: [facility] OR;  Service: Orthopedics;  Laterality: Left;  . AMPUTATION Left 12/04/2014   Procedure: AMPUTATION BELOW KNEE;  Surgeon: Nadara Mustard, MD;  Location: [facility] OR;  Service:  Orthopedics;  Laterality: Left;  . arm surgery Left    due to broken arm  . KNEE SURGERY Right     Family History  Problem Relation Age of Onset  . Family history unknown: Yes   Social History:  reports that he has never smoked. His smokeless tobacco use includes Snuff. He reports that he does not drink alcohol or use drugs.  Allergies:  Allergies  Allergen Reactions  . Metformin And Related Other (See Comments)    Pt states that metformin made sugars fall into 30's ? Needs dosage/schedule adjustment ?    No prescriptions prior to admission.    No results found for this or any previous visit (from the past 48 hour(s)). No results found.  Review of Systems  All other systems reviewed and are negative.   There were no vitals taken for this visit. Physical Exam  Patient has ischemic changes osteomyelitis and infection right lower extremity Assessment/Plan Assessment: Ischemia infection osteomyelitis right lower extremity.  Plan: We'll plan for right transtibial amputation risk and benefits were discussed including risk of the wound not healing need for higher level amputation. Patient states he understands wishes to proceed at this time.  Nadara Mustard, MD 02/25/2016, 6:28 AM

## 2016-02-25 NOTE — Op Note (Signed)
   Date of Surgery: 02/25/2016  INDICATIONS: Mr. Colaluca is a 55 y.o.-year-old male who presents with gangrene of the right forefoot. He has failed conservative care and presents at this time for right transtibial amputation.Marland Kitchen  PREOPERATIVE DIAGNOSIS: Gangrene osteomyelitis right forefoot  POSTOPERATIVE DIAGNOSIS: Same.  PROCEDURE: Transtibial amputation Application of Prevena wound VAC  SURGEON: Lajoyce Corners, M.D.  ANESTHESIA:  general  IV FLUIDS AND URINE: See anesthesia.  ESTIMATED BLOOD LOSS: Minimal mL.  COMPLICATIONS: None.  DESCRIPTION OF PROCEDURE: The patient was brought to the operating room and underwent a general anesthetic. After adequate levels of anesthesia were obtained patient's lower extremity was prepped using DuraPrep draped into a sterile field. A timeout was called. The foot was draped out of the sterile field with impervious stockinette. A transverse incision was made 11 cm distal to the tibial tubercle. This curved proximally and a large posterior flap was created. The tibia was transected 1 cm proximal to the skin incision. The fibula was transected just proximal to the tibial incision. The tibia was beveled anteriorly. A large posterior flap was created. The sciatic nerve was pulled cut and allowed to retract. The vascular bundles were suture ligated with 2-0 silk. The deep and superficial fascial layers were closed using #1 Vicryl. The skin was closed using staples and 2-0 nylon. The wound was covered with a Prevena wound VAC. There was a good suction fit. Patient was extubated taken to the PACU in stable condition.  Aldean Baker, MD [facility] 3:01 PM

## 2016-02-25 NOTE — Anesthesia Preprocedure Evaluation (Signed)
Anesthesia Evaluation  Patient identified by MRN, date of birth, ID band Patient awake    Reviewed: Allergy & Precautions, NPO status , Patient's Chart, lab work & pertinent test results  History of Anesthesia Complications Negative for: history of anesthetic complications  Airway Mallampati: II  TM Distance: >3 FB Neck ROM: Full    Dental no notable dental hx. (+) Teeth Intact   Pulmonary neg pulmonary ROS,    Pulmonary exam normal        Cardiovascular hypertension, Pt. on medications (-) angina+ Peripheral Vascular Disease  (-) Past MI and (-) CHF Normal cardiovascular exam Rhythm:Regular     Neuro/Psych negative psych ROS   GI/Hepatic negative GI ROS, Neg liver ROS,   Endo/Other  diabetes, Type 2, Insulin Dependent  Renal/GU negative Renal ROS     Musculoskeletal  (+) Arthritis ,   Abdominal Normal abdominal exam  (+)   Peds  Hematology   Anesthesia Other Findings   Reproductive/Obstetrics                             Anesthesia Physical  Anesthesia Plan  ASA: III  Anesthesia Plan: General   Post-op Pain Management:    Induction: Intravenous  Airway Management Planned: LMA  Additional Equipment: None  Intra-op Plan:   Post-operative Plan:   Informed Consent: I have reviewed the patients History and Physical, chart, labs and discussed the procedure including the risks, benefits and alternatives for the proposed anesthesia with the patient or authorized representative who has indicated his/her understanding and acceptance.   Dental advisory given  Plan Discussed with: Surgeon and CRNA  Anesthesia Plan Comments:         Anesthesia Quick Evaluation

## 2016-02-26 LAB — GLUCOSE, CAPILLARY
GLUCOSE-CAPILLARY: 115 mg/dL — AB (ref 65–99)
GLUCOSE-CAPILLARY: 127 mg/dL — AB (ref 65–99)
GLUCOSE-CAPILLARY: 85 mg/dL (ref 65–99)
Glucose-Capillary: 77 mg/dL (ref 65–99)
Glucose-Capillary: 101 mg/dL — ABNORMAL HIGH (ref 65–99)

## 2016-02-26 NOTE — Evaluation (Signed)
Physical Therapy Evaluation Patient Details Name: Ryan [name] MRN: [id number] DOB: 07-02-1961 Today's Date: 02/26/2016   History of Present Illness  pt presents post R BKA.  pt with hx of L BKA, Anemia, DM, Neuropathy, HTN, and PVD.    Clinical Impression  Pt impulsive with mobility and needs frequent re-direction to task, but no cognitive deficits indicated in PMH.  Pt has poor awareness of safety and at one point attempted to come to stand when PT mentioned transfer and pt attempted to stand with his prosthetic L foot on the power box on the wound vac cord.  Strong pt ed about need to slow down and attend to task and safety.  Pt also attempting to weightbear on R knee and residual limb.  Strong pt ed about NWBing status and that he can clarify with MD if he will be allowed to weightbear on R knee.  Pt needs SNF level of care and therapies to maximize pt's safety and continue therapy.      Follow Up Recommendations SNF    Equipment Recommendations  None recommended by PT    Recommendations for Other Services       Precautions / Restrictions Precautions Precautions: Fall Precaution Comments: pt with L prosthetic LE.   Restrictions Weight Bearing Restrictions: Yes RLE Weight Bearing: Non weight bearing      Mobility  Bed Mobility               General bed mobility comments: pt sitting EOB on arrival.    Transfers Overall transfer level: Needs assistance Equipment used: Crutches Transfers: Sit to/from [employer] Sit to Stand: Min assist Stand pivot transfers: Min assist       General transfer comment: pt needs cues for attending to task as pt talks to himself and moves the crutches from one hand to the other and occasionally one in each hand when preparing to come to stand.  pt ed on placing crutches in one hand and use of other UE on bed rail or armrest of chair.  pt with uncontrolled descent to sitting.  Repeated coming to standing x4.  During 2  of transfers to standing pt attempting to weightbear on R knee on recliner seat and needs max cueing about NWBing status.    Ambulation/Gait Ambulation/Gait assistance: Min assist Ambulation Distance (Feet): 12 Feet Assistive device: Crutches Gait Pattern/deviations: Step-to pattern     General Gait Details: pt somewhat unsteady and at times takes big hops with L LE on crutches and smaller steps other times.  pt needs cues to attend to wound vac cord and balance now without R LE.  Stairs            Wheelchair Mobility    Modified Rankin (Stroke Patients Only)       Balance Overall balance assessment: Needs assistance Sitting-balance support: No upper extremity supported;Feet supported Sitting balance-Leahy Scale: Good     Standing balance support: Bilateral upper extremity supported;During functional activity Standing balance-Leahy Scale: Poor                               Pertinent Vitals/Pain Pain Assessment: No/denies pain    Home Living Family/patient expects to be discharged to:: Skilled nursing facility                      Prior Function Level of Independence: Independent with assistive device(s)  Comments: pt indicates he uses his crutches, though doesn't always use them in the house.       Hand Dominance        Extremity/Trunk Assessment   Upper Extremity Assessment: Overall WFL for tasks assessed           Lower Extremity Assessment: RLE deficits/detail;LLE deficits/detail;Difficult to assess due to impaired cognition RLE Deficits / Details: Knee/hip ROM WFL.  Strength limited post-op.   LLE Deficits / Details: Old L BKA with strength and ROM WFL.    Cervical / Trunk Assessment: Normal  Communication   Communication: No difficulties  Cognition Arousal/Alertness: Awake/alert Behavior During Therapy: Impulsive Overall Cognitive Status: No family/caregiver present to determine baseline cognitive  functioning Area of Impairment: Attention;Following commands;Safety/judgement;Awareness;Problem solving   Current Attention Level: Selective   Following Commands: Follows one step commands inconsistently;Follows multi-step commands inconsistently Safety/Judgement: Decreased awareness of safety;Decreased awareness of deficits Awareness: Emergent Problem Solving: Slow processing;Decreased initiation;Difficulty sequencing;Requires verbal cues;Requires tactile cues General Comments: pt needs frequent re-direction to task/conversation.  pt discussing previous L BKA when PT is asking information about new R BKA.  pt impulsive and when discussing standing pt tries to come to standing with his prosthetic LE on top of the power box on the wound vac cord.      General Comments      Exercises Amputee Exercises Hip Flexion/Marching: AROM;Both;10 reps Knee Flexion: AROM;Right;10 reps Knee Extension: AROM;Right;10 reps      Assessment/Plan    PT Assessment Patient needs continued PT services  PT Diagnosis Difficulty walking   PT Problem List Decreased strength;Decreased activity tolerance;Decreased balance;Decreased mobility;Decreased cognition;Decreased knowledge of use of DME;Decreased safety awareness  PT Treatment Interventions DME instruction;Gait training;Functional mobility training;Therapeutic activities;Therapeutic exercise;Balance training;Patient/family education   PT Goals (Current goals can be found in the Care Plan section) Acute Rehab PT Goals Patient Stated Goal: Walk PT Goal Formulation: With patient Time For Goal Achievement: 03/04/16 Potential to Achieve Goals: Good    Frequency Min 3X/week   Barriers to discharge        Co-evaluation               End of Session Equipment Utilized During Treatment: Gait belt Activity Tolerance: Patient tolerated treatment well Patient left: in chair;with call bell/phone within reach;with chair alarm set Nurse Communication:  Mobility status (Impulsivity)         Time: 1610-9604 PT Time Calculation (min) (ACUTE ONLY): 37 min   Charges:   PT Evaluation $PT Eval Moderate Complexity: 1 Procedure PT Treatments $Gait Training: 8-22 mins   PT G CodesSunny Schlein, [place] [phone] 02/26/2016, 2:41 PM

## 2016-02-26 NOTE — Progress Notes (Signed)
Subjective: 1 Day Post-Op Procedure(s) (LRB): RIGHT BELOW KNEE AMPUTATION (Right) Patient reports pain as mild.  Awake and alert.  No chills.  Objective: Vital signs in last 24 hours: Temp:  [93.6 F (34.2 C)-98.3 F (36.8 C)] 98.3 F (36.8 C) (07/29 0547) Pulse Rate:  [63-83] 80 (07/29 0547) Resp:  [11-20] 18 (07/29 0547) BP: (123-166)/(56-86) 124/61 (07/29 0547) SpO2:  [95 %-100 %] 100 % (07/29 0547) Weight:  [77.6 kg (171 lb)] 77.6 kg (171 lb) (07/28 1148)  Intake/Output from previous day: 07/28 0701 - 07/29 0700 In: 1100 [P.O.:80; I.V.:1020] Out: 80 [Drains:30; Blood:50] Intake/Output this shift: No intake/output data recorded.   Recent Labs  02/25/16 1158  HGB 11.9*    Recent Labs  02/25/16 1158  WBC 10.2  RBC 3.87*  HCT 35.6*  PLT 212    Recent Labs  02/25/16 1158  NA 137  K 4.2  CL 106  CO2 23  BUN 17  CREATININE 0.79  GLUCOSE 123*  CALCIUM 9.0   No results for input(s): LABPT, INR in the last 72 hours.  Incision: dressing C/D/I VAC with a good seal on right BKA stump   Assessment/Plan: 1 Day Post-Op Procedure(s) (LRB): RIGHT BELOW KNEE AMPUTATION (Right) Up with therapy Discharge to SNF early next week  Zoe Nordin Y 02/26/2016, 8:56 AM

## 2016-02-27 LAB — GLUCOSE, CAPILLARY
GLUCOSE-CAPILLARY: 120 mg/dL — AB (ref 65–99)
Glucose-Capillary: 88 mg/dL (ref 65–99)
Glucose-Capillary: 100 mg/dL — ABNORMAL HIGH (ref 65–99)
Glucose-Capillary: 124 mg/dL — ABNORMAL HIGH (ref 65–99)

## 2016-02-27 NOTE — Progress Notes (Signed)
Patient ID: Ryan [name], male   DOB: 07/04/1961, 55 y.o.   MRN: [id number] No acute changes.  Vitals stable.  Pain well managed.  VAC with good seal over right BKA incision.

## 2016-02-27 NOTE — Progress Notes (Signed)
Pt was able to get up to BR and voided 1000cc. Catheter not placed at this time.

## 2016-02-28 ENCOUNTER — Encounter (HOSPITAL_COMMUNITY): Payer: Self-pay | Admitting: Orthopedic Surgery

## 2016-02-28 LAB — GLUCOSE, CAPILLARY
GLUCOSE-CAPILLARY: 100 mg/dL — AB (ref 65–99)
GLUCOSE-CAPILLARY: 112 mg/dL — AB (ref 65–99)

## 2016-02-28 MED ORDER — OXYCODONE-ACETAMINOPHEN 5-325 MG PO TABS: 1.0000 | ORAL_TABLET | ORAL | 0 refills | Status: DC | PRN

## 2016-02-28 NOTE — Progress Notes (Signed)
Patient ID: Ryan [name], male   DOB: 1961/06/21, 55 y.o.   MRN: [id number] Patient's wound VAC has minimal drainage right transtibial amputation. Anticipate discharge to skilled nursing facility. Patient now has [employer]. Discharge patient with portable Prevena wound VAC that we will be disposed of when it stops working. No outpatient wound VAC changes necessary.

## 2016-02-28 NOTE — NC FL2 (Signed)
[place] MEDICAID FL2 LEVEL OF CARE SCREENING TOOL     IDENTIFICATION  Patient Name: Ryan [name] Birthdate: [birth year] Sex: male Admission Date (Current Location): 02/25/2016  [facility] and [place] Number:  Producer, television/film/video and Address:  [facility] [place], [address] [place], [place] [address]      Provider Number: [id number]  Attending Physician Name and Address:  Nadara Mustard, MD  Relative Name and Phone Number:       Current Level of Care: Hospital Recommended Level of Care: Skilled Nursing Facility Prior Approval Number:    Date Approved/Denied:   PASRR Number: [id number]  Discharge Plan: SNF    Current Diagnoses: Patient Active Problem List   Diagnosis Date Noted  . Below knee amputation status (HCC) 02/25/2016  . 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 H/O osteomyelitis 01/14/2016  . Type 2 diabetes mellitus with complication, without long-term current use of insulin (HCC) 10/14/2015  . Dyslipidemia (high LDL; low HDL) 10/14/2015  . S/P BKA (below knee amputation) unilateral (HCC) 12/04/2014  . S/P transmetatarsal amputation of foot (HCC) 10/08/2014  . Essential hypertension 06/23/2014  . Diabetic foot (HCC) 05/07/2014    Orientation RESPIRATION BLADDER Height & Weight     Self, Time, Situation, Place  Normal Continent Weight: 171 lb (77.6 kg) Height:   (180.3 cm)  BEHAVIORAL SYMPTOMS/MOOD NEUROLOGICAL BOWEL NUTRITION STATUS      Continent Diet (Please see discharge summary.)  AMBULATORY STATUS COMMUNICATION OF NEEDS Skin   Limited Assist Verbally Surgical wounds, Wound Vac (Prevena woundvac)                       Personal Care Assistance Level of Assistance  Bathing, Feeding, Dressing Bathing Assistance: Limited assistance Feeding assistance: Independent Dressing Assistance: Limited assistance     Functional Limitations Info             SPECIAL CARE  FACTORS FREQUENCY  PT (By licensed PT), OT (By licensed OT)     PT Frequency: 5 OT Frequency: 5            Contractures      Additional Factors Info  Allergies, Code Status Code Status Info: FULL Allergies Info: Metformin And Related           Current Medications (02/28/2016):  This is the current hospital active medication list Current Facility-Administered Medications  Medication Dose Route Frequency Provider Last Rate Last Dose  . 0.9 %  sodium chloride infusion   Intravenous Continuous Nadara Mustard, MD 10 mL/hr at 02/25/16 1527    . 0.9 %  sodium chloride infusion   Intravenous Continuous Kathryne Hitch, MD 75 mL/hr at 02/25/16 1945    . acetaminophen (TYLENOL) tablet 650 mg  650 mg Oral Q6H PRN Nadara Mustard, MD       Or  . acetaminophen (TYLENOL) suppository 650 mg  650 mg Rectal Q6H PRN Nadara Mustard, MD      . aspirin EC tablet 325 mg  325 mg Oral Daily Nadara Mustard, MD   325 mg at 02/28/16 0834  . HYDROmorphone (DILAUDID) injection 1 mg  1 mg Intravenous Q2H PRN Nadara Mustard, MD      . insulin aspart (novoLOG) injection 0-15 Units  0-15 Units Subcutaneous TID WC Nadara Mustard, MD   2 Units at 02/27/16 1738  . insulin aspart (novoLOG) injection 4 Units  4 Units Subcutaneous TID WC Nadara Mustard, MD   4 Units at 02/28/16 [phone]  . methocarbamol (ROBAXIN) tablet 500 mg  500 mg Oral Q6H PRN Nadara Mustard, MD   500 mg at 02/25/16 1523   Or  . methocarbamol (ROBAXIN) 500 mg in dextrose 5 % 50 mL IVPB  500 mg Intravenous Q6H PRN Nadara Mustard, MD      . metoCLOPramide (REGLAN) tablet 5-10 mg  5-10 mg Oral Q8H PRN Nadara Mustard, MD       Or  . metoCLOPramide (REGLAN) injection 5-10 mg  5-10 mg Intravenous Q8H PRN Nadara Mustard, MD   10 mg at 02/25/16 1811  . ondansetron (ZOFRAN) tablet 4 mg  4 mg Oral Q6H PRN Nadara Mustard, MD       Or  . ondansetron [facility]) injection 4 mg  4 mg Intravenous Q6H PRN Nadara Mustard, MD      . oxyCODONE (Oxy IR/ROXICODONE) immediate  release tablet 5-10 mg  5-10 mg Oral Q3H PRN Nadara Mustard, MD   10 mg at 02/25/16 1538     Discharge Medications: Please see discharge summary for a list of discharge medications.  Relevant Imaging Results:  Relevant Lab Results:   Additional Information SSN: [id number]  Rod Mae, LCSW

## 2016-02-28 NOTE — Progress Notes (Signed)
Report called to Risa Grill at [facility]. All questions/concerns addressed. IV removed and belongings gathered. Pt will be transported out via [facility]. Will continue to monitor

## 2016-02-28 NOTE — Clinical Social Work Placement (Signed)
   CLINICAL SOCIAL WORK PLACEMENT  NOTE  Date:  02/28/2016  Patient Details  Name: Ryan [name] MRN: [id number] Date of Birth: 1961-03-22  Clinical Social Work is seeking post-discharge placement for this patient at the Skilled  Nursing Facility level of care (*CSW will initial, date and re-position this form in  chart as items are completed):  Yes   Patient/family provided with [place] Clinical Social Work Department's list of facilities offering this level of care within the geographic area requested by the patient (or if unable, by the patient's family).  Yes   Patient/family informed of their freedom to choose among providers that offer the needed level of care, that participate in Medicare, Medicaid or managed care program needed by the patient, have an available bed and are willing to accept the patient.  Yes   Patient/family informed of 's ownership interest in [facility] and [facility], as well as of the fact that they are under no obligation to receive care at these facilities.  PASRR submitted to EDS on       PASRR number received on       Existing PASRR number confirmed on 02/28/16     FL2 transmitted to all facilities in geographic area requested by pt/family on 02/28/16     FL2 transmitted to all facilities within larger geographic area on       Patient informed that his/her managed care company has contracts with or will negotiate with certain facilities, including the following:        Yes   Patient/family informed of bed offers received.  Patient chooses bed at [facility]     Physician recommends and patient chooses bed at      Patient to be transferred to [facility] on 02/28/16.  Patient to be transferred to facility by [facility]     Patient family notified on 02/28/16 of transfer.  Name of family member notified:  Patient     PHYSICIAN       Additional Comment:     _______________________________________________ Rod Mae, LCSW 02/28/2016, 12:51 PM

## 2016-02-28 NOTE — Clinical Social Work Note (Signed)
Clinical Social Work Assessment  Patient Details  Name: Ryan [name] MRN: [id number] Date of Birth: 03/16/1961  Date of referral:  02/28/16               Reason for consult:  Discharge Planning                Permission sought to share information with:  Chartered certified accountant granted to share information::  Yes, Verbal Permission Granted  Name::        Agency::  [facility] SNFs  Relationship::     Contact Information:     Housing/Transportation Living arrangements for the past 2 months:  [place] of Information:  Patient Patient Interpreter Needed:  None Criminal Activity/Legal Involvement Pertinent to Current Situation/Hospitalization:  No - Comment as needed Significant Relationships:  None Lives with:  Self Do you feel safe going back to the place where you live?  Yes Need for family participation in patient care:  No (Coment) (Patient able to make own decisions.)  Care giving concerns:  Patient expressed no concerns at this time.   Social Worker assessment / plan:  LCSW received referral for possible SNF placement at this time. LCSW met with patient at bedside to discuss discharge planning needs. Per patient, patient has previously completed short-term rehabilitation at a SNF in [place], [place]. Patient was pleasant and agreeable to SNF placement. Patient expressed desire to begin therapies and commented on doing his own therapies while resting in bed. LCSW to continue to follow and assist with discharge planning needs.  Employment status:  Retired Forensic scientist:  Medicaid In [place] PT Recommendations:  [place] / Referral to community resources:  [place]  Patient/Family's Response to care:  Patient understanding and agreeable to [employer] of care.  Patient/Family's Understanding of and Emotional Response to Diagnosis, Current Treatment, and Prognosis:  Patient has previously  undergone a L BKA and is familiar with the course of treatment. Patient expressed a positive attitude towards patient's medical condition.  Emotional Assessment Appearance:  Appears stated age Attitude/Demeanor/Rapport:  Other (Appropriate.) Affect (typically observed):  Accepting, Pleasant, Appropriate Orientation:  Oriented to Self, Oriented to Place, Oriented to  Time, Oriented to Situation Alcohol / Substance use:  Not Applicable Psych involvement (Current and /or in the community):  No (Comment) (Not appropriate on this admission.)  Discharge Needs  Concerns to be addressed:  No discharge needs identified Readmission within the last 30 days:  No Current discharge risk:  None Barriers to Discharge:  No Barriers Identified   Caroline Sauger, LCSW 02/28/2016, 11:55 AM [phone]

## 2016-02-28 NOTE — Progress Notes (Signed)
   02/28/16 0915  Clinical Encounter Type  Visited With Patient  Visit Type Initial;Spiritual support  Spiritual Encounters  Spiritual Needs Prayer;Emotional  Stress Factors  Patient Stress Factors Other (Comment)  Family Stress Factors Other (Comment)  Chaplain visited with patient on morning rounds.  Patient sitting upright in chair.  Patient shared he had a second leg amputation on Friday (below knee). Patient was positive and upbeat.  Patient shared his struggles in trying to find employment as an amputee.  Patient shared he would like to be a part of a team to support amputees. Patient express his joy of working.  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   He shared how difficult it was for him emotionally  last year with his initial amputation.  Patient demonstrates how he puts on and takes off his prosthetic and the process before getting a new one for the most recent amputation.  Chaplain prayed with patient and offered to investigate if there is a support group at the hospital for amputees.

## 2016-02-28 NOTE — Discharge Summary (Signed)
Physician Discharge Summary  Patient ID: Ryan [name] MRN: [id number] DOB/AGE: 1960-11-30 55 y.o.  Admit date: 02/25/2016 Discharge date: 02/28/2016  Admission Diagnoses:abscess, osteomyelitis right leg  Discharge Diagnoses:  Active Problems:   Below knee amputation status ([place])   Discharged Condition: stable  Hospital Course: patient underwent right BKA, post operative he progressed well and was discharged to SNF  Consults: None  Significant Diagnostic Studies: labs: routine  Treatments: surgery: see op note  Discharge Exam: Blood pressure (!) 142/110, pulse 85, temperature 98.2 F (36.8 C), temperature source Oral, resp. rate 18, height 5' 11"  (1.803 m), weight 77.6 kg (171 lb), SpO2 99 %. Incision/Wound:wound VAC functioning well  Disposition: 03-Skilled Nursing Facility     Medication List    ASK your doctor about these medications   ACCU-CHEK AVIVA PLUS w/Device Kit 1 each by Does not apply route 2 (two) times daily.   accu-chek softclix lancets Use as instructed   Gauze Dressing 4"X4" Pads 4 each by Does not apply route daily. ICD 11 E11.621   Gauze Bandage 3" Misc 1 each by Does not apply route daily. ICD 10 E11.621   glucose blood test strip Commonly known as:  ACCU-CHEK AVIVA PLUS Use as instructed   ibuprofen 200 MG tablet Commonly known as:  ADVIL,MOTRIN Take 400 mg by mouth every 6 (six) hours as needed for fever (pain).   oxyCODONE-acetaminophen 5-325 MG tablet Commonly known as:  PERCOCET/ROXICET Take 1-2 tablets by mouth every 4 (four) hours as needed for severe pain. Reported on 01/14/2016   pravastatin 40 MG tablet Commonly known as:  PRAVACHOL Take 1 tablet (40 mg total) by mouth daily.   vitamin B-12 100 MCG tablet Commonly known as:  CYANOCOBALAMIN Take 100 mcg by mouth daily.      Follow-up Information    Kort Stettler V, MD Follow up in 1 week(s).   Specialty:  Orthopedic Surgery Contact information: [place] [address] [phone]           Signed: Newt Minion 02/28/2016, 11:51 AM

## 2016-02-28 NOTE — Progress Notes (Signed)
Physical Therapy Treatment Patient Details Name: Ryan [name] MRN: [id number] DOB: Feb 10, 1961 Today's Date: 02/28/2016    History of Present Illness pt presents post R BKA.  pt with hx of L BKA, Anemia, DM, Neuropathy, HTN, and PVD.      PT Comments    Pt performed increased mobility and presents with improved safety with use of RW.  Plans remains for d/c to SNF at this time.  Will continue to follow patient during acute hospitalization.    Follow Up Recommendations  SNF     Equipment Recommendations   (TBD at SNF.  )    Recommendations for Other Services       Precautions / Restrictions Precautions Precautions: Fall Precaution Comments: pt with L prosthetic LE.   Restrictions Weight Bearing Restrictions: Yes RLE Weight Bearing: Non weight bearing    Mobility  Bed Mobility               General bed mobility comments: Pt sitting in recliner on arrival.    Transfers Overall transfer level: Needs assistance Equipment used: Rolling walker (2 wheeled) Transfers: Sit to/from Stand Sit to Stand: Supervision         General transfer comment: Cues for hand placement and forward weight shifting.  Pt demonstrates good safety and improved technique from previous session.  Pt required cues to control descent to seated surface and presents with good carryover.    Ambulation/Gait Ambulation/Gait assistance: Min guard Ambulation Distance (Feet): 250 Feet Assistive device: Rolling walker (2 wheeled) Gait Pattern/deviations: Step-to pattern;Trunk flexed   Gait velocity interpretation: at or above normal speed for age/gender General Gait Details: Pt performed 50 ft before requiring rest break to improve RW height.  With adjustment to RW upper trunk control improved and patient gait speed improved.     Stairs            Wheelchair Mobility    Modified Rankin (Stroke Patients Only)       Balance     Sitting balance-Leahy Scale: Good       Standing  balance-Leahy Scale: Fair                      Cognition Arousal/Alertness: Awake/alert Behavior During Therapy: WFL for tasks assessed/performed Overall Cognitive Status: Within Functional Limits for tasks assessed                 General Comments: Pt followed commands during session and does not appear impulsive during tx.  Pt demonstrates good safety.      Exercises      General Comments        Pertinent Vitals/Pain Pain Assessment: 0-10 Pain Score: 5  Pain Location: lateral border of incision.   Pain Intervention(s): Monitored during session;Repositioned;Patient requesting pain meds-RN notified    Home Living                      Prior Function            PT Goals (current goals can now be found in the care plan section) Acute Rehab PT Goals Patient Stated Goal: Walk Potential to Achieve Goals: Good Progress towards PT goals: Progressing toward goals    Frequency  Min 3X/week    PT Plan Current plan remains appropriate    Co-evaluation             End of Session Equipment Utilized During Treatment: Gait belt Activity Tolerance: Patient tolerated treatment well Patient  left: in chair;with call bell/phone within reach;with chair alarm set     Time: 1610-9604 PT Time Calculation (min) (ACUTE ONLY): 26 min  Charges:  $Gait Training: 8-22 mins $Therapeutic Activity: 8-22 mins                    G Codes:      Florestine Avers 27-Mar-2016, 11:41 AM  Joycelyn Rua, PTA pager [phone]

## 2016-02-28 NOTE — Clinical Social Work Note (Signed)
Patient to be discharged to [facility]. Patient updated regarding discharge. Patient to be transported via EMS (1:30p at facility request). RN report number: [phone]  Marcelline Deist, LCSW [phone] Orthopedics: [phone] Surgical: [phone]

## 2016-02-29 ENCOUNTER — Encounter: Payer: Self-pay | Admitting: Adult Health

## 2016-02-29 ENCOUNTER — Non-Acute Institutional Stay (SKILLED_NURSING_FACILITY): Payer: Medicaid Other | Admitting: Adult Health

## 2016-02-29 DIAGNOSIS — E784 Other hyperlipidemia: Secondary | ICD-10-CM | POA: Diagnosis not present

## 2016-02-29 DIAGNOSIS — Z89512 Acquired absence of left leg below knee: Secondary | ICD-10-CM | POA: Diagnosis not present

## 2016-02-29 DIAGNOSIS — Z89511 Acquired absence of right leg below knee: Secondary | ICD-10-CM | POA: Diagnosis not present

## 2016-02-29 DIAGNOSIS — I10 Essential (primary) hypertension: Secondary | ICD-10-CM | POA: Diagnosis not present

## 2016-02-29 DIAGNOSIS — E118 Type 2 diabetes mellitus with unspecified complications: Secondary | ICD-10-CM

## 2016-02-29 DIAGNOSIS — E785 Hyperlipidemia, unspecified: Secondary | ICD-10-CM

## 2016-02-29 NOTE — Progress Notes (Signed)
Patient ID: Ryan [name], male   DOB: July 08, 1961, 55 y.o.   MRN: [id number]   Location:   [facility] Room Number: 121-B Place of Service:  SNF (31)   CODE STATUS: Full Code  Allergies  Allergen Reactions  . Metformin And Related Other (See Comments)    Pt states that metformin made sugars fall into 30's ? Needs dosage/schedule adjustment ?    Chief Complaint  Patient presents with  . Hospitalization Follow-up    Hospital follow up    HPI:  He has been hospitalized for right bka due to osteomyelitis. He is status post left bka in May of last year. He is here for short term rehab with his goal to return back home. 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 There are no nursing concerns at this time.    Past Medical History:  Diagnosis Date  . Anemia    low iron  . Arthritis   . Diabetes mellitus without complication (HCC)    type 2  . Diabetic neuropathy (HCC)   . Diabetic neuropathy (HCC)   . Headache    migraines as a child  . Heart murmur    was told "not to worry about it"  . Hypertension   . Osteomyelitis of foot (HCC)   . Peripheral vascular disease (HCC)    "poor circulation" in feet and necrosis    Past Surgical History:  Procedure Laterality Date  . AMPUTATION Right 01/13/2014   Procedure: Right great toe amputation;  Surgeon: Jacki Cones, MD;  Location: [facility];  Service: Orthopedics;  Laterality: Right;  . AMPUTATION Left 06/08/2014   Procedure: AMPUTATION LEFT GREAT TOE;  Surgeon: Verlee Rossetti, MD;  Location: [facility];  Service: Orthopedics;  Laterality: Left;  . AMPUTATION Left 07/01/2014   Procedure: LEFT First Metatarsal RAY AMPUTATION ;  Surgeon: Verlee Rossetti, MD;  Location: [facility] OR;  Service: Orthopedics;  Laterality: Left;  . AMPUTATION Left 08/28/2014   Procedure: Left Foot Fifth ray resection;  Surgeon: Kathryne Hitch, MD;  Location: [facility];  Service: Orthopedics;  Laterality: Left;  . AMPUTATION Left  10/08/2014   Procedure: LEFT FOOT TRANSMETATARSAL AMPUTATION;  Surgeon: Kathryne Hitch, MD;  Location: [facility] OR;  Service: Orthopedics;  Laterality: Left;  . AMPUTATION Left 12/04/2014   Procedure: AMPUTATION BELOW KNEE;  Surgeon: Nadara Mustard, MD;  Location: [facility] OR;  Service: Orthopedics;  Laterality: Left;  . AMPUTATION Right 02/25/2016   Procedure: RIGHT BELOW KNEE AMPUTATION;  Surgeon: Nadara Mustard, MD;  Location: [facility] OR;  Service: Orthopedics;  Laterality: Right;  . arm surgery Left    due to broken arm  . KNEE SURGERY Right     Social History   Social History  . Marital status: Single    Spouse name: N/A  . Number of children: N/A  . Years of education: N/A   Occupational History  . Not on file.   Social History Main Topics  . Smoking status: Never Smoker  . Smokeless tobacco: Current User    Types: Snuff  . Alcohol use No     Comment: occ   . Drug use: No  . Sexual activity: Not on file   Other Topics Concern  . Not on file   Social History Narrative  . No narrative on file   Family History  Problem Relation Age of Onset  . Family history unknown: Yes      VITAL SIGNS BP Marland Kitchen)  158/78   Pulse 79   Temp 98 F (36.7 C) (Oral)   Resp 20   Ht  (1.803 m)   Wt 166 lb (75.3 kg)   SpO2 97%   BMI 23.15 kg/m   Patient's Medications  New Prescriptions   No medications on file  Previous Medications   IBUPROFEN (ADVIL,MOTRIN) 200 MG TABLET    Take 400 mg by mouth every 6 (six) hours as needed for fever (pain).   OXYCODONE-ACETAMINOPHEN (PERCOCET/ROXICET) 5-325 MG TABLET    Take 1-2 tablets by mouth every 6 (six) hours as needed for severe pain.   PRAVASTATIN (PRAVACHOL) 40 MG TABLET    Take 1 tablet (40 mg total) by mouth daily.   VITAMIN B-12 (CYANOCOBALAMIN) 100 MCG TABLET    Take 100 mcg by mouth daily.  Modified Medications   No medications on file  Discontinued Medications     SIGNIFICANT DIAGNOSTIC EXAMS    LABS REVIEWED:   02-25-16: wbc  10.2; hgb 11.9; hct 35.6; mcv 92.0; plt 212; glucose 123; bun 17; creat 0.79; k+ 4.2; na++ 137     Review of Systems  Constitutional: Negative for malaise/fatigue.  Respiratory: Negative for cough and shortness of breath.   Cardiovascular: Negative for chest pain, palpitations and leg swelling.  Gastrointestinal: Negative for abdominal pain, constipation and heartburn.  Musculoskeletal: Negative for back pain, joint pain and myalgias.       Is status right bka   Skin: Negative.   Neurological: Negative for dizziness.  Psychiatric/Behavioral: The patient is not nervous/anxious.     Physical Exam  Constitutional: He is oriented to person, place, and time. No distress.  Eyes: Conjunctivae are normal.  Neck: Neck supple. No JVD present. No thyromegaly present.  Cardiovascular: Normal rate, regular rhythm and intact distal pulses.   Respiratory: Effort normal and breath sounds normal. No respiratory distress. He has no wheezes.  GI: Soft. Bowel sounds are normal. He exhibits no distension. There is no tenderness.  Musculoskeletal: He exhibits no edema.  Able to move all extremities  Is status post recent right bka Old left bka   Lymphadenopathy:    He has no cervical adenopathy.  Neurological: He is alert and oriented to person, place, and time.  Skin: Skin is warm and dry. He is not diaphoretic.  Has wound in place on right bka no obvious signs of infection present   Psychiatric: He has a normal mood and affect.    ASSESSMENT/ PLAN:  1. Dyslipidemia: will continue pravachol 40 mg daily  2. Status post right bka: will follow up with Dr. Lajoyce Corners as indicated; will continue therapy as directed; will continue current incisional wound care; will continue percocet 5/325 mg 1 ro 2 tabs every 6 hours as needed.   3. Diabetes: is diet controlled; will check cbg daily   4. Hypertension: is stable is not on medications; will monitor   Time spent with patient  50  minutes >50% time spent  counseling; reviewing medical record; tests; labs; and developing future plan of care   MD is aware of resident's narcotic use and is in agreement with current plan of care. We will attempt to wean resident as apropriate   Synthia Innocent NP [facility] Adult [facility]  Contact [phone] Monday through Friday 8am- 5pm  After hours call [phone]

## 2016-02-29 NOTE — Anesthesia Postprocedure Evaluation (Signed)
Anesthesia Post Note  Patient: Ryan [name]  Procedure(s) Performed: Procedure(s) (LRB): RIGHT BELOW KNEE AMPUTATION (Right)  Patient location during evaluation: PACU Anesthesia Type: General Level of consciousness: awake and alert Pain management: pain level controlled Vital Signs Assessment: post-procedure vital signs reviewed and stable Respiratory status: spontaneous breathing, nonlabored ventilation, respiratory function stable and patient connected to nasal cannula oxygen Cardiovascular status: blood pressure returned to baseline and stable Postop Assessment: no signs of nausea or vomiting Anesthetic complications: no    Last Vitals:  Vitals:   02/28/16 0743 02/28/16 1321  BP: (!) 142/110 (!) 157/70  Pulse: 85 74  Resp:  16  Temp: 36.8 C 37.1 C    Last Pain:  Vitals:   02/28/16 0832  TempSrc:   PainSc: 0-No pain                 Kennieth Rad

## 2016-03-02 ENCOUNTER — Encounter: Payer: Self-pay | Admitting: Internal Medicine

## 2016-03-02 NOTE — Progress Notes (Signed)
MRN: [id number] Name: Ryan [name]  Sex: male Age: 55 y.o. DOB: February 23, 1961  PSC #:  Facility/Room: [facility] / 121 B Level Of Care: SNF Provider: Randon Goldsmith. Lyn Hollingshead, MD Emergency Contacts: Extended Emergency Contact Information Primary Emergency Contact: Martin,Roger  [place] Home Phone: [phone] Relation: Friend Secondary Emergency Contact: Charissa [place] Home Phone: [phone] Relation: Friend  Code Status: Full Code  Allergies: Metformin and related  Chief Complaint  Patient presents with  . New Admit To SNF    Admit to Facility    HPI: Patient is 55 y.o. male who  Past Medical History:  Diagnosis Date  . Anemia    low iron  . Arthritis   . Diabetes mellitus without complication (HCC)    type 2  . Diabetic neuropathy (HCC)   . Diabetic neuropathy (HCC)   . Headache    migraines as a child  . Heart murmur    was told "not to worry about it"  . Hypertension   . Osteomyelitis of foot (HCC)   . Peripheral vascular disease (HCC)    "poor circulation" in feet and necrosis    Past Surgical History:  Procedure Laterality Date  . AMPUTATION Right 01/13/2014   Procedure: Right great toe amputation;  Surgeon: Jacki Cones, MD;  Location: [facility];  Service: Orthopedics;  Laterality: Right;  . AMPUTATION Left 06/08/2014   Procedure: AMPUTATION LEFT GREAT TOE;  Surgeon: Verlee Rossetti, MD;  Location: [facility];  Service: Orthopedics;  Laterality: Left;  . AMPUTATION Left 07/01/2014   Procedure: LEFT First Metatarsal RAY AMPUTATION ;  Surgeon: Verlee Rossetti, MD;  Location: [facility] OR;  Service: Orthopedics;  Laterality: Left;  . AMPUTATION Left 08/28/2014   Procedure: Left Foot Fifth ray resection;  Surgeon: Kathryne Hitch, MD;  Location: [facility];  Service: Orthopedics;  Laterality: Left;  . AMPUTATION Left 10/08/2014   Procedure: LEFT FOOT TRANSMETATARSAL AMPUTATION;  Surgeon: Kathryne Hitch, MD;  Location:  [facility] OR;  Service: Orthopedics;  Laterality: Left;  . AMPUTATION Left 12/04/2014   Procedure: AMPUTATION BELOW KNEE;  Surgeon: Nadara Mustard, MD;  Location: [facility] OR;  Service: Orthopedics;  Laterality: Left;  . AMPUTATION Right 02/25/2016   Procedure: RIGHT BELOW KNEE AMPUTATION;  Surgeon: Nadara Mustard, MD;  Location: [facility] OR;  Service: Orthopedics;  Laterality: Right;  . arm surgery Left    due to broken arm  . KNEE SURGERY Right       Medication List       Accurate as of 03/02/16 11:59 PM. Always use your most recent med list.          ACCU-CHEK SOFTCLIX LANCETS lancets 1 each by Other route 2 (two) times daily. Use as instructed   glucose blood test strip 1 each by Other route as needed for other. Use as instructed   ibuprofen 200 MG tablet Commonly known as:  ADVIL,MOTRIN Take 400 mg by mouth every 6 (six) hours as needed for fever (pain).   oxyCODONE-acetaminophen 5-325 MG tablet Commonly known as:  PERCOCET/ROXICET Take 1-2 tablets by mouth every 4 (four) hours as needed for severe pain.   pravastatin 40 MG tablet Commonly known as:  PRAVACHOL Take 1 tablet (40 mg total) by mouth daily.   vitamin B-12 100 MCG tablet Commonly known as:  CYANOCOBALAMIN Take 100 mcg by mouth daily.       Meds ordered this encounter  Medications  . DISCONTD: glucose blood test  strip    Sig: 1 each by Other route as needed for other. Use as instructed  . DISCONTD: ACCU-CHEK SOFTCLIX LANCETS lancets    Sig: 1 each by Other route 2 (two) times daily. Use as instructed    Immunization History  Administered Date(s) Administered  . Influenza,inj,Quad PF,36+ Mos 04/14/2014  . Pneumococcal Polysaccharide-23 01/13/2014    Social History  Substance Use Topics  . Smoking status: Never Smoker  . Smokeless tobacco: Current User    Types: Snuff  . Alcohol use No     Comment: occ     Family history is   Family History  Problem Relation Age of Onset  . Family history unknown: Yes       Review of Systems  DATA OBTAINED: from patient, nurse, medical record, family member GENERAL:  no fevers, fatigue, appetite changes SKIN: No itching, rash or wounds EYES: No eye pain, redness, discharge EARS: No earache, tinnitus, change in hearing NOSE: No congestion, drainage or bleeding  MOUTH/THROAT: No mouth or tooth pain, No sore throat RESPIRATORY: No cough, wheezing, SOB CARDIAC: No chest pain, palpitations, lower extremity edema  GI: No abdominal pain, No N/V/D or constipation, No heartburn or reflux  GU: No dysuria, frequency or urgency, or incontinence  MUSCULOSKELETAL: No unrelieved bone/joint pain NEUROLOGIC: No headache, dizziness or focal weakness PSYCHIATRIC: No c/o anxiety or sadness   Vitals:   03/02/16 0927  BP: (!) 158/78  Pulse: 79  Resp: 20  Temp: 98 F (36.7 C)    SpO2 Readings from Last 1 Encounters:  03/24/16 97%        Physical Exam  GENERAL APPEARANCE: Alert, conversant,  No acute distress.  SKIN: No diaphoresis rash HEAD: Normocephalic, atraumatic  EYES: Conjunctiva/lids clear. Pupils round, reactive. EOMs intact.  EARS: External exam WNL, canals clear. Hearing grossly normal.  NOSE: No deformity or discharge.  MOUTH/THROAT: Lips w/o lesions  RESPIRATORY: Breathing is even, unlabored. Lung sounds are clear   CARDIOVASCULAR: Heart RRR no murmurs, rubs or gallops. No peripheral edema.   GASTROINTESTINAL: Abdomen is soft, non-tender, not distended w/ normal bowel sounds. GENITOURINARY: Bladder non tender, not distended  MUSCULOSKELETAL: No abnormal joints or musculature NEUROLOGIC:  Cranial nerves 2-12 grossly intact. Moves all extremities  PSYCHIATRIC: Mood and affect appropriate to situation, no behavioral issues  Patient Active Problem List   Diagnosis Date Noted  . S/P BKA (below knee amputation) bilateral (HCC) 02/25/2016  . 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 H/O  osteomyelitis 01/14/2016  . Type 2 diabetes mellitus with complication, without long-term current use of insulin (HCC) 10/14/2015  . Dyslipidemia (high LDL; low HDL) 10/14/2015  . Essential hypertension 06/23/2014  . Diabetic foot (HCC) 05/07/2014       Component Value Date/Time   WBC 8.5 03/03/2016   WBC 10.2 02/25/2016 1158   RBC 3.87 (L) 02/25/2016 1158   HGB 12.8 (A) 03/03/2016   HCT 42 03/03/2016   PLT 274 03/03/2016   MCV 92.0 02/25/2016 1158   LYMPHSABS 2.3 02/25/2015 1721   MONOABS 0.6 02/25/2015 1721   EOSABS 0.3 02/25/2015 1721   BASOSABS 0.1 02/25/2015 1721        Component Value Date/Time   NA 139 03/03/2016   K 4.2 03/03/2016   CL 106 02/25/2016 1158   CO2 23 02/25/2016 1158   GLUCOSE 123 (H) 02/25/2016 1158   BUN 21 03/03/2016   CREATININE 0.8 03/03/2016   CREATININE 0.79 02/25/2016 1158  CREATININE 0.85 02/25/2015 1721   CALCIUM 9.0 02/25/2016 1158   PROT 7.0 08/28/2014 0535   ALBUMIN 2.9 (L) 08/28/2014 0535   AST 22 03/03/2016   ALT 19 03/03/2016   ALKPHOS 74 03/03/2016   BILITOT 0.9 08/28/2014 0535   GFRNONAA >60 02/25/2016 1158   GFRAA >60 02/25/2016 1158    Lab Results  Component Value Date   HGBA1C 6.0 01/14/2016    Lab Results  Component Value Date   CHOL 172 01/14/2016   HDL 45 01/14/2016   LDLCALC 116 01/14/2016   TRIG 54 01/14/2016   CHOLHDL 3.8 01/14/2016     No results found.  Not all labs, radiology exams or other studies done during hospitalization come through on my EPIC note; however they are reviewed by me.    Assessment and Plan  No problem-specific Assessment & Plan notes found for this encounter.   Randon Goldsmith. Lyn Hollingshead, MD   This encounter was created in error - please disregard.

## 2016-03-03 LAB — BASIC METABOLIC PANEL
BUN: 21 mg/dL (ref 4–21)
CREATININE: 0.8 mg/dL (ref 0.6–1.3)
GLUCOSE: 119 mg/dL
POTASSIUM: 4.2 mmol/L (ref 3.4–5.3)
SODIUM: 139 mmol/L (ref 137–147)

## 2016-03-03 LAB — HEPATIC FUNCTION PANEL
ALT: 19 U/L (ref 10–40)
AST: 22 U/L (ref 14–40)
Alkaline Phosphatase: 74 U/L (ref 25–125)
Bilirubin, Total: 0.2 mg/dL

## 2016-03-03 LAB — CBC AND DIFFERENTIAL
HCT: 42 % (ref 41–53)
Hemoglobin: 12.8 g/dL — AB (ref 13.5–17.5)
Platelets: 274 10*3/uL (ref 150–399)
WBC: 8.5 10*3/mL

## 2016-03-24 ENCOUNTER — Encounter: Payer: Self-pay | Admitting: Internal Medicine

## 2016-03-24 ENCOUNTER — Non-Acute Institutional Stay (SKILLED_NURSING_FACILITY): Payer: Medicaid Other | Admitting: Internal Medicine

## 2016-03-24 DIAGNOSIS — Z89512 Acquired absence of left leg below knee: Secondary | ICD-10-CM | POA: Diagnosis not present

## 2016-03-24 DIAGNOSIS — E785 Hyperlipidemia, unspecified: Secondary | ICD-10-CM | POA: Diagnosis not present

## 2016-03-24 DIAGNOSIS — I10 Essential (primary) hypertension: Secondary | ICD-10-CM | POA: Diagnosis not present

## 2016-03-24 DIAGNOSIS — E118 Type 2 diabetes mellitus with unspecified complications: Secondary | ICD-10-CM

## 2016-03-24 DIAGNOSIS — Z89511 Acquired absence of right leg below knee: Secondary | ICD-10-CM

## 2016-03-24 NOTE — Progress Notes (Signed)
Patient ID: Ryan [name], male   DOB: 01/13/1961, 55 y.o.   MRN: [id number]   Location:   [facility] Room Number: 121-B Place of Service:  SNF (31)  Provider: Edmon CrapeArlo Garfield Coiner, PA-C  PCP: Lora PaulaFUNCHES, JOSALYN C, MD Patient Care Team: Dessa PhiJosalyn Funches, MD as PCP - General (Family Medicine)  Extended Emergency Contact Information Primary Emergency Contact: Martin,Roger  [place] Home Phone: [phone] Relation: Friend Secondary Emergency Contact: Charissa Bashisdale,Jorainy  [place] Home Phone: [phone] Relation: Friend  Code Status: Full Code Goals of care:  Advanced Directive information Advanced Directives 03/02/2016  Does patient have an advance directive? No  Would patient like information on creating an advanced directive? -     Allergies  Allergen Reactions  . Metformin And Related Other (See Comments)    Pt states that metformin made sugars fall into 30's ? Needs dosage/schedule adjustment ?    Chief Complaint  Patient presents with  . Discharge Note    Discharge from facility    HPI:  55 y.o. male  in today for discharge from facility.  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  He was here for short-term rehabilitation and has done well.  He will be going home he does live alone but has a very supportive landlord who helps him throughout the day.  He has a wheelchair at home-he will need home health as well as follow up with primary care provider in this has been arranged for 03/30/2016.  He is a type II diabetic not currently on Glucophage nonetheless history sugars are stable.  He is looking forward to going home and has done very well here it appears      Past Medical History:  Diagnosis Date  . Anemia    low iron  . Arthritis   . Diabetes mellitus without complication (HCC)    type 2  . Diabetic neuropathy (HCC)   . Diabetic neuropathy  (HCC)   . Headache    migraines as a child  . Heart murmur    was told "not to worry about it"  . Hypertension   . Osteomyelitis of foot (HCC)   . Peripheral vascular disease (HCC)    "poor circulation" in feet and necrosis    Past Surgical History:  Procedure Laterality Date  . AMPUTATION Right 01/13/2014   Procedure: Right great toe amputation;  Surgeon: Jacki Conesonald A Gioffre, MD;  Location: [facility];  Service: Orthopedics;  Laterality: Right;  . AMPUTATION Left 06/08/2014   Procedure: AMPUTATION LEFT GREAT TOE;  Surgeon: Verlee RossettiSteven R Norris, MD;  Location: [facility];  Service: Orthopedics;  Laterality: Left;  . AMPUTATION Left 07/01/2014   Procedure: LEFT First Metatarsal RAY AMPUTATION ;  Surgeon: Verlee RossettiSteven R Norris, MD;  [facility];  Service: Orthopedics;  Laterality: Left;  . AMPUTATION Left 08/28/2014   Procedure: Left Foot Fifth ray resection;  Surgeon: Kathryne Hitchhristopher Y Blackman, MD;  Location: [facility];  Service: Orthopedics;  Laterality: Left;  . AMPUTATION Left 10/08/2014   Procedure: LEFT FOOT TRANSMETATARSAL AMPUTATION;  Surgeon: Kathryne Hitchhristopher Y Blackman, MD;  [facility];  Service: Orthopedics;  Laterality: Left;  . AMPUTATION Left 12/04/2014   Procedure: AMPUTATION BELOW KNEE;  Surgeon: Nadara MustardMarcus Duda V, MD;  Location: [facility] OR;  Service: Orthopedics;  Laterality: Left;  . AMPUTATION Right 02/25/2016   Procedure: RIGHT BELOW KNEE AMPUTATION;  Surgeon: Nadara MustardMarcus V Duda, MD;  Location: [facility] OR;  Service: Orthopedics;  Laterality: Right;  . arm surgery Left    due to broken arm  . KNEE SURGERY Right       reports that he has never smoked. His smokeless tobacco use includes Snuff. He reports that he does not drink alcohol or use drugs. Social History   Social History  . Marital status: Single    Spouse name: N/A  . Number of children: N/A  . Years of education: N/A   Occupational History  . Not on file.   Social History Main Topics  . Smoking status: Never Smoker  . Smokeless tobacco: Current  User    Types: Snuff  . Alcohol use No     Comment: occ   . Drug use: No  . Sexual activity: Not on file   Other Topics Concern  . Not on file   Social History Narrative  . No narrative on file   Functional Status Survey:    Allergies  Allergen Reactions  . Metformin And Related Other (See Comments)    Pt states that metformin made sugars fall into 30's ? Needs dosage/schedule adjustment ?    Pertinent  Health Maintenance Due  Topic Date Due  . OPHTHALMOLOGY EXAM  10/16/1972  . COLONOSCOPY  10/16/2012  . INFLUENZA VACCINE  02/29/2016  . URINE MICROALBUMIN  07/14/2016  . HEMOGLOBIN A1C  07/15/2016  . FOOT EXAM  01/13/2017    Medications:   Medication List       Accurate as of 03/24/16 12:02 PM. Always use your most recent med list.          ibuprofen 400 MG tablet Commonly known as:  ADVIL,MOTRIN Take 400 mg by mouth every 6 (six) hours as needed.   oxyCODONE-acetaminophen 5-325 MG tablet Commonly known as:  PERCOCET/ROXICET Take 1-2 tablets by mouth every 4 (four) hours as needed for severe pain.   pravastatin 40 MG tablet Commonly known as:  PRAVACHOL Take 1 tablet (40 mg total) by mouth daily.   vitamin B-12 100 MCG tablet Commonly known as:  CYANOCOBALAMIN Take 100 mcg by mouth daily.       Review of Systems  Constitutional: Negative for malaise/fatigue.  Respiratory: Negative for cough and shortness of breath.   Cardiovascular: Negative for chest pain, palpitations and leg swelling.  Gastrointestinal: Negative for abdominal pain, constipation and heartburn.  Musculoskeletal: Negative for back pain, joint pain and myalgias.       Is status right bka  --previous left BKA Skin: Negative.   or complaints of rash or itching Neurological: Negative for dizziness or headaches.  Psychiatric/Behavioral: The patient is not nervous/anxious.  upbeat and in good spirits  Vitals:   03/24/16 1149  BP: (!) 158/78  Pulse: 79  Resp: 20  Temp: 98 F (36.7  C)  TempSrc: Oral  SpO2: 97%  Weight: 165 lb (74.8 kg)  Height: 5\' 10"  (1.778 m)   Body mass index is 23.68 kg/m. Physical Exam  Constitutional: He is oriented to person, place, and time. No distress. Pleasant and talkative Eyes: Conjunctivae are normal. No daily intact Neck: Neck supple. No JVD present. No thyromegaly present.  Cardiovascular: Normal rate, regular rhythm and intact distal pulses.   Respiratory: Effort normal and breath sounds normal. No respiratory distress. He has no wheezes.  GI: Soft. Bowel sounds are normal. He exhibits no distension. There is no tenderness.  Musculoskeletal: He exhibits no edema.  Able to move all extremities  Is status post  recent right bka--no signs of infection  Old left bka   Currently ambulating in wheelchair  Lymphadenopathy:    He has no cervical adenopathy.  Neurological: He is alert and oriented to person, place, and time.  Skin: Skin is warm and dry. He is not diaphoretic.  Has wound in place on right bka no obvious signs of infection present   Psychiatric: He has a normal mood and affect.   Labs reviewed: Basic Metabolic Panel:  Recent Labs  62/95/28 1158 03/03/16  NA 137 139  K 4.2 4.2  CL 106  --   CO2 23  --   GLUCOSE 123*  --   BUN 17 21  CREATININE 0.79 0.8  CALCIUM 9.0  --    Liver Function Tests:  Recent Labs  03/03/16  AST 22  ALT 19  ALKPHOS 74   No results for input(s): LIPASE, AMYLASE in the last 8760 hours. No results for input(s): AMMONIA in the last 8760 hours. CBC:  Recent Labs  02/25/16 1158 03/03/16  WBC 10.2 8.5  HGB 11.9* 12.8*  HCT 35.6* 42  MCV 92.0  --   PLT 212 274   Cardiac Enzymes: No results for input(s): CKTOTAL, CKMB, CKMBINDEX, TROPONINI in the last 8760 hours. BNP: Invalid input(s): POCBNP CBG:  Recent Labs  02/27/16 2015 02/28/16 0703 02/28/16 1134  GLUCAP 120* 100* 112*    Procedures and Imaging Studies During Stay: No results found.  Assessment/Plan:       1. Status post right bka: will follow up with Dr. Lajoyce Corners as indicated; will continue therapy as directed per home health evaluation; ; will continue percocet 5/325 mg 1 ro 2 tabs every 6 hours as needed.   2 Diabetes: is diet controlled;  hemoglobin A1c 01/14/2016 was 6.0  3 Hypertension: Systolic is somewhat elevated today but this is not consistent will defer aggressive workup to primary care provider currently on no medication  #4 hyperlipidemia continues on Pravachol follow-up as needed with primary care provider.  Again he will be going home alone with a supportive landlord who helps him throughout the day-he has a wheelchair at home he will receive home [facility]-she has a primary care provider appointment scheduled for 03/30/2016.      Marland Kitchen  Pt was provided with a 30 day supply of prescriptions for medications and refills must be obtained from their PCP.  For controlled substances, a more limited supply may be provided adequate until PCP appointment only.  [id number] note greater than 30 minutes spent assessing patient-discussing patient's status with patient at bedside-reviewing his chart-his labs-and coordinating and formulating a plan of care-of note prescriptions have been written

## 2016-05-11 ENCOUNTER — Ambulatory Visit (INDEPENDENT_AMBULATORY_CARE_PROVIDER_SITE_OTHER): Payer: Medicaid Other | Admitting: Orthopedic Surgery

## 2016-05-11 DIAGNOSIS — Z89511 Acquired absence of right leg below knee: Secondary | ICD-10-CM

## 2016-05-23 ENCOUNTER — Telehealth (INDEPENDENT_AMBULATORY_CARE_PROVIDER_SITE_OTHER): Payer: Self-pay | Admitting: Orthopedic Surgery

## 2016-05-23 DIAGNOSIS — Z89511 Acquired absence of right leg below knee: Secondary | ICD-10-CM

## 2016-05-23 NOTE — Telephone Encounter (Signed)
Patient called regarding his [facility] is stating that the referral to from Dr.Duda

## 2016-05-24 NOTE — Telephone Encounter (Signed)
IC lvm asking patient to please call back for clarification on what he is needing exactly for prosthetic.

## 2016-05-25 ENCOUNTER — Ambulatory Visit: Payer: Medicaid Other | Attending: Internal Medicine | Admitting: Internal Medicine

## 2016-05-25 VITALS — BP 163/85 | HR 58 | Temp 97.5°F | Resp 18 | Ht 68.0 in | Wt 176.4 lb

## 2016-05-25 DIAGNOSIS — I739 Peripheral vascular disease, unspecified: Secondary | ICD-10-CM | POA: Diagnosis not present

## 2016-05-25 DIAGNOSIS — Z89512 Acquired absence of left leg below knee: Secondary | ICD-10-CM | POA: Diagnosis not present

## 2016-05-25 DIAGNOSIS — E119 Type 2 diabetes mellitus without complications: Secondary | ICD-10-CM

## 2016-05-25 DIAGNOSIS — Z0000 Encounter for general adult medical examination without abnormal findings: Secondary | ICD-10-CM | POA: Insufficient documentation

## 2016-05-25 DIAGNOSIS — Z09 Encounter for follow-up examination after completed treatment for conditions other than malignant neoplasm: Secondary | ICD-10-CM | POA: Diagnosis present

## 2016-05-25 DIAGNOSIS — I10 Essential (primary) hypertension: Secondary | ICD-10-CM

## 2016-05-25 DIAGNOSIS — E1169 Type 2 diabetes mellitus with other specified complication: Secondary | ICD-10-CM | POA: Diagnosis not present

## 2016-05-25 DIAGNOSIS — E1140 Type 2 diabetes mellitus with diabetic neuropathy, unspecified: Secondary | ICD-10-CM | POA: Diagnosis not present

## 2016-05-25 DIAGNOSIS — E784 Other hyperlipidemia: Secondary | ICD-10-CM

## 2016-05-25 DIAGNOSIS — Z89511 Acquired absence of right leg below knee: Secondary | ICD-10-CM | POA: Diagnosis not present

## 2016-05-25 DIAGNOSIS — Z23 Encounter for immunization: Secondary | ICD-10-CM | POA: Insufficient documentation

## 2016-05-25 DIAGNOSIS — E785 Hyperlipidemia, unspecified: Secondary | ICD-10-CM | POA: Insufficient documentation

## 2016-05-25 LAB — POCT GLYCOSYLATED HEMOGLOBIN (HGB A1C): HEMOGLOBIN A1C: 6.2

## 2016-05-25 LAB — GLUCOSE, POCT (MANUAL RESULT ENTRY): POC GLUCOSE: 124 mg/dL — AB (ref 70–99)

## 2016-05-25 MED ORDER — PRAVASTATIN SODIUM 20 MG PO TABS: 20.0000 mg | ORAL_TABLET | Freq: Every day | ORAL | 3 refills | Status: AC

## 2016-05-25 MED ORDER — ACCU-CHEK SOFTCLIX LANCET DEV MISC: 12 refills | Status: AC

## 2016-05-25 MED ORDER — GLUCOSE BLOOD VI STRP: ORAL_STRIP | 12 refills | Status: DC

## 2016-05-25 MED ORDER — ACCU-CHEK AVIVA PLUS W/DEVICE KIT: 1.0000 | PACK | Freq: Three times a day (TID) | 0 refills | Status: AC

## 2016-05-25 NOTE — Progress Notes (Signed)
Patient is here for FU  Patient denies pain at this time.  Patient would like the flu vaccine today.  Patient has taken not taken medication today and patient has not eaten.

## 2016-05-25 NOTE — Progress Notes (Signed)
Ryan BubaRonald Streetman, is a 55 y.o. male  [id number]  [id number]  DOB - February 15, 1961  Chief Complaint  Patient presents with  . Follow-up      Subjective:   Ryan [name] is a 55 y.o. male with history of type 2 diabetes mellitus, diabetic foot and severe PVD now s/p bilateral transtibial amputation for chronic osteomyelitis, here here today for a follow up visit. Patient has not been taking his Pravachol because of muscle pain, he tolerates it at a reduced dose or when taken alternate days. He was recently discharged from short term rehab facility after his right Transtibial amputation in July 2017. He complained of being given regular diet and not diabetic diet while in the facility. He is not on medication for diabetes now because of hypoglycemias, last HbA1C was 6.0%. He has no complaint today. He has wheelchair at home, uses walker, has bilateral prosthetic legs, no falls, denies depression, denies any suicidal ideation or thoughts. Patient has No headache, No chest pain, No abdominal pain - No Nausea, No Cough - SOB.  Problem  Needs Flu Shot  Healthcare Maintenance  Type 2 Diabetes Mellitus Treated Without Insulin (Hcc)    ALLERGIES: Allergies  Allergen Reactions  . Metformin And Related Other (See Comments)    Pt states that metformin made sugars fall into 30's ? Needs dosage/schedule adjustment ?    PAST MEDICAL HISTORY: Past Medical History:  Diagnosis Date  . Anemia    low iron  . Arthritis   . Diabetes mellitus without complication (HCC)    type 2  . Diabetic neuropathy (HCC)   . Diabetic neuropathy (HCC)   . Headache    migraines as a child  . Heart murmur    was told "not to worry about it"  . Hypertension   . Osteomyelitis of foot (HCC)   . Peripheral vascular disease (HCC)    "poor circulation" in feet and necrosis    MEDICATIONS AT HOME: Prior to Admission medications   Medication Sig Start Date End Date Taking? Authorizing Provider  ibuprofen  (ADVIL,MOTRIN) 400 MG tablet Take 400 mg by mouth every 6 (six) hours as needed.   Yes Historical Provider, MD  oxyCODONE-acetaminophen (PERCOCET/ROXICET) 5-325 MG tablet Take 1-2 tablets by mouth every 4 (four) hours as needed for severe pain.    Yes Historical Provider, MD  pravastatin (PRAVACHOL) 20 MG tablet Take 1 tablet (20 mg total) by mouth daily. 05/25/16  Yes Quentin Angstlugbemiga E Trystyn Dolley, MD  vitamin B-12 (CYANOCOBALAMIN) 100 MCG tablet Take 100 mcg by mouth daily.   Yes Historical Provider, MD    Objective:   Vitals:   05/25/16 1108  BP: (!) 163/85  Pulse: (!) 58  Resp: 18  Temp: 97.5 F (36.4 C)  TempSrc: Oral  SpO2: 100%  Weight: 176 lb 6.4 oz (80 kg)  Height: 5\' 8"  (1.727 m)   Exam General appearance : Awake, alert, not in any distress. Speech Clear. Not toxic looking HEENT: Atraumatic and Normocephalic, pupils equally reactive to light and accomodation Neck: Supple, no JVD. No cervical lymphadenopathy.  Chest: Good air entry bilaterally, no added sounds  CVS: S1 S2 regular, no murmurs.  Abdomen: Bowel sounds present, Non tender and not distended with no gaurding, rigidity or rebound. Extremities: B/L Lower Ext with prosthesis Neurology: Awake alert, and oriented X 3, CN II-XII intact, Non focal Skin: No Rash  Data Review Lab Results  Component Value Date   HGBA1C 6.0 01/14/2016   HGBA1C 6.3 10/14/2015  HGBA1C 6.10 07/15/2015    Assessment & Plan   1. Needs flu shot  - Flu Vaccine QUAD 36+ mos PF IM (Fluarix & Fluzone Quad PF)  2. Dyslipidemia (high LDL; low HDL)  Reduce the dose of Pravachol to 20 mg daily  - pravastatin (PRAVACHOL) 20 MG tablet; Take 1 tablet (20 mg total) by mouth daily.  Dispense: 90 tablet; Refill: 3  3. Type 2 diabetes mellitus treated without insulin (HCC)  - POCT A1C is 6.2% today, patient will continue diet control - Glucose (CBG)  4. Essential hypertension  We have discussed target BP range and blood pressure goal. I have  advised patient to check BP regularly and to call us back or report to clinic if the numbers are consistently higher than 140/90. We discussed the importance of compliance with medical therapy and DASH diet recommended, consequences of uncontrolled hypertension discussed.  - continue current BP medications  Patient have been counseled extensively about nutrition and exercise. Other issues discussed during this visit include: low cholesterol diet, weight control and daily exercise, annual eye examinations at Ophthalmology, importance of adherence with medications and regular follow-up.   Return in about 6 months (around 11/23/2016) for Hemoglobin A1C and Follow up, DM, Follow up HTN.  The patient was given clear instructions to go to ER or return to medical center if symptoms don't improve, worsen or new problems develop. The patient verbalized understanding. The patient was told to call to get lab results if they haven't heard anything in the next week.   This note has been created with Education officer, environmental. Any transcriptional errors are unintentional.    Jeanann Lewandowsky, MD, MHA, Maxwell Caul, CPE [facility] [place], [place] [phone]   05/25/2016, 11:58 AM

## 2016-05-25 NOTE — Patient Instructions (Signed)
Hypertension Hypertension, commonly called high blood pressure, is when the force of blood pumping through your arteries is too strong. Your arteries are the blood vessels that carry blood from your heart throughout your body. A blood pressure reading consists of a higher number over a lower number, such as 110/72. The higher number (systolic) is the pressure inside your arteries when your heart pumps. The lower number (diastolic) is the pressure inside your arteries when your heart relaxes. Ideally you want your blood pressure below 120/80. Hypertension forces your heart to work harder to pump blood. Your arteries may become narrow or stiff. Having untreated or uncontrolled hypertension can cause heart attack, stroke, kidney disease, and other problems. RISK FACTORS Some risk factors for high blood pressure are controllable. Others are not.  Risk factors you cannot control include:   Race. You may be at higher risk if you are African American.  Age. Risk increases with age.  Gender. Men are at higher risk than women before age 45 years. After age 65, women are at higher risk than men. Risk factors you can control include:  Not getting enough exercise or physical activity.  Being overweight.  Getting too much fat, sugar, calories, or salt in your diet.  Drinking too much alcohol. SIGNS AND SYMPTOMS Hypertension does not usually cause signs or symptoms. Extremely high blood pressure (hypertensive crisis) may cause headache, anxiety, shortness of breath, and nosebleed. DIAGNOSIS To check if you have hypertension, your health care provider will measure your blood pressure while you are seated, with your arm held at the level of your heart. It should be measured at least twice using the same arm. Certain conditions can cause a difference in blood pressure between your right and left arms. A blood pressure reading that is higher than normal on one occasion does not mean that you need treatment. If  it is not clear whether you have high blood pressure, you may be asked to return on a different day to have your blood pressure checked again. Or, you may be asked to monitor your blood pressure at home for 1 or more weeks. TREATMENT Treating high blood pressure includes making lifestyle changes and possibly taking medicine. Living a healthy lifestyle can help lower high blood pressure. You may need to change some of your habits. Lifestyle changes may include:  Following the DASH diet. This diet is high in fruits, vegetables, and whole grains. It is low in salt, red meat, and added sugars.  Keep your sodium intake below 2,300 mg per day.  Getting at least 30-45 minutes of aerobic exercise at least 4 times per week.  Losing weight if necessary.  Not smoking.  Limiting alcoholic beverages.  Learning ways to reduce stress. Your health care provider may prescribe medicine if lifestyle changes are not enough to get your blood pressure under control, and if one of the following is true:  You are 18-59 years of age and your systolic blood pressure is above 140.  You are 60 years of age or older, and your systolic blood pressure is above 150.  Your diastolic blood pressure is above 90.  You have diabetes, and your systolic blood pressure is over 140 or your diastolic blood pressure is over 90.  You have kidney disease and your blood pressure is above 140/90.  You have heart disease and your blood pressure is above 140/90. Your personal target blood pressure may vary depending on your medical conditions, your age, and other factors. HOME CARE INSTRUCTIONS    Have your blood pressure rechecked as directed by your health care provider.   Take medicines only as directed by your health care provider. Follow the directions carefully. Blood pressure medicines must be taken as prescribed. The medicine does not work as well when you skip doses. Skipping doses also puts you at risk for  problems.  Do not smoke.   Monitor your blood pressure at home as directed by your health care provider. SEEK MEDICAL CARE IF:   You think you are having a reaction to medicines taken.  You have recurrent headaches or feel dizzy.  You have swelling in your ankles.  You have trouble with your vision. SEEK IMMEDIATE MEDICAL CARE IF:  You develop a severe headache or confusion.  You have unusual weakness, numbness, or feel faint.  You have severe chest or abdominal pain.  You vomit repeatedly.  You have trouble breathing. MAKE SURE YOU:   Understand these instructions.  Will watch your condition.  Will get help right away if you are not doing well or get worse.   This information is not intended to replace advice given to you by your health care provider. Make sure you discuss any questions you have with your health care provider.   Document Released: 07/17/2005 Document Revised: 12/01/2014 Document Reviewed: 05/09/2013 Elsevier Interactive Patient Education 2016 [employer]. Diabetes Mellitus and Food It is important for you to manage your blood sugar (glucose) level. Your blood glucose level can be greatly affected by what you eat. Eating healthier foods in the appropriate amounts throughout the day at about the same time each day will help you control your blood glucose level. It can also help slow or prevent worsening of your diabetes mellitus. Healthy eating may even help you improve the level of your blood pressure and reach or maintain a healthy weight.  General recommendations for healthful eating and cooking habits include:  Eating meals and snacks regularly. Avoid going long periods of time without eating to lose weight.  Eating a diet that consists mainly of plant-based foods, such as fruits, vegetables, nuts, legumes, and whole grains.  Using low-heat cooking methods, such as baking, instead of high-heat cooking methods, such as deep frying. Work with your  dietitian to make sure you understand how to use the Nutrition Facts information on food labels. HOW CAN FOOD AFFECT ME? Carbohydrates Carbohydrates affect your blood glucose level more than any other type of food. Your dietitian will help you determine how many carbohydrates to eat at each meal and teach you how to count carbohydrates. Counting carbohydrates is important to keep your blood glucose at a healthy level, especially if you are using insulin or taking certain medicines for diabetes mellitus. Alcohol Alcohol can cause sudden decreases in blood glucose (hypoglycemia), especially if you use insulin or take certain medicines for diabetes mellitus. Hypoglycemia can be a life-threatening condition. Symptoms of hypoglycemia (sleepiness, dizziness, and disorientation) are similar to symptoms of having too much alcohol.  If your health care provider has given you approval to drink alcohol, do so in moderation and use the following guidelines:  Women should not have more than one drink per day, and men should not have more than two drinks per day. One drink is equal to:  12 oz of beer.  5 oz of wine.  1 oz of hard liquor.  Do not drink on an empty stomach.  Keep yourself hydrated. Have water, diet soda, or unsweetened iced tea.  Regular soda, juice, and other mixers  might contain a lot of carbohydrates and should be counted. WHAT FOODS ARE NOT RECOMMENDED? As you make food choices, it is important to remember that all foods are not the same. Some foods have fewer nutrients per serving than other foods, even though they might have the same number of calories or carbohydrates. It is difficult to get your body what it needs when you eat foods with fewer nutrients. Examples of foods that you should avoid that are high in calories and carbohydrates but low in nutrients include:  Trans fats (most processed foods list trans fats on the Nutrition Facts label).  Regular  soda.  Juice.  Candy.  Sweets, such as cake, pie, doughnuts, and cookies.  Fried foods. WHAT FOODS CAN I EAT? Eat nutrient-rich foods, which will nourish your body and keep you healthy. The food you should eat also will depend on several factors, including:  The calories you need.  The medicines you take.  Your weight.  Your blood glucose level.  Your blood pressure level.  Your cholesterol level. You should eat a variety of foods, including:  Protein.  Lean cuts of meat.  Proteins low in saturated fats, such as fish, egg whites, and beans. Avoid processed meats.  Fruits and vegetables.  Fruits and vegetables that may help control blood glucose levels, such as apples, mangoes, and yams.  Dairy products.  Choose fat-free or low-fat dairy products, such as milk, yogurt, and cheese.  Grains, bread, pasta, and rice.  Choose whole grain products, such as multigrain bread, whole oats, and brown rice. These foods may help control blood pressure.  Fats.  Foods containing healthful fats, such as nuts, avocado, olive oil, canola oil, and fish. DOES EVERYONE WITH DIABETES MELLITUS HAVE THE SAME MEAL PLAN? Because every person with diabetes mellitus is different, there is not one meal plan that works for everyone. It is very important that you meet with a dietitian who will help you create a meal plan that is just right for you.   This information is not intended to replace advice given to you by your health care provider. Make sure you discuss any questions you have with your health care provider.   Document Released: 04/13/2005 Document Revised: 08/07/2014 Document Reviewed: 06/13/2013 Elsevier Interactive Patient Education 2016 [employer] Inc[employer] Diabetes and Foot Care Diabetes may cause you to have problems because of poor blood supply (circulation) to your feet and legs. This may cause the skin on your feet to become thinner, break easier, and heal more slowly. Your skin  may become dry, and the skin may peel and crack. You may also have nerve damage in your legs and feet causing decreased feeling in them. You may not notice minor injuries to your feet that could lead to infections or more serious problems. Taking care of your feet is one of the most important things you can do for yourself.  HOME CARE INSTRUCTIONS  Wear shoes at all times, even in the house. Do not go barefoot. Bare feet are easily injured.  Check your feet daily for blisters, cuts, and redness. If you cannot see the bottom of your feet, use a mirror or ask someone for help.  Wash your feet with warm water (do not use hot water) and mild soap. Then pat your feet and the areas between your toes until they are completely dry. Do not soak your feet as this can dry your skin.  Apply a moisturizing lotion or petroleum jelly (that does not contain alcohol  and is unscented) to the skin on your feet and to dry, brittle toenails. Do not apply lotion between your toes.  Trim your toenails straight across. Do not dig under them or around the cuticle. File the edges of your nails with an emery board or nail file.  Do not cut corns or calluses or try to remove them with medicine.  Wear clean socks or stockings every day. Make sure they are not too tight. Do not wear knee-high stockings since they may decrease blood flow to your legs.  Wear shoes that fit properly and have enough cushioning. To break in new shoes, wear them for just a few hours a day. This prevents you from injuring your feet. Always look in your shoes before you put them on to be sure there are no objects inside.  Do not cross your legs. This may decrease the blood flow to your feet.  If you find a minor scrape, cut, or break in the skin on your feet, keep it and the skin around it clean and dry. These areas may be cleansed with mild soap and water. Do not cleanse the area with peroxide, alcohol, or iodine.  When you remove an adhesive  bandage, be sure not to damage the skin around it.  If you have a wound, look at it several times a day to make sure it is healing.  Do not use heating pads or hot water bottles. They may burn your skin. If you have lost feeling in your feet or legs, you may not know it is happening until it is too late.  Make sure your health care provider performs a complete foot exam at least annually or more often if you have foot problems. Report any cuts, sores, or bruises to your health care provider immediately. SEEK MEDICAL CARE IF:   You have an injury that is not healing.  You have cuts or breaks in the skin.  You have an ingrown nail.  You notice redness on your legs or feet.  You feel burning or tingling in your legs or feet.  You have pain or cramps in your legs and feet.  Your legs or feet are numb.  Your feet always feel cold. SEEK IMMEDIATE MEDICAL CARE IF:   There is increasing redness, swelling, or pain in or around a wound.  There is a red line that goes up your leg.  Pus is coming from a wound.  You develop a fever or as directed by your health care provider.  You notice a bad smell coming from an ulcer or wound.   This information is not intended to replace advice given to you by your health care provider. Make sure you discuss any questions you have with your health care provider.   Document Released: 07/14/2000 Document Revised: 03/19/2013 Document Reviewed: 12/24/2012 Elsevier Interactive Patient Education 2016 Elsevier Inc. Basic Carbohydrate Counting for Diabetes Mellitus Carbohydrate counting is a method for keeping track of the amount of carbohydrates you eat. Eating carbohydrates naturally increases the level of sugar (glucose) in your blood, so it is important for you to know the amount that is okay for you to have in every meal. Carbohydrate counting helps keep the level of glucose in your blood within normal limits. The amount of carbohydrates allowed is  different for every person. A dietitian can help you calculate the amount that is right for you. Once you know the amount of carbohydrates you can have, you can count the carbohydrates  in the foods you want to eat. Carbohydrates are found in the following foods:  Grains, such as breads and cereals.  Dried beans and soy products.  Starchy vegetables, such as potatoes, peas, and corn.  Fruit and fruit juices.  Milk and yogurt.  Sweets and snack foods, such as cake, cookies, candy, chips, soft drinks, and fruit drinks. CARBOHYDRATE COUNTING There are two ways to count the carbohydrates in your food. You can use either of the methods or a combination of both. Reading the "Nutrition Facts" on Packaged Food The "Nutrition Facts" is an area that is included on the labels of almost all packaged food and beverages in the [place]. It includes the serving size of that food or beverage and information about the nutrients in each serving of the food, including the grams (g) of carbohydrate per serving.  Decide the number of servings of this food or beverage that you will be able to eat or drink. Multiply that number of servings by the number of grams of carbohydrate that is listed on the label for that serving. The total will be the amount of carbohydrates you will be having when you eat or drink this food or beverage. Learning Standard Serving Sizes of Food When you eat food that is not packaged or does not include "Nutrition Facts" on the label, you need to measure the servings in order to count the amount of carbohydrates.A serving of most carbohydrate-rich foods contains about 15 g of carbohydrates. The following list includes serving sizes of carbohydrate-rich foods that provide 15 g ofcarbohydrate per serving:   1 slice of bread (1 oz) or 1 six-inch tortilla.    of a hamburger bun or English muffin.  4-6 crackers.   cup unsweetened dry cereal.    cup hot cereal.   cup rice or  pasta.    cup mashed potatoes or  of a large baked potato.  1 cup fresh fruit or one small piece of fruit.    cup canned or frozen fruit or fruit juice.  1 cup milk.   cup plain fat-free yogurt or yogurt sweetened with artificial sweeteners.   cup cooked dried beans or starchy vegetable, such as peas, corn, or potatoes.  Decide the number of standard-size servings that you will eat. Multiply that number of servings by 15 (the grams of carbohydrates in that serving). For example, if you eat 2 cups of strawberries, you will have eaten 2 servings and 30 g of carbohydrates (2 servings x 15 g = 30 g). For foods such as soups and casseroles, in which more than one food is mixed in, you will need to count the carbohydrates in each food that is included. EXAMPLE OF CARBOHYDRATE COUNTING Sample Dinner  3 oz chicken breast.   cup of brown rice.   cup of corn.  1 cup milk.   1 cup strawberries with sugar-free whipped topping.  Carbohydrate Calculation Step 1: Identify the foods that contain carbohydrates:   Rice.   Corn.   Milk.   Strawberries. Step 2:Calculate the number of servings eaten of each:   2 servings of rice.   1 serving of corn.   1 serving of milk.   1 serving of strawberries. Step 3: Multiply each of those number of servings by 15 g:   2 servings of rice x 15 g = 30 g.   1 serving of corn x 15 g = 15 g.   1 serving of milk x 15 g =  15 g.   1 serving of strawberries x 15 g = 15 g. Step 4: Add together all of the amounts to find the total grams of carbohydrates eaten: 30 g + 15 g + 15 g + 15 g = 75 g.   This information is not intended to replace advice given to you by your health care provider. Make sure you discuss any questions you have with your health care provider.   Document Released: 07/17/2005 Document Revised: 08/07/2014 Document Reviewed: 06/13/2013 Elsevier Interactive Patient Education [employer]

## 2016-05-26 NOTE — Telephone Encounter (Signed)
[facility] called [facility] advised [facility]. Order entered in encounter and sent.

## 2016-06-02 ENCOUNTER — Telehealth (INDEPENDENT_AMBULATORY_CARE_PROVIDER_SITE_OTHER): Payer: Self-pay | Admitting: Orthopedic Surgery

## 2016-06-05 ENCOUNTER — Encounter (INDEPENDENT_AMBULATORY_CARE_PROVIDER_SITE_OTHER): Payer: Self-pay | Admitting: Orthopedic Surgery

## 2016-06-05 NOTE — Progress Notes (Signed)
Disability form for pt is complete and has been faxed per request with office visit notes that pt signed release of information for to [employer] fax # [phone]

## 2016-06-06 ENCOUNTER — Telehealth (INDEPENDENT_AMBULATORY_CARE_PROVIDER_SITE_OTHER): Payer: Self-pay | Admitting: Orthopedic Surgery

## 2016-06-06 NOTE — Telephone Encounter (Signed)
There is no paperwork that has been sent here for completion. I asked the pt if it was just an rx that needed to be sent and he stated no. He then said that Denny Peonrin knows what I need and was not able to give any other information. There is nothing documented in his chart

## 2016-06-06 NOTE — Telephone Encounter (Signed)
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 Patient [facility]    The fax# [phone]  The number to contact patient is [phone]

## 2016-06-06 NOTE — Telephone Encounter (Signed)
I called the pt and he was very argumentative and I was not able to clearly understand what it was that he was needing. The pt kept asking for " the paperwork" after many times asking if this was for work, for possible employment and advising that we did not have anything here to be completed for him after much discussion it was advised that he is trying to get a drivers evaluation for hand controls for his car. I advised the pt that if there is something that we need to complete to have the company send this to us and we are happy to fill out whatever he wants. The pt states that he does not understand why they have to do that bc we" sent him for rehab and for a prosthetic and it just happened we did not have to send forms" I advised the pt that he was the one that called us asking if we had faxed over forms and that I had no idea what he was talking about. I asked if he needed an rx for the evaluation and he said that we were supposed to have forms to send the office. I advised that I could fax over the last dictation but that we don't have driving evaluation forms. I advised to have the company fax over whatever they need and we can complete this for him. He was very agitated and would not be specific about where he wanted to go and who this paperwork was supposed to be going to. Pt hung up the phone before the call was complete. I am signing off on note as I have no paperwork, no point of contact and no information to send nor whom to send it to.

## 2016-06-07 ENCOUNTER — Telehealth (INDEPENDENT_AMBULATORY_CARE_PROVIDER_SITE_OTHER): Payer: Self-pay | Admitting: Orthopedic Surgery

## 2016-06-07 NOTE — Telephone Encounter (Signed)
Everardo PacificKenisha with [facility] called needing order from Dr Lajoyce Cornersuda for patient to be referred for the driving program.   The number to contact her is [phone]    The fax# is [phone]

## 2016-06-08 ENCOUNTER — Ambulatory Visit: Payer: Medicaid Other | Attending: Orthopedic Surgery | Admitting: Physical Therapy

## 2016-06-08 ENCOUNTER — Encounter: Payer: Self-pay | Admitting: Physical Therapy

## 2016-06-08 DIAGNOSIS — R2689 Other abnormalities of gait and mobility: Secondary | ICD-10-CM | POA: Insufficient documentation

## 2016-06-08 DIAGNOSIS — R2681 Unsteadiness on feet: Secondary | ICD-10-CM | POA: Diagnosis present

## 2016-06-08 NOTE — Therapy (Signed)
[facility] [address] [place], [place], [address] Phone: [phone]   Fax:  [phone]  Physical Therapy Evaluation  Patient Details  Name: Ryan [name] MRN: [id number] Date of Birth: 09-17-1960 Referring Provider: Aldean Baker, MD  Encounter Date: 06/08/2016      PT End of Session - 06/08/16 1051    Visit Number 1   Number of Visits 9   Authorization Type Medicaid   PT Start Time 0800   PT Stop Time 0849   PT Time Calculation (min) 49 min      Past Medical History:  Diagnosis Date  . Anemia    low iron  . Arthritis   . Diabetes mellitus without complication (HCC)    type 2  . Diabetic neuropathy (HCC)   . Diabetic neuropathy (HCC)   . Headache    migraines as a child  . Heart murmur    was told "not to worry about it"  . Hypertension   . Osteomyelitis of foot (HCC)   . Peripheral vascular disease (HCC)    "poor circulation" in feet and necrosis    Past Surgical History:  Procedure Laterality Date  . AMPUTATION Right 01/13/2014   Procedure: Right great toe amputation;  Surgeon: Jacki Cones, MD;  Location: [facility];  Service: Orthopedics;  Laterality: Right;  . AMPUTATION Left 06/08/2014   Procedure: AMPUTATION LEFT GREAT TOE;  Surgeon: Verlee Rossetti, MD;  Location: [facility];  Service: Orthopedics;  Laterality: Left;  . AMPUTATION Left 07/01/2014   Procedure: LEFT First Metatarsal RAY AMPUTATION ;  Surgeon: Verlee Rossetti, MD;  Location: [facility] OR;  Service: Orthopedics;  Laterality: Left;  . AMPUTATION Left 08/28/2014   Procedure: Left Foot Fifth ray resection;  Surgeon: Kathryne Hitch, MD;  Location: [facility];  Service: Orthopedics;  Laterality: Left;  . AMPUTATION Left 10/08/2014   Procedure: LEFT FOOT TRANSMETATARSAL AMPUTATION;  Surgeon: Kathryne Hitch, MD;  Location: [facility] OR;  Service: Orthopedics;  Laterality: Left;  . AMPUTATION Left 12/04/2014   Procedure: AMPUTATION BELOW KNEE;   Surgeon: Nadara Mustard, MD;  Location: [facility] OR;  Service: Orthopedics;  Laterality: Left;  . AMPUTATION Right 02/25/2016   Procedure: RIGHT BELOW KNEE AMPUTATION;  Surgeon: Nadara Mustard, MD;  Location: [facility] OR;  Service: Orthopedics;  Laterality: Right;  . arm surgery Left    due to broken arm  . KNEE SURGERY Right     There were no vitals filed for this visit.       Subjective Assessment - 06/08/16 0804    Subjective This 55yo male underwent a right Transtibial Amputation on 02/25/2016 and previously had a left Transtibial Amputation on 12/04/2014. He recieved his first right prosthesis on 05/17/2016. He had therapy after left TTA and was ambulatory at community level with variable cadence without device. He did have a left socket revision 12/20/2015 with change in supension design. He is dependent in mobility with bilateral prostheses and progressing safe use of right amputation.  He presents to PT for evaluation.    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    Currently in Pain? No/denies            [facility] PT Assessment - 06/08/16 0800      Assessment   Medical Diagnosis Bilateral Transtibial Amputations   Referring Provider Aldean Baker, MD  Onset Date/Surgical Date 05/17/16  right prosthesis delivery   Hand Dominance Right     Precautions   Precautions Fall     Balance Screen   Has the patient fallen in the past 6 months Yes   How many times? 1  transfering with single prosthesis   Has the patient had a decrease in activity level because of a fear of falling?  No   Is the patient reluctant to leave their home because of a fear of falling?  No     Home Environment   Living Environment Private residence   Living Arrangements Alone   Type of Home House   Home Access Stairs to enter   Entrance Stairs-Number of Steps 5   Entrance Stairs-Rails Right;Left;Cannot  reach both   Home Layout One level   Home Economist - 2 wheels;Crutches     Prior Function   Level of Independence Independent;Independent with gait;Independent with community mobility without device;Independent with household mobility without device   Leisure cycling     Observation/Other Assessments   Focus on Therapeutic Outcomes (FOTO)  27.77 Functional Status   Activities of Balance Confidence Scale (ABC Scale)  78.8%   Fear Avoidance Belief Questionnaire (FABQ)  26 (8)     Posture/Postural Control   Posture/Postural Control Postural limitations   Postural Limitations Rounded Shoulders;Forward head;Flexed trunk     ROM / Strength   AROM / PROM / Strength AROM;Strength     AROM   Overall AROM  Within functional limits for tasks performed     Strength   Overall Strength Within functional limits for tasks performed     Transfers   Transfers Sit to Stand;Stand to Sit   Sit to Stand 5: Supervision;With upper extremity assist;With armrests;From chair/3-in-1   Stand to Sit 5: Supervision;With upper extremity assist;With armrests;To chair/3-in-1     Ambulation/Gait   Ambulation/Gait Yes   Ambulation/Gait Assistance 4: Min guard;5: Supervision   Ambulation/Gait Assistance Details Pt arrived ambulating with cane & was assessed without device prostheses only   Ambulation Distance (Feet) 250 Feet   Assistive device Prostheses   Gait Pattern Step-through pattern;Decreased stride length;Lateral hip instability;Trunk flexed;Wide base of support   Ambulation Surface Indoor;Level   Gait velocity 3.25 ft/sec comfortable & 4.74 ft/sec fast   Stairs Yes   Stairs Assistance 5: Supervision   Stair Management Technique Two rails;Step to pattern;Forwards   Number of Stairs 4   Ramp 4: Min assist  bil. prostheses only   Curb 4: Min assist  bil. prostheses only     Standardized Balance Assessment   Standardized Balance Assessment Berg Balance Test     Berg Balance  Test   Sit to Stand Able to stand  independently using hands   Standing Unsupported Able to stand safely 2 minutes   Sitting with Back Unsupported but Feet Supported on Floor or Stool Able to sit safely and securely 2 minutes   Stand to Sit Controls descent by using hands   Transfers Able to transfer safely, minor use of hands   Standing Unsupported with Eyes Closed Able to stand 10 seconds with supervision   Standing Ubsupported with Feet Together Able to place feet together independently and stand for 1 minute with supervision   From Standing, Reach Forward with Outstretched Arm Can reach forward >5 cm safely (2")   From Standing Position, Pick up Object from Floor Able to pick up shoe, needs supervision   From Standing Position, Turn to Look  Behind Over each Shoulder Turn sideways only but maintains balance   Turn 360 Degrees Needs close supervision or verbal cueing   Standing Unsupported, Alternately Place Feet on Step/Stool Able to complete >2 steps/needs minimal assist   Standing Unsupported, One Foot in Front Able to take small step independently and hold 30 seconds   Standing on One Leg Tries to lift leg/unable to hold 3 seconds but remains standing independently   Total Score 36   Berg comment: <45/56 indicates high fall risk     Functional Gait  Assessment   Gait assessed  Yes   Gait Level Surface Walks 20 ft in less than 7 sec but greater than 5.5 sec, uses assistive device, slower speed, mild gait deviations, or deviates 6-10 in outside of the 12 in walkway width.   Change in Gait Speed Able to change speed, demonstrates mild gait deviations, deviates 6-10 in outside of the 12 in walkway width, or no gait deviations, unable to achieve a major change in velocity, or uses a change in velocity, or uses an assistive device.   Gait with Horizontal Head Turns Performs head turns smoothly with slight change in gait velocity (eg, minor disruption to smooth gait path), deviates 6-10 in  outside 12 in walkway width, or uses an assistive device.   Gait with Vertical Head Turns Performs task with slight change in gait velocity (eg, minor disruption to smooth gait path), deviates 6 - 10 in outside 12 in walkway width or uses assistive device   Gait and Pivot Turn Pivot turns safely in greater than 3 sec and stops with no loss of balance, or pivot turns safely within 3 sec and stops with mild imbalance, requires small steps to catch balance.   Step Over Obstacle Is able to step over one shoe box (4.5 in total height) but must slow down and adjust steps to clear box safely. May require verbal cueing.   Gait with Narrow Base of Support Ambulates less than 4 steps heel to toe or cannot perform without assistance.   Gait with Eyes Closed Walks 20 ft, slow speed, abnormal gait pattern, evidence for imbalance, deviates 10-15 in outside 12 in walkway width. Requires more than 9 sec to ambulate 20 ft.   Ambulating Backwards Walks 20 ft, slow speed, abnormal gait pattern, evidence for imbalance, deviates 10-15 in outside 12 in walkway width.   Steps Two feet to a stair, must use rail.   Total Score 14         Prosthetics Assessment - 06/08/16 0800      Prosthetics   Prosthetic Care Independent with Skin check;Ply sock cleaning   Prosthetic Care Dependent with Residual limb care;Prosthetic cleaning;Correct ply sock adjustment;Proper wear schedule/adjustment;Proper weight-bearing schedule/adjustment   Donning prosthesis  Supervision  verbal cues on proper technique bilaterally   Current prosthetic wear tolerance (days/week)  daily   Current prosthetic wear tolerance (#hours/day)  reports increased wear of RLE up to most of awake hours 2 days ago.    Current prosthetic weight-bearing tolerance (hours/day)  Pt tolerated standing / gait activities for 15 min with no complaint of pain or discomfort.    Edema none   Residual limb condition  Right: cylinderical, 4 small (<862mm) opening along  suture, PT removed suture with tweezers from lateral incision area, redness with signs of heat rash, scar is mobilie, good hair growth & temperature   K code/activity level with prosthetic use  K3 full community with variable cadence  [facility] Adult PT Treatment/Exercise - 06/08/16 0800      Prosthetics   Education Provided Skin check;Residual limb care;Prosthetic cleaning;Correct ply sock adjustment;Proper Donning   Person(s) Educated Patient   Education Method Explanation;Demonstration;Tactile cues;Verbal cues   Education Method Verbalized understanding;Returned demonstration;Tactile cues required;Verbal cues required;Needs further instruction                PT Education - 06/08/16 1052    Education provided Yes   Education Details driving options & testing with bil. BKA prostheses.    Person(s) Educated Patient   Methods Explanation;Demonstration;Verbal cues   Comprehension Verbalized understanding          PT Short Term Goals - 06/08/16 1246      PT SHORT TERM GOAL #1   Title Patient tolerates prosthesis wear >90% of awake hours with no new wounds or increase heat rash on limb. (Target Date: 4th treatment after evaluation)   Baseline Patient increased wear of right prosthesis to most of awake hours 2 days prior to evaluation but has 4 small wounds & heat rash placing risk of skin breakdown.    Time 4   Period Weeks   Status New     PT SHORT TERM GOAL #2   Title Patient demo proper donning of bilateral prostheses and proper cleaning. (Target Date: 4th treatment after evaluation)   Baseline Patient requires skilled instruction in proper donning & cleaning.    Time 4   Period Weeks   Status New     PT SHORT TERM GOAL #3   Title Berg Balance >42/56 to indicate lower fall risk. (Target Date: 4th treatment after evaluation)   Baseline Berg Balance 36/56   Time 4   Period Weeks   Status New     PT SHORT TERM GOAL #4   Title Patient  ambulates 500' including grass, ramps, curbs with prostheses only with supervision. (Target Date: 4th treatment after evaluation)   Baseline Patient ambulates 250' with prostheses only with supervision to min guard on indoor surfaces & minA on ramps/ curbs.   Time 4   Period Weeks   Status New           PT Long Term Goals - 06/08/16 1057      PT LONG TERM GOAL #1   Title Patient verbalizes proper prosthetic care including donning, adjusting ply socks, residual limb management & skin integrity to enable safe use of prosthesis. (Target Date: 8th treatment)    Baseline Patient needs skilled instruction for proper use of right prosthesis to minimize skin issues and left prosthesis with new suspension design.    Time 8   Period Weeks   Status New     PT LONG TERM GOAL #2   Title Patient tolerates prosthesis wear >90% of awake hours with no skin issues or tenderness. (Target Date: 8th treatment)    Baseline Patient progressed wear of right prosthesis to most of awake hours 2 days before PT eval but skin issues with 4 small wounds & signs of heat rash.    Time 8   Period Weeks   Status New     PT LONG TERM GOAL #3   Title Patient ambulates >1000' including grass, ramps & curbs with prostheses only independently. (Target Date: 8th treatment)    Baseline patient ambulated 250' with prostheses only with close supervision due to gait deviations increasing fall risk especially negotiating around obstacles or scanning environment.    Time 8   Period Weeks  Status New     PT LONG TERM GOAL #4   Title Berg Balance test >/= 52/56 to indicate lower fall risk   (Target Date: 8th treatment)    Baseline Berg Balance 36/56   Time 8   Period Weeks   Status New     PT LONG TERM GOAL #5   Title Functional Gait Assessment >/=19/30 to indicate lower fall risk with gait. (Target Date: 8th treatment)    Baseline Functional Gait Assessment 14/30   Time 8   Period Weeks   Status New     Additional  Long Term Goals   Additional Long Term Goals Yes     PT LONG TERM GOAL #6   Title Patient performs ADLs/work related tasks including pushing / pulling like mopping, vacuum, lifitng & carrying up to 30#, climbing ladders with prosthesis independently. (Target Date: 8th treatment)    Baseline Patient is dependent in ADLs & work related tasks with bilateral prostheses.   Time 8   Status New               Plan - 06/08/16 1052    Clinical Impression Statement This 55yo male underwent amputation of second lower extremity and recently recieved prosthesis (05/17/2016) for new amputation. He was successful with single prostheses becoming ambulatory without device but is dependent in gait with high fall risk with bilateral prostheses. He requires skilled instruction to progress safe wear & use for full day without skin issues or limb pain. He has high fall risk noted by Sharlene Motts Balance score of 36/56. Functional Gait Assessment 14/30 also indicates high fall risk. Patient's condition is evolving and plan of care is moderate. patient would benefit from skilled care to progress mobility to community level with variable cadence and safely perform functional tasks like lifting & carrying.    Rehab Potential Good   PT Frequency 1x / week   PT Duration 8 weeks   PT Treatment/Interventions ADLs/Self Care Home Management;DME Instruction;Gait training;Stair training;Functional mobility training;Therapeutic activities;Therapeutic exercise;Balance training;Neuromuscular re-education;Patient/family education;Prosthetic Training   PT Next Visit Plan Review prosthetic care, instruct in HEP for balance.    Consulted and Agree with Plan of Care Patient      Patient will benefit from skilled therapeutic intervention in order to improve the following deficits and impairments:  Abnormal gait, Decreased activity tolerance, Decreased balance, Decreased endurance, Decreased knowledge of use of DME, Decreased mobility,  Postural dysfunction, Prosthetic Dependency  Visit Diagnosis: Unsteadiness on feet  Other abnormalities of gait and mobility     Problem List Patient Active Problem List   Diagnosis Date Noted  . Needs flu shot 05/25/2016  . Healthcare maintenance 05/25/2016  . S/P BKA (below knee amputation) bilateral (HCC) 02/25/2016  . 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 H/O osteomyelitis 01/14/2016  . Type 2 diabetes mellitus treated without insulin (HCC) 10/14/2015  . Dyslipidemia (high LDL; low HDL) 10/14/2015  . Essential hypertension 06/23/2014  . Diabetic foot (HCC) 05/07/2014    Tramond Slinker PT, DPT 06/08/2016, 12:53 PM  [facility] [address] [place], [place], [address] Phone: [phone]   Fax:  [phone]  Name: Ryan [name] MRN: [id number] Date of Birth: 03-04-1961

## 2016-06-08 NOTE — Telephone Encounter (Signed)
This is a duplicate message will sign off on this one

## 2016-06-08 NOTE — Telephone Encounter (Signed)
Patient called stating that Ryan [name] called him and he was returning her call.  Contact Info: [phone]

## 2016-06-09 ENCOUNTER — Telehealth (INDEPENDENT_AMBULATORY_CARE_PROVIDER_SITE_OTHER): Payer: Self-pay | Admitting: Orthopedic Surgery

## 2016-06-09 NOTE — Telephone Encounter (Signed)
Order written and faxed to [phone] as requested,

## 2016-06-09 NOTE — Telephone Encounter (Signed)
Patient called advised the sutures are working themselves out. The sutures that was suppose to dissolve are slowly working themselves out. Patient also asked about the form that [facility] faxed over. Patient asked for a call back at [phone]

## 2016-06-12 NOTE — Telephone Encounter (Signed)
I called patient to advise note was faxed to [facility] on Friday saying he could participate in their driving program. And patient states physical therapist removed internal suture and does not wish to come in. Advised we are here when he needs us.

## 2016-06-13 NOTE — Telephone Encounter (Signed)
Patient states he just spoke with [facility] and they have not received the fax from Friday. Pt request that form be refaxed.

## 2016-06-14 NOTE — Telephone Encounter (Signed)
I called patient asked him to please give me their phone number and I will call them myself to confirm if they are getting our fax. He is at grocery store and will call us back with the phone number.

## 2016-06-15 NOTE — Telephone Encounter (Signed)
PT. Called back to give phone number [phone]

## 2016-06-15 NOTE — Telephone Encounter (Signed)
Tried calling [facility] at number provided below there was no answer, but paperwork refaxed again this evening.

## 2016-06-15 NOTE — Telephone Encounter (Signed)
refaxed to FAX# [phone]

## 2016-06-28 ENCOUNTER — Encounter: Payer: Self-pay | Admitting: Physical Therapy

## 2016-06-28 ENCOUNTER — Ambulatory Visit: Payer: Medicaid Other | Admitting: Physical Therapy

## 2016-06-28 DIAGNOSIS — R2681 Unsteadiness on feet: Secondary | ICD-10-CM | POA: Diagnosis not present

## 2016-06-28 DIAGNOSIS — R2689 Other abnormalities of gait and mobility: Secondary | ICD-10-CM

## 2016-06-28 NOTE — Therapy (Signed)
[facility] [address] [place], [place], [address] Phone: [phone]   Fax:  [phone]  Physical Therapy Treatment  Patient Details  Name: Ryan PellantRonald R [name] MRN: [id number] Date of Birth: 1961/06/15 Referring Provider: Aldean BakerMarcus Duda, MD  Encounter Date: 06/28/2016      PT End of Session - 06/28/16 2203    Visit Number 2   Number of Visits 9   Authorization Type Medicaid   Authorization Time Period 06/26/2016 - 08/20/2016   Authorization - Visit Number 1   Authorization - Number of Visits 8   PT Start Time 1144   PT Stop Time 1230   PT Time Calculation (min) 46 min      Past Medical History:  Diagnosis Date  . Anemia    low iron  . Arthritis   . Diabetes mellitus without complication (HCC)    type 2  . Diabetic neuropathy (HCC)   . Diabetic neuropathy (HCC)   . Headache    migraines as a child  . Heart murmur    was told "not to worry about it"  . Hypertension   . Osteomyelitis of foot (HCC)   . Peripheral vascular disease (HCC)    "poor circulation" in feet and necrosis    Past Surgical History:  Procedure Laterality Date  . AMPUTATION Right 01/13/2014   Procedure: Right great toe amputation;  Surgeon: Jacki Conesonald A Gioffre, MD;  Location: [facility];  Service: Orthopedics;  Laterality: Right;  . AMPUTATION Left 06/08/2014   Procedure: AMPUTATION LEFT GREAT TOE;  Surgeon: Verlee RossettiSteven R Norris, MD;  Location: [facility];  Service: Orthopedics;  Laterality: Left;  . AMPUTATION Left 07/01/2014   Procedure: LEFT First Metatarsal RAY AMPUTATION ;  Surgeon: Verlee RossettiSteven R Norris, MD;  [facility];  Service: Orthopedics;  Laterality: Left;  . AMPUTATION Left 08/28/2014   Procedure: Left Foot Fifth ray resection;  Surgeon: Kathryne Hitchhristopher Y Blackman, MD;  Location: [facility];  Service: Orthopedics;  Laterality: Left;  . AMPUTATION Left 10/08/2014   Procedure: LEFT FOOT TRANSMETATARSAL AMPUTATION;  Surgeon: Kathryne Hitchhristopher Y Blackman, MD;   [facility];  Service: Orthopedics;  Laterality: Left;  . AMPUTATION Left 12/04/2014   Procedure: AMPUTATION BELOW KNEE;  Surgeon: Nadara MustardMarcus Duda V, MD;  Location: [facility];  Service: Orthopedics;  Laterality: Left;  . AMPUTATION Right 02/25/2016   Procedure: RIGHT BELOW KNEE AMPUTATION;  Surgeon: Nadara MustardMarcus V Duda, MD;  Location: [facility];  Service: Orthopedics;  Laterality: Right;  . arm surgery Left    due to broken arm  . KNEE SURGERY Right     There were no vitals filed for this visit.      Subjective Assessment - 06/28/16 1145    Subjective He is wearing both prostheses most of awake hours without issues. He almost fallen several times but no actual falls.      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    Currently in Pain? No/denies     Prosthetic Training with bilateral Transtibial prostheses: PT instructed in signs of sweat, need to dry liner & limb with sweating or q4hrs. Patient has red areas appears to be heat rash. His liner had slippage from sweating upon arrival. PT reviewed proper donning. Gait using line for visual reference for proper step width. Pt ambulated 200' with verbal & visual cues. Stairs: initially with 2 rails, progressed to single  rail. PT demo, instructed in proper weight shift, limb advancement, descend with foot position for knee flexion under control.  Curbs: PT demo, instructed in proper technique with wt shift, foot position and momentum. Pt performed with supervision & verbal cues.  Lifting 15# box with proper technique demo, instruction. Pt return demo understanding with verbal cues. PT demo pushing / pulling with weight shift between LEs. Pt return demo understanding simulated weed-eater. Climbing A-frame ladder: PT demo proper technique. Pt return demo understanding with tactile & verbal cues.                                PT Short Term Goals - 06/28/16 2205      PT SHORT TERM GOAL #1   Title Patient tolerates prosthesis wear >90% of awake hours with no new wounds or increase heat rash on limb. (Target Date: 4th treatment after evaluation)   Baseline Patient increased wear of right prosthesis to most of awake hours 2 days prior to evaluation but has 4 small wounds & heat rash placing risk of skin breakdown.    Time 4   Period Weeks   Status On-going     PT SHORT TERM GOAL #2   Title Patient demo proper donning of bilateral prostheses and proper cleaning. (Target Date: 4th treatment after evaluation)   Baseline Patient requires skilled instruction in proper donning & cleaning.    Time 4   Period Weeks   Status On-going     PT SHORT TERM GOAL #3   Title Berg Balance >42/56 to indicate lower fall risk. (Target Date: 4th treatment after evaluation)   Baseline Berg Balance 36/56   Time 4   Period Weeks   Status On-going     PT SHORT TERM GOAL #4   Title Patient ambulates 500' including grass, ramps, curbs with prostheses only with supervision. (Target Date: 4th treatment after evaluation)   Baseline Patient ambulates 250' with prostheses only with supervision to min guard on indoor surfaces & minA on ramps/ curbs.   Time 4   Period Weeks   Status On-going     PT SHORT TERM GOAL #5   Status On-going           PT Long Term Goals - 06/28/16 2205      PT LONG TERM GOAL #1   Title Patient verbalizes proper prosthetic care including donning, adjusting ply socks, residual limb management & skin integrity to enable safe use of prosthesis. (Target Date: 8th treatment)    Baseline Patient needs skilled instruction for proper use of right prosthesis to minimize skin issues and left prosthesis with new suspension design.    Time 8   Period Weeks   Status On-going     PT LONG TERM GOAL #2   Title Patient tolerates prosthesis wear >90% of awake  hours with no skin issues or tenderness. (Target Date: 8th treatment)    Baseline Patient progressed wear of right prosthesis to most of awake hours 2 days before PT eval but skin issues with 4 small wounds & signs of heat rash.    Time 8   Period Weeks   Status On-going     PT LONG TERM GOAL #3   Title Patient ambulates >1000' including grass, ramps & curbs with prostheses only independently. (Target Date: 8th treatment)    Baseline patient ambulated 250' with prostheses only with close supervision due to gait deviations increasing fall  risk especially negotiating around obstacles or scanning environment.    Time 8   Period Weeks   Status On-going     PT LONG TERM GOAL #4   Title Berg Balance test >/= 52/56 to indicate lower fall risk   (Target Date: 8th treatment)    Baseline Berg Balance 36/56   Time 8   Period Weeks   Status On-going     PT LONG TERM GOAL #5   Title Functional Gait Assessment >/=19/30 to indicate lower fall risk with gait. (Target Date: 8th treatment)    Baseline Functional Gait Assessment 14/30   Time 8   Period Weeks   Status On-going     PT LONG TERM GOAL #6   Title Patient performs ADLs/work related tasks including pushing / pulling like mopping, vacuum, lifitng & carrying up to 30#, climbing ladders with prosthesis independently. (Target Date: 8th treatment)    Baseline Patient is dependent in ADLs & work related tasks with bilateral prostheses.   Time 8   Status On-going               Plan - 06/28/16 2206    Clinical Impression Statement patient improved ability to lift, perform push/pull tasks & climb A-frame ladder with skilled instruction in proper technique with bilateral prostheses.    Rehab Potential Good   PT Frequency 1x / week   PT Duration 8 weeks   PT Treatment/Interventions ADLs/Self Care Home Management;DME Instruction;Gait training;Stair training;Functional mobility training;Therapeutic activities;Therapeutic exercise;Balance  training;Neuromuscular re-education;Patient/family education;Prosthetic Training   PT Next Visit Plan Review prosthetic care, instruct in HEP for balance.    Consulted and Agree with Plan of Care Patient      Patient will benefit from skilled therapeutic intervention in order to improve the following deficits and impairments:  Abnormal gait, Decreased activity tolerance, Decreased balance, Decreased endurance, Decreased knowledge of use of DME, Decreased mobility, Postural dysfunction, Prosthetic Dependency  Visit Diagnosis: Unsteadiness on feet  Other abnormalities of gait and mobility     Problem List Patient Active Problem List   Diagnosis Date Noted  . Needs flu shot 05/25/2016  . Healthcare maintenance 05/25/2016  . S/P BKA (below knee amputation) bilateral (HCC) 02/25/2016  . 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 H/O osteomyelitis 01/14/2016  . Type 2 diabetes mellitus treated without insulin (HCC) 10/14/2015  . Dyslipidemia (high LDL; low HDL) 10/14/2015  . Essential hypertension 06/23/2014  . Diabetic foot (HCC) 05/07/2014    Alexxis Mackert PT, DPT 06/28/2016, 10:08 PM  [facility] [address] [place], [place], [address] Phone: [phone]   Fax:  [phone]  Name: Ryan PellantRonald R Wrightsman MRN: [id number] Date of Birth: September 27, 1960

## 2016-07-06 ENCOUNTER — Ambulatory Visit: Payer: Medicaid Other | Attending: Orthopedic Surgery | Admitting: Physical Therapy

## 2016-07-06 ENCOUNTER — Encounter: Payer: Self-pay | Admitting: Physical Therapy

## 2016-07-06 DIAGNOSIS — R2681 Unsteadiness on feet: Secondary | ICD-10-CM | POA: Diagnosis not present

## 2016-07-06 DIAGNOSIS — R2689 Other abnormalities of gait and mobility: Secondary | ICD-10-CM | POA: Insufficient documentation

## 2016-07-06 NOTE — Therapy (Signed)
[facility] [address] [place], [place], [address] Phone: [phone]   Fax:  [phone]  Physical Therapy Treatment  Patient Details  Name: Ryan [name] MRN: [id number] Date of Birth: [birth year] Referring Provider: Aldean Baker, MD  Encounter Date: 07/06/2016      PT End of Session - 07/06/16 1358    Visit Number 3   Number of Visits 9   Authorization Type Medicaid   Authorization Time Period 06/26/2016 - 08/20/2016   Authorization - Visit Number 2   Authorization - Number of Visits 8   PT Start Time 0933   PT Stop Time 1018   PT Time Calculation (min) 45 min      Past Medical History:  Diagnosis Date  . Anemia    low iron  . Arthritis   . Diabetes mellitus without complication (HCC)    type 2  . Diabetic neuropathy (HCC)   . Diabetic neuropathy (HCC)   . Headache    migraines as a child  . Heart murmur    was told "not to worry about it"  . Hypertension   . Osteomyelitis of foot (HCC)   . Peripheral vascular disease (HCC)    "poor circulation" in feet and necrosis    Past Surgical History:  Procedure Laterality Date  . AMPUTATION Right 01/13/2014   Procedure: Right great toe amputation;  Surgeon: Jacki Cones, MD;  [facility]: [facility];  Service: Orthopedics;  Laterality: Right;  . AMPUTATION Left 06/08/2014   Procedure: AMPUTATION LEFT GREAT TOE;  Surgeon: Verlee Rossetti, MD;  Location: [facility];  Service: Orthopedics;  Laterality: Left;  . AMPUTATION Left 07/01/2014   Procedure: LEFT First Metatarsal RAY AMPUTATION ;  Surgeon: Verlee Rossetti, MD;  [facility]: [facility];  Service: Orthopedics;  Laterality: Left;  . AMPUTATION Left 08/28/2014   Procedure: Left Foot Fifth ray resection;  Surgeon: Kathryne Hitch, MD;  Location: [facility];  Service: Orthopedics;  Laterality: Left;  . AMPUTATION Left 10/08/2014   Procedure: LEFT FOOT TRANSMETATARSAL AMPUTATION;  Surgeon: Kathryne Hitch, MD;   Location: [facility] OR;  Service: Orthopedics;  Laterality: Left;  . AMPUTATION Left 12/04/2014   Procedure: AMPUTATION BELOW KNEE;  Surgeon: Nadara Mustard, MD;  Location: [facility];  Service: Orthopedics;  Laterality: Left;  . AMPUTATION Right 02/25/2016   Procedure: RIGHT BELOW KNEE AMPUTATION;  Surgeon: Nadara Mustard, MD;  Location: [facility];  Service: Orthopedics;  Laterality: Right;  . arm surgery Left    due to broken arm  . KNEE SURGERY Right     There were no vitals filed for this visit.      Subjective Assessment - 07/06/16 0938    Subjective He is wearing prostheses from ~1 hr of arising to ~1-2 hrs before bed.    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    Currently in Pain? No/denies                         [facility] Adult PT Treatment/Exercise - 07/06/16 0930      Transfers   Transfers Sit to Stand;Stand to Sit;Floor to Transfer   Sit to Stand 5: Supervision;From chair/3-in-1;With upper extremity assist   Stand to Sit 5: Supervision;With upper extremity assist;To chair/3-in-1   Floor to Transfer 5: Supervision;With upper extremity assist;From chair/3-in-1  pushing on chair bottom BUEs, progress to chair & wall   Floor to Transfer Details (indicate cue type and reason) PT demo & instructed in technique for wt shift & leading with either BKA / prosthesis. Progressed to 1 hand on wall & 1 hand on chair.   2 reps ea LE with BUE chair & 2 reps ea chair/wall     Ambulation/Gait   Ambulation/Gait Yes   Ambulation/Gait Assistance 5: Supervision   Ambulation/Gait Assistance Details cues on posture & step width   Ambulation Distance (Feet) 500 Feet   Assistive device Prostheses   Gait Pattern --   Ambulation Surface Indoor;Level   Gait velocity --   Stairs Yes   Stairs Assistance 5: Supervision   Stairs Assistance Details (indicate cue type and reason) cues  on proper wt shift & foot position; progressed to technique if no rails available    Stair Management Technique Two rails;Forwards;One rail Right;One rail Left;Alternating pattern;No rails;Step to pattern;Sideways   Number of Stairs 4  10 reps   Ramp 5: Supervision  bil. prostheses only   Ramp Details (indicate cue type and reason) cues on posture & wt shift   Curb 5: Supervision  bil. prostheses only   Curb Details (indicate cue type and reason) cues on step length / position & momentum      Posture/Postural Control   Posture/Postural Control --   Postural Limitations --     Prosthetics   Current prosthetic wear tolerance (days/week)  daily   Current prosthetic wear tolerance (#hours/day)  PT advised to increase to donning with arising & doffing with going to bed.    Current prosthetic weight-bearing tolerance (hours/day)  --   Edema none   Residual limb condition  --   Education Provided Correct ply sock adjustment;Proper Donning;Proper wear schedule/adjustment   Person(s) Educated Patient   Education Method Explanation;Demonstration;Verbal cues   Education Method Verbalized understanding;Returned demonstration;Tactile cues required;Verbal cues required;Needs further instruction                PT Education - 07/06/16 0930    Education provided Yes   Education Details standing theraband (green) hip kicks 4 directions, BLE 10 reps slow & 10 reps fast   Person(s) Educated Patient   Methods Demonstration;Explanation;Tactile cues;Verbal cues   Comprehension Returned demonstration;Verbal cues required;Tactile cues required;Verbalized understanding          PT Short Term Goals - 06/28/16 2205      PT SHORT TERM GOAL #1   Title Patient tolerates prosthesis wear >90% of awake hours with no new wounds or increase heat rash on limb. (Target Date: 4th treatment after evaluation)   Baseline Patient increased wear of right prosthesis to most of awake hours 2 days prior to  evaluation but has 4 small wounds & heat rash placing risk of skin breakdown.    Time 4   Period Weeks   Status On-going     PT SHORT TERM GOAL #2   Title Patient demo proper donning of bilateral prostheses and proper cleaning. (Target Date: 4th treatment after evaluation)   Baseline Patient requires skilled instruction in proper donning & cleaning.    Time 4   Period Weeks   Status On-going     PT SHORT TERM GOAL #3   Title Berg Balance >42/56 to indicate lower fall risk. (Target Date: 4th treatment after evaluation)   Baseline Berg Balance 36/56   Time 4   Period Weeks   Status On-going  PT SHORT TERM GOAL #4   Title Patient ambulates 500' including grass, ramps, curbs with prostheses only with supervision. (Target Date: 4th treatment after evaluation)   Baseline Patient ambulates 250' with prostheses only with supervision to min guard on indoor surfaces & minA on ramps/ curbs.   Time 4   Period Weeks   Status On-going     PT SHORT TERM GOAL #5   Status On-going           PT Long Term Goals - 06/28/16 2205      PT LONG TERM GOAL #1   Title Patient verbalizes proper prosthetic care including donning, adjusting ply socks, residual limb management & skin integrity to enable safe use of prosthesis. (Target Date: 8th treatment)    Baseline Patient needs skilled instruction for proper use of right prosthesis to minimize skin issues and left prosthesis with new suspension design.    Time 8   Period Weeks   Status On-going     PT LONG TERM GOAL #2   Title Patient tolerates prosthesis wear >90% of awake hours with no skin issues or tenderness. (Target Date: 8th treatment)    Baseline Patient progressed wear of right prosthesis to most of awake hours 2 days before PT eval but skin issues with 4 small wounds & signs of heat rash.    Time 8   Period Weeks   Status On-going     PT LONG TERM GOAL #3   Title Patient ambulates >1000' including grass, ramps & curbs with  prostheses only independently. (Target Date: 8th treatment)    Baseline patient ambulated 250' with prostheses only with close supervision due to gait deviations increasing fall risk especially negotiating around obstacles or scanning environment.    Time 8   Period Weeks   Status On-going     PT LONG TERM GOAL #4   Title Berg Balance test >/= 52/56 to indicate lower fall risk   (Target Date: 8th treatment)    Baseline Berg Balance 36/56   Time 8   Period Weeks   Status On-going     PT LONG TERM GOAL #5   Title Functional Gait Assessment >/=19/30 to indicate lower fall risk with gait. (Target Date: 8th treatment)    Baseline Functional Gait Assessment 14/30   Time 8   Period Weeks   Status On-going     PT LONG TERM GOAL #6   Title Patient performs ADLs/work related tasks including pushing / pulling like mopping, vacuum, lifitng & carrying up to 30#, climbing ladders with prosthesis independently. (Target Date: 8th treatment)    Baseline Patient is dependent in ADLs & work related tasks with bilateral prostheses.   Time 8   Status On-going               Plan - 07/06/16 1359    Clinical Impression Statement Patient improved floor transfer ability & stair, ramp, curb negotiation with skilled instruction in proper technique with prostheses. Pt appears to understand HEP for strengthening LEs.    Rehab Potential Good   PT Frequency 1x / week   PT Duration 8 weeks   PT Treatment/Interventions ADLs/Self Care Home Management;DME Instruction;Gait training;Stair training;Functional mobility training;Therapeutic activities;Therapeutic exercise;Balance training;Neuromuscular re-education;Patient/family education;Prosthetic Training   PT Next Visit Plan Review prosthetic care, instruct in HEP for balance.    Consulted and Agree with Plan of Care Patient      Patient will benefit from skilled therapeutic intervention in order to improve the following deficits  and impairments:  Abnormal  gait, Decreased activity tolerance, Decreased balance, Decreased endurance, Decreased knowledge of use of DME, Decreased mobility, Postural dysfunction, Prosthetic Dependency  Visit Diagnosis: Unsteadiness on feet  Other abnormalities of gait and mobility     Problem List Patient Active Problem List   Diagnosis Date Noted  . Needs flu shot 05/25/2016  . Healthcare maintenance 05/25/2016  . S/P BKA (below knee amputation) bilateral (HCC) 02/25/2016  . 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 H/O osteomyelitis 01/14/2016  . Type 2 diabetes mellitus treated without insulin (HCC) 10/14/2015  . Dyslipidemia (high LDL; low HDL) 10/14/2015  . Essential hypertension 06/23/2014  . Diabetic foot (HCC) 05/07/2014    Catricia Scheerer PT, DPT 07/06/2016, 2:00 PM  [facility] [address] [place], [place], [address] Phone: [phone]   Fax:  [phone]  Name: D'ARCY ABRAHA MRN: [id number] Date of Birth: 01-30-1961

## 2016-07-11 ENCOUNTER — Ambulatory Visit: Payer: Medicaid Other | Admitting: Physical Therapy

## 2016-07-11 ENCOUNTER — Ambulatory Visit (INDEPENDENT_AMBULATORY_CARE_PROVIDER_SITE_OTHER): Payer: Medicaid Other | Admitting: Orthopedic Surgery

## 2016-07-11 ENCOUNTER — Encounter (INDEPENDENT_AMBULATORY_CARE_PROVIDER_SITE_OTHER): Payer: Self-pay | Admitting: Orthopedic Surgery

## 2016-07-11 ENCOUNTER — Encounter: Payer: Self-pay | Admitting: Physical Therapy

## 2016-07-11 DIAGNOSIS — Z89512 Acquired absence of left leg below knee: Secondary | ICD-10-CM

## 2016-07-11 DIAGNOSIS — Z89511 Acquired absence of right leg below knee: Secondary | ICD-10-CM

## 2016-07-11 DIAGNOSIS — R2681 Unsteadiness on feet: Secondary | ICD-10-CM

## 2016-07-11 DIAGNOSIS — R2689 Other abnormalities of gait and mobility: Secondary | ICD-10-CM

## 2016-07-11 NOTE — Patient Instructions (Signed)
Local Driver Evaluation Programs: ° °Comprehensive Evaluation: includes clinical and in vehicle behind the wheel testing by OCCUPATIONAL THERAPIST. Programs have varying levels of adaptive controls available for trial.  ° °[facility] °[address] °[place], [place]  [address] °[phone] or [phone] °http://www.driver-rehab.com °Evaluator:  Cyndee Crompton, OT/CDRS/CDI/SCDCM/Low Vision Certification ° °[facility] °[address] °[place], [place] [address] °[phone] °https://www.[facility].org/home/services/rehabilitation.aspx °Evaluators:  Shannon Sheek, OT and Jill Tucker, OT ° °[facility] [place] [facility] (ONLY SERVES VETERANS!!) °[facility] °[address] °[place], [place]  [address] °[phone] [phone] °http://www.[place].va.gov/services/Physical_Medicine_Rehabilitation_Services.asp °Evaluators:  Eric Andrews, KT; Heidi Harris, KT;  Gary Whitaker, KT (KT=kiniesotherapist) ° ° °Clinical evaluations only:  Includes clinical testing, refers to other programs or local certified driving instructor for behind the wheel testing. ° °[facility] (outpatient Rehab) °[facility] °[address] °[place], [place] [address] °[phone] for scheduling °http://www.[facility].edu/Outpatient-Rehabilitation/Neurorehabilitation-Therapy.htm °Evaluators:  Kelly Lambeth, OT; Kate Phillips, OT ° °Other area clinical evaluators available upon request including [facility], [facility] and [facility]. ° ° °    Resource List °What is a Driver Evaluation: °Your Road Ahead - A Guide to Comprehensive Driving Evaluations °http://www.thehartford.com/resources/mature-market-excellence/publications-on-aging ° °[employer] for [employer] - Disability and Driving Fact Sheets °http://www.aded.net/?page=510 ° °Driving after a Brain  Injury: °[employer] °http://www.biausa.org/tbims-abstracts/if-there-is-an-effective-way-to-determine-if-someone-is-ready-to-drive-after-tbi?A=SearchResult&SearchID=9495675&ObjectID=2758842&ObjectType=35 ° °Driving with Adaptive Equipment: °[facility] Process °http://www.driver-rehab.com/adaptive-equipment ° °[employer] °http://www.nmeda.com/ ° ° ° ° ° ° °  °

## 2016-07-11 NOTE — Progress Notes (Signed)
Office Visit Note   Patient: Ryan [name]           Date of Birth: 1960-08-22           MRN: [id number] Visit Date: 07/11/2016              Requested by: Dessa PhiJosalyn Funches, MD [address] [place], [place] [address] PCP: Lora PaulaFUNCHES, JOSALYN C, MD   Assessment & Plan: Visit Diagnoses:  1. Status post bilateral below knee amputation (HCC)     Plan: Follow-up as needed. Patient was given [facility]. Patient was also given a note that he may return to work without restrictions.  Follow-Up Instructions: Return if symptoms worsen or fail to improve.   Orders:  No orders of the defined types were placed in this encounter.  No orders of the defined types were placed in this encounter.     Procedures: No procedures performed   Clinical Data: No additional findings.   Subjective: Chief Complaint  Patient presents with  . Right Leg - Follow-up  . Follow-up    Patient states that he hopes to be released.  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  Going to physical therapy, states stitches continue to fall out.   Patient states he has been quite active he states he's been able to drive a car he states has been able to go up and down a ladder he has been able to chop wood with an awl. Review of Systems   Objective: Vital Signs: There were no vitals taken for this visit.  Physical Exam examination patient is alert oriented no adenopathy well-dressed normal affect respiratory effort he has a normal gait. Examination his residual limb this is well consolidated there is no drainage no ulceration no signs of infection.  Ortho Exam  Specialty Comments:  No specialty comments available.  Imaging: No results found.   PMFS History: Patient Active Problem List   Diagnosis Date Noted  . Needs flu shot 05/25/2016  . Healthcare maintenance 05/25/2016  . Status post bilateral below knee  amputation (HCC) 02/25/2016  . 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 H/O osteomyelitis 01/14/2016  . Type 2 diabetes mellitus treated without insulin (HCC) 10/14/2015  . Dyslipidemia (high LDL; low HDL) 10/14/2015  . Essential hypertension 06/23/2014  . Diabetic foot (HCC) 05/07/2014   Past Medical History:  Diagnosis Date  . Anemia    low iron  . Arthritis   . Diabetes mellitus without complication (HCC)    type 2  . Diabetic neuropathy (HCC)   . Diabetic neuropathy (HCC)   . Headache    migraines as a child  . Heart murmur    was told "not to worry about it"  . Hypertension   . Osteomyelitis of foot (HCC)   . Peripheral vascular disease (HCC)    "poor circulation" in feet and necrosis    Family History  Problem Relation Age of Onset  . Family history unknown: Yes    Past Surgical History:  Procedure Laterality Date  . AMPUTATION Right 01/13/2014   Procedure: Right great toe amputation;  Surgeon: Jacki Conesonald A Gioffre, MD;  Location: [facility];  Service: Orthopedics;  Laterality: Right;  . AMPUTATION Left 06/08/2014   Procedure: AMPUTATION LEFT GREAT TOE;  Surgeon: Verlee RossettiSteven R Norris, MD;  Location: [facility];  Service: Orthopedics;  Laterality: Left;  .  AMPUTATION Left 07/01/2014   Procedure: LEFT First Metatarsal RAY AMPUTATION ;  Surgeon: Verlee RossettiSteven R Norris, MD;  [facility];  Service: Orthopedics;  Laterality: Left;  . AMPUTATION Left 08/28/2014   Procedure: Left Foot Fifth ray resection;  Surgeon: Kathryne Hitchhristopher Y Blackman, MD;  Location: [facility];  Service: Orthopedics;  Laterality: Left;  . AMPUTATION Left 10/08/2014   Procedure: LEFT FOOT TRANSMETATARSAL AMPUTATION;  Surgeon: Kathryne Hitchhristopher Y Blackman, MD;  [facility];  Service: Orthopedics;  Laterality: Left;  . AMPUTATION Left 12/04/2014   Procedure: AMPUTATION BELOW KNEE;  Surgeon: Nadara MustardMarcus Duda V, MD;  Location: [facility] OR;  Service: Orthopedics;  Laterality: Left;  . AMPUTATION Right  02/25/2016   Procedure: RIGHT BELOW KNEE AMPUTATION;  Surgeon: Nadara MustardMarcus V Duda, MD;  Location: [facility] OR;  Service: Orthopedics;  Laterality: Right;  . arm surgery Left    due to broken arm  . KNEE SURGERY Right    Social History   Occupational History  . Not on file.   Social History Main Topics  . Smoking status: Never Smoker  . Smokeless tobacco: Current User    Types: Snuff  . Alcohol use No     Comment: occ   . Drug use: No  . Sexual activity: Not on file

## 2016-07-12 NOTE — Therapy (Signed)
[facility] [address] [place], [place], [address] Phone: [phone]   Fax:  [phone]  Physical Therapy Treatment  Patient Details  Name: Ryan [name] MRN: [id number] Date of Birth: 1960/10/10 Referring Provider: Aldean Baker, MD  Encounter Date: 07/11/2016      PT End of Session - 07/11/16 1608    Visit Number 4   Number of Visits 9   Authorization Type Medicaid   Authorization Time Period 06/26/2016 - 08/20/2016   Authorization - Visit Number 3   Authorization - Number of Visits 8   PT Start Time 1104   PT Stop Time 1148   PT Time Calculation (min) 44 min   Activity Tolerance Patient tolerated treatment well   Behavior During Therapy [facility] for tasks assessed/performed      Past Medical History:  Diagnosis Date  . Anemia    low iron  . Arthritis   . Diabetes mellitus without complication (HCC)    type 2  . Diabetic neuropathy (HCC)   . Diabetic neuropathy (HCC)   . Headache    migraines as a child  . Heart murmur    was told "not to worry about it"  . Hypertension   . Osteomyelitis of foot (HCC)   . Peripheral vascular disease (HCC)    "poor circulation" in feet and necrosis    Past Surgical History:  Procedure Laterality Date  . AMPUTATION Right 01/13/2014   Procedure: Right great toe amputation;  Surgeon: Jacki Cones, MD;  Location: [facility];  Service: Orthopedics;  Laterality: Right;  . AMPUTATION Left 06/08/2014   Procedure: AMPUTATION LEFT GREAT TOE;  Surgeon: Verlee Rossetti, MD;  Location: [facility];  Service: Orthopedics;  Laterality: Left;  . AMPUTATION Left 07/01/2014   Procedure: LEFT First Metatarsal RAY AMPUTATION ;  Surgeon: Verlee Rossetti, MD;  Location: [facility];  Service: Orthopedics;  Laterality: Left;  . AMPUTATION Left 08/28/2014   Procedure: Left Foot Fifth ray resection;  Surgeon: Kathryne Hitch, MD;  Location: [facility];  Service: Orthopedics;  Laterality: Left;  .  AMPUTATION Left 10/08/2014   Procedure: LEFT FOOT TRANSMETATARSAL AMPUTATION;  Surgeon: Kathryne Hitch, MD;  [facility] OR;  Service: Orthopedics;  Laterality: Left;  . AMPUTATION Left 12/04/2014   Procedure: AMPUTATION BELOW KNEE;  Surgeon: Nadara Mustard, MD;  Location: [facility];  Service: Orthopedics;  Laterality: Left;  . AMPUTATION Right 02/25/2016   Procedure: RIGHT BELOW KNEE AMPUTATION;  Surgeon: Nadara Mustard, MD;  Location: [facility];  Service: Orthopedics;  Laterality: Right;  . arm surgery Left    due to broken arm  . KNEE SURGERY Right     There were no vitals filed for this visit.      Subjective Assessment - 07/11/16 1106    Subjective He is wearing prostheses most of awake hours without issues.    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    Currently in Pain? No/denies                         [facility] Adult PT Treatment/Exercise - 07/11/16 1105      Transfers   Transfers Sit to Stand;Stand to Sit;Floor to Transfer   Sit to Stand 5: Supervision;From chair/3-in-1;With upper extremity assist  chair without armrests   Sit to Stand Details (  indicate cue type and reason) verbal cues on technique chairs without armrests   Stand to Sit 5: Supervision;With upper extremity assist;To chair/3-in-1  chair without armrests   Stand to Sit Details verbal cues on technique chairs without armrests   Floor to Transfer 5: Supervision;With upper extremity assist  progressed from chair/wall support to wall only to floor onl   Floor to Transfer Details (indicate cue type and reason) Verbal cues with wall/chair and wall only for support technique. Progressed to only pushing on floor with PT demo, instructed in technique. Pt return demo with verbal cues leading with both LEs.      Ambulation/Gait   Ambulation/Gait Yes   Ambulation/Gait Assistance 5:  Supervision   Ambulation/Gait Assistance Details verbal cues on posture & step length   Ambulation Distance (Feet) 500 Feet   Assistive device Prostheses   Ambulation Surface Indoor;Level   Stairs Yes   Stairs Assistance 5: Supervision   Stair Management Technique Two rails;Forwards;One rail Right;One rail Left;Alternating pattern   Number of Stairs 4  5 reps   Ramp 5: Supervision  bil. prostheses only   Ramp Details (indicate cue type and reason) verbal cues on posture & wt shift   Curb 5: Supervision  bil. prostheses only   Curb Details (indicate cue type and reason) verbal cues on technique including step lenth / foot position     High Level Balance   High Level Balance Activities Side stepping;Braiding;Direction changes;Turns;Head turns;Figure 8 turns;Negotitating around obstacles;Negotiating over obstacles   High Level Balance Comments verbal cues on technique     Therapeutic Activites    Therapeutic Activities Lifting;Work Public affairs consultantimulation   Lifting lifting 30# box with verbal cues on technique   Work Science writerimulation Simulated shoveling with turn 90* to place "dirt" with pt return demo with verbal cues. Climbing A-frame ladder with verbal cues on technique including safety with positioning to perform 2-handed task on ladder.      Prosthetics   Current prosthetic wear tolerance (days/week)  daily   Current prosthetic wear tolerance (#hours/day)  wearing prostheses all awake hours   Edema none   Residual limb condition  no issues.    Education Provided Residual limb care   Person(s) Educated Patient   Education Method Explanation;Verbal cues   Education Method Verbalized understanding;Verbal cues required;Needs further instruction                  PT Short Term Goals - 06/28/16 2205      PT SHORT TERM GOAL #1   Title Patient tolerates prosthesis wear >90% of awake hours with no new wounds or increase heat rash on limb. (Target Date: 4th treatment after evaluation)    Baseline Patient increased wear of right prosthesis to most of awake hours 2 days prior to evaluation but has 4 small wounds & heat rash placing risk of skin breakdown.    Time 4   Period Weeks   Status On-going     PT SHORT TERM GOAL #2   Title Patient demo proper donning of bilateral prostheses and proper cleaning. (Target Date: 4th treatment after evaluation)   Baseline Patient requires skilled instruction in proper donning & cleaning.    Time 4   Period Weeks   Status On-going     PT SHORT TERM GOAL #3   Title Berg Balance >42/56 to indicate lower fall risk. (Target Date: 4th treatment after evaluation)   Baseline Berg Balance 36/56   Time 4   Period Weeks   Status  On-going     PT SHORT TERM GOAL #4   Title Patient ambulates 500' including grass, ramps, curbs with prostheses only with supervision. (Target Date: 4th treatment after evaluation)   Baseline Patient ambulates 250' with prostheses only with supervision to min guard on indoor surfaces & minA on ramps/ curbs.   Time 4   Period Weeks   Status On-going     PT SHORT TERM GOAL #5   Status On-going           PT Long Term Goals - 06/28/16 2205      PT LONG TERM GOAL #1   Title Patient verbalizes proper prosthetic care including donning, adjusting ply socks, residual limb management & skin integrity to enable safe use of prosthesis. (Target Date: 8th treatment)    Baseline Patient needs skilled instruction for proper use of right prosthesis to minimize skin issues and left prosthesis with new suspension design.    Time 8   Period Weeks   Status On-going     PT LONG TERM GOAL #2   Title Patient tolerates prosthesis wear >90% of awake hours with no skin issues or tenderness. (Target Date: 8th treatment)    Baseline Patient progressed wear of right prosthesis to most of awake hours 2 days before PT eval but skin issues with 4 small wounds & signs of heat rash.    Time 8   Period Weeks   Status On-going     PT LONG  TERM GOAL #3   Title Patient ambulates >1000' including grass, ramps & curbs with prostheses only independently. (Target Date: 8th treatment)    Baseline patient ambulated 250' with prostheses only with close supervision due to gait deviations increasing fall risk especially negotiating around obstacles or scanning environment.    Time 8   Period Weeks   Status On-going     PT LONG TERM GOAL #4   Title Berg Balance test >/= 52/56 to indicate lower fall risk   (Target Date: 8th treatment)    Baseline Berg Balance 36/56   Time 8   Period Weeks   Status On-going     PT LONG TERM GOAL #5   Title Functional Gait Assessment >/=19/30 to indicate lower fall risk with gait. (Target Date: 8th treatment)    Baseline Functional Gait Assessment 14/30   Time 8   Period Weeks   Status On-going     PT LONG TERM GOAL #6   Title Patient performs ADLs/work related tasks including pushing / pulling like mopping, vacuum, lifitng & carrying up to 30#, climbing ladders with prosthesis independently. (Target Date: 8th treatment)    Baseline Patient is dependent in ADLs & work related tasks with bilateral prostheses.   Time 8   Status On-going               Plan - 07/11/16 1619    Clinical Impression Statement Patient improved lifting, carrying and climbing ladder with skilled instruction. He progressed floor transfers to only pushing on floor.    Rehab Potential Good   PT Frequency 1x / week   PT Duration 8 weeks   PT Treatment/Interventions ADLs/Self Care Home Management;DME Instruction;Gait training;Stair training;Functional mobility training;Therapeutic activities;Therapeutic exercise;Balance training;Neuromuscular re-education;Patient/family education;Prosthetic Training   PT Next Visit Plan Review prosthetic care, instruct in HEP for balance. Check STGs.   Consulted and Agree with Plan of Care Patient      Patient will benefit from skilled therapeutic intervention in order to improve the  following deficits  and impairments:  Abnormal gait, Decreased activity tolerance, Decreased balance, Decreased endurance, Decreased knowledge of use of DME, Decreased mobility, Postural dysfunction, Prosthetic Dependency  Visit Diagnosis: Unsteadiness on feet  Other abnormalities of gait and mobility     Problem List Patient Active Problem List   Diagnosis Date Noted  . Needs flu shot 05/25/2016  . Healthcare maintenance 05/25/2016  . Status post bilateral below knee amputation (HCC) 02/25/2016  . 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 H/O osteomyelitis 01/14/2016  . Type 2 diabetes mellitus treated without insulin (HCC) 10/14/2015  . Dyslipidemia (high LDL; low HDL) 10/14/2015  . Essential hypertension 06/23/2014  . Diabetic foot (HCC) 05/07/2014    Grizel Vesely PT, DPT 07/12/2016, 6:21 AM  [facility] [address] [place], [place], [address] Phone: [phone]   Fax:  [phone]  Name: Christiana PellantRonald R Sherfield MRN: [id number] Date of Birth: 06/08/1961

## 2016-07-18 ENCOUNTER — Ambulatory Visit: Payer: Medicaid Other | Admitting: Physical Therapy

## 2016-07-18 ENCOUNTER — Encounter: Payer: Self-pay | Admitting: Physical Therapy

## 2016-07-18 DIAGNOSIS — R2681 Unsteadiness on feet: Secondary | ICD-10-CM | POA: Diagnosis not present

## 2016-07-18 DIAGNOSIS — R2689 Other abnormalities of gait and mobility: Secondary | ICD-10-CM

## 2016-07-18 NOTE — Therapy (Signed)
[facility] [address] [place], [place], [address] Phone: [phone]   Fax:  [phone]  Physical Therapy Treatment  Patient Details  Name: Ryan [name] MRN: [id number] Date of Birth: 1961/01/13 Referring Provider: Meridee Score, MD  Encounter Date: 07/18/2016      PT End of Session - 07/18/16 1225    Visit Number 5   Number of Visits 9   Authorization Type Medicaid   Authorization Time Period 06/26/2016 - 08/20/2016   Authorization - Visit Number 4   Authorization - Number of Visits 8   PT Start Time 2563   PT Stop Time 1100   PT Time Calculation (min) 42 min   Activity Tolerance Patient tolerated treatment well   Behavior During Therapy [facility] for tasks assessed/performed      Past Medical History:  Diagnosis Date  . Anemia    low iron  . Arthritis   . Diabetes mellitus without complication ([place])    type 2  . Diabetic neuropathy ([place])   . Diabetic neuropathy ([place])   . Headache    migraines as a child  . Heart murmur    was told "not to worry about it"  . Hypertension   . Osteomyelitis of foot ([place])   . Peripheral vascular disease ([place])    "poor circulation" in feet and necrosis    Past Surgical History:  Procedure Laterality Date  . AMPUTATION Right 01/13/2014   Procedure: Right great toe amputation;  Surgeon: Tobi Bastos, MD;  Location: [facility];  Service: Orthopedics;  Laterality: Right;  . AMPUTATION Left 06/08/2014   Procedure: AMPUTATION LEFT GREAT TOE;  Surgeon: Augustin Schooling, MD;  [facility];  Service: Orthopedics;  Laterality: Left;  . AMPUTATION Left 07/01/2014   Procedure: LEFT First Metatarsal RAY AMPUTATION ;  Surgeon: Augustin Schooling, MD;  Location: [facility];  Service: Orthopedics;  Laterality: Left;  . AMPUTATION Left 08/28/2014   Procedure: Left Foot Fifth ray resection;  Surgeon: Mcarthur Rossetti, MD;  [facility];  Service: Orthopedics;  Laterality: Left;  .  AMPUTATION Left 10/08/2014   Procedure: LEFT FOOT TRANSMETATARSAL AMPUTATION;  Surgeon: Mcarthur Rossetti, MD;  Location: [facility];  Service: Orthopedics;  Laterality: Left;  . AMPUTATION Left 12/04/2014   Procedure: AMPUTATION BELOW KNEE;  Surgeon: Newt Minion, MD;  Location: [facility];  Service: Orthopedics;  Laterality: Left;  . AMPUTATION Right 02/25/2016   Procedure: RIGHT BELOW KNEE AMPUTATION;  Surgeon: Newt Minion, MD;  Location: [facility];  Service: Orthopedics;  Laterality: Right;  . arm surgery Left    due to broken arm  . KNEE SURGERY Right     There were no vitals filed for this visit.      Subjective Assessment - 07/18/16 1023    Subjective He is wearing prostheses most of awake hours without issues. Dr. Sharol Given cleared him. 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 He placed weights on prostheses to work on endurance (PT advised against this)   Limitations Lifting;Standing;Walking   Patient Stated Goals He would like to be able to return to [place] work and being active in community with 2 prostheses.    Currently in Pain? No/denies            [facility] PT Assessment - 07/18/16 1015      Berg Balance Test   Sit to Stand Able to stand without using hands and stabilize independently   Standing Unsupported Able to  stand safely 2 minutes   Sitting with Back Unsupported but Feet Supported on Floor or Stool Able to sit safely and securely 2 minutes   Stand to Sit Sits safely with minimal use of hands   Transfers Able to transfer safely, minor use of hands   Standing Unsupported with Eyes Closed Able to stand 10 seconds with supervision   Standing Ubsupported with Feet Together Able to place feet together independently and stand for 1 minute with supervision   From Standing, Reach Forward with Outstretched Arm Can reach forward >12 cm safely (5")   From Standing Position, Pick up Object from [place] to pick up shoe safely and easily   From Standing Position, Turn to Look Behind Over  each Shoulder Looks behind one side only/other side shows less weight shift   Turn 360 Degrees Able to turn 360 degrees safely but slowly   Standing Unsupported, Alternately Place Feet on Step/Stool Able to complete 4 steps without aid or supervision   Standing Unsupported, One Foot in Front Able to take small step independently and hold 30 seconds   Standing on One Leg Tries to lift leg/unable to hold 3 seconds but remains standing independently   Total Score 43                     [facility] Adult PT Treatment/Exercise - 07/18/16 1015      Ambulation/Gait   Ambulation/Gait Yes   Ambulation/Gait Assistance 5: Supervision   Ambulation/Gait Assistance Details verbal cues on maintaining path & pace with scanning environment.    Ambulation Distance (Feet) 500 Feet   Assistive device Prostheses   Gait Pattern Step-through pattern;Decreased stride length;Lateral hip instability;Trunk flexed;Wide base of support   Ambulation Surface Indoor;Level;Outdoor;Unlevel;Paved;Gravel;Grass   Ramp 5: Supervision  prostheses only   Ramp Details (indicate cue type and reason) cues on upright posture   Curb 5: Supervision  prostheses only   Curb Details (indicate cue type and reason) cues on technique     Prosthetics   Current prosthetic wear tolerance (days/week)  daily   Current prosthetic wear tolerance (#hours/day)  wearing prostheses all awake hours   Residual limb condition  left LE with superficial blister proximal to fibula head from friction with use of weights on prostheses   Education Provided Skin check   Person(s) Educated Patient   Education Method Explanation;Verbal cues   Education Method Verbalized understanding                PT Education - 07/18/16 1015    Education provided Yes   Education Details straight leg raises with 5# 20 reps each: abduction, adduction, flexion, extension on BLEs. Prostheses placement on pedals for cycling. Standing balance and doorframe  to encourage upright posture.    Person(s) Educated Patient   Methods Explanation;Demonstration;Verbal cues   Comprehension Verbalized understanding;Returned demonstration          PT Short Term Goals - 07/18/16 1225      PT SHORT TERM GOAL #1   Title Patient tolerates prosthesis wear >90% of awake hours with no new wounds or increase heat rash on limb. (Target Date: 4th treatment after evaluation)   Baseline MET 07/18/2016   Time 4   Period Weeks   Status Achieved     PT SHORT TERM GOAL #2   Title Patient demo proper donning of bilateral prostheses and proper cleaning. (Target Date: 4th treatment after evaluation)   Baseline MET 07/18/2016   Time 4  Period Weeks   Status Achieved     PT SHORT TERM GOAL #3   Title Berg Balance >42/56 to indicate lower fall risk. (Target Date: 4th treatment after evaluation)   Baseline MET 07/18/2016   Time 4   Period Weeks   Status Achieved     PT SHORT TERM GOAL #4   Title Patient ambulates 500' including grass, ramps, curbs with prostheses only with supervision. (Target Date: 4th treatment after evaluation)   Baseline MET 07/18/2016   Time 4   Period Weeks   Status Achieved     PT SHORT TERM GOAL #5   Status --           PT Long Term Goals - 06/28/16 2205      PT LONG TERM GOAL #1   Title Patient verbalizes proper prosthetic care including donning, adjusting ply socks, residual limb management & skin integrity to enable safe use of prosthesis. (Target Date: 8th treatment)    Baseline Patient needs skilled instruction for proper use of right prosthesis to minimize skin issues and left prosthesis with new suspension design.    Time 8   Period Weeks   Status On-going     PT LONG TERM GOAL #2   Title Patient tolerates prosthesis wear >90% of awake hours with no skin issues or tenderness. (Target Date: 8th treatment)    Baseline Patient progressed wear of right prosthesis to most of awake hours 2 days before PT eval but skin  issues with 4 small wounds & signs of heat rash.    Time 8   Period Weeks   Status On-going     PT LONG TERM GOAL #3   Title Patient ambulates >1000' including grass, ramps & curbs with prostheses only independently. (Target Date: 8th treatment)    Baseline patient ambulated 250' with prostheses only with close supervision due to gait deviations increasing fall risk especially negotiating around obstacles or scanning environment.    Time 8   Period Weeks   Status On-going     PT LONG TERM GOAL #4   Title Berg Balance test >/= 52/56 to indicate lower fall risk   (Target Date: 8th treatment)    Baseline Berg Balance 36/56   Time 8   Period Weeks   Status On-going     PT LONG TERM GOAL #5   Title Functional Gait Assessment >/=19/30 to indicate lower fall risk with gait. (Target Date: 8th treatment)    Baseline Functional Gait Assessment 14/30   Time 8   Period Weeks   Status On-going     PT LONG TERM GOAL #6   Title Patient performs ADLs/work related tasks including pushing / pulling like mopping, vacuum, lifitng & carrying up to 30#, climbing ladders with prosthesis independently. (Target Date: 8th treatment)    Baseline Patient is dependent in ADLs & work related tasks with bilateral prostheses.   Time 8   Status On-going               Plan - 07/18/16 1233    Clinical Impression Statement Patient met all STGs. He is progressing towards LTGs. He appears to understand concerns with wear of weights on prostheses and will discontinue this.    Rehab Potential Good   PT Frequency 1x / week   PT Duration 8 weeks   PT Treatment/Interventions ADLs/Self Care Home Management;DME Instruction;Gait training;Stair training;Functional mobility training;Therapeutic activities;Therapeutic exercise;Balance training;Neuromuscular re-education;Patient/family education;Prosthetic Training   PT Next Visit Plan Review prosthetic care, instruct  in HEP for balance. Work towards [employer].     Consulted and Agree with Plan of Care Patient      Patient will benefit from skilled therapeutic intervention in order to improve the following deficits and impairments:  Abnormal gait, Decreased activity tolerance, Decreased balance, Decreased endurance, Decreased knowledge of use of DME, Decreased mobility, Postural dysfunction, Prosthetic Dependency  Visit Diagnosis: Unsteadiness on feet  Other abnormalities of gait and mobility     Problem List Patient Active Problem List   Diagnosis Date Noted  . Needs flu shot 05/25/2016  . Healthcare maintenance 05/25/2016  . Status post bilateral below knee amputation ([place]) 02/25/2016  . Diabetic ulcer of right foot associated with type 2 diabetes mellitus ([place]) 01/16/2016  . H/O osteomyelitis 01/14/2016  . Type 2 diabetes mellitus treated without insulin ([place]) 10/14/2015  . Dyslipidemia (high LDL; low HDL) 10/14/2015  . Essential hypertension 06/23/2014  . Diabetic foot ([place]) 05/07/2014    Ossie Yebra PT, DPT 07/18/2016, 12:54 PM  [facility] [address] [place], [place], [address] Phone: [phone]   Fax:  [phone]  Name: Ryan [name] MRN: [id number] Date of Birth: [birth year]

## 2016-07-26 ENCOUNTER — Ambulatory Visit: Payer: Medicaid Other | Admitting: Physical Therapy

## 2016-07-26 ENCOUNTER — Encounter: Payer: Self-pay | Admitting: Physical Therapy

## 2016-07-26 DIAGNOSIS — R2681 Unsteadiness on feet: Secondary | ICD-10-CM

## 2016-07-26 DIAGNOSIS — R2689 Other abnormalities of gait and mobility: Secondary | ICD-10-CM

## 2016-07-26 NOTE — Therapy (Signed)
[facility] [address] [place], [place], [address] Phone: [phone]   Fax:  [phone]  Physical Therapy Treatment  Patient Details  Name: Ryan [name] MRN: [id number] Date of Birth: 24-Feb-1961 Referring Provider: Meridee Score, MD  Encounter Date: 07/26/2016      PT End of Session - 07/26/16 1726    Visit Number 6   Number of Visits 9   Authorization Type Medicaid   Authorization Time Period 06/26/2016 - 08/20/2016   Authorization - Visit Number 5   Authorization - Number of Visits 8   PT Start Time 1016   PT Stop Time 1100   PT Time Calculation (min) 44 min   Activity Tolerance Patient tolerated treatment well   Behavior During Therapy [facility] for tasks assessed/performed      Past Medical History:  Diagnosis Date  . Anemia    low iron  . Arthritis   . Diabetes mellitus without complication ([place])    type 2  . Diabetic neuropathy ([place])   . Diabetic neuropathy ([place])   . Headache    migraines as a child  . Heart murmur    was told "not to worry about it"  . Hypertension   . Osteomyelitis of foot ([place])   . Peripheral vascular disease ([place])    "poor circulation" in feet and necrosis    Past Surgical History:  Procedure Laterality Date  . AMPUTATION Right 01/13/2014   Procedure: Right great toe amputation;  Surgeon: Tobi Bastos, MD;  [facility];  Service: Orthopedics;  Laterality: Right;  . AMPUTATION Left 06/08/2014   Procedure: AMPUTATION LEFT GREAT TOE;  Surgeon: Augustin Schooling, MD;  [facility];  Service: Orthopedics;  Laterality: Left;  . AMPUTATION Left 07/01/2014   Procedure: LEFT First Metatarsal RAY AMPUTATION ;  Surgeon: Augustin Schooling, MD;  Location: [facility];  Service: Orthopedics;  Laterality: Left;  . AMPUTATION Left 08/28/2014   Procedure: Left Foot Fifth ray resection;  Surgeon: Mcarthur Rossetti, MD;  [facility];  Service: Orthopedics;  Laterality: Left;  .  AMPUTATION Left 10/08/2014   Procedure: LEFT FOOT TRANSMETATARSAL AMPUTATION;  Surgeon: Mcarthur Rossetti, MD;  Location: [facility];  Service: Orthopedics;  Laterality: Left;  . AMPUTATION Left 12/04/2014   Procedure: AMPUTATION BELOW KNEE;  Surgeon: Newt Minion, MD;  Location: [facility];  Service: Orthopedics;  Laterality: Left;  . AMPUTATION Right 02/25/2016   Procedure: RIGHT BELOW KNEE AMPUTATION;  Surgeon: Newt Minion, MD;  Location: [place];  Service: Orthopedics;  Laterality: Right;  . arm surgery Left    due to broken arm  . KNEE SURGERY Right     There were no vitals filed for this visit.      Subjective Assessment - 07/26/16 1015    Subjective He is having issues with changing shoes as they pitch him. No falls since last appointment.   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    Currently in Pain? No/denies                         [facility] Adult PT Treatment/Exercise - 07/26/16 1015      Ambulation/Gait   Ambulation/Gait Yes   Ambulation/Gait Assistance 5: Supervision   Ambulation/Gait Assistance Details verbal cues on upright posture, step width and step length.  Ambulation Distance (Feet) 1200 Feet   Assistive device Prostheses   Gait Pattern Step-through pattern;Decreased stride length;Lateral hip instability;Trunk flexed;Wide base of support   Ambulation Surface Indoor;Level;Outdoor;Unlevel;Paved;Grass   Stairs Yes   Stairs Assistance 5: Supervision   Stairs Assistance Details (indicate cue type and reason) cues on reciprocal sequence & foot position   Stair Management Technique Two rails;Alternating pattern;Forwards   Number of Stairs 4  10 reps   Ramp 5: Supervision  prostheses only   Ramp Details (indicate cue type and reason) cues on weight shift   Curb 5: Supervision  prostheses only   Curb Details (indicate cue type and  reason) verbal cues on bil. prostheses     High Level Balance   High Level Balance Activities Side stepping;Braiding;Backward walking;Turns;Head turns;Tandem walking;Negotitating around obstacles;Negotiating over obstacles;Other (comment)  crossovers forward & backward   High Level Balance Comments verbal & tactile cues on balance reactions     Therapeutic Activites    Therapeutic Activities Lifting;Work Psychologist, occupational with verbal cues on technique   Work [employer] manager with turn 90* to place "dirt" with pt return demo with verbal cues. Climbing A-frame ladder with verbal cues on technique including safety with positioning to perform 2-handed task on ladder.      Prosthetics   Prosthetic Care Comments  use of cut-off vs full length socks for volume changes. Use of heel lifts with changing shoes with different heel heights.    Current prosthetic wear tolerance (days/week)  daily   Current prosthetic wear tolerance (#hours/day)  wearing prostheses all awake hours   Current prosthetic weight-bearing tolerance (hours/day)  Pt tolerated standing / gait activities for 15 min with no complaint of pain or discomfort.    Residual limb condition  left LE with superficial blister proximal to fibula head from friction with use of weights on prostheses   Education Provided Skin check;Residual limb care;Correct ply sock adjustment;Other (comment)  see prosthetic care   Person(s) Educated Patient   Education Method Explanation;Demonstration;Verbal cues   Education Method Verbalized understanding;Verbal cues required;Needs further instruction                  PT Short Term Goals - 07/18/16 1225      PT SHORT TERM GOAL #1   Title Patient tolerates prosthesis wear >90% of awake hours with no new wounds or increase heat rash on limb. (Target Date: 4th treatment after evaluation)   Baseline MET 07/18/2016   Time 4   Period Weeks   Status Achieved     PT  SHORT TERM GOAL #2   Title Patient demo proper donning of bilateral prostheses and proper cleaning. (Target Date: 4th treatment after evaluation)   Baseline MET 07/18/2016   Time 4   Period Weeks   Status Achieved     PT SHORT TERM GOAL #3   Title Berg Balance >42/56 to indicate lower fall risk. (Target Date: 4th treatment after evaluation)   Baseline MET 07/18/2016   Time 4   Period Weeks   Status Achieved     PT SHORT TERM GOAL #4   Title Patient ambulates 500' including grass, ramps, curbs with prostheses only with supervision. (Target Date: 4th treatment after evaluation)   Baseline MET 07/18/2016   Time 4   Period Weeks   Status Achieved     PT SHORT TERM GOAL #5   Status --           PT Long Term  Goals - 06/28/16 2205      PT LONG TERM GOAL #1   Title Patient verbalizes proper prosthetic care including donning, adjusting ply socks, residual limb management & skin integrity to enable safe use of prosthesis. (Target Date: 8th treatment)    Baseline Patient needs skilled instruction for proper use of right prosthesis to minimize skin issues and left prosthesis with new suspension design.    Time 8   Period Weeks   Status On-going     PT LONG TERM GOAL #2   Title Patient tolerates prosthesis wear >90% of awake hours with no skin issues or tenderness. (Target Date: 8th treatment)    Baseline Patient progressed wear of right prosthesis to most of awake hours 2 days before PT eval but skin issues with 4 small wounds & signs of heat rash.    Time 8   Period Weeks   Status On-going     PT LONG TERM GOAL #3   Title Patient ambulates >1000' including grass, ramps & curbs with prostheses only independently. (Target Date: 8th treatment)    Baseline patient ambulated 250' with prostheses only with close supervision due to gait deviations increasing fall risk especially negotiating around obstacles or scanning environment.    Time 8   Period Weeks   Status On-going     PT  LONG TERM GOAL #4   Title Berg Balance test >/= 52/56 to indicate lower fall risk   (Target Date: 8th treatment)    Baseline Berg Balance 36/56   Time 8   Period Weeks   Status On-going     PT LONG TERM GOAL #5   Title Functional Gait Assessment >/=19/30 to indicate lower fall risk with gait. (Target Date: 8th treatment)    Baseline Functional Gait Assessment 14/30   Time 8   Period Weeks   Status On-going     PT LONG TERM GOAL #6   Title Patient performs ADLs/work related tasks including pushing / pulling like mopping, vacuum, lifitng & carrying up to 30#, climbing ladders with prosthesis independently. (Target Date: 8th treatment)    Baseline Patient is dependent in ADLs & work related tasks with bilateral prostheses.   Time 8   Status On-going               Plan - 07/26/16 1726    Clinical Impression Statement Patient understands adjusting heel lifts with changing shoes. Patient improved balance reactions with high level balance activities with tactile & verbal cues. Patient's gait improved with skilled instruction.    Rehab Potential Good   PT Frequency 1x / week   PT Duration 8 weeks   PT Treatment/Interventions ADLs/Self Care Home Management;DME Instruction;Gait training;Stair training;Functional mobility training;Therapeutic activities;Therapeutic exercise;Balance training;Neuromuscular re-education;Patient/family education;Prosthetic Training   PT Next Visit Plan Review prosthetic care, Work towards [place].    Consulted and Agree with Plan of Care Patient      Patient will benefit from skilled therapeutic intervention in order to improve the following deficits and impairments:  Abnormal gait, Decreased activity tolerance, Decreased balance, Decreased endurance, Decreased knowledge of use of DME, Decreased mobility, Postural dysfunction, Prosthetic Dependency  Visit Diagnosis: Unsteadiness on feet  Other abnormalities of gait and mobility     Problem  List Patient Active Problem List   Diagnosis Date Noted  . Needs flu shot 05/25/2016  . Healthcare maintenance 05/25/2016  . Status post bilateral below knee amputation ([place]) 02/25/2016  . Diabetic ulcer of right foot associated with type 2 diabetes  mellitus ([place]) 01/16/2016  . H/O osteomyelitis 01/14/2016  . Type 2 diabetes mellitus treated without insulin ([place]) 10/14/2015  . Dyslipidemia (high LDL; low HDL) 10/14/2015  . Essential hypertension 06/23/2014  . Diabetic foot ([place]) 05/07/2014    Anjani Feuerborn PT, DPT 07/26/2016, 5:31 PM  [facility] [address] [place], [place], [address] Phone: [phone]   Fax:  [phone]  Name: Ryan [name] MRN: [id number] Date of Birth: October 27, 1960

## 2016-08-02 ENCOUNTER — Encounter: Payer: Self-pay | Admitting: Physical Therapy

## 2016-08-02 ENCOUNTER — Ambulatory Visit: Payer: Medicaid Other | Attending: Orthopedic Surgery | Admitting: Physical Therapy

## 2016-08-02 DIAGNOSIS — R2689 Other abnormalities of gait and mobility: Secondary | ICD-10-CM | POA: Diagnosis present

## 2016-08-02 DIAGNOSIS — R2681 Unsteadiness on feet: Secondary | ICD-10-CM | POA: Diagnosis present

## 2016-08-02 NOTE — Therapy (Signed)
[facility] [address] [place], [place], [address] Phone: [phone]   Fax:  [phone]  Physical Therapy Treatment  Patient Details  Name: COLLINS KERBY MRN: [id number] Date of Birth: 01/29/1961 Referring Provider: Meridee Score, MD  Encounter Date: 08/02/2016      PT End of Session - 08/02/16 1215    Visit Number 7   Number of Visits 9   Authorization Type Medicaid   Authorization Time Period 06/26/2016 - 08/20/2016   Authorization - Visit Number 6   Authorization - Number of Visits 8   PT Start Time 0998   PT Stop Time 1055   PT Time Calculation (min) 40 min   Activity Tolerance Patient tolerated treatment well   Behavior During Therapy [facility] for tasks assessed/performed      Past Medical History:  Diagnosis Date  . Anemia    low iron  . Arthritis   . Diabetes mellitus without complication ([place])    type 2  . Diabetic neuropathy ([place])   . Diabetic neuropathy ([place])   . Headache    migraines as a child  . Heart murmur    was told "not to worry about it"  . Hypertension   . Osteomyelitis of foot ([place])   . Peripheral vascular disease ([place])    "poor circulation" in feet and necrosis    Past Surgical History:  Procedure Laterality Date  . AMPUTATION Right 01/13/2014   Procedure: Right great toe amputation;  Surgeon: Tobi Bastos, MD;  [facility]: [facility];  Service: Orthopedics;  Laterality: Right;  . AMPUTATION Left 06/08/2014   Procedure: AMPUTATION LEFT GREAT TOE;  Surgeon: Augustin Schooling, MD;  [facility]: [facility];  Service: Orthopedics;  Laterality: Left;  . AMPUTATION Left 07/01/2014   Procedure: LEFT First Metatarsal RAY AMPUTATION ;  Surgeon: Augustin Schooling, MD;  Location: [place];  Service: Orthopedics;  Laterality: Left;  . AMPUTATION Left 08/28/2014   Procedure: Left Foot Fifth ray resection;  Surgeon: Mcarthur Rossetti, MD;  [facility];  Service: Orthopedics;  Laterality: Left;  .  AMPUTATION Left 10/08/2014   Procedure: LEFT FOOT TRANSMETATARSAL AMPUTATION;  Surgeon: Mcarthur Rossetti, MD;  Location: [place];  Service: Orthopedics;  Laterality: Left;  . AMPUTATION Left 12/04/2014   Procedure: AMPUTATION BELOW KNEE;  Surgeon: Newt Minion, MD;  Location: [place];  Service: Orthopedics;  Laterality: Left;  . AMPUTATION Right 02/25/2016   Procedure: RIGHT BELOW KNEE AMPUTATION;  Surgeon: Newt Minion, MD;  Location: [place];  Service: Orthopedics;  Laterality: Right;  . arm surgery Left    due to broken arm  . KNEE SURGERY Right     There were no vitals filed for this visit.      Subjective Assessment - 08/02/16 1017    Subjective (P)  He denies falls or issues with prostheses.    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    Currently in Pain? (P)  No/denies            [facility] PT Assessment - 08/02/16 1015      Observation/Other Assessments   Focus on Therapeutic Outcomes (FOTO)  76.02 Functional Status   Activities of Balance Confidence Scale (ABC Scale)  95.0%  Initial was 78.8%   Fear Avoidance Belief Questionnaire (FABQ)  19 (5)  Initial was  26 (8)     Transfers   Transfers Sit to Stand;Stand to Sit;Floor to Transfer   Sit to Stand 7: Independent;Without upper extremity assist;From chair/3-in-1   Stand to Sit 7: Independent;Without upper extremity assist;To chair/3-in-1   Floor to Transfer 7: Independent;With upper extremity assist     Ambulation/Gait   Ambulation/Gait Yes   Ambulation/Gait Assistance 7: Independent   Ambulation Distance (Feet) 1500 Feet   Assistive device Prostheses   Gait Pattern Within Functional Limits   Ambulation Surface Indoor;Level;Outdoor;Unlevel;Paved;Gravel;Grass   Gait velocity 4.13 ft/sec comfortable & 5.44 ft/sec fast   Stairs Yes   Stairs Assistance 6: Modified independent (Device/Increase  time)   Stairs Assistance Details (indicate cue type and reason) no rails step-to quarter turn & single rail reciprocal   Stair Management Technique One rail Left;One rail Right;Alternating pattern;Forwards;No rails;Step to pattern   Number of Stairs 4  8 reps   Ramp 7: Independent  prostheses only   Curb 7: Independent  prostheses only     Berg Balance Test   Sit to Stand Able to stand without using hands and stabilize independently   Standing Unsupported Able to stand safely 2 minutes   Sitting with Back Unsupported but Feet Supported on Floor or Stool Able to sit safely and securely 2 minutes   Stand to Sit Sits safely with minimal use of hands   Transfers Able to transfer safely, minor use of hands   Standing Unsupported with Eyes Closed Able to stand 10 seconds safely   Standing Ubsupported with Feet Together Able to place feet together independently and stand 1 minute safely   From Standing, Reach Forward with Outstretched Arm Can reach confidently >25 cm (10")   From Standing Position, Pick up Object from Floor Able to pick up shoe safely and easily   From Standing Position, Turn to Look Behind Over each Shoulder Looks behind from both sides and weight shifts well   Turn 360 Degrees Able to turn 360 degrees safely in 4 seconds or less   Standing Unsupported, Alternately Place Feet on Step/Stool Able to stand independently and safely and complete 8 steps in 20 seconds   Standing Unsupported, One Foot in Front Able to plae foot ahead of the other independently and hold 30 seconds   Standing on One Leg Able to lift leg independently and hold equal to or more than 3 seconds   Total Score 53   Berg comment: Initial was 36/56     Functional Gait  Assessment   Gait assessed  Yes   Gait Level Surface Walks 20 ft in less than 5.5 sec, no assistive devices, good speed, no evidence for imbalance, normal gait pattern, deviates no more than 6 in outside of the 12 in walkway width.   Change  in Gait Speed Able to smoothly change walking speed without loss of balance or gait deviation. Deviate no more than 6 in outside of the 12 in walkway width.   Gait with Horizontal Head Turns Performs head turns smoothly with no change in gait. Deviates no more than 6 in outside 12 in walkway width   Gait with Vertical Head Turns Performs head turns with no change in gait. Deviates no more than 6 in outside 12 in walkway width.   Gait and Pivot Turn Pivot turns safely within 3 sec and stops quickly with no loss of balance.   Step Over Obstacle Is able to step over 2 stacked shoe boxes taped together (9 in total height)  without changing gait speed. No evidence of imbalance.   Gait with Narrow Base of Support Ambulates 7-9 steps.   Gait with Eyes Closed Walks 20 ft, no assistive devices, good speed, no evidence of imbalance, normal gait pattern, deviates no more than 6 in outside 12 in walkway width. Ambulates 20 ft in less than 7 sec.   Ambulating Backwards Walks 20 ft, no assistive devices, good speed, no evidence for imbalance, normal gait   Steps Alternating feet, must use rail.   Total Score 28         Prosthetics Assessment - 08/02/16 1015      Prosthetics   Prosthetic Care Independent with Skin check;Residual limb care;Care of non-amputated limb;Prosthetic cleaning;Ply sock cleaning;Correct ply sock adjustment;Proper wear schedule/adjustment;Proper weight-bearing schedule/adjustment   Prosthetic Care Comments  PT instructed in use of foam to cover pylons to decrease "catching" on items   Donning prosthesis  Independent   Doffing prosthesis  Independent   Current prosthetic wear tolerance (days/week)  daily   Current prosthetic wear tolerance (#hours/day)  wearing prostheses all awake hours   Current prosthetic weight-bearing tolerance (hours/day)  >45 minutes without pain or issues.   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                   PT Short Term Goals - 07/18/16 1225      PT SHORT TERM GOAL #1   Title Patient tolerates prosthesis wear >90% of awake hours with no new wounds or increase heat rash on limb. (Target Date: 4th treatment after evaluation)   Baseline MET 07/18/2016   Time 4   Period Weeks   Status Achieved     PT SHORT TERM GOAL #2   Title Patient demo proper donning of bilateral prostheses and proper cleaning. (Target Date: 4th treatment after evaluation)   Baseline MET 07/18/2016   Time 4   Period Weeks   Status Achieved     PT SHORT TERM GOAL #3   Title Berg Balance >42/56 to indicate lower fall risk. (Target Date: 4th treatment after evaluation)   Baseline MET 07/18/2016   Time 4   Period Weeks   Status Achieved     PT SHORT TERM GOAL #4   Title Patient ambulates 500' including grass, ramps, curbs with prostheses only with supervision. (Target Date: 4th treatment after evaluation)   Baseline MET 07/18/2016   Time 4   Period Weeks   Status Achieved     PT SHORT TERM GOAL #5   Status --           PT Long Term Goals - 08/02/16 1211      PT LONG TERM GOAL #1   Title Patient verbalizes proper prosthetic care including donning, adjusting ply socks, residual limb management & skin integrity to enable safe  use of prosthesis. (Target Date: 8th treatment)    Baseline MET 08/02/2016   Time 8   Period Weeks   Status  Achieved     PT LONG TERM GOAL #2   Title Patient tolerates prosthesis wear >90% of awake hours with no skin issues or tenderness. (Target Date: 8th treatment)    Baseline MET 08/02/2016   Time 8   Period Weeks   Status Achieved     PT LONG TERM GOAL #3   Title Patient ambulates >1000' including grass, ramps & curbs with prostheses only independently. (Target Date: 8th treatment)    Baseline MET 08/02/2016   Time 8   Period Weeks   Status Achieved     PT LONG TERM GOAL #4   Title Berg Balance test >/= 52/56 to indicate lower fall risk   (Target Date: 8th treatment)    Baseline MET 08/02/2016 Merrilee Jansky Balance 53/56   Time 8   Period Weeks   Status Achieved     PT LONG TERM GOAL #5   Title Functional Gait Assessment >/=19/30 to indicate lower fall risk with gait. (Target Date: 8th treatment)    Baseline MET 08/02/2016 FGA 28/30   Time 8   Period Weeks   Status Achieved     PT LONG TERM GOAL #6   Title Patient performs ADLs/work related tasks including pushing / pulling like mopping, vacuum, lifitng & carrying up to 30#, climbing ladders with prosthesis independently. (Target Date: 8th treatment)    Baseline MET 08/02/2016   Time 8   Status Achieved               Plan - 08/02/16 1216    Clinical Impression Statement Patient met all 6 LTGs. He appears to be safely functioning at full community level with variable cadence with bilateral prostheses. 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    Rehab Potential Good   PT Frequency 1x / week   PT Duration 8 weeks   PT Treatment/Interventions ADLs/Self Care Home Management;DME Instruction;Gait training;Stair training;Functional mobility training;Therapeutic activities;Therapeutic exercise;Balance training;Neuromuscular re-education;Patient/family education;Prosthetic Training   PT Next Visit Plan discharge   Consulted and Agree with Plan of Care Patient      Patient will benefit from skilled therapeutic intervention  in order to improve the following deficits and impairments:  Abnormal gait, Decreased activity tolerance, Decreased balance, Decreased endurance, Decreased knowledge of use of DME, Decreased mobility, Postural dysfunction, Prosthetic Dependency  Visit Diagnosis: Unsteadiness on feet  Other abnormalities of gait and mobility     Problem List Patient Active Problem List   Diagnosis Date Noted  . Needs flu shot 05/25/2016  . Healthcare maintenance 05/25/2016  . Status post bilateral below knee amputation ([place]) 02/25/2016  . Diabetic ulcer of right foot associated with type 2 diabetes mellitus ([place]) 01/16/2016  . H/O osteomyelitis 01/14/2016  . Type 2 diabetes mellitus treated without insulin ([place]) 10/14/2015  . Dyslipidemia (high LDL; low HDL) 10/14/2015  . Essential hypertension 06/23/2014  . Diabetic foot ([place]) 05/07/2014   PHYSICAL THERAPY DISCHARGE SUMMARY  Visits from Start of Care: 7  Current functional level related to goals / functional outcomes: See above   Remaining deficits: See above   Education / Equipment: Prosthetic care & use  Plan: Patient agrees to discharge.  Patient goals were met. Patient is being discharged due to meeting the stated rehab goals.  ?????       Griffey Nicasio PT, DPT 08/02/2016, 12:18 PM  [place]  [facility] [address] [place], [place], [address] Phone: [phone]   Fax:  [phone]  Name: ORVAN PAPADAKIS MRN: [id number] Date of Birth: 07/15/1961

## 2016-08-09 ENCOUNTER — Encounter: Payer: No Typology Code available for payment source | Admitting: Physical Therapy

## 2016-08-16 ENCOUNTER — Encounter: Payer: No Typology Code available for payment source | Admitting: Physical Therapy

## 2016-08-23 ENCOUNTER — Encounter: Payer: No Typology Code available for payment source | Admitting: Physical Therapy

## 2016-10-16 ENCOUNTER — Telehealth (INDEPENDENT_AMBULATORY_CARE_PROVIDER_SITE_OTHER): Payer: Self-pay | Admitting: Orthopedic Surgery

## 2016-10-16 NOTE — Telephone Encounter (Signed)
ERROR

## 2016-10-18 ENCOUNTER — Ambulatory Visit (INDEPENDENT_AMBULATORY_CARE_PROVIDER_SITE_OTHER): Payer: Medicaid Other | Admitting: Family

## 2016-10-18 DIAGNOSIS — Z89511 Acquired absence of right leg below knee: Secondary | ICD-10-CM

## 2016-10-18 DIAGNOSIS — Z89512 Acquired absence of left leg below knee: Secondary | ICD-10-CM

## 2016-10-18 NOTE — Progress Notes (Signed)
Office Visit Note   Patient: Ryan PellantRonald R [name]           Date of Birth: 02-15-1961           MRN: [id number] Visit Date: 10/18/2016              Requested by: Dessa PhiJosalyn Funches, MD [address] [place], [place] [id number] PCP: Lora PaulaFUNCHES, JOSALYN C, MD  No chief complaint on file.   HPI: The patient is a 56 year old gentleman who presents today for evaluation of bilateral below the knee amputations. He has been walking up to 4 miles daily. Has noticed some rotation in his right socket. Fears right done of the scan as this has been rubbing. He has been having to begin the day with best 6 ply and use up to 20 by the afternoon. Has had modifications to the socket at [facility].   the liners for his left prosthetic are broken down a seam is tearing. There is air leaking into it causing loss of seal.    Assessment & Plan: Visit Diagnoses:  1. Status post bilateral below knee amputation (HCC)     Plan: Have provided an order for new socket for the right as well as new liners for the left. He will follow-up in office as needed.  Follow-Up Instructions: Return if symptoms worsen or fail to improve.   Ortho Exam Physical Exam  Constitutional: Appears well-developed.  Head: Normocephalic.  Eyes: EOM are normal.  Neck: Normal range of motion.  Cardiovascular: Normal rate.   Pulmonary/Chest: Effort normal.  Neurological: Is alert.  Skin: Skin is warm.  Psychiatric: Has a normal mood and affect. bilateral below the knee amputations are well consolidated well healed. There is no erythema and no breakdown of the skin no sign of infection Imaging: No results found.  Labs: Lab Results  Component Value Date   HGBA1C 6.2 05/25/2016   HGBA1C 6.0 01/14/2016   HGBA1C 6.3 10/14/2015   ESRSEDRATE 92 (H) 08/27/2014   ESRSEDRATE 81 (H) 06/08/2014   ESRSEDRATE 55 (H) 05/26/2014   CRP 20.0 (H) 06/08/2014   CRP 2.7 (H) 05/26/2014   CRP 7.1 (H) 03/24/2014   REPTSTATUS 09/03/2014 FINAL 08/27/2014     GRAMSTAIN Rare 05/07/2014   GRAMSTAIN WBC present-predominately Mononuclear 05/07/2014   GRAMSTAIN Rare Squamous Epithelial Cells Present 05/07/2014   GRAMSTAIN Few Gram Positive Cocci In Pairs In Clusters 05/07/2014   GRAMSTAIN Rare Gram Negative Rods 05/07/2014   CULT  08/27/2014    NO GROWTH 5 DAYS Note: Culture results may be compromised due to an excessive volume of blood received in culture bottles. Performed at [facility]    LABORGA METHICILLIN RESISTANT STAPHYLOCOCCUS AUREUS 05/07/2014    Orders:  No orders of the defined types were placed in this encounter.  No orders of the defined types were placed in this encounter.    Procedures: No procedures performed  Clinical Data: No additional findings.  Subjective: Review of Systems  Constitutional: Negative for chills and fever.  Musculoskeletal: Positive for gait problem.    Objective: Vital Signs: There were no vitals taken for this visit.  Specialty Comments:  No specialty comments available.  PMFS History: Patient Active Problem List   Diagnosis Date Noted  . Needs flu shot 05/25/2016  . Healthcare maintenance 05/25/2016  . Status post bilateral below knee amputation (HCC) 02/25/2016  . 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 H/O osteomyelitis 01/14/2016  . Type  2 diabetes mellitus treated without insulin (HCC) 10/14/2015  . Dyslipidemia (high LDL; low HDL) 10/14/2015  . Essential hypertension 06/23/2014  . Diabetic foot (HCC) 05/07/2014   Past Medical History:  Diagnosis Date  . Anemia    low iron  . Arthritis   . Diabetes mellitus without complication (HCC)    type 2  . Diabetic neuropathy (HCC)   . Diabetic neuropathy (HCC)   . Headache    migraines as a child  . Heart murmur    was told "not to worry about it"  . Hypertension   . Osteomyelitis of foot (HCC)   . Peripheral vascular disease (HCC)    "poor circulation" in feet and  necrosis    Family History  Problem Relation Age of Onset  . Family history unknown: Yes    Past Surgical History:  Procedure Laterality Date  . AMPUTATION Right 01/13/2014   Procedure: Right great toe amputation;  Surgeon: Jacki Cones, MD;  [facility];  Service: Orthopedics;  Laterality: Right;  . AMPUTATION Left 06/08/2014   Procedure: AMPUTATION LEFT GREAT TOE;  Surgeon: Verlee Rossetti, MD;  [facility];  Service: Orthopedics;  Laterality: Left;  . AMPUTATION Left 07/01/2014   Procedure: LEFT First Metatarsal RAY AMPUTATION ;  Surgeon: Verlee Rossetti, MD;  Location: [facility];  Service: Orthopedics;  Laterality: Left;  . AMPUTATION Left 08/28/2014   Procedure: Left Foot Fifth ray resection;  Surgeon: Kathryne Hitch, MD;  [facility];  Service: Orthopedics;  Laterality: Left;  . AMPUTATION Left 10/08/2014   Procedure: LEFT FOOT TRANSMETATARSAL AMPUTATION;  Surgeon: Kathryne Hitch, MD;  [facility];  Service: Orthopedics;  Laterality: Left;  . AMPUTATION Left 12/04/2014   Procedure: AMPUTATION BELOW KNEE;  Surgeon: Nadara Mustard, MD;  [facility];  Service: Orthopedics;  Laterality: Left;  . AMPUTATION Right 02/25/2016   Procedure: RIGHT BELOW KNEE AMPUTATION;  Surgeon: Nadara Mustard, MD;  [facility];  Service: Orthopedics;  Laterality: Right;  . arm surgery Left    due to broken arm  . KNEE SURGERY Right    Social History   Occupational History  . Not on file.   Social History Main Topics  . Smoking status: Never Smoker  . Smokeless tobacco: Current User    Types: Snuff  . Alcohol use No     Comment: occ   . Drug use: No  . Sexual activity: Not on file

## 2016-11-28 ENCOUNTER — Encounter (INDEPENDENT_AMBULATORY_CARE_PROVIDER_SITE_OTHER): Payer: Self-pay

## 2016-12-13 ENCOUNTER — Encounter: Payer: Self-pay | Admitting: Family Medicine

## 2017-01-17 ENCOUNTER — Ambulatory Visit: Payer: Medicaid Other | Attending: Internal Medicine | Admitting: Internal Medicine

## 2017-01-17 ENCOUNTER — Encounter: Payer: Self-pay | Admitting: Internal Medicine

## 2017-01-17 VITALS — BP 156/92 | HR 73 | Temp 98.2°F | Resp 14 | Ht 68.0 in | Wt 170.4 lb

## 2017-01-17 DIAGNOSIS — E785 Hyperlipidemia, unspecified: Secondary | ICD-10-CM | POA: Diagnosis not present

## 2017-01-17 DIAGNOSIS — E119 Type 2 diabetes mellitus without complications: Secondary | ICD-10-CM

## 2017-01-17 DIAGNOSIS — Z89511 Acquired absence of right leg below knee: Secondary | ICD-10-CM

## 2017-01-17 DIAGNOSIS — Z23 Encounter for immunization: Secondary | ICD-10-CM | POA: Diagnosis not present

## 2017-01-17 DIAGNOSIS — E1140 Type 2 diabetes mellitus with diabetic neuropathy, unspecified: Secondary | ICD-10-CM | POA: Diagnosis not present

## 2017-01-17 DIAGNOSIS — I10 Essential (primary) hypertension: Secondary | ICD-10-CM | POA: Insufficient documentation

## 2017-01-17 DIAGNOSIS — Z89512 Acquired absence of left leg below knee: Secondary | ICD-10-CM | POA: Diagnosis not present

## 2017-01-17 DIAGNOSIS — E1151 Type 2 diabetes mellitus with diabetic peripheral angiopathy without gangrene: Secondary | ICD-10-CM | POA: Insufficient documentation

## 2017-01-17 DIAGNOSIS — Z79899 Other long term (current) drug therapy: Secondary | ICD-10-CM | POA: Insufficient documentation

## 2017-01-17 LAB — POCT GLYCOSYLATED HEMOGLOBIN (HGB A1C): Hemoglobin A1C: 6.5

## 2017-01-17 NOTE — Progress Notes (Signed)
Ryan [name], is a 56 y.o. male  ESP:[id number]  QJF:[id number]  DOB - 28-Apr-1961  Chief Complaint  Patient presents with  . Follow-up    6 month      Subjective:   Ryan [name] is a 56 y.o. male with history of type 2 diabetes mellitus, diabetic foot and severe PVD s/p bilateral transtibial amputation for chronic osteomyelitis, here today for a routine follow up visit. Patient has no new complaint today. He is doing well with his prosthesis although it is time for new ones. He is adherent to medications, he exercises daily. HbA1C has being between 6.0 and 6.2% on no medication but only on diet and exercise. Patient has No headache, No chest pain, No abdominal pain - No Nausea, No new weakness tingling or numbness, No Cough - SOB.  Problem  S/P Bilateral Bka (Below Knee Amputation) (Hcc)    ALLERGIES: Allergies  Allergen Reactions  . Metformin And Related Other (See Comments)    Pt states that metformin made sugars fall into 30's ? Needs dosage/schedule adjustment ?    PAST MEDICAL HISTORY: Past Medical History:  Diagnosis Date  . Anemia    low iron  . Arthritis   . Diabetes mellitus without complication ([place])    type 2  . Diabetic neuropathy ([place])   . Diabetic neuropathy ([place])   . Headache    migraines as a child  . Heart murmur    was told "not to worry about it"  . Hypertension   . Osteomyelitis of foot ([place])   . Peripheral vascular disease ([place])    "poor circulation" in feet and necrosis    MEDICATIONS AT HOME: Prior to Admission medications   Medication Sig Start Date End Date Taking? Authorizing Provider  Blood Glucose Monitoring Suppl (ACCU-CHEK AVIVA PLUS) w/Device KIT 1 each by Does not apply route 3 (three) times daily. 05/25/16  Yes Angelica Chessman E, MD  glucose blood (ACCU-CHEK ACTIVE STRIPS) test strip Use as instructed 05/25/16  Yes Tresa Garter, MD  Lancet Devices [facility]) lancets Use as instructed 05/25/16  Yes Derris Millan,  Junie Avilla E, MD  ibuprofen (ADVIL,MOTRIN) 400 MG tablet Take 400 mg by mouth every 6 (six) hours as needed.    [provider]  oxyCODONE-acetaminophen (PERCOCET/ROXICET) 5-325 MG tablet Take 1-2 tablets by mouth every 4 (four) hours as needed for severe pain.     [provider]  pravastatin (PRAVACHOL) 20 MG tablet Take 1 tablet (20 mg total) by mouth daily. Patient not taking: Reported on 01/17/2017 05/25/16   Tresa Garter, MD  vitamin B-12 (CYANOCOBALAMIN) 100 MCG tablet Take 100 mcg by mouth daily.    [provider]    Objective:   Vitals:   01/17/17 1143  BP: (!) 156/92  Pulse: 73  Resp: 14  Temp: 98.2 F (36.8 C)  TempSrc: Oral  SpO2: 99%  Weight: 170 lb 6.4 oz (77.3 kg)  Height: 5' 8"  (1.727 m)   Exam General appearance : Awake, alert, not in any distress. Speech Clear. Not toxic looking HEENT: Atraumatic and Normocephalic, pupils equally reactive to light and accomodation Neck: Supple, no JVD. No cervical lymphadenopathy.  Chest: Good air entry bilaterally, no added sounds  CVS: S1 S2 regular, no murmurs.  Abdomen: Bowel sounds present, Non tender and not distended with no gaurding, rigidity or rebound. Extremities:S/P Bilateral BKA,  Neurology: Awake alert, and oriented X 3, CN II-XII intact, Non focal Skin: No Rash  Data Review Lab Results  Component Value Date   HGBA1C 6.5 01/17/2017   HGBA1C 6.2 05/25/2016   HGBA1C 6.0 01/14/2016    Assessment & Plan   1. Dyslipidemia (high LDL; low HDL)  - CMP14+EGFR - Lipid panel - TSH  2. Essential hypertension  We have discussed target BP range and blood pressure goal. I have advised patient to check BP regularly and to call us back or report to clinic if the numbers are consistently higher than 140/90. We discussed the importance of compliance with medical therapy and DASH diet recommended, consequences of uncontrolled hypertension discussed.  - continue current BP  medications  3. S/P bilateral BKA (below knee amputation) ([place])  - Continue use of prosthesis  - Prosthetics Care emphasized  4. Type 2 diabetes mellitus treated without insulin ([place])  - POCT A1C  Patient have been counseled extensively about nutrition and exercise. Other issues discussed during this visit include: low cholesterol diet, weight control and daily exercise, foot care, annual eye examinations at Ophthalmology, importance of adherence with medications and regular follow-up. We also discussed long term complications of uncontrolled diabetes and hypertension.   Return in about 6 months (around 07/19/2017) for Hemoglobin A1C and Follow up, DM, Follow up HTN.  The patient was given clear instructions to go to ER or return to medical center if symptoms don't improve, worsen or new problems develop. The patient verbalized understanding. The patient was told to call to get lab results if they haven't heard anything in the next week.   This note has been created with Surveyor, quantity. Any transcriptional errors are unintentional.    Angelica Chessman, MD, [place], [place], [place], [place] and [place], [place]   01/17/2017, 12:38 PM

## 2017-01-17 NOTE — Patient Instructions (Signed)
Diabetes Mellitus and Food It is important for you to manage your blood sugar (glucose) level. Your blood glucose level can be greatly affected by what you eat. Eating healthier foods in the appropriate amounts throughout the day at about the same time each day will help you control your blood glucose level. It can also help slow or prevent worsening of your diabetes mellitus. Healthy eating may even help you improve the level of your blood pressure and reach or maintain a healthy weight. General recommendations for healthful eating and cooking habits include:  Eating meals and snacks regularly. Avoid going long periods of time without eating to lose weight.  Eating a diet that consists mainly of plant-based foods, such as fruits, vegetables, nuts, legumes, and whole grains.  Using low-heat cooking methods, such as baking, instead of high-heat cooking methods, such as deep frying.  Work with your dietitian to make sure you understand how to use the Nutrition Facts information on food labels. How can food affect me? Carbohydrates Carbohydrates affect your blood glucose level more than any other type of food. Your dietitian will help you determine how many carbohydrates to eat at each meal and teach you how to count carbohydrates. Counting carbohydrates is important to keep your blood glucose at a healthy level, especially if you are using insulin or taking certain medicines for diabetes mellitus. Alcohol Alcohol can cause sudden decreases in blood glucose (hypoglycemia), especially if you use insulin or take certain medicines for diabetes mellitus. Hypoglycemia can be a life-threatening condition. Symptoms of hypoglycemia (sleepiness, dizziness, and disorientation) are similar to symptoms of having too much alcohol. If your health care provider has given you approval to drink alcohol, do so in moderation and use the following guidelines:  Women should not have more than one drink per day, and men  should not have more than two drinks per day. One drink is equal to: ? 12 oz of beer. ? 5 oz of wine. ? 1 oz of hard liquor.  Do not drink on an empty stomach.  Keep yourself hydrated. Have water, diet soda, or unsweetened iced tea.  Regular soda, juice, and other mixers might contain a lot of carbohydrates and should be counted.  What foods are not recommended? As you make food choices, it is important to remember that all foods are not the same. Some foods have fewer nutrients per serving than other foods, even though they might have the same number of calories or carbohydrates. It is difficult to get your body what it needs when you eat foods with fewer nutrients. Examples of foods that you should avoid that are high in calories and carbohydrates but low in nutrients include:  Trans fats (most processed foods list trans fats on the Nutrition Facts label).  Regular soda.  Juice.  Candy.  Sweets, such as cake, pie, doughnuts, and cookies.  Fried foods.  What foods can I eat? Eat nutrient-rich foods, which will nourish your body and keep you healthy. The food you should eat also will depend on several factors, including:  The calories you need.  The medicines you take.  Your weight.  Your blood glucose level.  Your blood pressure level.  Your cholesterol level.  You should eat a variety of foods, including:  Protein. ? Lean cuts of meat. ? Proteins low in saturated fats, such as fish, egg whites, and beans. Avoid processed meats.  Fruits and vegetables. ? Fruits and vegetables that may help control blood glucose levels, such as apples,   mangoes, and yams.  Dairy products. ? Choose fat-free or low-fat dairy products, such as milk, yogurt, and cheese.  Grains, bread, pasta, and rice. ? Choose whole grain products, such as multigrain bread, whole oats, and brown rice. These foods may help control blood pressure.  Fats. ? Foods containing healthful fats, such as  nuts, avocado, olive oil, canola oil, and fish.  Does everyone with diabetes mellitus have the same meal plan? Because every person with diabetes mellitus is different, there is not one meal plan that works for everyone. It is very important that you meet with a dietitian who will help you create a meal plan that is just right for you. This information is not intended to replace advice given to you by your health care provider. Make sure you discuss any questions you have with your health care provider. Document Released: 04/13/2005 Document Revised: 12/23/2015 Document Reviewed: 06/13/2013 Elsevier Interactive Patient Education  2017 Elsevier Inc. Blood Glucose Monitoring, Adult Monitoring your blood sugar (glucose) helps you manage your diabetes. It also helps you and your health care provider determine how well your diabetes management plan is working. Blood glucose monitoring involves checking your blood glucose as often as directed, and keeping a record (log) of your results over time. Why should I monitor my blood glucose? Checking your blood glucose regularly can:  Help you understand how food, exercise, illnesses, and medicines affect your blood glucose.  Let you know what your blood glucose is at any time. You can quickly tell if you are having low blood glucose (hypoglycemia) or high blood glucose (hyperglycemia).  Help you and your health care provider adjust your medicines as needed.  When should I check my blood glucose? Follow instructions from your health care provider about how often to check your blood glucose. This may depend on:  The type of diabetes you have.  How well-controlled your diabetes is.  Medicines you are taking.  If you have type 1 diabetes:  Check your blood glucose at least 2 times a day.  Also check your blood glucose: ? Before every insulin injection. ? Before and after exercise. ? Between meals. ? 2 hours after a meal. ? Occasionally between  2:00 a.m. and 3:00 a.m., as directed. ? Before potentially dangerous tasks, like driving or using heavy machinery. ? At bedtime.  You may need to check your blood glucose more often, up to 6-10 times a day: ? If you use an insulin pump. ? If you need multiple daily injections (MDI). ? If your diabetes is not well-controlled. ? If you are ill. ? If you have a history of severe hypoglycemia. ? If you have a history of not knowing when your blood glucose is getting low (hypoglycemia unawareness). If you have type 2 diabetes:  If you take insulin or other diabetes medicines, check your blood glucose at least 2 times a day.  If you are on intensive insulin therapy, check your blood glucose at least 4 times a day. Occasionally, you may also need to check between 2:00 a.m. and 3:00 a.m., as directed.  Also check your blood glucose: ? Before and after exercise. ? Before potentially dangerous tasks, like driving or using heavy machinery.  You may need to check your blood glucose more often if: ? Your medicine is being adjusted. ? Your diabetes is not well-controlled. ? You are ill. What is a blood glucose log?  A blood glucose log is a record of your blood glucose readings. It helps you   and your health care provider: ? Look for patterns in your blood glucose over time. ? Adjust your diabetes management plan as needed.  Every time you check your blood glucose, write down your result and notes about things that may be affecting your blood glucose, such as your diet and exercise for the day.  Most glucose meters store a record of glucose readings in the meter. Some meters allow you to download your records to a computer. How do I check my blood glucose? Follow these steps to get accurate readings of your blood glucose: Supplies needed   Blood glucose meter.  Test strips for your meter. Each meter has its own strips. You must use the strips that come with your meter.  A needle to prick  your finger (lancet). Do not use lancets more than once.  A device that holds the lancet (lancing device).  A journal or log book to write down your results. Procedure  Wash your hands with soap and water.  Prick the side of your finger (not the tip) with the lancet. Use a different finger each time.  Gently rub the finger until a small drop of blood appears.  Follow instructions that come with your meter for inserting the test strip, applying blood to the strip, and using your blood glucose meter.  Write down your result and any notes. Alternative testing sites  Some meters allow you to use areas of your body other than your finger (alternative sites) to test your blood.  If you think you may have hypoglycemia, or if you have hypoglycemia unawareness, do not use alternative sites. Use your finger instead.  Alternative sites may not be as accurate as the fingers, because blood flow is slower in these areas. This means that the result you get may be delayed, and it may be different from the result that you would get from your finger.  The most common alternative sites are: ? Forearm. ? Thigh. ? Palm of the hand. Additional tips  Always keep your supplies with you.  If you have questions or need help, all blood glucose meters have a 24-hour "hotline" number that you can call. You may also contact your health care provider.  After you use a few boxes of test strips, adjust (calibrate) your blood glucose meter by following instructions that came with your meter. This information is not intended to replace advice given to you by your health care provider. Make sure you discuss any questions you have with your health care provider. Document Released: 07/20/2003 Document Revised: 02/04/2016 Document Reviewed: 12/27/2015 Elsevier Interactive Patient Education  2017 Elsevier Inc.  

## 2017-01-18 LAB — CMP14+EGFR
ALBUMIN: 4 g/dL (ref 3.5–5.5)
ALT: 14 IU/L (ref 0–44)
AST: 17 IU/L (ref 0–40)
Albumin/Globulin Ratio: 1.5 (ref 1.2–2.2)
Alkaline Phosphatase: 58 IU/L (ref 39–117)
BILIRUBIN TOTAL: 0.3 mg/dL (ref 0.0–1.2)
BUN/Creatinine Ratio: 15 (ref 9–20)
BUN: 15 mg/dL (ref 6–24)
CALCIUM: 9.5 mg/dL (ref 8.7–10.2)
CHLORIDE: 106 mmol/L (ref 96–106)
CO2: 22 mmol/L (ref 20–29)
CREATININE: 0.99 mg/dL (ref 0.76–1.27)
GFR, EST AFRICAN AMERICAN: 99 mL/min/{1.73_m2} (ref >59)
GFR, EST NON AFRICAN AMERICAN: 86 mL/min/{1.73_m2} (ref >59)
GLUCOSE: 126 mg/dL — AB (ref 65–99)
Globulin, Total: 2.7 g/dL (ref 1.5–4.5)
Potassium: 4.8 mmol/L (ref 3.5–5.2)
Sodium: 142 mmol/L (ref 134–144)
Total Protein: 6.7 g/dL (ref 6.0–8.5)

## 2017-01-18 LAB — LIPID PANEL
Chol/HDL Ratio: 4.4 ratio (ref 0.0–5.0)
Cholesterol, Total: 203 mg/dL — ABNORMAL HIGH (ref 100–199)
HDL: 46 mg/dL (ref >39)
LDL CALC: 142 mg/dL — AB (ref 0–99)
Triglycerides: 74 mg/dL (ref 0–149)
VLDL CHOLESTEROL CAL: 15 mg/dL (ref 5–40)

## 2017-01-18 LAB — TSH: TSH: 0.599 u[IU]/mL (ref 0.450–4.500)

## 2017-02-22 ENCOUNTER — Telehealth: Payer: Self-pay | Admitting: *Deleted

## 2017-02-22 NOTE — Telephone Encounter (Signed)
Medical Assistant left message on patient's home and cell voicemail. Voicemail states to give a call back to [place] with [facility] at [phone]. Patient is aware of cholesterol being high and needing to restart pravastatin. Patient is advised to limit saturated fat intake and increase fiber and exercise as tolerate.

## 2017-02-22 NOTE — Telephone Encounter (Signed)
-----   Message from Quentin Angstlugbemiga E Jegede, MD sent at 01/23/2017  9:47 AM EDT ----- Please inform patient that his cholesterol is still high, otherwise lab results are mostly normal. Please restart Pravastatin as prescribed and also please limit saturated fat to no more than 7% of your calories, limit cholesterol to 200 mg/day, increase fiber and exercise as tolerated.

## 2017-05-21 ENCOUNTER — Ambulatory Visit (INDEPENDENT_AMBULATORY_CARE_PROVIDER_SITE_OTHER): Payer: Medicare Other | Admitting: Family

## 2017-05-21 ENCOUNTER — Encounter (INDEPENDENT_AMBULATORY_CARE_PROVIDER_SITE_OTHER): Payer: Self-pay | Admitting: Family

## 2017-05-21 DIAGNOSIS — Z89512 Acquired absence of left leg below knee: Secondary | ICD-10-CM

## 2017-05-21 DIAGNOSIS — Z89511 Acquired absence of right leg below knee: Secondary | ICD-10-CM | POA: Diagnosis not present

## 2017-05-21 DIAGNOSIS — E119 Type 2 diabetes mellitus without complications: Secondary | ICD-10-CM

## 2017-05-21 NOTE — Progress Notes (Signed)
Office Visit Note   Patient: Ryan PellantRonald R Murren           Date of Birth: Aug 21, 1960           MRN: [id number] Visit Date: 05/21/2017              Requested by: Quentin AngstJegede, Olugbemiga E, MD [address] [place], [place] [id number] PCP: Quentin AngstJegede, Olugbemiga E, MD  Chief Complaint  Patient presents with  . Right Leg - Follow-up  . Left Leg - Follow-up      HPI: The patient is a 56 year old gentleman who presents today for bilateral prosthetic and residual limb evaluation. 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 Today is requesting order for new sockets bilaterally. Has been having issues with ulceration to bilateral residual limbs. States sockets have been rotating due to volume loss. Has had attempts and modifications with padding to prevent. Continues to have issues. Has issues with sweating, has tried roll on antiperspirant without relief. Complains of pain from ill fitting prostheses and rubbing.   Is currently a K3 level ambulator. Is hoping to upgrade to a K4.   Is interested in a vac seal liner. Is also interested in breathable liners.   States has tried remolding his sockets with hot water this helps for a little while.   Assessment & Plan: Visit Diagnoses:  1. S/P bilateral BKA (below knee amputation) (HCC)   2. Type 2 diabetes mellitus treated without insulin (HCC)     Plan: provided an order for new sockets bilaterally. May take to the prosthetist of his choice. Follow up in office as needed.   Follow-Up Instructions: Return if symptoms worsen or fail to improve.   Ortho Exam  Patient is alert, oriented, no adenopathy, well-dressed, normal affect, normal respiratory effort. On examination of bilateral residual limbs. Right is well healed. No open areas. No ulcerations. No callus. Is well consolidated. Left residual limb with medial ulceration to knee is 2 mm in diameter and superficial. No drainage. No cellulitis.  No sign of infection. Is well consolidated.   Imaging: No results found. No images are attached to the encounter.  Labs: Lab Results  Component Value Date   HGBA1C 6.5 01/17/2017   HGBA1C 6.2 05/25/2016   HGBA1C 6.0 01/14/2016   ESRSEDRATE 92 (H) 08/27/2014   ESRSEDRATE 81 (H) 06/08/2014   ESRSEDRATE 55 (H) 05/26/2014   CRP 20.0 (H) 06/08/2014   CRP 2.7 (H) 05/26/2014   CRP 7.1 (H) 03/24/2014   REPTSTATUS 09/03/2014 FINAL 08/27/2014   GRAMSTAIN Rare 05/07/2014   GRAMSTAIN WBC present-predominately Mononuclear 05/07/2014   GRAMSTAIN Rare Squamous Epithelial Cells Present 05/07/2014   GRAMSTAIN Few Gram Positive Cocci In Pairs In Clusters 05/07/2014   GRAMSTAIN Rare Gram Negative Rods 05/07/2014   CULT  08/27/2014    NO GROWTH 5 DAYS Note: Culture results may be compromised due to an excessive volume of blood received in culture bottles. Performed at [facility]    [facility]ORGA METHICILLIN RESISTANT STAPHYLOCOCCUS AUREUS 05/07/2014    Orders:  No orders of the defined types were placed in this encounter.  No orders of the defined types were placed in this encounter.    Procedures: No procedures performed  Clinical Data: No additional findings.  ROS:  All other systems negative, except as noted in the HPI. Review of Systems  Constitutional: Negative for chills and fever.  Cardiovascular: Negative for leg swelling.  Skin: Positive  for wound. Negative for color change.    Objective: Vital Signs: There were no vitals taken for this visit.  Specialty Comments:  No specialty comments available.  PMFS History: Patient Active Problem List   Diagnosis Date Noted  . Needs flu shot 05/25/2016  . Healthcare maintenance 05/25/2016  . S/P bilateral BKA (below knee amputation) (HCC) 02/25/2016  . 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 H/O osteomyelitis 01/14/2016  . Type 2 diabetes mellitus treated without  insulin (HCC) 10/14/2015  . Dyslipidemia (high LDL; low HDL) 10/14/2015  . Essential hypertension 06/23/2014  . Diabetic foot (HCC) 05/07/2014   Past Medical History:  Diagnosis Date  . Anemia    low iron  . Arthritis   . Diabetes mellitus without complication (HCC)    type 2  . Diabetic neuropathy (HCC)   . Diabetic neuropathy (HCC)   . Headache    migraines as a child  . Heart murmur    was told "not to worry about it"  . Hypertension   . Osteomyelitis of foot (HCC)   . Peripheral vascular disease (HCC)    "poor circulation" in feet and necrosis    Family History  Problem Relation Age of Onset  . Family history unknown: Yes    Past Surgical History:  Procedure Laterality Date  . AMPUTATION Right 01/13/2014   Procedure: Right great toe amputation;  Surgeon: Jacki Cones, MD;  Location: [facility];  Service: Orthopedics;  Laterality: Right;  . AMPUTATION Left 06/08/2014   Procedure: AMPUTATION LEFT GREAT TOE;  Surgeon: Verlee Rossetti, MD;  Location: [facility];  Service: Orthopedics;  Laterality: Left;  . AMPUTATION Left 07/01/2014   Procedure: LEFT First Metatarsal RAY AMPUTATION ;  Surgeon: Verlee Rossetti, MD;  Location: [facility] OR;  Service: Orthopedics;  Laterality: Left;  . AMPUTATION Left 08/28/2014   Procedure: Left Foot Fifth ray resection;  Surgeon: Kathryne Hitch, MD;  Location: [facility];  Service: Orthopedics;  Laterality: Left;  . AMPUTATION Left 10/08/2014   Procedure: LEFT FOOT TRANSMETATARSAL AMPUTATION;  Surgeon: Kathryne Hitch, MD;  Location: [facility] OR;  Service: Orthopedics;  Laterality: Left;  . AMPUTATION Left 12/04/2014   Procedure: AMPUTATION BELOW KNEE;  Surgeon: Nadara Mustard, MD;  Location: [facility] OR;  Service: Orthopedics;  Laterality: Left;  . AMPUTATION Right 02/25/2016   Procedure: RIGHT BELOW KNEE AMPUTATION;  Surgeon: Nadara Mustard, MD;  Location: [facility] OR;  Service: Orthopedics;  Laterality: Right;  . arm surgery Left    due to broken arm  . KNEE  SURGERY Right    Social History   Occupational History  . Not on file.   Social History Main Topics  . Smoking status: Never Smoker  . Smokeless tobacco: Current User    Types: Snuff  . Alcohol use No     Comment: occ   . Drug use: No  . Sexual activity: Not on file

## 2017-06-04 ENCOUNTER — Other Ambulatory Visit: Payer: Self-pay | Admitting: Internal Medicine

## 2017-06-06 NOTE — Telephone Encounter (Signed)
This encounter was created in error - please disregard.

## 2017-08-09 ENCOUNTER — Telehealth (INDEPENDENT_AMBULATORY_CARE_PROVIDER_SITE_OTHER): Payer: Self-pay | Admitting: Family

## 2017-08-09 NOTE — Telephone Encounter (Signed)
05/21/2017 OV NOTE FAXED TO [facility] [phone]

## 2017-08-23 ENCOUNTER — Telehealth (INDEPENDENT_AMBULATORY_CARE_PROVIDER_SITE_OTHER): Payer: Self-pay | Admitting: Orthopedic Surgery

## 2017-08-23 NOTE — Telephone Encounter (Signed)
Shameeka with [facility] called advised she faxed over a PPA form for Erin to complete and have faxed back to her this morning. The number to contact Rayburn MaShameeka is [phone]

## 2017-08-24 NOTE — Telephone Encounter (Signed)
Faxed this morning.

## 2017-10-08 ENCOUNTER — Other Ambulatory Visit: Payer: Self-pay | Admitting: Internal Medicine

## 2017-12-27 ENCOUNTER — Other Ambulatory Visit: Payer: Self-pay | Admitting: Internal Medicine

## 2018-01-23 ENCOUNTER — Other Ambulatory Visit: Payer: Self-pay | Admitting: Family Medicine

## 2018-02-05 LAB — HM DIABETES EYE EXAM

## 2018-05-30 ENCOUNTER — Ambulatory Visit (INDEPENDENT_AMBULATORY_CARE_PROVIDER_SITE_OTHER): Payer: 59 | Admitting: Orthopedic Surgery

## 2018-05-30 ENCOUNTER — Encounter (INDEPENDENT_AMBULATORY_CARE_PROVIDER_SITE_OTHER): Payer: Self-pay | Admitting: Orthopedic Surgery

## 2018-05-30 VITALS — Ht 68.0 in | Wt 170.0 lb

## 2018-05-30 DIAGNOSIS — Z89512 Acquired absence of left leg below knee: Secondary | ICD-10-CM | POA: Diagnosis not present

## 2018-05-30 DIAGNOSIS — Z89511 Acquired absence of right leg below knee: Secondary | ICD-10-CM | POA: Diagnosis not present

## 2018-06-04 ENCOUNTER — Encounter (INDEPENDENT_AMBULATORY_CARE_PROVIDER_SITE_OTHER): Payer: Self-pay | Admitting: Orthopedic Surgery

## 2018-06-04 NOTE — Progress Notes (Signed)
Office Visit Note   Patient: Ryan [name]           Date of Birth: Jul 16, 1961           MRN: [id number] Visit Date: 05/30/2018              Requested by: Quentin Angst, MD [address] [place], [place] [address] PCP: Quentin Angst, MD  Chief Complaint  Patient presents with  . Left Leg - Wound Check    BKA  [facility]       HPI: Patient is a 57 year old gentleman status post left transtibial amputation who complains of a blister over the tip of the residual limb he has been using Tegaderm.  Patient is followed by [facility] for his prosthesis.  Patient is a bilateral amputee.  He complains of swelling and redness over the residual limb on the left.  Assessment & Plan: Visit Diagnoses:  1. S/P bilateral BKA (below knee amputation) (HCC)     Plan: Recommended DuoDERM for healing discontinue the Tegaderm.  Patient is given a prescription for [facility] for prosthetic socket adjustments to provide more proximal loading then the end bearing loading on the residual limb.  Follow-Up Instructions: Return in about 4 weeks (around 06/27/2018).   Ortho Exam  Patient is alert, oriented, no adenopathy, well-dressed, normal affect, normal respiratory effort. Examination patient has good consolidation of the residual limb there is no cellulitis no signs of infection.  He does have a blister from N bearing on the residual limb secondary to subsidence in the socket with a socket that is too big for his leg.  Imaging: No results found. No images are attached to the encounter.  Labs: Lab Results  Component Value Date   HGBA1C 6.5 01/17/2017   HGBA1C 6.2 05/25/2016   HGBA1C 6.0 01/14/2016   ESRSEDRATE 92 (H) 08/27/2014   ESRSEDRATE 81 (H) 06/08/2014   ESRSEDRATE 55 (H) 05/26/2014   CRP 20.0 (H) 06/08/2014   CRP 2.7 (H) 05/26/2014   CRP 7.1 (H) 03/24/2014   REPTSTATUS 09/03/2014 FINAL 08/27/2014   GRAMSTAIN Rare 05/07/2014   GRAMSTAIN WBC present-predominately Mononuclear  05/07/2014   GRAMSTAIN Rare Squamous Epithelial Cells Present 05/07/2014   GRAMSTAIN Few Gram Positive Cocci In Pairs In Clusters 05/07/2014   GRAMSTAIN Rare Gram Negative Rods 05/07/2014   CULT  08/27/2014    NO GROWTH 5 DAYS Note: Culture results may be compromised due to an excessive volume of blood received in culture bottles. Performed at [facility] METHICILLIN RESISTANT STAPHYLOCOCCUS AUREUS 05/07/2014     Lab Results  Component Value Date   ALBUMIN 4.0 01/17/2017   ALBUMIN 2.9 (L) 08/28/2014   ALBUMIN 2.8 (L) 06/08/2014    Body mass index is 25.85 kg/m.  Orders:  No orders of the defined types were placed in this encounter.  No orders of the defined types were placed in this encounter.    Procedures: No procedures performed  Clinical Data: No additional findings.  ROS:  All other systems negative, except as noted in the HPI. Review of Systems  Objective: Vital Signs: Ht 5\' 8"  (1.727 m)   Wt 170 lb (77.1 kg)   BMI 25.85 kg/m   Specialty Comments:  No specialty comments available.  PMFS History: Patient Active Problem List   Diagnosis Date Noted  . Needs flu shot 05/25/2016  . Healthcare maintenance 05/25/2016  . S/P bilateral BKA (below knee amputation) (HCC) 02/25/2016  . Diabetic ulcer of  right foot associated with type 2 diabetes mellitus (HCC) 01/16/2016  . H/O osteomyelitis 01/14/2016  . Type 2 diabetes mellitus treated without insulin (HCC) 10/14/2015  . Dyslipidemia (high LDL; low HDL) 10/14/2015  . Essential hypertension 06/23/2014  . Diabetic foot (HCC) 05/07/2014   Past Medical History:  Diagnosis Date  . Anemia    low iron  . Arthritis   . Diabetes mellitus without complication (HCC)    type 2  . Diabetic neuropathy (HCC)   . Diabetic neuropathy (HCC)   . Headache    migraines as a child  . Heart murmur    was told "not to worry about it"  . Hypertension   . Osteomyelitis of foot (HCC)   . Peripheral  vascular disease (HCC)    "poor circulation" in feet and necrosis    Family History  Family history unknown: Yes    Past Surgical History:  Procedure Laterality Date  . AMPUTATION Right 01/13/2014   Procedure: Right great toe amputation;  Surgeon: Jacki Cones, MD;  Location: [facility];  Service: Orthopedics;  Laterality: Right;  . AMPUTATION Left 06/08/2014   Procedure: AMPUTATION LEFT GREAT TOE;  Surgeon: Verlee Rossetti, MD;  Location: [facility];  Service: Orthopedics;  Laterality: Left;  . AMPUTATION Left 07/01/2014   Procedure: LEFT First Metatarsal RAY AMPUTATION ;  Surgeon: Verlee Rossetti, MD;  Location: [facility] OR;  Service: Orthopedics;  Laterality: Left;  . AMPUTATION Left 08/28/2014   Procedure: Left Foot Fifth ray resection;  Surgeon: Kathryne Hitch, MD;  Location: [facility];  Service: Orthopedics;  Laterality: Left;  . AMPUTATION Left 10/08/2014   Procedure: LEFT FOOT TRANSMETATARSAL AMPUTATION;  Surgeon: Kathryne Hitch, MD;  Location: [facility];  Service: Orthopedics;  Laterality: Left;  . AMPUTATION Left 12/04/2014   Procedure: AMPUTATION BELOW KNEE;  Surgeon: Nadara Mustard, MD;  Location: [facility] OR;  Service: Orthopedics;  Laterality: Left;  . AMPUTATION Right 02/25/2016   Procedure: RIGHT BELOW KNEE AMPUTATION;  Surgeon: Nadara Mustard, MD;  Location: [facility] OR;  Service: Orthopedics;  Laterality: Right;  . arm surgery Left    due to broken arm  . KNEE SURGERY Right    Social History   Occupational History  . Not on file  Tobacco Use  . Smoking status: Never Smoker  . Smokeless tobacco: Current User    Types: Snuff  Substance and Sexual Activity  . Alcohol use: No    Alcohol/week: 0.0 standard drinks    Comment: occ   . Drug use: No  . Sexual activity: Not on file

## 2018-06-05 ENCOUNTER — Ambulatory Visit (INDEPENDENT_AMBULATORY_CARE_PROVIDER_SITE_OTHER): Payer: Medicare Other | Admitting: Family

## 2019-01-31 ENCOUNTER — Other Ambulatory Visit: Payer: Self-pay | Admitting: Internal Medicine

## 2019-04-15 ENCOUNTER — Telehealth: Payer: Self-pay

## 2019-04-15 NOTE — Telephone Encounter (Signed)
Patient called triage and said that he needs new rx for liners for his prosthesis [phone]

## 2019-04-15 NOTE — Telephone Encounter (Signed)
Called pt to advise that rx has been written and is at the desk for pick up. Advised that his last office visit was 04/2018 and that he may be required by insurance to come in for a more recent office visit to honor rx and if that is the case he can call back and let us know.

## 2019-06-16 ENCOUNTER — Telehealth: Payer: Self-pay | Admitting: Orthopedic Surgery

## 2019-06-16 NOTE — Telephone Encounter (Signed)
Patient called stating that the shells of his feet are bad and is needing a new RX for them and the rebuild kit for the pumps.  CB#[phone].  Thank you.

## 2019-06-16 NOTE — Telephone Encounter (Signed)
Patient was called and lvm to get appt scheduled to come in so that we can take care of his needs. Patient was last seen in our office in 2019. Please make appt for patient if he returns call. Thank you

## 2019-06-19 ENCOUNTER — Encounter: Payer: Self-pay | Admitting: Orthopedic Surgery

## 2019-06-19 ENCOUNTER — Ambulatory Visit (INDEPENDENT_AMBULATORY_CARE_PROVIDER_SITE_OTHER): Payer: 59 | Admitting: Orthopedic Surgery

## 2019-06-19 VITALS — Ht 68.0 in | Wt 170.0 lb

## 2019-06-19 DIAGNOSIS — Z89511 Acquired absence of right leg below knee: Secondary | ICD-10-CM | POA: Diagnosis not present

## 2019-06-19 DIAGNOSIS — Z89512 Acquired absence of left leg below knee: Secondary | ICD-10-CM

## 2019-07-08 ENCOUNTER — Encounter: Payer: Self-pay | Admitting: Orthopedic Surgery

## 2019-07-08 NOTE — Progress Notes (Signed)
Office Visit Note   Patient: Ryan [name]           Date of Birth: 24-Oct-1960           MRN: [id number] Visit Date: 06/19/2019              Requested by: Quentin Angst, MD [address] [place],  [place] [address] PCP: Quentin Angst, MD  Chief Complaint  Patient presents with  . Right Leg - Follow-up    Bil BKA f/u  . 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  Assessment & Plan: Visit Diagnoses:  1. S/P bilateral BKA (below knee amputation) (HCC)     Plan: Patient is given a prescription for [facility] for new sockets to unload the pressure on the end of the residual limb patient has had significant volume loss.  Follow-Up Instructions: Return if symptoms worsen or fail to improve.   Ortho Exam  Patient is alert, oriented, no adenopathy, well-dressed, normal affect, normal respiratory effort. Examination patient has had significant decreased volume in the residual limbs bilaterally he is an bearing along the tibial crest that is quite prominent with skin breakdown.  Despite current modifications to his prosthetic socket patient is still placing too much weight on the end of the residual limb.  Imaging: No results found. No images are attached to the encounter.  Labs: Lab Results  Component Value Date   HGBA1C 6.5 01/17/2017   HGBA1C 6.2 05/25/2016   HGBA1C 6.0 01/14/2016   ESRSEDRATE 92 (H) 08/27/2014   ESRSEDRATE 81 (H) 06/08/2014   ESRSEDRATE 55 (H) 05/26/2014   CRP 20.0 (H) 06/08/2014   CRP 2.7 (H) 05/26/2014   CRP 7.1 (H) 03/24/2014   REPTSTATUS 09/03/2014 FINAL 08/27/2014   GRAMSTAIN Rare 05/07/2014   GRAMSTAIN WBC present-predominately Mononuclear 05/07/2014   GRAMSTAIN Rare Squamous Epithelial Cells Present 05/07/2014   GRAMSTAIN Few Gram Positive Cocci In Pairs In  Clusters 05/07/2014   GRAMSTAIN Rare Gram Negative Rods 05/07/2014   CULT  08/27/2014    NO GROWTH 5 DAYS Note: Culture results may be compromised due to an excessive volume of blood received in culture bottles. Performed at [facility]    LABORGA METHICILLIN RESISTANT STAPHYLOCOCCUS AUREUS 05/07/2014     Lab Results  Component Value Date   ALBUMIN 4.0 01/17/2017   ALBUMIN 2.9 (L) 08/28/2014   ALBUMIN 2.8 (L) 06/08/2014    No results found for: MG No results found for: VD25OH  No results found for: PREALBUMIN CBC EXTENDED Latest Ref Rng & Units 03/03/2016 02/25/2016 02/25/2015  WBC 10:3/mL 8.5 10.2 8.4  RBC 4.22 - 5.81 MIL/uL - 3.87(L) 4.47  HGB 13.5 - 17.5 g/dL 12.8(A) 11.9(L) 13.4  HCT 41 - 53 % 42 35.6(L) 39.8  PLT 150 - 399 K/L 274 212 218  NEUTROABS 1.7 - 7.7 K/uL - - 5.1  LYMPHSABS 0.7 - 4.0 K/uL - - 2.3     Body mass index is 25.85 kg/m.  Orders:  No orders of the defined types were placed in this encounter.  No orders of the defined types were placed in this encounter.    Procedures: No procedures performed  Clinical Data: No additional findings.  ROS:  All other systems negative, except as noted in the HPI. Review of Systems  Objective:  Vital Signs: Ht 5\' 8"  (1.727 m)   Wt 170 lb (77.1 kg)   BMI 25.85 kg/m   Specialty Comments:  No specialty comments available.  PMFS History: Patient Active Problem List   Diagnosis Date Noted  . Needs flu shot 05/25/2016  . Healthcare maintenance 05/25/2016  . S/P bilateral BKA (below knee amputation) ([place]) 02/25/2016  . Diabetic ulcer of right foot associated with type 2 diabetes mellitus ([place]) 01/16/2016  . H/O osteomyelitis 01/14/2016  . Type 2 diabetes mellitus treated without insulin ([place]) 10/14/2015  . Dyslipidemia (high LDL; low HDL) 10/14/2015  . Essential hypertension 06/23/2014  . Diabetic foot ([place]) 05/07/2014   Past Medical History:  Diagnosis Date  . Anemia    low iron  .  Arthritis   . Diabetes mellitus without complication ([place])    type 2  . Diabetic neuropathy ([place])   . Diabetic neuropathy ([place])   . Headache    migraines as a child  . Heart murmur    was told "not to worry about it"  . Hypertension   . Osteomyelitis of foot ([place])   . Peripheral vascular disease ([place])    "poor circulation" in feet and necrosis    Family History  Family history unknown: Yes    Past Surgical History:  Procedure Laterality Date  . AMPUTATION Right 01/13/2014   Procedure: Right great toe amputation;  Surgeon: Tobi Bastos, MD;  [facility];  Service: Orthopedics;  Laterality: Right;  . AMPUTATION Left 06/08/2014   Procedure: AMPUTATION LEFT GREAT TOE;  Surgeon: Augustin Schooling, MD;  [facility];  Service: Orthopedics;  Laterality: Left;  . AMPUTATION Left 07/01/2014   Procedure: LEFT First Metatarsal RAY AMPUTATION ;  Surgeon: Augustin Schooling, MD;  Location: [place];  Service: Orthopedics;  Laterality: Left;  . AMPUTATION Left 08/28/2014   Procedure: Left Foot Fifth ray resection;  Surgeon: Mcarthur Rossetti, MD;  [facility];  Service: Orthopedics;  Laterality: Left;  . AMPUTATION Left 10/08/2014   Procedure: LEFT FOOT TRANSMETATARSAL AMPUTATION;  Surgeon: Mcarthur Rossetti, MD;  Location: [place] [place];  Service: Orthopedics;  Laterality: Left;  . AMPUTATION Left 12/04/2014   Procedure: AMPUTATION BELOW KNEE;  Surgeon: Newt Minion, MD;  Location: [facility];  Service: Orthopedics;  Laterality: Left;  . AMPUTATION Right 02/25/2016   Procedure: RIGHT BELOW KNEE AMPUTATION;  Surgeon: Newt Minion, MD;  Location: [facility];  Service: Orthopedics;  Laterality: Right;  . arm surgery Left    due to broken arm  . KNEE SURGERY Right    Social History   Occupational History  . Not on file  Tobacco Use  . Smoking status: Never Smoker  . Smokeless tobacco: Current User    Types: Snuff  Substance and Sexual Activity  . Alcohol use: No    Alcohol/week: 0.0  standard drinks    Comment: occ   . Drug use: No  . Sexual activity: Not on file

## 2019-12-09 ENCOUNTER — Encounter: Payer: Self-pay | Admitting: Family

## 2019-12-09 ENCOUNTER — Other Ambulatory Visit: Payer: Self-pay

## 2019-12-09 ENCOUNTER — Ambulatory Visit (INDEPENDENT_AMBULATORY_CARE_PROVIDER_SITE_OTHER): Payer: 59 | Admitting: Family

## 2019-12-09 ENCOUNTER — Ambulatory Visit: Payer: 59 | Admitting: Family

## 2019-12-09 VITALS — Ht 68.0 in | Wt 170.0 lb

## 2019-12-09 DIAGNOSIS — L97811 Non-pressure chronic ulcer of other part of right lower leg limited to breakdown of skin: Secondary | ICD-10-CM

## 2019-12-09 NOTE — Progress Notes (Signed)
Office Visit Note   Patient: Ryan [name]           Date of Birth: 07/01/1961           MRN: [id number] Visit Date: 12/09/2019              Requested by: Quentin Angst, MD [address] [place],  [place] [address] PCP: Quentin Angst, MD  Chief Complaint  Patient presents with  . Right Knee - Pain    Hx right BKA 01/2016      HPI: The patient is a 59 year old gentleman who presents today complaining of ulceration to his right knee.  He is status post below-knee amputation.  States he has been having issues with the ulcer for up to 3 months now.  Feels it is possible that the liners have been rubbing.  He has had ulceration that has not improved his liner has cracks and splits overlying the area of ulceration.  States this scabs up overnight and then when he wears his prosthetic it breaks down during the day.  He is concerned that being on his prostheses during the day for work is worsening his ulcer.  He has been working with [facility] for his prosthesis needs.  His last liners were provided to him about 6 months ago back in November  Assessment & Plan: Visit Diagnoses: No diagnosis found.  Plan: Discussed he may benefit from having modifications made to his socket encouraged him to return to [facility].  Did provide an order for prosthesis supplies he may be a candidate for new liners.  He will continue with antibacterial ointment dressing changes daily.  Have provided a note for him to be out of work for a week at his request to get pressure off of this wound and allow it to heal  Follow-Up Instructions: Return in about 3 weeks (around 12/30/2019).   Ortho Exam  Patient is alert, oriented, no adenopathy, well-dressed, normal affect, normal respiratory effort. On examination of the right below-knee amputation the incision is well-healed well consolidated.  He does have a ulcer to the medial aspect of his knee this is just 3 mm in diameter with 1 mm of depth  this is filled in with 100% fibrinous tissue.  There is no surrounding erythema no odor no drainage  Imaging: No results found. No images are attached to the encounter.  Labs: Lab Results  Component Value Date   HGBA1C 6.5 01/17/2017   HGBA1C 6.2 05/25/2016   HGBA1C 6.0 01/14/2016   ESRSEDRATE 92 (H) 08/27/2014   ESRSEDRATE 81 (H) 06/08/2014   ESRSEDRATE 55 (H) 05/26/2014   CRP 20.0 (H) 06/08/2014   CRP 2.7 (H) 05/26/2014   CRP 7.1 (H) 03/24/2014   REPTSTATUS 09/03/2014 FINAL 08/27/2014   GRAMSTAIN Rare 05/07/2014   GRAMSTAIN WBC present-predominately Mononuclear 05/07/2014   GRAMSTAIN Rare Squamous Epithelial Cells Present 05/07/2014   GRAMSTAIN Few Gram Positive Cocci In Pairs In Clusters 05/07/2014   GRAMSTAIN Rare Gram Negative Rods 05/07/2014   CULT  08/27/2014    NO GROWTH 5 DAYS Note: Culture results may be compromised due to an excessive volume of blood received in culture bottles. Performed at [facility]    LABORGA METHICILLIN RESISTANT STAPHYLOCOCCUS AUREUS 05/07/2014     Lab Results  Component Value Date   ALBUMIN 4.0 01/17/2017   ALBUMIN 2.9 (L) 08/28/2014   ALBUMIN 2.8 (L) 06/08/2014    No results found for: MG No results found for:  VD25OH  No results found for: PREALBUMIN CBC EXTENDED Latest Ref Rng & Units 03/03/2016 02/25/2016 02/25/2015  WBC 10:3/mL 8.5 10.2 8.4  RBC 4.22 - 5.81 MIL/uL - 3.87(L) 4.47  HGB 13.5 - 17.5 g/dL 12.8(A) 11.9(L) 13.4  HCT 41 - 53 % 42 35.6(L) 39.8  PLT 150 - 399 K/L 274 212 218  NEUTROABS 1.7 - 7.7 K/uL - - 5.1  LYMPHSABS 0.7 - 4.0 K/uL - - 2.3     Body mass index is 25.85 kg/m.  Orders:  No orders of the defined types were placed in this encounter.  No orders of the defined types were placed in this encounter.    Procedures: No procedures performed  Clinical Data: No additional findings.  ROS:  All other systems negative, except as noted in the HPI. Review of Systems  Objective: Vital Signs:  Ht 5\' 8"  (1.727 m)   Wt 170 lb (77.1 kg)   BMI 25.85 kg/m   Specialty Comments:  No specialty comments available.  PMFS History: Patient Active Problem List   Diagnosis Date Noted  . Needs flu shot 05/25/2016  . Healthcare maintenance 05/25/2016  . S/P bilateral BKA (below knee amputation) ([place]) 02/25/2016  . Diabetic ulcer of right foot associated with type 2 diabetes mellitus ([place]) 01/16/2016  . H/O osteomyelitis 01/14/2016  . Type 2 diabetes mellitus treated without insulin ([place]) 10/14/2015  . Dyslipidemia (high LDL; low HDL) 10/14/2015  . Essential hypertension 06/23/2014  . Diabetic foot ([place]) 05/07/2014   Past Medical History:  Diagnosis Date  . Anemia    low iron  . Arthritis   . Diabetes mellitus without complication ([place])    type 2  . Diabetic neuropathy ([place])   . Diabetic neuropathy ([place])   . Headache    migraines as a child  . Heart murmur    was told "not to worry about it"  . Hypertension   . Osteomyelitis of foot ([place])   . Peripheral vascular disease ([place])    "poor circulation" in feet and necrosis    Family History  Family history unknown: Yes    Past Surgical History:  Procedure Laterality Date  . AMPUTATION Right 01/13/2014   Procedure: Right great toe amputation;  Surgeon: Tobi Bastos, MD;  [facility];  Service: Orthopedics;  Laterality: Right;  . AMPUTATION Left 06/08/2014   Procedure: AMPUTATION LEFT GREAT TOE;  Surgeon: Augustin Schooling, MD;  [facility];  Service: Orthopedics;  Laterality: Left;  . AMPUTATION Left 07/01/2014   Procedure: LEFT First Metatarsal RAY AMPUTATION ;  Surgeon: Augustin Schooling, MD;  Location: [facility];  Service: Orthopedics;  Laterality: Left;  . AMPUTATION Left 08/28/2014   Procedure: Left Foot Fifth ray resection;  Surgeon: Mcarthur Rossetti, MD;  [facility];  Service: Orthopedics;  Laterality: Left;  . AMPUTATION Left 10/08/2014   Procedure: LEFT FOOT TRANSMETATARSAL AMPUTATION;  Surgeon:  Mcarthur Rossetti, MD;  Location: [facility];  Service: Orthopedics;  Laterality: Left;  . AMPUTATION Left 12/04/2014   Procedure: AMPUTATION BELOW KNEE;  Surgeon: Newt Minion, MD;  Location: [facility];  Service: Orthopedics;  Laterality: Left;  . AMPUTATION Right 02/25/2016   Procedure: RIGHT BELOW KNEE AMPUTATION;  Surgeon: Newt Minion, MD;  Location: [facility];  Service: Orthopedics;  Laterality: Right;  . arm surgery Left    due to broken arm  . KNEE SURGERY Right    Social History   Occupational History  . Not on file  Tobacco Use  . Smoking status: Never Smoker  . Smokeless tobacco: Current User    Types: Snuff  Substance and Sexual Activity  . Alcohol use: No    Alcohol/week: 0.0 standard drinks    Comment: occ   . Drug use: No  . Sexual activity: Not on file

## 2019-12-25 ENCOUNTER — Telehealth: Payer: Self-pay | Admitting: Orthopedic Surgery

## 2019-12-25 NOTE — Telephone Encounter (Signed)
Signed handicap parking application at front desk for patient to pick up.

## 2019-12-30 ENCOUNTER — Encounter: Payer: Self-pay | Admitting: Family

## 2019-12-30 ENCOUNTER — Other Ambulatory Visit: Payer: Self-pay

## 2019-12-30 ENCOUNTER — Ambulatory Visit (INDEPENDENT_AMBULATORY_CARE_PROVIDER_SITE_OTHER): Payer: 59 | Admitting: Family

## 2019-12-30 VITALS — Ht 68.0 in | Wt 170.0 lb

## 2019-12-30 DIAGNOSIS — Z89512 Acquired absence of left leg below knee: Secondary | ICD-10-CM

## 2019-12-30 DIAGNOSIS — Z89511 Acquired absence of right leg below knee: Secondary | ICD-10-CM

## 2019-12-30 DIAGNOSIS — L97811 Non-pressure chronic ulcer of other part of right lower leg limited to breakdown of skin: Secondary | ICD-10-CM | POA: Diagnosis not present

## 2019-12-31 ENCOUNTER — Encounter: Payer: Self-pay | Admitting: Family

## 2019-12-31 NOTE — Progress Notes (Addendum)
Office Visit Note   Patient: Ryan [name]           Date of Birth: Jul 13, 1961           MRN: [id number] Visit Date: 12/30/2019              Requested by: Quentin Angst, MD [address] [place],  [place] [address] PCP: Quentin Angst, MD  Chief Complaint  Patient presents with  . Right Leg - Follow-up    Hx 2017 BKA       HPI: The patient is a 59 year old gentleman who presents today in follow-up for ulceration to the medial right knee.  He is status post below the knee amputation unfortunately he has been having issues where he feels the silicone liner is pinching. The liner is worn out and there are cracks to the inside of his silcone liner. he is getting pressure, pinching and rubbing.  He is working with [facility] for modifications and prosthesis needs.  He has been doing cleansing of his wound daily and using a Band-Aid pad.  Pleased with the mild improvement of his ulcer.  Assessment & Plan: Visit Diagnoses:  1. Skin ulcer of right knee, limited to breakdown of skin (HCC)   2. S/P bilateral BKA (below knee amputation) (HCC)     Plan: He will continue with his daily wound care.  No changes.  He will follow-up in about 6 weeks.  Anticipate once he is able to use a new liner the ulcer will heal uneventfully.  Follow-Up Instructions: Return in about 6 weeks (around 02/10/2020).   Ortho Exam  Patient is alert, oriented, no adenopathy, well-dressed, normal affect, normal respiratory effort. On examination of the right below-knee amputation the incision is well-healed well consolidated.  He does have a ulcer to the medial aspect of his knee this is just 3 mm in diameter with 1 mm of depth this is filled in with 50% fibrinous tissue.  There is no surrounding erythema no odor no drainage  Imaging: No results found. No images are attached to the encounter.  Labs: Lab Results  Component Value Date   HGBA1C 6.5 01/17/2017   HGBA1C 6.2 05/25/2016    HGBA1C 6.0 01/14/2016   ESRSEDRATE 92 (H) 08/27/2014   ESRSEDRATE 81 (H) 06/08/2014   ESRSEDRATE 55 (H) 05/26/2014   CRP 20.0 (H) 06/08/2014   CRP 2.7 (H) 05/26/2014   CRP 7.1 (H) 03/24/2014   REPTSTATUS 09/03/2014 FINAL 08/27/2014   GRAMSTAIN Rare 05/07/2014   GRAMSTAIN WBC present-predominately Mononuclear 05/07/2014   GRAMSTAIN Rare Squamous Epithelial Cells Present 05/07/2014   GRAMSTAIN Few Gram Positive Cocci In Pairs In Clusters 05/07/2014   GRAMSTAIN Rare Gram Negative Rods 05/07/2014   CULT  08/27/2014    NO GROWTH 5 DAYS Note: Culture results may be compromised due to an excessive volume of blood received in culture bottles. Performed at [facility]    LABORGA METHICILLIN RESISTANT STAPHYLOCOCCUS AUREUS 05/07/2014     Lab Results  Component Value Date   ALBUMIN 4.0 01/17/2017   ALBUMIN 2.9 (L) 08/28/2014   ALBUMIN 2.8 (L) 06/08/2014    No results found for: MG No results found for: VD25OH  No results found for: PREALBUMIN CBC EXTENDED Latest Ref Rng & Units 03/03/2016 02/25/2016 02/25/2015  WBC 10:3/mL 8.5 10.2 8.4  RBC 4.22 - 5.81 MIL/uL - 3.87(L) 4.47  HGB 13.5 - 17.5 g/dL 12.8(A) 11.9(L) 13.4  HCT 41 - 53 % 42  35.6(L) 39.8  PLT 150 - 399 K/L 274 212 218  NEUTROABS 1.7 - 7.7 K/uL - - 5.1  LYMPHSABS 0.7 - 4.0 K/uL - - 2.3     Body mass index is 25.85 kg/m.  Orders:  No orders of the defined types were placed in this encounter.  No orders of the defined types were placed in this encounter.    Procedures: No procedures performed  Clinical Data: No additional findings.  ROS:  All other systems negative, except as noted in the HPI. Review of Systems  Constitutional: Negative for chills and fever.  Cardiovascular: Negative for leg swelling.  Skin: Positive for wound. Negative for color change.    Objective: Vital Signs: Ht 5\' 8"  (1.727 m)   Wt 170 lb (77.1 kg)   BMI 25.85 kg/m   Specialty Comments:  No specialty comments  available.  PMFS History: Patient Active Problem List   Diagnosis Date Noted  . Needs flu shot 05/25/2016  . Healthcare maintenance 05/25/2016  . S/P bilateral BKA (below knee amputation) ([place]) 02/25/2016  . Diabetic ulcer of right foot associated with type 2 diabetes mellitus ([place]) 01/16/2016  . H/O osteomyelitis 01/14/2016  . Type 2 diabetes mellitus treated without insulin ([place]) 10/14/2015  . Dyslipidemia (high LDL; low HDL) 10/14/2015  . Essential hypertension 06/23/2014  . Diabetic foot ([place]) 05/07/2014   Past Medical History:  Diagnosis Date  . Anemia    low iron  . Arthritis   . Diabetes mellitus without complication ([place])    type 2  . Diabetic neuropathy ([place])   . Diabetic neuropathy ([place])   . Headache    migraines as a child  . Heart murmur    was told "not to worry about it"  . Hypertension   . Osteomyelitis of foot ([place])   . Peripheral vascular disease ([place])    "poor circulation" in feet and necrosis    Family History  Family history unknown: Yes    Past Surgical History:  Procedure Laterality Date  . AMPUTATION Right 01/13/2014   Procedure: Right great toe amputation;  Surgeon: Tobi Bastos, MD;  [facility];  Service: Orthopedics;  Laterality: Right;  . AMPUTATION Left 06/08/2014   Procedure: AMPUTATION LEFT GREAT TOE;  Surgeon: Augustin Schooling, MD;  [facility];  Service: Orthopedics;  Laterality: Left;  . AMPUTATION Left 07/01/2014   Procedure: LEFT First Metatarsal RAY AMPUTATION ;  Surgeon: Augustin Schooling, MD;  Location: [facility];  Service: Orthopedics;  Laterality: Left;  . AMPUTATION Left 08/28/2014   Procedure: Left Foot Fifth ray resection;  Surgeon: Mcarthur Rossetti, MD;  [facility];  Service: Orthopedics;  Laterality: Left;  . AMPUTATION Left 10/08/2014   Procedure: LEFT FOOT TRANSMETATARSAL AMPUTATION;  Surgeon: Mcarthur Rossetti, MD;  Location: [facility];  Service: Orthopedics;  Laterality: Left;  . AMPUTATION Left 12/04/2014     Procedure: AMPUTATION BELOW KNEE;  Surgeon: Newt Minion, MD;  Location: [facility];  Service: Orthopedics;  Laterality: Left;  . AMPUTATION Right 02/25/2016   Procedure: RIGHT BELOW KNEE AMPUTATION;  Surgeon: Newt Minion, MD;  Location: [facility];  Service: Orthopedics;  Laterality: Right;  . arm surgery Left    due to broken arm  . KNEE SURGERY Right    Social History   Occupational History  . Not on file  Tobacco Use  . Smoking status: Never Smoker  . Smokeless tobacco: Current User    Types: Snuff  Substance and  Sexual Activity  . Alcohol use: No    Alcohol/week: 0.0 standard drinks    Comment: occ   . Drug use: No  . Sexual activity: Not on file

## 2020-01-05 ENCOUNTER — Telehealth: Payer: Self-pay | Admitting: Orthopedic Surgery

## 2020-01-05 ENCOUNTER — Encounter: Payer: Self-pay | Admitting: Orthopedic Surgery

## 2020-01-05 NOTE — Telephone Encounter (Signed)
Patient called.   He is requesting a note taking him out of work until he gets the issue with his prosthetic squared away with [employer]. [place] Spaniel they are unable to write this note, must be from Dr.Duda.    Call back: [phone]

## 2020-01-05 NOTE — Telephone Encounter (Signed)
Patient was called and lvm informing that work note is ready for pickup.

## 2020-01-13 ENCOUNTER — Telehealth: Payer: Self-pay

## 2020-01-13 NOTE — Telephone Encounter (Signed)
[facility] needs an adjustment to the pts office note from 12/09/19 the note supports the right side liner replacement and socket modification but doesn't indicate for the need of bilateral RP socket replacements.

## 2020-02-10 ENCOUNTER — Encounter: Payer: Self-pay | Admitting: Physician Assistant

## 2020-02-10 ENCOUNTER — Ambulatory Visit (INDEPENDENT_AMBULATORY_CARE_PROVIDER_SITE_OTHER): Payer: 59 | Admitting: Physician Assistant

## 2020-02-10 ENCOUNTER — Other Ambulatory Visit: Payer: Self-pay

## 2020-02-10 VITALS — Ht 68.0 in | Wt 170.0 lb

## 2020-02-10 DIAGNOSIS — Z89512 Acquired absence of left leg below knee: Secondary | ICD-10-CM | POA: Diagnosis not present

## 2020-02-10 DIAGNOSIS — Z89511 Acquired absence of right leg below knee: Secondary | ICD-10-CM | POA: Diagnosis not present

## 2020-02-10 NOTE — Progress Notes (Signed)
Office Visit Note   Patient: Ryan [name]           Date of Birth: 11-01-1960           MRN: [id number] Visit Date: 02/10/2020              Requested by: Quentin Angst, MD [address] [place],  [place] [address] PCP: Quentin Angst, MD  Chief Complaint  Patient presents with  . Right Leg - Follow-up    Hx 2017 BKA       HPI: This is a pleasant gentleman who is status post bilateral BKA's.  He is extremely active and does boating and running races.  He is in the process of having a new socket made for the right side as the previous socket caused some pressure injuries to the medial side of his knee.  This is gotten much smaller but there still is a small spot.  There has been delaying making his final prosthetic.  He is concerned about going back to work and being on his feet for several hours with a prosthetic that has not been completed  Assessment & Plan: Visit Diagnoses: No diagnosis found.  Plan: Provided the patient to be out of work until next week when he is able to receive his final prosthetic follow-up as needed  Follow-Up Instructions: No follow-ups on file.   Ortho Exam  Patient is alert, oriented, no adenopathy, well-dressed, normal affect, normal respiratory effort. Right knee: Resolved ulcer on the medial side of the knee no drainage no fluctuance no cellulitis there is just a very small pinpoint eschar.  Imaging: No results found. No images are attached to the encounter.  Labs: Lab Results  Component Value Date   HGBA1C 6.5 01/17/2017   HGBA1C 6.2 05/25/2016   HGBA1C 6.0 01/14/2016   ESRSEDRATE 92 (H) 08/27/2014   ESRSEDRATE 81 (H) 06/08/2014   ESRSEDRATE 55 (H) 05/26/2014   CRP 20.0 (H) 06/08/2014   CRP 2.7 (H) 05/26/2014   CRP 7.1 (H) 03/24/2014   REPTSTATUS 09/03/2014 FINAL 08/27/2014   GRAMSTAIN Rare 05/07/2014   GRAMSTAIN WBC present-predominately Mononuclear 05/07/2014   GRAMSTAIN Rare Squamous Epithelial Cells Present  05/07/2014   GRAMSTAIN Few Gram Positive Cocci In Pairs In Clusters 05/07/2014   GRAMSTAIN Rare Gram Negative Rods 05/07/2014   CULT  08/27/2014    NO GROWTH 5 DAYS Note: Culture results may be compromised due to an excessive volume of blood received in culture bottles. Performed at [facility]    LABORGA METHICILLIN RESISTANT STAPHYLOCOCCUS AUREUS 05/07/2014     Lab Results  Component Value Date   ALBUMIN 4.0 01/17/2017   ALBUMIN 2.9 (L) 08/28/2014   ALBUMIN 2.8 (L) 06/08/2014    No results found for: MG No results found for: VD25OH  No results found for: PREALBUMIN CBC EXTENDED Latest Ref Rng & Units 03/03/2016 02/25/2016 02/25/2015  WBC 10:3/mL 8.5 10.2 8.4  RBC 4.22 - 5.81 MIL/uL - 3.87(L) 4.47  HGB 13.5 - 17.5 g/dL 12.8(A) 11.9(L) 13.4  HCT 41 - 53 % 42 35.6(L) 39.8  PLT 150 - 399 K/L 274 212 218  NEUTROABS 1.7 - 7.7 K/uL - - 5.1  LYMPHSABS 0.7 - 4.0 K/uL - - 2.3     Body mass index is 25.85 kg/m.  Orders:  No orders of the defined types were placed in this encounter.  No orders of the defined types were placed in this encounter.    Procedures: No procedures  performed  Clinical Data: No additional findings.  ROS:  All other systems negative, except as noted in the HPI. Review of Systems  Objective: Vital Signs: Ht 5\' 8"  (1.727 m)   Wt 170 lb (77.1 kg)   BMI 25.85 kg/m   Specialty Comments:  No specialty comments available.  PMFS History: Patient Active Problem List   Diagnosis Date Noted  . Needs flu shot 05/25/2016  . Healthcare maintenance 05/25/2016  . S/P bilateral BKA (below knee amputation) (HCC) 02/25/2016  . 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 H/O osteomyelitis 01/14/2016  . Type 2 diabetes mellitus treated without insulin (HCC) 10/14/2015  . Dyslipidemia (high LDL; low HDL) 10/14/2015  . Essential hypertension 06/23/2014  . Diabetic foot (HCC) 05/07/2014   Past Medical  History:  Diagnosis Date  . Anemia    low iron  . Arthritis   . Diabetes mellitus without complication (HCC)    type 2  . Diabetic neuropathy (HCC)   . Diabetic neuropathy (HCC)   . Headache    migraines as a child  . Heart murmur    was told "not to worry about it"  . Hypertension   . Osteomyelitis of foot (HCC)   . Peripheral vascular disease (HCC)    "poor circulation" in feet and necrosis    Family History  Family history unknown: Yes    Past Surgical History:  Procedure Laterality Date  . AMPUTATION Right 01/13/2014   Procedure: Right great toe amputation;  Surgeon: 01/15/2014, MD;  Location: [facility];  Service: Orthopedics;  Laterality: Right;  . AMPUTATION Left 06/08/2014   Procedure: AMPUTATION LEFT GREAT TOE;  Surgeon: 13/03/2014, MD;  Location: [facility];  Service: Orthopedics;  Laterality: Left;  . AMPUTATION Left 07/01/2014   Procedure: LEFT First Metatarsal RAY AMPUTATION ;  Surgeon: 14/08/2013, MD;  Location: [facility] OR;  Service: Orthopedics;  Laterality: Left;  . AMPUTATION Left 08/28/2014   Procedure: Left Foot Fifth ray resection;  Surgeon: 08/30/2014, MD;  Location: [facility];  Service: Orthopedics;  Laterality: Left;  . AMPUTATION Left 10/08/2014   Procedure: LEFT FOOT TRANSMETATARSAL AMPUTATION;  Surgeon: 12/08/2014, MD;  Location: [facility] OR;  Service: Orthopedics;  Laterality: Left;  . AMPUTATION Left 12/04/2014   Procedure: AMPUTATION BELOW KNEE;  Surgeon: 02/03/2015, MD;  Location: [facility] OR;  Service: Orthopedics;  Laterality: Left;  . AMPUTATION Right 02/25/2016   Procedure: RIGHT BELOW KNEE AMPUTATION;  Surgeon: 02/27/2016, MD;  Location: [facility] OR;  Service: Orthopedics;  Laterality: Right;  . arm surgery Left    due to broken arm  . KNEE SURGERY Right    Social History   Occupational History  . Not on file  Tobacco Use  . Smoking status: Never Smoker  . Smokeless tobacco: Current User    Types: Snuff  Substance and Sexual  Activity  . Alcohol use: No    Alcohol/week: 0.0 standard drinks    Comment: occ   . Drug use: No  . Sexual activity: Not on file

## 2020-03-19 ENCOUNTER — Telehealth: Payer: Self-pay | Admitting: Family

## 2020-03-19 ENCOUNTER — Telehealth: Payer: Self-pay

## 2020-03-19 ENCOUNTER — Other Ambulatory Visit: Payer: Self-pay | Admitting: Physician Assistant

## 2020-03-19 ENCOUNTER — Encounter: Payer: Self-pay | Admitting: Family

## 2020-03-19 ENCOUNTER — Ambulatory Visit (INDEPENDENT_AMBULATORY_CARE_PROVIDER_SITE_OTHER): Payer: 59 | Admitting: Family

## 2020-03-19 VITALS — Ht 68.0 in | Wt 170.0 lb

## 2020-03-19 DIAGNOSIS — Z89512 Acquired absence of left leg below knee: Secondary | ICD-10-CM

## 2020-03-19 DIAGNOSIS — L02416 Cutaneous abscess of left lower limb: Secondary | ICD-10-CM

## 2020-03-19 DIAGNOSIS — Z89511 Acquired absence of right leg below knee: Secondary | ICD-10-CM | POA: Diagnosis not present

## 2020-03-19 MED ORDER — DOXYCYCLINE HYCLATE 100 MG PO TABS: 100.0000 mg | ORAL_TABLET | Freq: Two times a day (BID) | ORAL | 0 refills | Status: DC

## 2020-03-19 MED ORDER — DOXYCYCLINE HYCLATE 100 MG PO TABS
100.0000 mg | ORAL_TABLET | Freq: Two times a day (BID) | ORAL | 0 refills | Status: DC
Start: 2020-03-19 — End: 2024-01-02

## 2020-03-19 NOTE — Telephone Encounter (Signed)
Patient called advised the antibiotic was not sent to [facility]. Patient uses the [facility] on [address] and [place]. The number to contact patient is [phone]

## 2020-03-19 NOTE — Telephone Encounter (Signed)
Complete

## 2020-03-19 NOTE — Telephone Encounter (Signed)
Can you please resubmit rx from [facility] this morning?

## 2020-03-19 NOTE — Telephone Encounter (Signed)
Called pt to advise that this has been sent to that pharm.

## 2020-03-19 NOTE — Telephone Encounter (Signed)
Patient states [facility] has not received the medication fax for refill. Please resend or call [facility] on fill for refill. Patient phone number is [phone].

## 2020-03-19 NOTE — Telephone Encounter (Signed)
Pt states that rx is still not ready pharm did not receive it. Called the pharm and lm on vm to advise rx for doxy to call with questions. Pt is aware.

## 2020-03-19 NOTE — Telephone Encounter (Signed)
Rx sent 

## 2020-03-23 ENCOUNTER — Encounter: Payer: Self-pay | Admitting: Family

## 2020-03-23 NOTE — Progress Notes (Signed)
Office Visit Note   Patient: Ryan [name]           Date of Birth: Jul 25, 1961           MRN: [id number] Visit Date: 03/19/2020              Requested by: Quentin Angst, MD [address] [place],  [place] [address] PCP: Quentin Angst, MD  Chief Complaint  Patient presents with  . Right Leg - Pain    01/2016 BKA       HPI: The patient is a 59 year old gentleman seen today for evaluation of a new painful red knot to the left fibular head.  He reports this is been ongoing for about 5 days this is been associated with pain he did have some chills and then last night reports a fever as high as 101 today he is fever free.  The knot is quite painful in his prosthesis he has had difficulty wearing his prosthesis he did report to the medical assistant he had shaved down the inside of his socket and modified his own socket  Assessment & Plan: Visit Diagnoses:  1. Abscess of skin of left knee   2. S/P bilateral BKA (below knee amputation) (HCC)     Plan: I&D of the abscess of left knee.  Patient tolerated well.  Free draining of purulent fluid.  We will place him on doxycycline.  He will follow-up next week discussed strict return precautions over the weekend.  Follow-Up Instructions: Return in about 1 week (around 03/26/2020).   Ortho Exam  Patient is alert, oriented, no adenopathy, well-dressed, normal affect, normal respiratory effort. On examination of his left residual limb he has erythema and swelling to the fibular head.  There is palpable abscess this is not open there is no drainage.  There is warmth.  There is no ascending cellulitis.  Imaging: No results found. No images are attached to the encounter.  Labs: Lab Results  Component Value Date   HGBA1C 6.5 01/17/2017   HGBA1C 6.2 05/25/2016   HGBA1C 6.0 01/14/2016   ESRSEDRATE 92 (H) 08/27/2014   ESRSEDRATE 81 (H) 06/08/2014   ESRSEDRATE 55 (H) 05/26/2014   CRP 20.0 (H) 06/08/2014   CRP 2.7 (H)  05/26/2014   CRP 7.1 (H) 03/24/2014   REPTSTATUS 09/03/2014 FINAL 08/27/2014   GRAMSTAIN Rare 05/07/2014   GRAMSTAIN WBC present-predominately Mononuclear 05/07/2014   GRAMSTAIN Rare Squamous Epithelial Cells Present 05/07/2014   GRAMSTAIN Few Gram Positive Cocci In Pairs In Clusters 05/07/2014   GRAMSTAIN Rare Gram Negative Rods 05/07/2014   CULT  08/27/2014    NO GROWTH 5 DAYS Note: Culture results may be compromised due to an excessive volume of blood received in culture bottles. Performed at [facility]    LABORGA METHICILLIN RESISTANT STAPHYLOCOCCUS AUREUS 05/07/2014     Lab Results  Component Value Date   ALBUMIN 4.0 01/17/2017   ALBUMIN 2.9 (L) 08/28/2014   ALBUMIN 2.8 (L) 06/08/2014    No results found for: MG No results found for: VD25OH  No results found for: PREALBUMIN CBC EXTENDED Latest Ref Rng & Units 03/03/2016 02/25/2016 02/25/2015  WBC 10:3/mL 8.5 10.2 8.4  RBC 4.22 - 5.81 MIL/uL - 3.87(L) 4.47  HGB 13.5 - 17.5 g/dL 12.8(A) 11.9(L) 13.4  HCT 41 - 53 % 42 35.6(L) 39.8  PLT 150 - 399 K/L 274 212 218  NEUTROABS 1.7 - 7.7 K/uL - - 5.1  LYMPHSABS 0.7 - 4.0 K/uL - -  2.3     Body mass index is 25.85 kg/m.  Orders:  No orders of the defined types were placed in this encounter.  Meds ordered this encounter  Medications  . DISCONTD: doxycycline (VIBRA-TABS) 100 MG tablet    Sig: Take 1 tablet (100 mg total) by mouth 2 (two) times daily.    Dispense:  28 tablet    Refill:  0     Procedures: No procedures performed  Clinical Data: No additional findings.  ROS:  All other systems negative, except as noted in the HPI. Review of Systems  Constitutional: Negative for chills and fever.  Cardiovascular: Negative for leg swelling.  Skin: Positive for color change and wound.    Objective: Vital Signs: Ht 5\' 8"  (1.727 m)   Wt 170 lb (77.1 kg)   BMI 25.85 kg/m   Specialty Comments:  No specialty comments available.  PMFS History: Patient  Active Problem List   Diagnosis Date Noted  . Needs flu shot 05/25/2016  . Healthcare maintenance 05/25/2016  . S/P bilateral BKA (below knee amputation) (HCC) 02/25/2016  . 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 H/O osteomyelitis 01/14/2016  . Type 2 diabetes mellitus treated without insulin (HCC) 10/14/2015  . Dyslipidemia (high LDL; low HDL) 10/14/2015  . Essential hypertension 06/23/2014  . Diabetic foot (HCC) 05/07/2014   Past Medical History:  Diagnosis Date  . Anemia    low iron  . Arthritis   . Diabetes mellitus without complication (HCC)    type 2  . Diabetic neuropathy (HCC)   . Diabetic neuropathy (HCC)   . Headache    migraines as a child  . Heart murmur    was told "not to worry about it"  . Hypertension   . Osteomyelitis of foot (HCC)   . Peripheral vascular disease (HCC)    "poor circulation" in feet and necrosis    Family History  Family history unknown: Yes    Past Surgical History:  Procedure Laterality Date  . AMPUTATION Right 01/13/2014   Procedure: Right great toe amputation;  Surgeon: 01/15/2014, MD;  Location: [facility];  Service: Orthopedics;  Laterality: Right;  . AMPUTATION Left 06/08/2014   Procedure: AMPUTATION LEFT GREAT TOE;  Surgeon: 13/03/2014, MD;  Location: [facility];  Service: Orthopedics;  Laterality: Left;  . AMPUTATION Left 07/01/2014   Procedure: LEFT First Metatarsal RAY AMPUTATION ;  Surgeon: 14/08/2013, MD;  Location: [facility] OR;  Service: Orthopedics;  Laterality: Left;  . AMPUTATION Left 08/28/2014   Procedure: Left Foot Fifth ray resection;  Surgeon: 08/30/2014, MD;  Location: [facility];  Service: Orthopedics;  Laterality: Left;  . AMPUTATION Left 10/08/2014   Procedure: LEFT FOOT TRANSMETATARSAL AMPUTATION;  Surgeon: 12/08/2014, MD;  Location: [facility] OR;  Service: Orthopedics;  Laterality: Left;  . AMPUTATION Left 12/04/2014   Procedure: AMPUTATION BELOW KNEE;   Surgeon: 02/03/2015, MD;  Location: [facility] OR;  Service: Orthopedics;  Laterality: Left;  . AMPUTATION Right 02/25/2016   Procedure: RIGHT BELOW KNEE AMPUTATION;  Surgeon: 02/27/2016, MD;  Location: [facility] OR;  Service: Orthopedics;  Laterality: Right;  . arm surgery Left    due to broken arm  . KNEE SURGERY Right    Social History   Occupational History  . Not on file  Tobacco Use  . Smoking status: Never Smoker  . Smokeless tobacco: Current User    Types: Snuff  Substance and Sexual Activity  . Alcohol use: No    Alcohol/week: 0.0 standard drinks    Comment: occ   . Drug use: No  . Sexual activity: Not on file

## 2020-03-26 ENCOUNTER — Ambulatory Visit (INDEPENDENT_AMBULATORY_CARE_PROVIDER_SITE_OTHER): Payer: 59 | Admitting: Family

## 2020-03-26 ENCOUNTER — Encounter: Payer: Self-pay | Admitting: Family

## 2020-03-26 VITALS — Ht 68.0 in | Wt 170.0 lb

## 2020-03-26 DIAGNOSIS — L02416 Cutaneous abscess of left lower limb: Secondary | ICD-10-CM

## 2020-03-31 ENCOUNTER — Encounter: Payer: Self-pay | Admitting: Family

## 2020-03-31 NOTE — Progress Notes (Signed)
Office Visit Note   Patient: Ryan [name]           Date of Birth: 1961-01-18           MRN: [id number] Visit Date: 03/26/2020              Requested by: Quentin Angst, MD [address] [place],  [place] [address] PCP: Quentin Angst, MD  Chief Complaint  Patient presents with   Left Leg - Follow-up    Hx BKA f/u abscess left knee       HPI: The patient is a 59 year old gentleman seen today in follow-up for abscess. Was open and draining at last visit. Placed on  Doxycycline. Has made remarkable improvement. States is healed. No concerns today.   In last week with new painful red knot to the left fibular head.  He reports this is been ongoing for about 5 days this is been associated with pain he did have some chills and then last night reports a fever as high as 101 today he is fever free.  The knot is quite painful in his prosthesis he has had difficulty wearing his prosthesis he did report to the medical assistant he had shaved down the inside of his socket and modified his own socket  Assessment & Plan: Visit Diagnoses:  No diagnosis found.  Plan: will closely monitor. Complete oral abx course. Follow up as needed.   Follow-Up Instructions: Return if symptoms worsen or fail to improve.   Ortho Exam  Patient is alert, oriented, no adenopathy, well-dressed, normal affect, normal respiratory effort. On examination of his left residual limb he resolving cellulitis over fibular head on left. No open wound. no warmth. No drainage or sign of infection.  Imaging: No results found. No images are attached to the encounter.  Labs: Lab Results  Component Value Date   HGBA1C 6.5 01/17/2017   HGBA1C 6.2 05/25/2016   HGBA1C 6.0 01/14/2016   ESRSEDRATE 92 (H) 08/27/2014   ESRSEDRATE 81 (H) 06/08/2014   ESRSEDRATE 55 (H) 05/26/2014   CRP 20.0 (H) 06/08/2014   CRP 2.7 (H) 05/26/2014   CRP 7.1 (H) 03/24/2014   REPTSTATUS 09/03/2014 FINAL 08/27/2014    GRAMSTAIN Rare 05/07/2014   GRAMSTAIN WBC present-predominately Mononuclear 05/07/2014   GRAMSTAIN Rare Squamous Epithelial Cells Present 05/07/2014   GRAMSTAIN Few Gram Positive Cocci In Pairs In Clusters 05/07/2014   GRAMSTAIN Rare Gram Negative Rods 05/07/2014   CULT  08/27/2014    NO GROWTH 5 DAYS Note: Culture results may be compromised due to an excessive volume of blood received in culture bottles. Performed at [facility]    LABORGA METHICILLIN RESISTANT STAPHYLOCOCCUS AUREUS 05/07/2014     Lab Results  Component Value Date   ALBUMIN 4.0 01/17/2017   ALBUMIN 2.9 (L) 08/28/2014   ALBUMIN 2.8 (L) 06/08/2014    No results found for: MG No results found for: VD25OH  No results found for: PREALBUMIN CBC EXTENDED Latest Ref Rng & Units 03/03/2016 02/25/2016 02/25/2015  WBC 10:3/mL 8.5 10.2 8.4  RBC 4.22 - 5.81 MIL/uL - 3.87(L) 4.47  HGB 13.5 - 17.5 g/dL 12.8(A) 11.9(L) 13.4  HCT 41 - 53 % 42 35.6(L) 39.8  PLT 150 - 399 K/L 274 212 218  NEUTROABS 1.7 - 7.7 K/uL - - 5.1  LYMPHSABS 0.7 - 4.0 K/uL - - 2.3     Body mass index is 25.85 kg/m.  Orders:  No orders of the defined types  were placed in this encounter.  No orders of the defined types were placed in this encounter.    Procedures: No procedures performed  Clinical Data: No additional findings.  ROS:  All other systems negative, except as noted in the HPI. Review of Systems  Constitutional: Negative for chills and fever.  Cardiovascular: Negative for leg swelling.  Skin: Positive for color change and wound.    Objective: Vital Signs: Ht 5\' 8"  (1.727 m)    Wt 170 lb (77.1 kg)    BMI 25.85 kg/m   Specialty Comments:  No specialty comments available.  PMFS History: Patient Active Problem List   Diagnosis Date Noted   Needs flu shot 05/25/2016   Healthcare maintenance 05/25/2016   S/P bilateral BKA (below knee amputation) (HCC) 02/25/2016   Diabetic ulcer of right foot associated with  type 2 diabetes mellitus (HCC) 01/16/2016   H/O osteomyelitis 01/14/2016   Type 2 diabetes mellitus treated without insulin (HCC) 10/14/2015   Dyslipidemia (high LDL; low HDL) 10/14/2015   Essential hypertension 06/23/2014   Diabetic foot (HCC) 05/07/2014   Past Medical History:  Diagnosis Date   Anemia    low iron   Arthritis    Diabetes mellitus without complication (HCC)    type 2   Diabetic neuropathy (HCC)    Diabetic neuropathy (HCC)    Headache    migraines as a child   Heart murmur    was told "not to worry about it"   Hypertension    Osteomyelitis of foot (HCC)    Peripheral vascular disease (HCC)    "poor circulation" in feet and necrosis    Family History  Family history unknown: Yes    Past Surgical History:  Procedure Laterality Date   AMPUTATION Right 01/13/2014   Procedure: Right great toe amputation;  Surgeon: 01/15/2014, MD;  Location: [facility];  Service: Orthopedics;  Laterality: Right;   AMPUTATION Left 06/08/2014   Procedure: AMPUTATION LEFT GREAT TOE;  Surgeon: 13/03/2014, MD;  Location: [facility];  Service: Orthopedics;  Laterality: Left;   AMPUTATION Left 07/01/2014   Procedure: LEFT First Metatarsal RAY AMPUTATION ;  Surgeon: 14/08/2013, MD;  Location[facility] OR;  Service: Orthopedics;  Laterality: Left;   AMPUTATION Left 08/28/2014   Procedure: Left Foot Fifth ray resection;  Surgeon: 08/30/2014, MD;  Location: [facility];  Service: Orthopedics;  Laterality: Left;   AMPUTATION Left 10/08/2014   Procedure: LEFT FOOT TRANSMETATARSAL AMPUTATION;  Surgeon: 12/08/2014, MD;  Location: [facility] OR;  Service: Orthopedics;  Laterality: Left;   AMPUTATION Left 12/04/2014   Procedure: AMPUTATION BELOW KNEE;  Surgeon: 02/03/2015, MD;  Location: [facility] OR;  Service: Orthopedics;  Laterality: Left;   AMPUTATION Right 02/25/2016   Procedure: RIGHT BELOW KNEE AMPUTATION;  Surgeon: 02/27/2016, MD;  Location: [facility] OR;  Service:  Orthopedics;  Laterality: Right;   arm surgery Left    due to broken arm   KNEE SURGERY Right    Social History   Occupational History   Not on file  Tobacco Use   Smoking status: Never Smoker   Smokeless tobacco: Current User    Types: Snuff  Substance and Sexual Activity   Alcohol use: No    Alcohol/week: 0.0 standard drinks    Comment: occ    Drug use: No   Sexual activity: Not on file

## 2020-11-05 ENCOUNTER — Ambulatory Visit (INDEPENDENT_AMBULATORY_CARE_PROVIDER_SITE_OTHER): Payer: 59 | Admitting: Family

## 2020-11-05 ENCOUNTER — Encounter: Payer: Self-pay | Admitting: Family

## 2020-11-05 DIAGNOSIS — L02416 Cutaneous abscess of left lower limb: Secondary | ICD-10-CM | POA: Diagnosis not present

## 2020-11-05 DIAGNOSIS — Z89511 Acquired absence of right leg below knee: Secondary | ICD-10-CM

## 2020-11-05 DIAGNOSIS — Z89512 Acquired absence of left leg below knee: Secondary | ICD-10-CM | POA: Diagnosis not present

## 2020-11-05 MED ORDER — SULFAMETHOXAZOLE-TRIMETHOPRIM 800-160 MG PO TABS: 1.0000 | ORAL_TABLET | Freq: Two times a day (BID) | ORAL | 0 refills | Status: DC

## 2020-11-05 NOTE — Progress Notes (Signed)
Office Visit Note   Patient: Ryan [name]           Date of Birth: [birth year]           MRN: [id number] Visit Date: 11/05/2020              Requested by: Quentin Angst, MD [address] [place],  [place] [address] PCP: Quentin Angst, MD  Chief Complaint  Patient presents with  . Left Leg - Follow-up      HPI: The patient is a 60 year old gentleman who presents today for evaluation of his bilateral residual limbs.  He feels his liners are broken down and are causing skin injuries.  He is unsure whether his sockets are ill fitting as well he is having a loose fit and pinching from cracks in his liners  He also reports he has been having abscesses and wounds in his left popliteal fossa he feels this is coming he is able to occasionally express purulence.  He has not had any systemic symptoms.  This is a recurring problem for him.  Relates he has more skin issues in the summer feels this may also be worsened by sweating. he has begun mowing his lawn  Assessment & Plan: Visit Diagnoses:  1. S/P bilateral BKA (below knee amputation) (HCC)   2. Abscess of skin of left knee     Plan: No ascending cellulitis today.  We will place him on oral antibiotics.  Plan to reevaluate in 1 week.  Did provide an order for new liners as well as prosthesis supplies.  Bilateral prosthetics  Follow-Up Instructions: No follow-ups on file.   Ortho Exam  Patient is alert, oriented, no adenopathy, well-dressed, normal affect, normal respiratory effort. On evaluation of left residual limb does have 5 pinpoint abscesses to popliteal fossa. Mild surrounding erythema and tenderness. No palpable deep abscess. No ascending cellulitis or warmth. B residual limbs overall well healed and well consolidated.  Imaging: No results found. No images are attached to the encounter.  Labs: Lab Results  Component Value Date   HGBA1C 6.5 01/17/2017   HGBA1C 6.2 05/25/2016   HGBA1C 6.0  01/14/2016   ESRSEDRATE 92 (H) 08/27/2014   ESRSEDRATE 81 (H) 06/08/2014   ESRSEDRATE 55 (H) 05/26/2014   CRP 20.0 (H) 06/08/2014   CRP 2.7 (H) 05/26/2014   CRP 7.1 (H) 03/24/2014   REPTSTATUS 09/03/2014 FINAL 08/27/2014   GRAMSTAIN Rare 05/07/2014   GRAMSTAIN WBC present-predominately Mononuclear 05/07/2014   GRAMSTAIN Rare Squamous Epithelial Cells Present 05/07/2014   GRAMSTAIN Few Gram Positive Cocci In Pairs In Clusters 05/07/2014   GRAMSTAIN Rare Gram Negative Rods 05/07/2014   CULT  08/27/2014    NO GROWTH 5 DAYS Note: Culture results may be compromised due to an excessive volume of blood received in culture bottles. Performed at [facility]    LABORGA METHICILLIN RESISTANT STAPHYLOCOCCUS AUREUS 05/07/2014     Lab Results  Component Value Date   ALBUMIN 4.0 01/17/2017   ALBUMIN 2.9 (L) 08/28/2014   ALBUMIN 2.8 (L) 06/08/2014    No results found for: MG No results found for: VD25OH  No results found for: PREALBUMIN CBC EXTENDED Latest Ref Rng & Units 03/03/2016 02/25/2016 02/25/2015  WBC 10:3/mL 8.5 10.2 8.4  RBC 4.22 - 5.81 MIL/uL - 3.87(L) 4.47  HGB 13.5 - 17.5 g/dL 12.8(A) 11.9(L) 13.4  HCT 41 - 53 % 42 35.6(L) 39.8  PLT 150 - 399 K/L 274 212 218  NEUTROABS 1.7 - 7.7 K/uL - - 5.1  LYMPHSABS 0.7 - 4.0 K/uL - - 2.3     There is no height or weight on file to calculate BMI.  Orders:  No orders of the defined types were placed in this encounter.  Meds ordered this encounter  Medications  . sulfamethoxazole-trimethoprim (BACTRIM DS) 800-160 MG tablet    Sig: Take 1 tablet by mouth 2 (two) times daily.    Dispense:  20 tablet    Refill:  0     Procedures: No procedures performed  Clinical Data: No additional findings.  ROS:  All other systems negative, except as noted in the HPI. Review of Systems  Constitutional: Negative for chills and fever.  Skin: Positive for color change, rash and wound.    Objective: Vital Signs: There were no  vitals taken for this visit.  Specialty Comments:  No specialty comments available.  PMFS History: Patient Active Problem List   Diagnosis Date Noted  . Needs flu shot 05/25/2016  . Healthcare maintenance 05/25/2016  . S/P bilateral BKA (below knee amputation) (HCC) 02/25/2016  . 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 H/O osteomyelitis 01/14/2016  . Type 2 diabetes mellitus treated without insulin (HCC) 10/14/2015  . Dyslipidemia (high LDL; low HDL) 10/14/2015  . Essential hypertension 06/23/2014  . Diabetic foot (HCC) 05/07/2014   Past Medical History:  Diagnosis Date  . Anemia    low iron  . Arthritis   . Diabetes mellitus without complication (HCC)    type 2  . Diabetic neuropathy (HCC)   . Diabetic neuropathy (HCC)   . Headache    migraines as a child  . Heart murmur    was told "not to worry about it"  . Hypertension   . Osteomyelitis of foot (HCC)   . Peripheral vascular disease (HCC)    "poor circulation" in feet and necrosis    Family History  Family history unknown: Yes    Past Surgical History:  Procedure Laterality Date  . AMPUTATION Right 01/13/2014   Procedure: Right great toe amputation;  Surgeon: Jacki Cones, MD;  Location: [facility];  Service: Orthopedics;  Laterality: Right;  . AMPUTATION Left 06/08/2014   Procedure: AMPUTATION LEFT GREAT TOE;  Surgeon: Verlee Rossetti, MD;  Location: [facility];  Service: Orthopedics;  Laterality: Left;  . AMPUTATION Left 07/01/2014   Procedure: LEFT First Metatarsal RAY AMPUTATION ;  Surgeon: Verlee Rossetti, MD;  Location: [facility] OR;  Service: Orthopedics;  Laterality: Left;  . AMPUTATION Left 08/28/2014   Procedure: Left Foot Fifth ray resection;  Surgeon: Kathryne Hitch, MD;  Location: [facility];  Service: Orthopedics;  Laterality: Left;  . AMPUTATION Left 10/08/2014   Procedure: LEFT FOOT TRANSMETATARSAL AMPUTATION;  Surgeon: Kathryne Hitch, MD;  Location: [facility]  OR;  Service: Orthopedics;  Laterality: Left;  . AMPUTATION Left 12/04/2014   Procedure: AMPUTATION BELOW KNEE;  Surgeon: Nadara Mustard, MD;  Location: [facility] OR;  Service: Orthopedics;  Laterality: Left;  . AMPUTATION Right 02/25/2016   Procedure: RIGHT BELOW KNEE AMPUTATION;  Surgeon: Nadara Mustard, MD;  Location: [facility] OR;  Service: Orthopedics;  Laterality: Right;  . arm surgery Left    due to broken arm  . KNEE SURGERY Right    Social History   Occupational History  . Not on file  Tobacco Use  . Smoking status: Never Smoker  . Smokeless tobacco: Current User  Types: Snuff  Substance and Sexual Activity  . Alcohol use: No    Alcohol/week: 0.0 standard drinks    Comment: occ   . Drug use: No  . Sexual activity: Not on file

## 2021-01-04 ENCOUNTER — Other Ambulatory Visit: Payer: Self-pay

## 2021-01-04 ENCOUNTER — Ambulatory Visit (INDEPENDENT_AMBULATORY_CARE_PROVIDER_SITE_OTHER): Payer: 59 | Admitting: Physician Assistant

## 2021-01-04 ENCOUNTER — Encounter: Payer: Self-pay | Admitting: Orthopedic Surgery

## 2021-01-04 DIAGNOSIS — Z89512 Acquired absence of left leg below knee: Secondary | ICD-10-CM | POA: Diagnosis not present

## 2021-01-04 DIAGNOSIS — Z89511 Acquired absence of right leg below knee: Secondary | ICD-10-CM

## 2021-01-04 NOTE — Progress Notes (Signed)
Office Visit Note   Patient: Ryan [name]           Date of Birth: [birth year]           MRN: [id number] Visit Date: 01/04/2021              Requested by: Quentin Angst, MD [address] [place],  [place] [address] PCP: Quentin Angst, MD  Chief Complaint  Patient presents with  . Right Leg - Follow-up  . Left Leg - Follow-up      HPI: Patient presents today with a chief complaint of ill fitting prosthetics.  He is status post bilateral below-knee amputations and has not had his sockets updated in about a year.  He has lost significant volume and the socket is no longer fit.  He feels unstable.  He is also concerned that despite modifications he is getting rubbing on the back of his knee.  Assessment & Plan: Visit Diagnoses: No diagnosis found.  Plan: Prescription was provided for bilateral below-knee amputation prosthetics and supplies.  May follow-up as needed  Follow-Up Instructions: No follow-ups on file.   Ortho Exam  Patient is alert, oriented, no adenopathy, well-dressed, normal affect, normal respiratory effort. Bilateral amputation stumps no open sores no breakdown no concerns for infection or cellulitis.  He does have pressure areas on the posterior aspect of the left leg and anteriorly on both legs.  Significant disparity between the sockets and the amputation stumps Patient is an existing bilateral transtibial  amputee.  Patient's current comorbidities are not expected to impact the ability to function with the prescribed prosthesis. Patient verbally communicates a strong desire to use a prosthesis. Patient currently requires mobility aids to ambulate without a prosthesis.  Expects not to use mobility aids with a new prosthesis.  Patient is a K3 level ambulator that spends a lot of time walking around on uneven terrain over obstacles, up and down stairs, and ambulates with a variable cadence.    Imaging: No results found. No images are  attached to the encounter.  Labs: Lab Results  Component Value Date   HGBA1C 6.5 01/17/2017   HGBA1C 6.2 05/25/2016   HGBA1C 6.0 01/14/2016   ESRSEDRATE 92 (H) 08/27/2014   ESRSEDRATE 81 (H) 06/08/2014   ESRSEDRATE 55 (H) 05/26/2014   CRP 20.0 (H) 06/08/2014   CRP 2.7 (H) 05/26/2014   CRP 7.1 (H) 03/24/2014   REPTSTATUS 09/03/2014 FINAL 08/27/2014   GRAMSTAIN Rare 05/07/2014   GRAMSTAIN WBC present-predominately Mononuclear 05/07/2014   GRAMSTAIN Rare Squamous Epithelial Cells Present 05/07/2014   GRAMSTAIN Few Gram Positive Cocci In Pairs In Clusters 05/07/2014   GRAMSTAIN Rare Gram Negative Rods 05/07/2014   CULT  08/27/2014    NO GROWTH 5 DAYS Note: Culture results may be compromised due to an excessive volume of blood received in culture bottles. Performed at [facility]    LABORGA METHICILLIN RESISTANT STAPHYLOCOCCUS AUREUS 05/07/2014     Lab Results  Component Value Date   ALBUMIN 4.0 01/17/2017   ALBUMIN 2.9 (L) 08/28/2014   ALBUMIN 2.8 (L) 06/08/2014    No results found for: MG No results found for: VD25OH  No results found for: PREALBUMIN CBC EXTENDED Latest Ref Rng & Units 03/03/2016 02/25/2016 02/25/2015  WBC 10:3/mL 8.5 10.2 8.4  RBC 4.22 - 5.81 MIL/uL - 3.87(L) 4.47  HGB 13.5 - 17.5 g/dL 12.8(A) 11.9(L) 13.4  HCT 41 - 53 % 42 35.6(L) 39.8  PLT 150 - 399 K/L 274  212 218  NEUTROABS 1.7 - 7.7 K/uL - - 5.1  LYMPHSABS 0.7 - 4.0 K/uL - - 2.3     There is no height or weight on file to calculate BMI.  Orders:  No orders of the defined types were placed in this encounter.  No orders of the defined types were placed in this encounter.    Procedures: No procedures performed  Clinical Data: No additional findings.  ROS:  All other systems negative, except as noted in the HPI. Review of Systems  Objective: Vital Signs: There were no vitals taken for this visit.  Specialty Comments:  No specialty comments available.  PMFS  History: Patient Active Problem List   Diagnosis Date Noted  . Needs flu shot 05/25/2016  . Healthcare maintenance 05/25/2016  . S/P bilateral BKA (below knee amputation) (HCC) 02/25/2016  . 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 H/O osteomyelitis 01/14/2016  . Type 2 diabetes mellitus treated without insulin (HCC) 10/14/2015  . Dyslipidemia (high LDL; low HDL) 10/14/2015  . Essential hypertension 06/23/2014  . Diabetic foot (HCC) 05/07/2014   Past Medical History:  Diagnosis Date  . Anemia    low iron  . Arthritis   . Diabetes mellitus without complication (HCC)    type 2  . Diabetic neuropathy (HCC)   . Diabetic neuropathy (HCC)   . Headache    migraines as a child  . Heart murmur    was told "not to worry about it"  . Hypertension   . Osteomyelitis of foot (HCC)   . Peripheral vascular disease (HCC)    "poor circulation" in feet and necrosis    Family History  Family history unknown: Yes    Past Surgical History:  Procedure Laterality Date  . AMPUTATION Right 01/13/2014   Procedure: Right great toe amputation;  Surgeon: Jacki Cones, MD;  Location: [facility];  Service: Orthopedics;  Laterality: Right;  . AMPUTATION Left 06/08/2014   Procedure: AMPUTATION LEFT GREAT TOE;  Surgeon: Verlee Rossetti, MD;  Location: [facility];  Service: Orthopedics;  Laterality: Left;  . AMPUTATION Left 07/01/2014   Procedure: LEFT First Metatarsal RAY AMPUTATION ;  Surgeon: Verlee Rossetti, MD;  Location: [facility] OR;  Service: Orthopedics;  Laterality: Left;  . AMPUTATION Left 08/28/2014   Procedure: Left Foot Fifth ray resection;  Surgeon: Kathryne Hitch, MD;  Location: [facility];  Service: Orthopedics;  Laterality: Left;  . AMPUTATION Left 10/08/2014   Procedure: LEFT FOOT TRANSMETATARSAL AMPUTATION;  Surgeon: Kathryne Hitch, MD;  Location: [facility] OR;  Service: Orthopedics;  Laterality: Left;  . AMPUTATION Left 12/04/2014   Procedure:  AMPUTATION BELOW KNEE;  Surgeon: Nadara Mustard, MD;  Location: [facility] OR;  Service: Orthopedics;  Laterality: Left;  . AMPUTATION Right 02/25/2016   Procedure: RIGHT BELOW KNEE AMPUTATION;  Surgeon: Nadara Mustard, MD;  Location: [facility] OR;  Service: Orthopedics;  Laterality: Right;  . arm surgery Left    due to broken arm  . KNEE SURGERY Right    Social History   Occupational History  . Not on file  Tobacco Use  . Smoking status: Never Smoker  . Smokeless tobacco: Current User    Types: Snuff  Substance and Sexual Activity  . Alcohol use: No    Alcohol/week: 0.0 standard drinks    Comment: occ   . Drug use: No  . Sexual activity: Not on file

## 2021-09-24 LAB — COLOGUARD: COLOGUARD: NEGATIVE

## 2021-11-25 ENCOUNTER — Telehealth: Payer: Self-pay | Admitting: Orthopedic Surgery

## 2021-11-25 NOTE — Telephone Encounter (Signed)
Pt called and states that he has two holes in his prothesis and he needs to be looked at as soon as possible. He says he also has a lot of sweat build up. ? ?CB [phone] ?

## 2021-11-25 NOTE — Telephone Encounter (Signed)
Can you please call pt and make an appt for next available he will need an office visit for insurance to cover new rx but any problem he is having with his prosthetic he should call the prosthetic company.  ?

## 2021-11-29 ENCOUNTER — Ambulatory Visit (INDEPENDENT_AMBULATORY_CARE_PROVIDER_SITE_OTHER): Payer: 59 | Admitting: Family

## 2021-11-29 ENCOUNTER — Telehealth: Payer: Self-pay | Admitting: Orthopedic Surgery

## 2021-11-29 ENCOUNTER — Encounter: Payer: Self-pay | Admitting: Family

## 2021-11-29 DIAGNOSIS — Z89511 Acquired absence of right leg below knee: Secondary | ICD-10-CM | POA: Diagnosis not present

## 2021-11-29 DIAGNOSIS — Z89512 Acquired absence of left leg below knee: Secondary | ICD-10-CM

## 2021-11-29 DIAGNOSIS — L97811 Non-pressure chronic ulcer of other part of right lower leg limited to breakdown of skin: Secondary | ICD-10-CM

## 2021-11-29 NOTE — Telephone Encounter (Signed)
Received call from [facility] with [employer] needing notes and an order faxed over to her. Ryan [name] also asked for a demographic sheet as well. Ryan [name] said the patient has an appointment tomorrow. The fax# is [phone]   The phone # is [phone] ?

## 2021-11-29 NOTE — Progress Notes (Addendum)
Office Visit Note   Patient: Ryan [name]           Date of Birth: [birth year]           MRN: [id number] Visit Date: 11/29/2021              Requested by: Quentin AngstJegede, Olugbemiga E, MD [address] [place],  [place] [id number] PCP: Quentin AngstJegede, Olugbemiga E, MD  Chief Complaint  Patient presents with   Right Leg - Wound Check      HPI: The patient is a 61 year old gentleman who comes in today concerned for some wounds in the popliteal fossa on the right.  He states that he has had similar occurrences on the left.  Overall he strongly feels that he does not have a adequate fit of his current prosthetics.  He has new prostheses which she obtained in the fall 2022 bilateral below-knee amputations.  He states that these do not fit well they are quite painful they are too tight he is concerned for many issues with the sockets themselves which relate to fit pain rubbing areas of pressure due to this he has been wearing his old prostheses which were known to not fit well.  He has recently gone back to work he is on his feet many more hours than he was previously he is sweating in his process this is liners and has developed ulcers he is concerned that the sweat is also causing added pressure  He feels quite strongly that he would like to pursue a new prosthetist of a new company  Patient is an existing bilateral transtibial  amputee.  Patient's current comorbidities are not expected to impact the ability to function with the prescribed prosthesis. Patient verbally communicates a strong desire to use a prosthesis. Patient currently requires mobility aids to ambulate without a prosthesis.  Expects not to use mobility aids with a new prosthesis.  Patient is a K3 level ambulator that spends a lot of time walking around on uneven terrain over obstacles, up and down stairs, and ambulates with a variable cadence.     Assessment & Plan: Visit Diagnoses:  1. S/P bilateral BKA (below knee  amputation) (HCC)   2. Skin ulcer of right knee, limited to breakdown of skin [facility](HCC)     Plan: Order provided today for new prosthesis set up bilaterally.  Patient would benefit from new socket set up due to excessive volume loss of bilateral residual limbs.  With current ill fit in his socket he is at risk for skin breakdown, pain, loss of quality of life.  Socket needs to be replaced to restore fit and function of his prosthetic bilaterally.  Patient will take this to the company of his choice.  Overall no concern for infection of the small ulcers in his right popliteal fossa he will keep them clean and dry and follow-up in the office as needed  Follow-Up Instructions: Return if symptoms worsen or fail to improve.   Ortho Exam  Patient is alert, oriented, no adenopathy, well-dressed, normal affect, normal respiratory effort. On examination bilateral below-knee amputations these are well consolidated well-healed he does have two 2 mm in diameter ulcers to his right popliteal fossa these are filled in with fibrinous exudative tissue there is very mild very minimal surrounding erythema there is no warmth noted no concerning sign for infection no open ulcer to his left leg or residual limb  Imaging: No results found. No images are attached to the encounter.  Labs: Lab Results  Component Value Date   HGBA1C 6.5 01/17/2017   HGBA1C 6.2 05/25/2016   HGBA1C 6.0 01/14/2016   ESRSEDRATE 92 (H) 08/27/2014   ESRSEDRATE 81 (H) 06/08/2014   ESRSEDRATE 55 (H) 05/26/2014   CRP 20.0 (H) 06/08/2014   CRP 2.7 (H) 05/26/2014   CRP 7.1 (H) 03/24/2014   REPTSTATUS 09/03/2014 FINAL 08/27/2014   GRAMSTAIN Rare 05/07/2014   GRAMSTAIN WBC present-predominately Mononuclear 05/07/2014   GRAMSTAIN Rare Squamous Epithelial Cells Present 05/07/2014   GRAMSTAIN Few Gram Positive Cocci In Pairs In Clusters 05/07/2014   GRAMSTAIN Rare Gram Negative Rods 05/07/2014   CULT  08/27/2014    NO GROWTH 5 DAYS Note:  Culture results may be compromised due to an excessive volume of blood received in culture bottles. Performed at [facility] METHICILLIN RESISTANT STAPHYLOCOCCUS AUREUS 05/07/2014     Lab Results  Component Value Date   ALBUMIN 4.0 01/17/2017   ALBUMIN 2.9 (L) 08/28/2014   ALBUMIN 2.8 (L) 06/08/2014    No results found for: MG No results found for: VD25OH  No results found for: PREALBUMIN    Latest Ref Rng & Units 03/03/2016   12:00 AM 02/25/2016   11:58 AM 02/25/2015    5:21 PM  CBC EXTENDED  WBC 10^3/mL 8.5      10.2   8.4    RBC 4.22 - 5.81 MIL/uL  3.87   4.47    Hemoglobin 13.5 - 17.5 g/dL 09.8      11.9   14.7    HCT 41 - 53 % 42      35.6   39.8    Platelets 150 - 399 K/L 274      212   218    NEUT# 1.7 - 7.7 K/uL   5.1    Lymph# 0.7 - 4.0 K/uL   2.3       This result is from an external source.     There is no height or weight on file to calculate BMI.  Orders:  No orders of the defined types were placed in this encounter.  No orders of the defined types were placed in this encounter.    Procedures: No procedures performed  Clinical Data: No additional findings.  ROS:  All other systems negative, except as noted in the HPI. Review of Systems  Objective: Vital Signs: There were no vitals taken for this visit.  Specialty Comments:  No specialty comments available.  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 2 diabetes mellitus (HCC) 01/16/2016   H/O osteomyelitis 01/14/2016   Type 2 diabetes mellitus treated without insulin (HCC) 10/14/2015   Dyslipidemia (high LDL; low HDL) 10/14/2015   Essential hypertension 06/23/2014   Diabetic foot (HCC) 05/07/2014   Past Medical History:  Diagnosis Date   Anemia    low iron    Arthritis    Diabetes mellitus without complication (HCC)    type 2   Diabetic neuropathy (HCC)    Diabetic neuropathy (HCC)    Headache    migraines as a child   Heart murmur    was told "not to worry about it"   Hypertension    Osteomyelitis of foot (HCC)    Peripheral vascular disease (HCC)    "poor  circulation" in feet and necrosis    Family History  Family history unknown: Yes    Past Surgical History:  Procedure Laterality Date   AMPUTATION Right 01/13/2014   Procedure: Right great toe amputation;  Surgeon: Jacki Cones, MD;  Location: [facility];  Service: Orthopedics;  Laterality: Right;   AMPUTATION Left 06/08/2014   Procedure: AMPUTATION LEFT GREAT TOE;  Surgeon: Verlee Rossetti, MD;  Location: [facility];  Service: Orthopedics;  Laterality: Left;   AMPUTATION Left 07/01/2014   Procedure: LEFT First Metatarsal RAY AMPUTATION ;  Surgeon: Verlee Rossetti, MD;  Location[facility];  Service: Orthopedics;  Laterality: Left;   AMPUTATION Left 08/28/2014   Procedure: Left Foot Fifth ray resection;  Surgeon: Kathryne Hitch, MD;  Location: [facility];  Service: Orthopedics;  Laterality: Left;   AMPUTATION Left 10/08/2014   Procedure: LEFT FOOT TRANSMETATARSAL AMPUTATION;  Surgeon: Kathryne Hitch, MD;  Location: [facility] OR;  Service: Orthopedics;  Laterality: Left;   AMPUTATION Left 12/04/2014   Procedure: AMPUTATION BELOW KNEE;  Surgeon: Nadara Mustard, MD;  Location: [facility] OR;  Service: Orthopedics;  Laterality: Left;   AMPUTATION Right 02/25/2016   Procedure: RIGHT BELOW KNEE AMPUTATION;  Surgeon: Nadara Mustard, MD;  Location: [facility] OR;  Service: Orthopedics;  Laterality: Right;   arm surgery Left    due to broken arm   KNEE SURGERY Right    Social History   Occupational History   Not on file  Tobacco Use   Smoking status: Never   Smokeless tobacco: Current    Types: Snuff  Substance and Sexual Activity   Alcohol use: No    Alcohol/week: 0.0 standard drinks    Comment: occ     Drug use: No   Sexual activity: Not on file

## 2021-11-30 NOTE — Telephone Encounter (Signed)
OV notes and demo sheet faxed to [facility]. Patient was given a hand written order to provide and take with him to appt. ?

## 2021-12-13 ENCOUNTER — Telehealth: Payer: Self-pay | Admitting: Family

## 2021-12-13 NOTE — Telephone Encounter (Signed)
Ryan [name] has it in medical records and is faxing it right now. ?

## 2021-12-13 NOTE — Telephone Encounter (Signed)
Ryan [name] from [employer] called requesting a call back from [place] or [place] about medical notes and orders faxed over twice. Please call Ryan [name] at [phone]. ?

## 2021-12-15 ENCOUNTER — Telehealth: Payer: Self-pay | Admitting: Family

## 2021-12-15 NOTE — Telephone Encounter (Signed)
11/29/21 ov note faxed to [employer] [phone]

## 2021-12-20 NOTE — Telephone Encounter (Signed)
Byrd Hesselbach is faxing over a template that is a suggestion to use for Ryan [name] to write the addendum. The way the notes are written is not compliant to file insurance. Without the proper documentation she cannot get approval for the supplies Ryan [name]. Please advise

## 2022-02-02 ENCOUNTER — Ambulatory Visit (INDEPENDENT_AMBULATORY_CARE_PROVIDER_SITE_OTHER): Payer: 59 | Admitting: Orthopedic Surgery

## 2022-02-02 DIAGNOSIS — Z89511 Acquired absence of right leg below knee: Secondary | ICD-10-CM

## 2022-02-02 DIAGNOSIS — Z89512 Acquired absence of left leg below knee: Secondary | ICD-10-CM

## 2022-02-02 MED ORDER — IBUPROFEN 800 MG PO TABS: 800.0000 mg | ORAL_TABLET | Freq: Three times a day (TID) | ORAL | 0 refills | Status: AC | PRN

## 2022-02-21 ENCOUNTER — Encounter: Payer: Self-pay | Admitting: Orthopedic Surgery

## 2022-02-21 NOTE — Progress Notes (Signed)
Office Visit Note   Patient: Ryan [name]           Date of Birth: 1960/11/21           MRN: [id number] Visit Date: 02/02/2022              Requested by: Quentin Angst, MD [address] [place],  [place] [address] PCP: Quentin Angst, MD  Chief Complaint  Patient presents with   Right Leg - Wound Check      HPI: Patient is a 61 year old gentleman who presents in follow-up complaining of blisters of the left below-knee amputation.  Assessment & Plan: Visit Diagnoses:  1. S/P bilateral BKA (below knee amputation) (HCC)     Plan: Patient was given a prescription for modifications to his socket to unload the pressure areas.  A prescription was written for ibuprofen.  Follow-Up Instructions: Return in about 4 weeks (around 03/02/2022).   Ortho Exam  Patient is alert, oriented, no adenopathy, well-dressed, normal affect, normal respiratory effort. Examination patient has pressure ulcers over the lateral aspect of the left transtibial amputation residual limb.  There is some small open wounds there is no cellulitis no exposed bone or tendon.  Imaging: No results found. No images are attached to the encounter.  Labs: Lab Results  Component Value Date   HGBA1C 6.5 01/17/2017   HGBA1C 6.2 05/25/2016   HGBA1C 6.0 01/14/2016   ESRSEDRATE 92 (H) 08/27/2014   ESRSEDRATE 81 (H) 06/08/2014   ESRSEDRATE 55 (H) 05/26/2014   CRP 20.0 (H) 06/08/2014   CRP 2.7 (H) 05/26/2014   CRP 7.1 (H) 03/24/2014   REPTSTATUS 09/03/2014 FINAL 08/27/2014   GRAMSTAIN Rare 05/07/2014   GRAMSTAIN WBC present-predominately Mononuclear 05/07/2014   GRAMSTAIN Rare Squamous Epithelial Cells Present 05/07/2014   GRAMSTAIN Few Gram Positive Cocci In Pairs In Clusters 05/07/2014   GRAMSTAIN Rare Gram Negative Rods 05/07/2014   CULT  08/27/2014    NO GROWTH 5 DAYS Note: Culture results may be compromised due to an excessive volume of blood received in culture bottles. Performed at  [facility] METHICILLIN RESISTANT STAPHYLOCOCCUS AUREUS 05/07/2014     Lab Results  Component Value Date   ALBUMIN 4.0 01/17/2017   ALBUMIN 2.9 (L) 08/28/2014   ALBUMIN 2.8 (L) 06/08/2014    No results found for: "MG" No results found for: "VD25OH"  No results found for: "PREALBUMIN"    Latest Ref Rng & Units 03/03/2016   12:00 AM 02/25/2016   11:58 AM 02/25/2015    5:21 PM  CBC EXTENDED  WBC 10^3/mL 8.5     10.2  8.4   RBC 4.22 - 5.81 MIL/uL  3.87  4.47   Hemoglobin 13.5 - 17.5 g/dL 02.5     85.2  77.8   HCT 41 - 53 % 42     35.6  39.8   Platelets 150 - 399 K/L 274     212  218   NEUT# 1.7 - 7.7 K/uL   5.1   Lymph# 0.7 - 4.0 K/uL   2.3      This result is from an external source.     There is no height or weight on file to calculate BMI.  Orders:  No orders of the defined types were placed in this encounter.  Meds ordered this encounter  Medications   ibuprofen (ADVIL) 800 MG tablet    Sig: Take 1 tablet (800 mg total) by mouth every 8 (  eight) hours as needed.    Dispense:  30 tablet    Refill:  0     Procedures: No procedures performed  Clinical Data: No additional findings.  ROS:  All other systems negative, except as noted in the HPI. Review of Systems  Objective: Vital Signs: There were no vitals taken for this visit.  Specialty Comments:  No specialty comments available.  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 2 diabetes mellitus (HCC) 01/16/2016   H/O osteomyelitis 01/14/2016   Type 2 diabetes mellitus treated without insulin (HCC) 10/14/2015   Dyslipidemia (high LDL; low HDL) 10/14/2015   Essential hypertension 06/23/2014   Diabetic foot (HCC) 05/07/2014   Past Medical History:  Diagnosis Date   Anemia    low  iron   Arthritis    Diabetes mellitus without complication (HCC)    type 2   Diabetic neuropathy (HCC)    Diabetic neuropathy (HCC)    Headache    migraines as a child   Heart murmur    was told "not to worry about it"   Hypertension    Osteomyelitis of foot (HCC)    Peripheral vascular disease (HCC)    "poor circulation" in feet and necrosis    Family History  Family history unknown: Yes    Past Surgical History:  Procedure Laterality Date   AMPUTATION Right 01/13/2014   Procedure: Right great toe amputation;  Surgeon: Jacki Cones, MD;  Location: [facility];  Service: Orthopedics;  Laterality: Right;   AMPUTATION Left 06/08/2014   Procedure: AMPUTATION LEFT GREAT TOE;  Surgeon: Verlee Rossetti, MD;  Location: [facility];  Service: Orthopedics;  Laterality: Left;   AMPUTATION Left 07/01/2014   Procedure: LEFT First Metatarsal RAY AMPUTATION ;  Surgeon: Verlee Rossetti, MD;  Location: [facility] OR;  Service: Orthopedics;  Laterality: Left;   AMPUTATION Left 08/28/2014   Procedure: Left Foot Fifth ray resection;  Surgeon: Kathryne Hitch, MD;  Location: [facility];  Service: Orthopedics;  Laterality: Left;   AMPUTATION Left 10/08/2014   Procedure: LEFT FOOT TRANSMETATARSAL AMPUTATION;  Surgeon: Kathryne Hitch, MD;  Location: [facility] OR;  Service: Orthopedics;  Laterality: Left;   AMPUTATION Left 12/04/2014   Procedure: AMPUTATION BELOW KNEE;  Surgeon: Nadara Mustard, MD;  Location: [facility] OR;  Service: Orthopedics;  Laterality: Left;   AMPUTATION Right 02/25/2016   Procedure: RIGHT BELOW KNEE AMPUTATION;  Surgeon: Nadara Mustard, MD;  Location: [facility] OR;  Service: Orthopedics;  Laterality: Right;   arm surgery Left    due to broken arm   KNEE SURGERY Right    Social History   Occupational History   Not on file  Tobacco Use   Smoking status: Never   Smokeless tobacco: Current    Types: Snuff  Substance and Sexual Activity   Alcohol use: No    Alcohol/week: 0.0 standard drinks of alcohol     Comment: occ    Drug use: No   Sexual activity: Not on file

## 2022-03-15 ENCOUNTER — Ambulatory Visit (INDEPENDENT_AMBULATORY_CARE_PROVIDER_SITE_OTHER): Payer: 59 | Admitting: Family

## 2022-03-15 ENCOUNTER — Encounter: Payer: Self-pay | Admitting: Family

## 2022-03-15 DIAGNOSIS — Z89512 Acquired absence of left leg below knee: Secondary | ICD-10-CM

## 2022-03-15 DIAGNOSIS — L97811 Non-pressure chronic ulcer of other part of right lower leg limited to breakdown of skin: Secondary | ICD-10-CM

## 2022-03-15 DIAGNOSIS — Z89511 Acquired absence of right leg below knee: Secondary | ICD-10-CM

## 2022-03-15 NOTE — Progress Notes (Signed)
Office Visit Note   Patient: Ryan [name]           Date of Birth: May 11, 1961           MRN: [id number] Visit Date: 03/15/2022              Requested by: Quentin Angst, MD [address] [place],  [place] [address] PCP: Quentin Angst, MD  Chief Complaint  Patient presents with   Right Leg - Wound Check   Left Leg - Wound Check      HPI: The patient is a 61 year old gentleman who presents today in follow-up he continues to have ulcers in his popliteal fossa and right from subsiding into his socket.  He has 2 sets of prostheses neither are well fitting.  1 set to be a 1 set too tight he continues to work full-time has issues from ulcers on the right worse than left from ill fit  He has been working with restore.  He today is requesting new socket set up bilaterally  Patient is an existing bilateral transtibial  amputee.  Patient's current comorbidities are not expected to impact the ability to function with the prescribed prosthesis. Patient verbally communicates a strong desire to use a prosthesis. Patient currently requires mobility aids to ambulate without a prosthesis.  Expects not to use mobility aids with a new prosthesis.  Patient is a K3 level ambulator that spends a lot of time walking around on uneven terrain over obstacles, up and down stairs, and ambulates with a variable cadence.     Assessment & Plan: Visit Diagnoses:  1. S/P bilateral BKA (below knee amputation) (HCC)   2. Skin ulcer of right knee, limited to breakdown of skin (HCC)     Plan: He will continue with close monitoring of his ulcers.  Discussed minimizing weightbearing daily Dial soap cleansing of the wounds monitoring for signs of infection.  Given an order for bilateral below-knee amputation prosthesis fabrication  Follow-Up Instructions: No follow-ups on file.   Ortho Exam  Patient is alert, oriented, no adenopathy, well-dressed, normal affect, normal respiratory  effort. On examination of the left residual limb his below-knee amputation is well consolidated well-healed.  To the right residual limb there are a few areas with erythema over bony prominences from pressure in his prosthesis he does have a 15 mm in diameter superficial ulcer in his popliteal fossa there is no surrounding erythema or drainage  Imaging: No results found. No images are attached to the encounter.  Labs: Lab Results  Component Value Date   HGBA1C 6.5 01/17/2017   HGBA1C 6.2 05/25/2016   HGBA1C 6.0 01/14/2016   ESRSEDRATE 92 (H) 08/27/2014   ESRSEDRATE 81 (H) 06/08/2014   ESRSEDRATE 55 (H) 05/26/2014   CRP 20.0 (H) 06/08/2014   CRP 2.7 (H) 05/26/2014   CRP 7.1 (H) 03/24/2014   REPTSTATUS 09/03/2014 FINAL 08/27/2014   GRAMSTAIN Rare 05/07/2014   GRAMSTAIN WBC present-predominately Mononuclear 05/07/2014   GRAMSTAIN Rare Squamous Epithelial Cells Present 05/07/2014   GRAMSTAIN Few Gram Positive Cocci In Pairs In Clusters 05/07/2014   GRAMSTAIN Rare Gram Negative Rods 05/07/2014   CULT  08/27/2014    NO GROWTH 5 DAYS Note: Culture results may be compromised due to an excessive volume of blood received in culture bottles. Performed at [facility] METHICILLIN RESISTANT STAPHYLOCOCCUS AUREUS 05/07/2014     Lab Results  Component Value Date   ALBUMIN 4.0 01/17/2017   ALBUMIN  2.9 (L) 08/28/2014   ALBUMIN 2.8 (L) 06/08/2014    No results found for: "MG" No results found for: "VD25OH"  No results found for: "PREALBUMIN"    Latest Ref Rng & Units 03/03/2016   12:00 AM 02/25/2016   11:58 AM 02/25/2015    5:21 PM  CBC EXTENDED  WBC 10^3/mL 8.5     10.2  8.4   RBC 4.22 - 5.81 MIL/uL  3.87  4.47   Hemoglobin 13.5 - 17.5 g/dL 17.5     10.2  58.5   HCT 41 - 53 % 42     35.6  39.8   Platelets 150 - 399 K/L 274     212  218   NEUT# 1.7 - 7.7 K/uL   5.1   Lymph# 0.7 - 4.0 K/uL   2.3      This result is from an external source.     There is no  height or weight on file to calculate BMI.  Orders:  No orders of the defined types were placed in this encounter.  No orders of the defined types were placed in this encounter.    Procedures: No procedures performed  Clinical Data: No additional findings.  ROS:  All other systems negative, except as noted in the HPI. Review of Systems  Objective: Vital Signs: There were no vitals taken for this visit.  Specialty Comments:  No specialty comments available.  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 2 diabetes mellitus (HCC) 01/16/2016   H/O osteomyelitis 01/14/2016   Type 2 diabetes mellitus treated without insulin (HCC) 10/14/2015   Dyslipidemia (high LDL; low HDL) 10/14/2015   Essential hypertension 06/23/2014   Diabetic foot (HCC) 05/07/2014   Past Medical History:  Diagnosis Date   Anemia    low iron   Arthritis    Diabetes mellitus without complication (HCC)    type 2   Diabetic neuropathy (HCC)    Diabetic neuropathy (HCC)    Headache    migraines as a child   Heart murmur    was told "not to worry about it"   Hypertension    Osteomyelitis of foot (HCC)    Peripheral vascular disease (HCC)    "poor circulation" in feet and necrosis    Family History  Family history unknown: Yes    Past Surgical History:  Procedure Laterality Date   AMPUTATION Right 01/13/2014   Procedure: Right great toe amputation;  Surgeon: Jacki Cones, MD;  [facility]: [facility];  Service: Orthopedics;  Laterality: Right;   AMPUTATION Left 06/08/2014   Procedure: AMPUTATION LEFT GREAT TOE;  Surgeon: Verlee Rossetti, MD;  Location: [facility];  Service: Orthopedics;  Laterality: Left;   AMPUTATION Left 07/01/2014   Procedure: LEFT First Metatarsal RAY AMPUTATION ;  Surgeon: Verlee Rossetti, MD;  Location: [facility] OR;  Service: Orthopedics;  Laterality: Left;   AMPUTATION Left 08/28/2014   Procedure: Left Foot Fifth ray resection;  Surgeon: Kathryne Hitch, MD;  Location: [facility];  Service: Orthopedics;  Laterality: Left;   AMPUTATION Left 10/08/2014   Procedure: LEFT FOOT TRANSMETATARSAL AMPUTATION;  Surgeon: Kathryne Hitch, MD;  Location: [facility] OR;  Service: Orthopedics;  Laterality: Left;   AMPUTATION Left 12/04/2014   Procedure: AMPUTATION BELOW KNEE;  Surgeon: Berna Spare  Kandis Mannan, MD;  Location: [facility] OR;  Service: Orthopedics;  Laterality: Left;   AMPUTATION Right 02/25/2016   Procedure: RIGHT BELOW KNEE AMPUTATION;  Surgeon: Nadara Mustard, MD;  Location: [facility] OR;  Service: Orthopedics;  Laterality: Right;   arm surgery Left    due to broken arm   KNEE SURGERY Right    Social History   Occupational History   Not on file  Tobacco Use   Smoking status: Never   Smokeless tobacco: Current    Types: Snuff  Substance and Sexual Activity   Alcohol use: No    Alcohol/week: 0.0 standard drinks of alcohol    Comment: occ    Drug use: No   Sexual activity: Not on file

## 2022-08-30 ENCOUNTER — Telehealth: Payer: Self-pay | Admitting: Orthopedic Surgery

## 2022-08-30 NOTE — Telephone Encounter (Signed)
Patient need an appointment asap he is developing blisters on both his stumps. Sending call to Triage.

## 2022-08-30 NOTE — Telephone Encounter (Signed)
Triage sch appt tomorrow.

## 2022-08-31 ENCOUNTER — Ambulatory Visit (INDEPENDENT_AMBULATORY_CARE_PROVIDER_SITE_OTHER): Payer: 59 | Admitting: Orthopedic Surgery

## 2022-08-31 DIAGNOSIS — Z89512 Acquired absence of left leg below knee: Secondary | ICD-10-CM | POA: Diagnosis not present

## 2022-08-31 DIAGNOSIS — Z89511 Acquired absence of right leg below knee: Secondary | ICD-10-CM

## 2022-09-01 ENCOUNTER — Encounter: Payer: Self-pay | Admitting: Orthopedic Surgery

## 2022-09-01 NOTE — Progress Notes (Signed)
Office Visit Note   Patient: Ryan [name]           Date of Birth: 10-Jul-1961           MRN: [id number] Visit Date: 08/31/2022              Requested by: Tresa Garter, MD [address] [place],  [place] [address] PCP: Tresa Garter, MD  Chief Complaint  Patient presents with   Left Leg - Follow-up    Hx bilat BKA    Right Leg - Follow-up      HPI: Patient is a 62 year old gentleman who is seen in follow-up for bilateral transtibial amputations with ulcerations on both residual limbs.  Assessment & Plan: Visit Diagnoses:  1. S/P bilateral BKA (below knee amputation) ([place])     Plan: Patient was provided a prescription for bionics for new sockets liners materials and supplies.  Follow-Up Instructions: Return if symptoms worsen or fail to improve.   Ortho Exam  Patient is alert, oriented, no adenopathy, well-dressed, normal affect, normal respiratory effort. Examination patient is subsiding into the sockets.  He has ulcerations on both legs worse on the right than the left from end bearing in his socket.  There is no cellulitis no drainage no tunneling.  Patient is an existing bilateral transtibial  amputee.  Patient's current comorbidities are not expected to impact the ability to function with the prescribed prosthesis. Patient verbally communicates a strong desire to use a prosthesis. Patient currently requires mobility aids to ambulate without a prosthesis.  Expects not to use mobility aids with a new prosthesis.  Patient is a K3 level ambulator that spends a lot of time walking around on uneven terrain over obstacles, up and down stairs, and ambulates with a variable cadence.     Imaging: No results found.   Labs: Lab Results  Component Value Date   HGBA1C 6.5 01/17/2017   HGBA1C 6.2 05/25/2016   HGBA1C 6.0 01/14/2016   ESRSEDRATE 92 (H) 08/27/2014   ESRSEDRATE 81 (H) 06/08/2014   ESRSEDRATE 55 (H) 05/26/2014   CRP 20.0 (H) 06/08/2014    CRP 2.7 (H) 05/26/2014   CRP 7.1 (H) 03/24/2014   REPTSTATUS 09/03/2014 FINAL 08/27/2014   GRAMSTAIN Rare 05/07/2014   GRAMSTAIN WBC present-predominately Mononuclear 05/07/2014   GRAMSTAIN Rare Squamous Epithelial Cells Present 05/07/2014   GRAMSTAIN Few Gram Positive Cocci In Pairs In Clusters 05/07/2014   GRAMSTAIN Rare Gram Negative Rods 05/07/2014   CULT  08/27/2014    NO GROWTH 5 DAYS Note: Culture results may be compromised due to an excessive volume of blood received in culture bottles. Performed at [facility] 05/07/2014     Lab Results  Component Value Date   ALBUMIN 4.0 01/17/2017   ALBUMIN 2.9 (L) 08/28/2014   ALBUMIN 2.8 (L) 06/08/2014    No results found for: "MG" No results found for: "VD25OH"  No results found for: "PREALBUMIN"    Latest Ref Rng & Units 03/03/2016   12:00 AM 02/25/2016   11:58 AM 02/25/2015    5:21 PM  CBC EXTENDED  WBC 10^3/mL 8.5     10.2  8.4   RBC 4.22 - 5.81 MIL/uL  3.87  4.47   Hemoglobin 13.5 - 17.5 g/dL 12.8     11.9  13.4   HCT 41 - 53 % 42     35.6  39.8   Platelets 150 - 399 K/L 274  212  218   NEUT# 1.7 - 7.7 K/uL   5.1   Lymph# 0.7 - 4.0 K/uL   2.3      This result is from an external source.     There is no height or weight on file to calculate BMI.  Orders:  No orders of the defined types were placed in this encounter.  No orders of the defined types were placed in this encounter.    Procedures: No procedures performed  Clinical Data: No additional findings.  ROS:  All other systems negative, except as noted in the HPI. Review of Systems  Objective: Vital Signs: There were no vitals taken for this visit.  Specialty Comments:  No specialty comments available.  PMFS History: Patient Active Problem List   Diagnosis Date Noted   Needs flu shot 05/25/2016   Healthcare maintenance 05/25/2016   S/P bilateral BKA (below knee amputation)  ([place]) 02/25/2016   Diabetic ulcer of right foot associated with type 2 diabetes mellitus ([place]) 01/16/2016   H/O osteomyelitis 01/14/2016   Type 2 diabetes mellitus treated without insulin ([place]) 10/14/2015   Dyslipidemia (high LDL; low HDL) 10/14/2015   Essential hypertension 06/23/2014   Diabetic foot ([place]) 05/07/2014   Past Medical History:  Diagnosis Date   Anemia    low iron   Arthritis    Diabetes mellitus without complication (HCC)    type 2   Diabetic neuropathy (HCC)    Diabetic neuropathy (HCC)    Headache    migraines as a child   Heart murmur    was told "not to worry about it"   Hypertension    Osteomyelitis of foot ([place])    Peripheral vascular disease ([place])    "poor circulation" in feet and necrosis    Family History  Family history unknown: Yes    Past Surgical History:  Procedure Laterality Date   AMPUTATION Right 01/13/2014   Procedure: Right great toe amputation;  Surgeon: Tobi Bastos, MD;  Location: [facility];  Service: Orthopedics;  Laterality: Right;   AMPUTATION Left 06/08/2014   Procedure: AMPUTATION LEFT GREAT TOE;  Surgeon: Augustin Schooling, MD;  Location: [facility];  Service: Orthopedics;  Laterality: Left;   AMPUTATION Left 07/01/2014   Procedure: LEFT First Metatarsal RAY AMPUTATION ;  Surgeon: Augustin Schooling, MD;  Location: [place] [facility];  Service: Orthopedics;  Laterality: Left;   AMPUTATION Left 08/28/2014   Procedure: Left Foot Fifth ray resection;  Surgeon: Mcarthur Rossetti, MD;  Location: [facility];  Service: Orthopedics;  Laterality: Left;   AMPUTATION Left 10/08/2014   Procedure: LEFT FOOT TRANSMETATARSAL AMPUTATION;  Surgeon: Mcarthur Rossetti, MD;  Location: [facility];  Service: Orthopedics;  Laterality: Left;   AMPUTATION Left 12/04/2014   Procedure: AMPUTATION BELOW KNEE;  Surgeon: Newt Minion, MD;  Location: [facility];  Service: Orthopedics;  Laterality: Left;   AMPUTATION Right 02/25/2016   Procedure: RIGHT BELOW KNEE AMPUTATION;  Surgeon: Newt Minion, MD;  Location: [place];  Service: Orthopedics;  Laterality: Right;   arm surgery Left    due to broken arm   KNEE SURGERY Right    Social History   Occupational History   Not on file  Tobacco Use   Smoking status: Never   Smokeless tobacco: Current    Types: Snuff  Substance and Sexual Activity   Alcohol use: No    Alcohol/week: 0.0 standard drinks of alcohol    Comment: occ    Drug use:  No   Sexual activity: Not on file

## 2022-11-22 ENCOUNTER — Telehealth: Payer: Self-pay | Admitting: Orthopedic Surgery

## 2022-11-22 NOTE — Telephone Encounter (Signed)
Patient called and advised he needed an appointment ASAP due to Ulcers opened on his stump. I reached out to Autumn, and she opened a space at 2:15 pm for patient. When patient was advised of appointment he became rude and started yelling at me that if he came in tomorrow he would loose his job, I attempted to defuse the situation buy telling him We were trying to simply get him in ASAP. He then said he didn't understand why we couldn't see him Friday. I advised with the severity of his wound it would be in his best interest to be seen tomorrow per Dr. Lajoyce Corners, staff. Just an [place]

## 2022-11-22 NOTE — Telephone Encounter (Signed)
I called and the pt is very upset that his prosthetic is not fitting properly and that he is having pain with walking. Advised that we would be happy to see the pt tomorrow or I can move it to Friday with that works better for him. The pt was then upset that I offered the ppt on Friday and said that he did not think there was an appt available and why was that not given in the first place. I told the pt that I could work him in on the sch with th NP if that worked better but he was ordered an appt tomorrow because he was complaining of pain and open wounds. I advised we can see him whenever he wanted to be seen but that we were trying to help him. The pt continued to be frustrated about his prosthetic and I advised that I understood his frustration and we want to help I just need to confirm what he wanted to do. The pt said he will come in tomorrow.

## 2022-11-23 ENCOUNTER — Ambulatory Visit (INDEPENDENT_AMBULATORY_CARE_PROVIDER_SITE_OTHER): Payer: 59 | Admitting: Orthopedic Surgery

## 2022-11-23 DIAGNOSIS — Z89511 Acquired absence of right leg below knee: Secondary | ICD-10-CM

## 2022-11-23 DIAGNOSIS — Z89512 Acquired absence of left leg below knee: Secondary | ICD-10-CM

## 2022-11-27 ENCOUNTER — Other Ambulatory Visit: Payer: Self-pay

## 2022-11-27 ENCOUNTER — Telehealth: Payer: Self-pay | Admitting: Orthopedic Surgery

## 2022-11-27 ENCOUNTER — Encounter: Payer: Self-pay | Admitting: Orthopedic Surgery

## 2022-11-27 NOTE — Telephone Encounter (Signed)
Note written and brought to the front desk for pick up. Pt walked in the office this morning asking for note.

## 2022-11-27 NOTE — Telephone Encounter (Signed)
Patient asking for a work note . Stating he can be out till Wednesday.Marland Kitchen

## 2022-11-27 NOTE — Progress Notes (Signed)
Office Visit Note   Patient: Ryan [name]           Date of Birth: Oct 16, 1960           MRN: [id number] Visit Date: 11/23/2022              Requested by: Quentin Angst, MD [address] [place],  [place] [address] PCP: Quentin Angst, MD  Chief Complaint  Patient presents with   Right Leg - Wound Check    Hx right BKA      HPI: Patient is a 62 year old gentleman with bilateral transtibial amputee who is quite active working at [employer].  Assessment & Plan: Visit Diagnoses:  1. S/P bilateral BKA (below knee amputation) (HCC)     Plan: Patient was given a prescription to go to biotics to modify the socket possible medial and lateral pads.  Follow-Up Instructions: Return if symptoms worsen or fail to improve.   Ortho Exam  Patient is alert, oriented, no adenopathy, well-dressed, normal affect, normal respiratory effort. Examination patient has a new socket he is subsiding into the socket.  He has an ulcer over the medial femoral condyle a ulcer over the patella tendon and ulcer over the distal tibia.  There is no cellulitis no signs of infection.  Patient was provided a new medium under liner and he states that this feels much better.  Imaging: No results found. No images are attached to the encounter.  Labs: Lab Results  Component Value Date   HGBA1C 6.5 01/17/2017   HGBA1C 6.2 05/25/2016   HGBA1C 6.0 01/14/2016   ESRSEDRATE 92 (H) 08/27/2014   ESRSEDRATE 81 (H) 06/08/2014   ESRSEDRATE 55 (H) 05/26/2014   CRP 20.0 (H) 06/08/2014   CRP 2.7 (H) 05/26/2014   CRP 7.1 (H) 03/24/2014   REPTSTATUS 09/03/2014 FINAL 08/27/2014   GRAMSTAIN Rare 05/07/2014   GRAMSTAIN WBC present-predominately Mononuclear 05/07/2014   GRAMSTAIN Rare Squamous Epithelial Cells Present 05/07/2014   GRAMSTAIN Few Gram Positive Cocci In Pairs In Clusters 05/07/2014   GRAMSTAIN Rare Gram Negative Rods 05/07/2014   CULT  08/27/2014    NO GROWTH 5  DAYS Note: Culture results may be compromised due to an excessive volume of blood received in culture bottles. Performed at [facility] METHICILLIN RESISTANT STAPHYLOCOCCUS AUREUS 05/07/2014     Lab Results  Component Value Date   ALBUMIN 4.0 01/17/2017   ALBUMIN 2.9 (L) 08/28/2014   ALBUMIN 2.8 (L) 06/08/2014    No results found for: "MG" No results found for: "VD25OH"  No results found for: "PREALBUMIN"    Latest Ref Rng & Units 03/03/2016   12:00 AM 02/25/2016   11:58 AM 02/25/2015    5:21 PM  CBC EXTENDED  WBC 10^3/mL 8.5     10.2  8.4   RBC 4.22 - 5.81 MIL/uL  3.87  4.47   Hemoglobin 13.5 - 17.5 g/dL 43.3     29.5  18.8   HCT 41 - 53 % 42     35.6  39.8   Platelets 150 - 399 K/L 274     212  218   NEUT# 1.7 - 7.7 K/uL   5.1   Lymph# 0.7 - 4.0 K/uL   2.3      This result is from an external source.     There is no height or weight on file to calculate BMI.  Orders:  No orders of the defined  types were placed in this encounter.  No orders of the defined types were placed in this encounter.    Procedures: No procedures performed  Clinical Data: No additional findings.  ROS:  All other systems negative, except as noted in the HPI. Review of Systems  Objective: Vital Signs: There were no vitals taken for this visit.  Specialty Comments:  No specialty comments available.  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 2 diabetes mellitus (HCC) 01/16/2016   H/O osteomyelitis 01/14/2016   Type 2 diabetes mellitus treated without insulin (HCC) 10/14/2015   Dyslipidemia (high LDL; low HDL) 10/14/2015   Essential hypertension 06/23/2014   Diabetic foot (HCC) 05/07/2014   Past Medical History:  Diagnosis Date   Anemia    low iron    Arthritis    Diabetes mellitus without complication (HCC)    type 2   Diabetic neuropathy (HCC)    Diabetic neuropathy (HCC)    Headache    migraines as a child   Heart murmur    was told "not to worry about it"   Hypertension    Osteomyelitis of foot (HCC)    Peripheral vascular disease (HCC)    "poor circulation" in feet and necrosis    Family History  Family history unknown: Yes    Past Surgical History:  Procedure Laterality Date   AMPUTATION Right 01/13/2014   Procedure: Right great toe amputation;  Surgeon: Jacki Cones, MD;  Location: [facility];  Service: Orthopedics;  Laterality: Right;   AMPUTATION Left 06/08/2014   Procedure: AMPUTATION LEFT GREAT TOE;  Surgeon: Verlee Rossetti, MD;  Location: [facility];  Service: Orthopedics;  Laterality: Left;   AMPUTATION Left 07/01/2014   Procedure: LEFT First Metatarsal RAY AMPUTATION ;  Surgeon: Verlee Rossetti, MD;  Location[facility];  Service: Orthopedics;  Laterality: Left;   AMPUTATION Left 08/28/2014   Procedure: Left Foot Fifth ray resection;  Surgeon: Kathryne Hitch, MD;  Location: [facility];  Service: Orthopedics;  Laterality: Left;   AMPUTATION Left 10/08/2014   Procedure: LEFT FOOT TRANSMETATARSAL AMPUTATION;  Surgeon: Kathryne Hitch, MD;  Location: [facility] OR;  Service: Orthopedics;  Laterality: Left;   AMPUTATION Left 12/04/2014   Procedure: AMPUTATION BELOW KNEE;  Surgeon: Nadara Mustard, MD;  Location: [facility] OR;  Service: Orthopedics;  Laterality: Left;   AMPUTATION Right 02/25/2016   Procedure: RIGHT BELOW KNEE AMPUTATION;  Surgeon: Nadara Mustard, MD;  Location: [facility] OR;  Service: Orthopedics;  Laterality: Right;   arm surgery Left    due to broken arm   KNEE SURGERY Right    Social History   Occupational History   Not on file  Tobacco Use   Smoking status: Never   Smokeless tobacco: Current    Types: Snuff  Substance and Sexual Activity   Alcohol use: No    Alcohol/week: 0.0 standard drinks of alcohol    Comment:  occ    Drug use: No   Sexual activity: Not on file

## 2023-02-14 ENCOUNTER — Ambulatory Visit: Payer: 59 | Admitting: Family

## 2023-02-14 ENCOUNTER — Encounter: Payer: Self-pay | Admitting: Family

## 2023-02-14 DIAGNOSIS — L02415 Cutaneous abscess of right lower limb: Secondary | ICD-10-CM | POA: Diagnosis not present

## 2023-02-14 DIAGNOSIS — Z89511 Acquired absence of right leg below knee: Secondary | ICD-10-CM | POA: Diagnosis not present

## 2023-02-14 DIAGNOSIS — Z89512 Acquired absence of left leg below knee: Secondary | ICD-10-CM

## 2023-02-14 MED ORDER — SULFAMETHOXAZOLE-TRIMETHOPRIM 800-160 MG PO TABS: 1.0000 | ORAL_TABLET | Freq: Two times a day (BID) | ORAL | 0 refills | Status: DC

## 2023-02-14 MED ORDER — OXYCODONE-ACETAMINOPHEN 5-325 MG PO TABS: 1.0000 | ORAL_TABLET | Freq: Three times a day (TID) | ORAL | 0 refills | Status: DC | PRN

## 2023-02-14 NOTE — Progress Notes (Signed)
Office Visit Note   Patient: Ryan [name]           Date of Birth: [birth year]           MRN: [id number] Visit Date: 02/14/2023              Requested by: Quentin Angst, MD [address] [place],  [place] [address] PCP: Quentin Angst, MD  Chief Complaint  Patient presents with   Right Leg - Follow-up    Hx BKA      HPI: The patient is a 62 year old gentleman who presents today concern for new ulcers problem pressure to his right residual limb he states that he is concerned that his skin is "dying" he thinks this is secondary to rubbing and subsiding into his socket he has been working out in the heat.  He feels that when the liner liner gets wet the rubbing and friction is worse he is having severe pain to the medial aspect of the right knee has some redness and swelling here he does complain of some drainage from this wound  Currently taking naproxen without relief  Assessment & Plan: Visit Diagnoses: No diagnosis found.  Plan: [name] Dial soap cleansing of both ulcers.  He will not wear the socket until the ulcers have healed.  May use Silvadene on the ulcer continue shrinker will place on a course of Bactrim.  Will call in a short course of pain medicine as well.  Follow-Up Instructions: No follow-ups on file.   Ortho Exam  Patient is alert, oriented, no adenopathy, well-dressed, normal affect, normal respiratory effort. On examination of the right residual limb he has 2 ulcers 1 over the distal patella from subsiding into his socket this is 4 mm in diameter filled in with fibrinous tissue there is no drainage no surrounding erythema no warmth to the medial knee he does have an abscess there are about 4 cc of purulence expressed.  There is warmth erythema and induration  Imaging: No results found. No images are attached to the encounter.  Labs: Lab Results  Component Value Date   HGBA1C 6.5 01/17/2017   HGBA1C 6.2 05/25/2016   HGBA1C 6.0 01/14/2016    ESRSEDRATE 92 (H) 08/27/2014   ESRSEDRATE 81 (H) 06/08/2014   ESRSEDRATE 55 (H) 05/26/2014   CRP 20.0 (H) 06/08/2014   CRP 2.7 (H) 05/26/2014   CRP 7.1 (H) 03/24/2014   REPTSTATUS 09/03/2014 FINAL 08/27/2014   GRAMSTAIN Rare 05/07/2014   GRAMSTAIN WBC present-predominately Mononuclear 05/07/2014   GRAMSTAIN Rare Squamous Epithelial Cells Present 05/07/2014   GRAMSTAIN Few Gram Positive Cocci In Pairs In Clusters 05/07/2014   GRAMSTAIN Rare Gram Negative Rods 05/07/2014   CULT  08/27/2014    NO GROWTH 5 DAYS Note: Culture results may be compromised due to an excessive volume of blood received in culture bottles. Performed at [facility]    [facility] METHICILLIN RESISTANT STAPHYLOCOCCUS AUREUS 05/07/2014     Lab Results  Component Value Date   ALBUMIN 4.0 01/17/2017   ALBUMIN 2.9 (L) 08/28/2014   ALBUMIN 2.8 (L) 06/08/2014    No results found for: "MG" No results found for: "VD25OH"  No results found for: "PREALBUMIN"    Latest Ref Rng & Units 03/03/2016   12:00 AM 02/25/2016   11:58 AM 02/25/2015    5:21 PM  CBC EXTENDED  WBC 10^3/mL 8.5     10.2  8.4   RBC 4.22 - 5.81 MIL/uL  3.87  4.47  Hemoglobin 13.5 - 17.5 g/dL 16.1     09.6  04.5   HCT 41 - 53 % 42     35.6  39.8   Platelets 150 - 399 K/L 274     212  218   NEUT# 1.7 - 7.7 K/uL   5.1   Lymph# 0.7 - 4.0 K/uL   2.3      This result is from an external source.     There is no height or weight on file to calculate BMI.  Orders:  No orders of the defined types were placed in this encounter.  Meds ordered this encounter  Medications   oxyCODONE-acetaminophen (PERCOCET/ROXICET) 5-325 MG tablet    Sig: Take 1-2 tablets by mouth every 8 (eight) hours as needed for severe pain.    Dispense:  15 tablet    Refill:  0   sulfamethoxazole-trimethoprim (BACTRIM DS) 800-160 MG tablet    Sig: Take 1 tablet by mouth 2 (two) times [name].    Dispense:  20 tablet    Refill:  0     Procedures: No procedures  performed  Clinical Data: No additional findings.  ROS:  All other systems negative, except as noted in the HPI. Review of Systems  Objective: Vital Signs: There were no vitals taken for this visit.  Specialty Comments:  No specialty comments available.  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 2 diabetes mellitus (HCC) 01/16/2016   H/O osteomyelitis 01/14/2016   Type 2 diabetes mellitus treated without insulin (HCC) 10/14/2015   Dyslipidemia (high LDL; low HDL) 10/14/2015   Essential hypertension 06/23/2014   Diabetic foot (HCC) 05/07/2014   Past Medical History:  Diagnosis Date   Anemia    low iron   Arthritis    Diabetes mellitus without complication (HCC)    type 2   Diabetic neuropathy (HCC)    Diabetic neuropathy (HCC)    Headache    migraines as a child   Heart murmur    was told "not to worry about it"   Hypertension    Osteomyelitis of foot (HCC)    Peripheral vascular disease (HCC)    "poor circulation" in feet and necrosis    Family History  Family history unknown: Yes    Past Surgical History:  Procedure Laterality Date   AMPUTATION Right 01/13/2014   Procedure: Right great toe amputation;  Surgeon: Jacki Cones, MD;  Location: [facility];  Service: Orthopedics;  Laterality: Right;   AMPUTATION Left 06/08/2014   Procedure: AMPUTATION LEFT GREAT TOE;  Surgeon: Verlee Rossetti, MD;  Location: [facility];  Service: Orthopedics;  Laterality: Left;   AMPUTATION Left 07/01/2014   Procedure: LEFT First Metatarsal RAY AMPUTATION ;  Surgeon: Verlee Rossetti, MD;  Location: [facility] OR;  Service: Orthopedics;  Laterality: Left;   AMPUTATION Left 08/28/2014   Procedure: Left Foot Fifth ray resection;  Surgeon: Kathryne Hitch, MD;  Location: [facility];  Service:  Orthopedics;  Laterality: Left;   AMPUTATION Left 10/08/2014   Procedure: LEFT FOOT TRANSMETATARSAL AMPUTATION;  Surgeon: Kathryne Hitch, MD;  Location: [facility] OR;  Service: Orthopedics;  Laterality: Left;   AMPUTATION Left 12/04/2014   Procedure: AMPUTATION BELOW KNEE;  Surgeon: Nadara Mustard, MD;  Location: [facility] OR;  Service: Orthopedics;  Laterality: Left;   AMPUTATION Right 02/25/2016   Procedure: RIGHT BELOW KNEE AMPUTATION;  Surgeon: Nadara Mustard, MD;  Location: [facility] OR;  Service: Orthopedics;  Laterality: Right;   arm surgery Left    due to broken arm   KNEE SURGERY Right    Social History   Occupational History   Not on file  Tobacco Use   Smoking status: Never   Smokeless tobacco: Current    Types: Snuff  Substance and Sexual Activity   Alcohol use: No    Alcohol/week: 0.0 standard drinks of alcohol    Comment: occ    Drug use: No   Sexual activity: Not on file

## 2023-12-03 ENCOUNTER — Ambulatory Visit (INDEPENDENT_AMBULATORY_CARE_PROVIDER_SITE_OTHER): Admitting: Orthopedic Surgery

## 2023-12-03 DIAGNOSIS — Z89511 Acquired absence of right leg below knee: Secondary | ICD-10-CM

## 2023-12-03 DIAGNOSIS — Z89512 Acquired absence of left leg below knee: Secondary | ICD-10-CM | POA: Diagnosis not present

## 2023-12-11 ENCOUNTER — Encounter: Payer: Self-pay | Admitting: Orthopedic Surgery

## 2023-12-11 NOTE — Progress Notes (Signed)
 Office Visit Note   Patient: Ryan [name]           Date of Birth: 1961-03-23           MRN: [id number] Visit Date: 12/03/2023              Requested by: Jegede, Olugbemiga E, MD [address] [place],  [place] [address] PCP: Jegede, Olugbemiga E, MD  Chief Complaint  Patient presents with   Right Leg - Follow-up    HX bilateral BKA    Left Leg - Follow-up      HPI: Patient is a 63 year old gentleman who is bilateral below-knee amputee.  Patient is currently working with bionic.  Assessment & Plan: Visit Diagnoses:  1. S/P bilateral BKA (below knee amputation) (HCC)     Plan: Prescription was provided for new sockets liners and supplies.  Follow-Up Instructions: Return if symptoms worsen or fail to improve.   Ortho Exam  Patient is alert, oriented, no adenopathy, well-dressed, normal affect, normal respiratory effort. Examination patient has an end bearing ulcer on the right distal tibia secondary to subsiding in the socket.  Patient has had socket modifications with not enough pressure offloading.  The liner is torn.  The left leg has an ulcer over the distal fibula.  Patient also has a torn liner with a modified socket but is still subsiding.  Patient is an existing bilateral transtibial  amputee.  Patient's current comorbidities are not expected to impact the ability to function with the prescribed prosthesis. Patient verbally communicates a strong desire to use a prosthesis. Patient currently requires mobility aids to ambulate without a prosthesis.  Expects not to use mobility aids with a new prosthesis. Patient is expected to resume or reach their K Level within 6 months. Patient was active before the amputation and independent with stairs, uneven terrain, varying cadence, and a community ambulator.  Patient is a K3 level ambulator that spends a lot of time walking around on uneven terrain over obstacles, up and down stairs, and ambulates with a variable  cadence.     Imaging: No results found. No images are attached to the encounter.  Labs: Lab Results  Component Value Date   HGBA1C 6.5 01/17/2017   HGBA1C 6.2 05/25/2016   HGBA1C 6.0 01/14/2016   ESRSEDRATE 92 (H) 08/27/2014   ESRSEDRATE 81 (H) 06/08/2014   ESRSEDRATE 55 (H) 05/26/2014   CRP 20.0 (H) 06/08/2014   CRP 2.7 (H) 05/26/2014   CRP 7.1 (H) 03/24/2014   REPTSTATUS 09/03/2014 FINAL 08/27/2014   GRAMSTAIN Rare 05/07/2014   GRAMSTAIN WBC present-predominately Mononuclear 05/07/2014   GRAMSTAIN Rare Squamous Epithelial Cells Present 05/07/2014   GRAMSTAIN Few Gram Positive Cocci In Pairs In Clusters 05/07/2014   GRAMSTAIN Rare Gram Negative Rods 05/07/2014   CULT  08/27/2014    NO GROWTH 5 DAYS Note: Culture results may be compromised due to an excessive volume of blood received in culture bottles. Performed at [facility] METHICILLIN RESISTANT STAPHYLOCOCCUS AUREUS 05/07/2014     Lab Results  Component Value Date   ALBUMIN 4.0 01/17/2017   ALBUMIN 2.9 (L) 08/28/2014   ALBUMIN 2.8 (L) 06/08/2014    No results found for: "MG" No results found for: "VD25OH"  No results found for: "PREALBUMIN"    Latest Ref Rng & Units 03/03/2016   12:00 AM 02/25/2016   11:58 AM 02/25/2015    5:21 PM  CBC EXTENDED  WBC 10^3/mL 8.5  10.2  8.4   RBC 4.22 - 5.81 MIL/uL  3.87  4.47   Hemoglobin 13.5 - 17.5 g/dL 16.1     09.6  04.5   HCT 41 - 53 % 42     35.6  39.8   Platelets 150 - 399 K/L 274     212  218   NEUT# 1.7 - 7.7 K/uL   5.1   Lymph# 0.7 - 4.0 K/uL   2.3      This result is from an external source.     There is no height or weight on file to calculate BMI.  Orders:  No orders of the defined types were placed in this encounter.  No orders of the defined types were placed in this encounter.    Procedures: No procedures performed  Clinical Data: No additional findings.  ROS:  All other systems negative, except as noted in the  HPI. Review of Systems  Objective: Vital Signs: There were no vitals taken for this visit.  Specialty Comments:  No specialty comments available.  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 2 diabetes mellitus (HCC) 01/16/2016   H/O osteomyelitis 01/14/2016   Type 2 diabetes mellitus treated without insulin  (HCC) 10/14/2015   Dyslipidemia (high LDL; low HDL) 10/14/2015   Essential hypertension 06/23/2014   Diabetic foot (HCC) 05/07/2014   Past Medical History:  Diagnosis Date   Anemia    low iron   Arthritis    Diabetes mellitus without complication (HCC)    type 2   Diabetic neuropathy (HCC)    Diabetic neuropathy (HCC)    Headache    migraines as a child   Heart murmur    was told "not to worry about it"   Hypertension    Osteomyelitis of foot (HCC)    Peripheral vascular disease (HCC)    "poor circulation" in feet and necrosis    Family History  Family history unknown: Yes    Past Surgical History:  Procedure Laterality Date   AMPUTATION Right 01/13/2014   Procedure: Right great toe amputation;  Surgeon: Florencia Hunter, MD;  Location: [facility];  Service: Orthopedics;  Laterality: Right;   AMPUTATION Left 06/08/2014   Procedure: AMPUTATION LEFT GREAT TOE;  Surgeon: Lorriane Rote, MD;  Location: [facility];  Service: Orthopedics;  Laterality: Left;   AMPUTATION Left 07/01/2014   Procedure: LEFT First Metatarsal RAY AMPUTATION ;  Surgeon: Lorriane Rote, MD;  Location: [facility] OR;  Service: Orthopedics;  Laterality: Left;   AMPUTATION Left 08/28/2014   Procedure: Left Foot Fifth ray resection;  Surgeon: Arnie Lao, MD;  Location: [facility];  Service: Orthopedics;  Laterality: Left;   AMPUTATION Left 10/08/2014   Procedure: LEFT FOOT TRANSMETATARSAL AMPUTATION;  Surgeon:  Arnie Lao, MD;  Location: [facility] OR;  Service: Orthopedics;  Laterality: Left;   AMPUTATION Left 12/04/2014   Procedure: AMPUTATION BELOW KNEE;  Surgeon: Timothy Ford, MD;  Location: [facility] OR;  Service: Orthopedics;  Laterality: Left;   AMPUTATION Right 02/25/2016   Procedure: RIGHT BELOW KNEE AMPUTATION;  Surgeon: Timothy Ford, MD;  Location: [facility] OR;  Service: Orthopedics;  Laterality: Right;   arm surgery Left    due to broken arm   KNEE SURGERY Right    Social History  Occupational History   Not on file  Tobacco Use   Smoking status: Never   Smokeless tobacco: Current    Types: Snuff  Substance and Sexual Activity   Alcohol use: No    Alcohol/week: 0.0 standard drinks of alcohol    Comment: occ    Drug use: No   Sexual activity: Not on file

## 2024-01-01 ENCOUNTER — Ambulatory Visit (INDEPENDENT_AMBULATORY_CARE_PROVIDER_SITE_OTHER): Admitting: Family

## 2024-01-01 DIAGNOSIS — Z89511 Acquired absence of right leg below knee: Secondary | ICD-10-CM

## 2024-01-01 DIAGNOSIS — Z89512 Acquired absence of left leg below knee: Secondary | ICD-10-CM

## 2024-01-02 ENCOUNTER — Telehealth: Payer: Self-pay | Admitting: Family

## 2024-01-02 ENCOUNTER — Encounter: Payer: Self-pay | Admitting: Family

## 2024-01-02 ENCOUNTER — Ambulatory Visit (INDEPENDENT_AMBULATORY_CARE_PROVIDER_SITE_OTHER): Admitting: Family

## 2024-01-02 DIAGNOSIS — Z89512 Acquired absence of left leg below knee: Secondary | ICD-10-CM

## 2024-01-02 DIAGNOSIS — Z89511 Acquired absence of right leg below knee: Secondary | ICD-10-CM | POA: Diagnosis not present

## 2024-01-02 MED ORDER — OXYCODONE-ACETAMINOPHEN 5-325 MG PO TABS: 1.0000 | ORAL_TABLET | Freq: Three times a day (TID) | ORAL | 0 refills | Status: DC | PRN

## 2024-01-02 NOTE — Addendum Note (Signed)
 Addended by: Butch Cashing on: 01/02/2024 03:46 PM   Modules accepted: Orders

## 2024-01-02 NOTE — Telephone Encounter (Signed)
 Patient called said that Erin number isn't working and [facility]anger never got the prescription. The pharmacy said that the prescription isn't correct either. CB#[phone]

## 2024-01-02 NOTE — Progress Notes (Signed)
 Office Visit Note   Patient: Ryan [name]           Date of Birth: 01-04-1961           MRN: [id number] Visit Date: 01/02/2024              Requested by: Jegede, Olugbemiga E, MD [address] [place],  [place] [address] PCP: Jegede, Olugbemiga E, MD  Chief Complaint  Patient presents with   Right Leg - Follow-up    HX bilateral BKA    Left Leg - Follow-up      HPI: The patient is a 63 year old gentleman who is seen for concern of ulceration to his right residual limb.  He complains of subsiding into his socket especially on the right and having some areas of friction and pain with wear of his prosthesis.  He is well-known to our clinic as well as [facility] he has previously been unhappy with prostheses management at [facility] he has also been seen by [facility]ionic and has had difficulty getting an appointment scheduled with them  His current liner on the right is worn out and broken down and tears in the silicon liner are causing skin breakdown to his residual limb on the right  Assessment & Plan: Visit Diagnoses: No diagnosis found.  Plan: Personally called [facility] myself the patient will follow-up with them for socket modification and supplies the patient is in agreement with the plan  Given an order for supplies  Follow-Up Instructions: No follow-ups on file.   Ortho Exam  Patient is alert, oriented, no adenopathy, well-dressed, normal affect, normal respiratory effort. On examination right residual limb some hyperkeratotic tissue buildup distally as well as over the tibial tubercle about 2 cm in diameter.  To the distal aspect there is a 1 cm length fissure there is no drainage or erythema today  this is well consolidated there is no erythema no cellulitis no drainage    Imaging: No results found. No images are attached to the encounter.  Labs: Lab Results  Component Value Date   HGBA1C 6.5 01/17/2017   HGBA1C 6.2 05/25/2016   HGBA1C 6.0 01/14/2016    ESRSEDRATE 92 (H) 08/27/2014   ESRSEDRATE 81 (H) 06/08/2014   ESRSEDRATE 55 (H) 05/26/2014   CRP 20.0 (H) 06/08/2014   CRP 2.7 (H) 05/26/2014   CRP 7.1 (H) 03/24/2014   REPTSTATUS 09/03/2014 FINAL 08/27/2014   GRAMSTAIN Rare 05/07/2014   GRAMSTAIN WBC present-predominately Mononuclear 05/07/2014   GRAMSTAIN Rare Squamous Epithelial Cells Present 05/07/2014   GRAMSTAIN Few Gram Positive Cocci In Pairs In Clusters 05/07/2014   GRAMSTAIN Rare Gram Negative Rods 05/07/2014   CULT  08/27/2014    NO GROWTH 5 DAYS Note: Culture results may be compromised due to an excessive volume of blood received in culture bottles. Performed at [facility]    LABORGA METHICILLIN RESISTANT STAPHYLOCOCCUS AUREUS 05/07/2014     Lab Results  Component Value Date   ALBUMIN 4.0 01/17/2017   ALBUMIN 2.9 (L) 08/28/2014   ALBUMIN 2.8 (L) 06/08/2014    No results found for: "MG" No results found for: "VD25OH"  No results found for: "PREALBUMIN"    Latest Ref Rng & Units 03/03/2016   12:00 AM 02/25/2016   11:58 AM 02/25/2015    5:21 PM  CBC EXTENDED  WBC 10^3/mL 8.5     10.2  8.4   RBC 4.22 - 5.81 MIL/uL  3.87  4.47   Hemoglobin 13.5 - 17.5 g/dL  12.8     11.9  13.4   HCT 41 - 53 % 42     35.6  39.8   Platelets 150 - 399 K/L 274     212  218   NEUT# 1.7 - 7.7 K/uL   5.1   Lymph# 0.7 - 4.0 K/uL   2.3      This result is from an external source.     There is no height or weight on file to calculate BMI.  Orders:  No orders of the defined types were placed in this encounter.  Meds ordered this encounter  Medications   oxyCODONE -acetaminophen  (PERCOCET/ROXICET) 5-325 MG tablet    Sig: Take 1 tablet by mouth every 8 (eight) hours as needed for severe pain (pain score 7-10).    Dispense:  21 tablet    Refill:  0     Procedures: No procedures performed  Clinical Data: No additional findings.  ROS:  All other systems negative, except as noted in the HPI. Review of  Systems  Objective: Vital Signs: There were no vitals taken for this visit.  Specialty Comments:  No specialty comments available.  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 2 diabetes mellitus (HCC) 01/16/2016   H/O osteomyelitis 01/14/2016   Type 2 diabetes mellitus treated without insulin  (HCC) 10/14/2015   Dyslipidemia (high LDL; low HDL) 10/14/2015   Essential hypertension 06/23/2014   Diabetic foot (HCC) 05/07/2014   Past Medical History:  Diagnosis Date   Anemia    low iron   Arthritis    Diabetes mellitus without complication (HCC)    type 2   Diabetic neuropathy (HCC)    Diabetic neuropathy (HCC)    Headache    migraines as a child   Heart murmur    was told "not to worry about it"   Hypertension    Osteomyelitis of foot (HCC)    Peripheral vascular disease (HCC)    "poor circulation" in feet and necrosis    Family History  Family history unknown: Yes    Past Surgical History:  Procedure Laterality Date   AMPUTATION Right 01/13/2014   Procedure: Right great toe amputation;  Surgeon: Florencia Hunter, MD;  Location: [facility];  Service: Orthopedics;  Laterality: Right;   AMPUTATION Left 06/08/2014   Procedure: AMPUTATION LEFT GREAT TOE;  Surgeon: Lorriane Rote, MD;  Location: [facility];  Service: Orthopedics;  Laterality: Left;   AMPUTATION Left 07/01/2014   Procedure: LEFT First Metatarsal RAY AMPUTATION ;  Surgeon: Lorriane Rote, MD;  [facility]: [facility];  Service: Orthopedics;  Laterality: Left;   AMPUTATION Left 08/28/2014   Procedure: Left Foot Fifth ray resection;  Surgeon: Arnie Lao, MD;  Location: [facility];  Service: Orthopedics;  Laterality: Left;   AMPUTATION Left 10/08/2014   Procedure: LEFT FOOT TRANSMETATARSAL AMPUTATION;  Surgeon: Arnie Lao, MD;  Location: [facility] OR;  Service: Orthopedics;  Laterality: Left;   AMPUTATION Left 12/04/2014   Procedure: AMPUTATION BELOW KNEE;  Surgeon: Timothy Ford, MD;  Location: [facility] OR;  Service: Orthopedics;  Laterality: Left;   AMPUTATION Right 02/25/2016   Procedure: RIGHT BELOW KNEE AMPUTATION;  Surgeon: Timothy Ford, MD;  Location: [facility] OR;  Service: Orthopedics;  Laterality: Right;   arm surgery Left  due to broken arm   KNEE SURGERY Right    Social History   Occupational History   Not on file  Tobacco Use   Smoking status: Never   Smokeless tobacco: Current    Types: Snuff  Substance and Sexual Activity   Alcohol use: No    Alcohol/week: 0.0 standard drinks of alcohol    Comment: occ    Drug use: No   Sexual activity: Not on file

## 2024-01-03 ENCOUNTER — Telehealth: Payer: Self-pay | Admitting: Family

## 2024-01-03 NOTE — Telephone Encounter (Signed)
 Pt states he need a new Rx to be sent to [facility]. The Rx Erin sent yesterday did not include him getting a new prosthetic along with the liners and supplies.

## 2024-01-03 NOTE — Telephone Encounter (Signed)
 This is a duplicate message. Will address with Erin tomorrow morning when she comes into the office.

## 2024-01-03 NOTE — Telephone Encounter (Signed)
 Rx filled out to state prosthetic, supplies and materials for bilateral BKA. Sending to [facility] through fax.

## 2024-01-30 ENCOUNTER — Telehealth: Payer: Self-pay | Admitting: Orthopedic Surgery

## 2024-01-30 NOTE — Telephone Encounter (Signed)
 Can you please call pt. If he can't put any pressure on his leg he needs an appt. He was here 1 month ago. Please call to sch next available with Erin or Dr. Harden please. Thanks!

## 2024-01-30 NOTE — Progress Notes (Signed)
 Ryan [name]

## 2024-01-30 NOTE — Telephone Encounter (Signed)
 Patient called and said can he get a note for work. He can't put any pressure on his leg. RA#[id number]

## 2024-01-31 ENCOUNTER — Ambulatory Visit (INDEPENDENT_AMBULATORY_CARE_PROVIDER_SITE_OTHER): Admitting: Orthopedic Surgery

## 2024-01-31 DIAGNOSIS — Z89512 Acquired absence of left leg below knee: Secondary | ICD-10-CM

## 2024-01-31 DIAGNOSIS — Z89511 Acquired absence of right leg below knee: Secondary | ICD-10-CM

## 2024-02-04 ENCOUNTER — Telehealth: Payer: Self-pay | Admitting: Orthopedic Surgery

## 2024-02-04 NOTE — Telephone Encounter (Signed)
 Pt came in office asking for referral to be sent to [facility] for new prostatics. Fax referral to [phone]. They number is [phone].

## 2024-02-05 ENCOUNTER — Encounter: Payer: Self-pay | Admitting: Orthopedic Surgery

## 2024-02-05 NOTE — Progress Notes (Addendum)
 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  Patient has been having increasing sweating within the liner with subsiding and developing ulceration.  Assessment & Plan: Visit Diagnoses:  1. S/P bilateral BKA (below knee amputation) (HCC)     Plan: Will bring in some prosthetic under liners for the patient to use.  He is given a note to be out of work.  Follow-Up Instructions: Return if symptoms worsen or fail to improve.   Ortho Exam  Patient is alert, oriented, no adenopathy, well-dressed, normal affect, normal respiratory effort. Examination patient has an ulcer over the left fibular head and medial femoral condyle.  On the right transtibial amputation he has an end ulcer as well as an ulcer over the patella both from subsiding into the socket.  Patient is an existing bilateral transtibial  amputee.  Patient's current comorbidities are not expected to impact the ability to function with the prescribed prosthesis. Patient verbally communicates a strong desire to use a prosthesis. Patient currently requires mobility aids to ambulate without a prosthesis.  Expects not to use mobility aids with a new prosthesis. Patient is expected to resume or reach their K Level within 6 months. Patient was active before the amputation and independent with stairs, uneven terrain, varying cadence, and a community ambulator.  Patient is a K3 level ambulator that spends a lot of time walking around on uneven terrain over obstacles, up and down stairs, and ambulates with a variable  cadence.       Imaging: No results found. No images are attached to the encounter.  Labs: Lab Results  Component Value Date   HGBA1C 6.5 01/17/2017   HGBA1C 6.2 05/25/2016   HGBA1C 6.0 01/14/2016   ESRSEDRATE 92 (H) 08/27/2014   ESRSEDRATE 81 (H) 06/08/2014   ESRSEDRATE 55 (H) 05/26/2014   CRP 20.0 (H) 06/08/2014   CRP 2.7 (H) 05/26/2014   CRP 7.1 (H) 03/24/2014   REPTSTATUS 09/03/2014 FINAL 08/27/2014   GRAMSTAIN Rare 05/07/2014   GRAMSTAIN WBC present-predominately Mononuclear 05/07/2014   GRAMSTAIN Rare Squamous Epithelial Cells Present 05/07/2014   GRAMSTAIN Few Gram Positive Cocci In Pairs In Clusters 05/07/2014   GRAMSTAIN Rare Gram Negative Rods 05/07/2014   CULT  08/27/2014    NO GROWTH 5 DAYS Note: Culture results may be compromised due to an excessive volume of blood received in culture bottles. Performed at [facility]    LABORGA METHICILLIN RESISTANT STAPHYLOCOCCUS AUREUS 05/07/2014     Lab Results  Component Value Date   ALBUMIN 4.0 01/17/2017   ALBUMIN 2.9 (L) 08/28/2014   ALBUMIN 2.8 (L) 06/08/2014    No results found for: MG No results found for: VD25OH  No results found for: PREALBUMIN    Latest Ref Rng & Units 03/03/2016   12:00 AM 02/25/2016   11:58 AM 02/25/2015    5:21 PM  CBC EXTENDED  WBC 10^3/mL 8.5  10.2  8.4   RBC 4.22 - 5.81 MIL/uL  3.87  4.47   Hemoglobin 13.5 - 17.5 g/dL 87.1     88.0  86.5   HCT 41 - 53 % 42     35.6  39.8   Platelets 150 - 399 K/L 274     212  218   NEUT# 1.7 - 7.7 K/uL   5.1   Lymph# 0.7 - 4.0 K/uL   2.3      This result is from an external source.     There is no height or weight on file to calculate BMI.  Orders:  No orders of the defined types were placed in this encounter.  No orders of the defined types were placed in this encounter.    Procedures: No procedures performed  Clinical Data: No additional findings.  ROS:  All other systems negative, except as noted in  the HPI. Review of Systems  Objective: Vital Signs: There were no vitals taken for this visit.  Specialty Comments:  No specialty comments available.  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 2 diabetes mellitus (HCC) 01/16/2016   H/O osteomyelitis 01/14/2016   Type 2 diabetes mellitus treated without insulin  (HCC) 10/14/2015   Dyslipidemia (high LDL; low HDL) 10/14/2015   Essential hypertension 06/23/2014   Diabetic foot (HCC) 05/07/2014   Past Medical History:  Diagnosis Date   Anemia    low iron   Arthritis    Diabetes mellitus without complication (HCC)    type 2   Diabetic neuropathy (HCC)    Diabetic neuropathy (HCC)    Headache    migraines as a child   Heart murmur    was told not to worry about it   Hypertension    Osteomyelitis of foot (HCC)    Peripheral vascular disease (HCC)    poor circulation in feet and necrosis    Family History  Family history unknown: Yes    Past Surgical History:  Procedure Laterality Date   AMPUTATION Right 01/13/2014   Procedure: Right great toe amputation;  Surgeon: Tanda DELENA Heading, MD;  Location: [facility];  Service: Orthopedics;  Laterality: Right;   AMPUTATION Left 06/08/2014   Procedure: AMPUTATION LEFT GREAT TOE;  Surgeon: Elspeth JONELLE Her, MD;  Location: [facility];  Service: Orthopedics;  Laterality: Left;   AMPUTATION Left 07/01/2014   Procedure: LEFT First Metatarsal RAY AMPUTATION ;  Surgeon: Elspeth JONELLE Her, MD;  Location: [facility] OR;  Service: Orthopedics;  Laterality: Left;   AMPUTATION Left 08/28/2014   Procedure: Left Foot Fifth ray resection;  Surgeon: Lonni CINDERELLA Poli, MD;  Location: [facility];  Service: Orthopedics;  Laterality: Left;   AMPUTATION Left 10/08/2014   Procedure: LEFT FOOT TRANSMETATARSAL AMPUTATION;   Surgeon: Lonni CINDERELLA Poli, MD;  Location: [facility] OR;  Service: Orthopedics;  Laterality: Left;   AMPUTATION Left 12/04/2014   Procedure: AMPUTATION BELOW KNEE;  Surgeon: Jerona Harden GAILS, MD;  Location: [facility] OR;  Service: Orthopedics;  Laterality: Left;   AMPUTATION Right 02/25/2016   Procedure: RIGHT BELOW KNEE AMPUTATION;  Surgeon: Jerona GAILS Harden, MD;  Location: [facility] OR;  Service: Orthopedics;  Laterality: Right;   arm surgery Left    due to broken arm   KNEE SURGERY Right    Social History  Occupational History   Not on file  Tobacco Use   Smoking status: Never   Smokeless tobacco: Current    Types: Snuff  Substance and Sexual Activity   Alcohol use: No    Alcohol/week: 0.0 standard drinks of alcohol    Comment: occ    Drug use: No   Sexual activity: Not on file

## 2024-02-06 NOTE — Telephone Encounter (Signed)
 Referral/order sent to place below.

## 2024-02-20 ENCOUNTER — Telehealth: Payer: Self-pay | Admitting: Family

## 2024-02-20 ENCOUNTER — Other Ambulatory Visit: Payer: Self-pay | Admitting: Orthopedic Surgery

## 2024-02-20 ENCOUNTER — Telehealth: Payer: Self-pay | Admitting: Orthopedic Surgery

## 2024-02-20 ENCOUNTER — Telehealth: Payer: Self-pay

## 2024-02-20 NOTE — Telephone Encounter (Signed)
 Pt came in office and called a fews time for an  work note. Pt states he need this note for work tonight so he can turn it in for tomorrow. Please call pt about this matter at [phone].

## 2024-02-20 NOTE — Telephone Encounter (Signed)
 Pt came into office stating need new script for prostatic leg due to crack in leg. Please send script Certified Prosthetist Orthotist. Please fax to [phone]. Pt also asking for a note of of work this week. Please call t when ready for pick up [phone].

## 2024-02-20 NOTE — Telephone Encounter (Signed)
 Working on work note, message sent to [employer] officer, [employer] to see if they can print off if he comes by the office again today.

## 2024-02-20 NOTE — Telephone Encounter (Signed)
 Looking through his chart, he was given a work note at last OV to be out of work until 02/29/24.

## 2024-02-20 NOTE — Telephone Encounter (Signed)
 This has been taken care of. Wrote him out until 02/29/24, we can extend it if we need to

## 2024-02-20 NOTE — Telephone Encounter (Signed)
 Patient called again concerning a work note.  Stated that he will need the note by this evening.  CB# [phone].  Please advise.  Thank you.

## 2024-03-26 ENCOUNTER — Telehealth: Payer: Self-pay | Admitting: Orthopedic Surgery

## 2024-03-26 NOTE — Telephone Encounter (Signed)
 Yes he signed off on this today.

## 2024-03-26 NOTE — Telephone Encounter (Signed)
 Warren from [facility] called. Says an order was faxed on 8/14 8/19 8/22 for new prosthetic. Would like to know if Dr. Harden will be signing off on the orders? Her cb# [phone] ext [phone]

## 2024-04-24 NOTE — Progress Notes (Signed)
 Ryan [name]                                          MRN: [id number]   04/24/2024   The [facility] Quality Team Specialist reviewed this patient medical record for the purposes of chart review for care gap closure. The following were reviewed: chart review for care gap closure-diabetic eye exam.    [facility] Quality Team

## 2024-06-02 ENCOUNTER — Encounter: Payer: Self-pay | Admitting: Radiology

## 2024-06-18 ENCOUNTER — Emergency Department (HOSPITAL_COMMUNITY)

## 2024-06-18 ENCOUNTER — Other Ambulatory Visit: Payer: Self-pay

## 2024-06-18 ENCOUNTER — Emergency Department (HOSPITAL_COMMUNITY)
Admission: EM | Admit: 2024-06-18 | Discharge: 2024-06-18 | Disposition: A | Discharge status: Home / Self Care | Attending: Emergency Medicine | Admitting: Emergency Medicine

## 2024-06-18 DIAGNOSIS — E119 Type 2 diabetes mellitus without complications: Secondary | ICD-10-CM | POA: Insufficient documentation

## 2024-06-18 DIAGNOSIS — I10 Essential (primary) hypertension: Secondary | ICD-10-CM | POA: Diagnosis not present

## 2024-06-18 DIAGNOSIS — Y9301 Activity, walking, marching and hiking: Secondary | ICD-10-CM | POA: Diagnosis not present

## 2024-06-18 DIAGNOSIS — W19XXXA Unspecified fall, initial encounter: Secondary | ICD-10-CM | POA: Diagnosis not present

## 2024-06-18 DIAGNOSIS — M25511 Pain in right shoulder: Secondary | ICD-10-CM | POA: Diagnosis not present

## 2024-06-18 DIAGNOSIS — S43014A Anterior dislocation of right humerus, initial encounter: Secondary | ICD-10-CM | POA: Insufficient documentation

## 2024-06-18 DIAGNOSIS — S43004A Unspecified dislocation of right shoulder joint, initial encounter: Secondary | ICD-10-CM

## 2024-06-18 DIAGNOSIS — S4991XA Unspecified injury of right shoulder and upper arm, initial encounter: Secondary | ICD-10-CM | POA: Diagnosis present

## 2024-06-18 MED ORDER — ONDANSETRON HCL 4 MG/2ML IJ SOLN
4.0000 mg | Freq: Once | INTRAMUSCULAR | Status: AC
  Administered 2024-06-18: 4 mg via INTRAVENOUS
  Filled 2024-06-18: qty 2

## 2024-06-18 MED ORDER — HYDROMORPHONE HCL 1 MG/ML IJ SOLN
1.0000 mg | Freq: Once | INTRAMUSCULAR | Status: AC
  Administered 2024-06-18: 1 mg via INTRAVENOUS
  Filled 2024-06-18: qty 1

## 2024-06-18 MED ORDER — LORAZEPAM 2 MG/ML IJ SOLN
1.0000 mg | Freq: Once | INTRAMUSCULAR | Status: AC
  Administered 2024-06-18: 1 mg via INTRAVENOUS
  Filled 2024-06-18: qty 1

## 2024-06-18 MED ORDER — LIDOCAINE HCL 2 % IJ SOLN
10.0000 mL | Freq: Once | INTRAMUSCULAR | Status: AC
  Administered 2024-06-18: 200 mg
  Filled 2024-06-18: qty 20

## 2024-06-18 MED ORDER — MORPHINE SULFATE (PF) 4 MG/ML IV SOLN: 4.0000 mg | Freq: Once | INTRAVENOUS | Status: DC

## 2024-06-18 MED ORDER — OXYCODONE-ACETAMINOPHEN 5-325 MG PO TABS
1.0000 | ORAL_TABLET | Freq: Four times a day (QID) | ORAL | 0 refills | Status: AC | PRN
  Filled 2024-06-18: qty 10, 3d supply, fill #0

## 2024-06-18 MED ORDER — MORPHINE SULFATE (PF) 4 MG/ML IV SOLN
4.0000 mg | Freq: Once | INTRAVENOUS | Status: AC
  Administered 2024-06-18: 4 mg via INTRAMUSCULAR
  Filled 2024-06-18: qty 1

## 2024-06-18 NOTE — Progress Notes (Signed)
 Orthopedic Tech Progress Note Patient Details:  Ryan [name] November 28, 1960 [id number]  Ortho Devices Type of Ortho Device: Shoulder immobilizer Ortho Device/Splint Location: right Ortho Device/Splint Interventions: Ordered, Application, Adjustment   Post Interventions Patient Tolerated: Well Instructions Provided: Adjustment of device, Care of device  [name] 06/18/2024, 6:22 PM

## 2024-06-18 NOTE — ED Provider Notes (Signed)
  Physical Exam  BP 106/72   Pulse 92   Temp 98 F (36.7 C)   Resp 16   SpO2 100%   Physical Exam  Procedures  .Nerve Block  Date/Time: 06/18/2024 10:37 PM  Performed by: Patt Alm Macho, MD Authorized by: Patt Alm Macho, MD   Consent:    Consent obtained:  Verbal   Consent given by:  Patient   Risks, benefits, and alternatives were discussed: yes     Risks discussed:  Allergic reaction, infection and nerve damage   Alternatives discussed:  No treatment Universal protocol:    Procedure explained and questions answered to patient or proxy's satisfaction: yes     Patient identity confirmed:  Verbally with patient Indications:    Indications:  Pain relief Location:    Body area:  Trunk   Laterality:  Right Pre-procedure details:    Skin preparation:  Chlorhexidine    Preparation: Patient was prepped and draped in usual sterile fashion   Skin anesthesia:    Skin anesthesia method:  None Procedure details:    Block needle gauge:  25 G   Guidance: ultrasound     Anesthetic injected:  Lidocaine  2% w/o epi   Steroid injected:  None   Additive injected:  None Post-procedure details:    Dressing:  None   Outcome:  Anesthesia achieved   Procedure completion:  Tolerated .Reduction of dislocation  Date/Time: 06/18/2024 10:41 PM  Performed by: Patt Alm Macho, MD Authorized by: Patt Alm Macho, MD  Consent: Verbal consent obtained Risks and benefits: risks, benefits and alternatives were discussed Consent given by: patient Patient understanding: patient states understanding of the procedure being performed Patient consent: the patient's understanding of the procedure matches consent given Required items: required blood products, implants, devices, and special equipment available Patient identity confirmed: verbally with patient Time out: Immediately prior to procedure a time out was called to verify the correct patient, procedure, equipment, support staff and  site/side marked as required. Preparation: Patient was prepped and draped in the usual sterile fashion. Local anesthesia used: yes Anesthesia: nerve block  Anesthesia: Local anesthesia used: yes Local Anesthetic: lidocaine  2% without epinephrine  Anesthetic total: 10 mL  Sedation: Patient sedated: no  Patient tolerance: patient tolerated the procedure well with no immediate complications     ED Course / MDM    Medical Decision Making I provided a substantive portion of the care of this patient.  I personally made/approved the management plan for this patient and take responsibility for the patient management.    Patient is here with right shoulder dislocation.  Patient had a mechanical fall and dislocated his shoulder.  Patient has bilateral BKA.  I was able to perform interscalene nerve block and achieved anesthesia.  I was able to reduce the shoulder at bedside confirmed by x-ray.  Patient will be discharged home with pain medicine and Ortho follow-up.  Amount and/or Complexity of Data Reviewed Radiology: ordered.  Risk Prescription drug management.          Patt Alm Macho, MD 06/18/24 [phone]

## 2024-06-18 NOTE — ED Provider Notes (Signed)
 [place] [facility] AT [facility] Provider Note   CSN: [id number] Arrival date & time: 06/18/24  1502     Patient presents with: Shoulder Injury   Ryan [name] is a 63 y.o. male.  Patient is a 63 year old male with a history of hypertension, type 2 diabetes, and dyslipidemia who presents to the ED for right shoulder pain after fall.  Patient notes he was walking down the hall when he lost his footing, causing him to trip and hit the wall.  He notes his shoulder went back and he is having continued right shoulder pain.  Denies any previous fractures or surgeries to the right arm.  Notes pain is radiating down the entire arm.  Denies hitting head or loss of consciousness.  Patient is not on blood thinners.  No further complaints.  Shoulder Injury       Prior to Admission medications   Medication Sig Start Date End Date Taking? Authorizing Provider  oxyCODONE -acetaminophen  (PERCOCET/ROXICET) 5-325 MG tablet Take 1 tablet by mouth every 6 (six) hours as needed for up to 10 doses for severe pain (pain score 7-10). 06/18/24  Yes Brevon Dewald S, PA-C  ACCU-CHEK AVIVA PLUS test strip USE TO CHECK BLOOD SUGAR THREE TIMES DAILY AS DIRECTED 01/24/18   Jegede, Olugbemiga E, MD  Blood Glucose Monitoring Suppl (ACCU-CHEK AVIVA PLUS) w/Device KIT 1 each by Does not apply route 3 (three) times daily. 05/25/16   Jegede, Olugbemiga E, MD  ibuprofen  (ADVIL ) 800 MG tablet Take 1 tablet (800 mg total) by mouth every 8 (eight) hours as needed. 02/02/22   Harden Jerona GAILS, MD  ibuprofen  (ADVIL ,MOTRIN ) 400 MG tablet Take 400 mg by mouth every 6 (six) hours as needed.    [provider]  Lancet Devices [facility]) lancets Use as instructed 05/25/16   Jegede, Olugbemiga E, MD  pravastatin  (PRAVACHOL ) 20 MG tablet Take 1 tablet (20 mg total) by mouth daily. 05/25/16   Jegede, Olugbemiga E, MD  vitamin B-12 (CYANOCOBALAMIN ) 100 MCG tablet Take 100 mcg by mouth daily.     [provider]    Allergies: Metformin  and related    Review of Systems  Musculoskeletal:  Positive for arthralgias.  Skin:  Negative for wound.  All other systems reviewed and are negative.   Updated Vital Signs BP 106/72   Pulse 92   Temp 98 F (36.7 C)   Resp 16   SpO2 100%   Physical Exam Constitutional:      Appearance: Normal appearance.  HENT:     Head: Normocephalic and atraumatic.     Mouth/Throat:     Mouth: Mucous membranes are moist.     Pharynx: Oropharynx is clear.  Cardiovascular:     Rate and Rhythm: Normal rate.  Pulmonary:     Effort: Pulmonary effort is normal.  Musculoskeletal:     Comments: Radial pulse 2+ bilaterally.  Equal handgrip bilaterally.  Tender to palpation over anterior shoulder.  Obvious deformity noted to the right shoulder, concerns for dislocation.  Limited  range of motion of the right shoulder secondary to pain  Skin:    General: Skin is warm and dry.  Neurological:     Mental Status: He is alert and oriented to person, place, and time.  Psychiatric:        Mood and Affect: Mood normal.        Behavior: Behavior normal.     (all labs ordered are listed, but only abnormal results are  displayed) Labs Reviewed - No data to display  EKG: None  Radiology: DG Shoulder 1 View Right Result Date: 06/18/2024 EXAM: 1 VIEW(S) XRAY OF THE RIGHT SHOULDER 06/18/2024 06:51:00 PM COMPARISON: None available. CLINICAL HISTORY: post reduction FINDINGS: BONES AND JOINTS: Humeral head in high position over the glenoid with no definite dislocation, possible artifact due to positioning. No acute fracture. Moderate degenerative changes of the acromioclavicular joint. SOFT TISSUES: No abnormal calcifications. Visualized lung is unremarkable. IMPRESSION: 1. No acute findings. Electronically signed by: Morgane Naveau MD 06/18/2024 07:43 PM EST RP Workstation: [id number]   DG Elbow 2 Views Right Result Date: 06/18/2024 CLINICAL DATA:  Fall  with injury to right shoulder and elbow. EXAM: RIGHT ELBOW - 2 VIEW COMPARISON:  None Available. FINDINGS: Very minimal degenerative changes of the right elbow. No evidence of acute fracture or dislocation. No evidence of fat pad displacement. IMPRESSION: No acute findings. Electronically Signed   By: Toribio Agreste M.D.   On: 06/18/2024 17:44   DG Humerus Right Result Date: 06/18/2024 CLINICAL DATA:  Fall injuring right shoulder. EXAM: RIGHT HUMERUS - 2+ VIEW COMPARISON:  None Available. FINDINGS: Anterior/inferior shoulder dislocation. No definite fracture involving the right humerus. IMPRESSION: Anterior/inferior shoulder dislocation. Electronically Signed   By: Toribio Agreste M.D.   On: 06/18/2024 17:43   DG Shoulder Right Result Date: 06/18/2024 CLINICAL DATA:  Fall injuring right shoulder. EXAM: RIGHT SHOULDER - 2+ VIEW COMPARISON:  Chest x-ray 01/11/2014 FINDINGS: Anterior inferior dislocation of the right humeral head. No definite fracture visualized. Mild degenerate change of the [facility] joint. IMPRESSION: Anterior inferior dislocation of the right humeral head. Electronically Signed   By: Toribio Agreste M.D.   On: 06/18/2024 17:42     Medications Ordered in the ED  morphine  (PF) 4 MG/ML injection 4 mg (4 mg Intramuscular Given 06/18/24 1606)  lidocaine  (XYLOCAINE ) 2 % (with pres) injection 200 mg (200 mg Infiltration Given by Other 06/18/24 1834)  LORazepam (ATIVAN) injection 1 mg (1 mg Intravenous Given 06/18/24 1806)  ondansetron  (ZOFRAN ) injection 4 mg (4 mg Intravenous Given 06/18/24 1807)  HYDROmorphone  (DILAUDID ) injection 1 mg (1 mg Intravenous Given 06/18/24 1811)                                   Medical Decision Making Amount and/or Complexity of Data Reviewed Radiology: ordered.  Risk Prescription drug management.   Patient is a 63 year old male who presents to the ED for right shoulder pain after a fall earlier today.  Please see detailed HPI above.  On exam patient is  alert and comfortable sitting on the bed.  Neurovascular intact.  X-ray of the right shoulder reviewed demonstrating an acute anterior dislocation with no obvious fracture.  X-ray of the right humerus and right elbow unremarkable.  A shoulder was successfully reduced with a nerve block and manipulation.  Patient tolerated, Ativan, Zofran .  Reduction confirmed by x-ray.  Patient placed in a sling.  Stable for discharge home.  Resources for orthopedic follow-up provided.  Prescribed Percocet for pain.  Symptomatic care discussed.  Return precautions provided for worsening symptoms.  Case was discussed with attending who agrees with assessment, plan, discharge.  Please see Dr. Leva note for further reduction note/technic.     Final diagnoses:  Dislocation of right shoulder joint, initial encounter    ED Discharge Orders          Ordered    oxyCODONE -acetaminophen  (PERCOCET/ROXICET)  5-325 MG tablet  Every 6 hours PRN        06/18/24 2000               Neysa Thersia RAMAN, PA-C 06/18/24 2030    Patt Alm Macho, MD 06/18/24 [phone]

## 2024-06-18 NOTE — ED Triage Notes (Signed)
 BIBA from home for a fall, injured right shoulder trying to catch his fall, also has some right rib pain. Pt is a BLE amputee 313 cbg 108/65 bp 16 rr 99% r/a 84 hr

## 2024-06-18 NOTE — Discharge Instructions (Signed)
 Wear sling on the arm.  May take Percocet every 6 hours as needed for pain.  Please follow-up with orthopedics for further evaluation and management of shoulder dislocation.  Return to ED sooner if any symptoms worsen including severe uncontrollable pain, new fall/injuries, severe complications of the arm.

## 2024-06-19 ENCOUNTER — Other Ambulatory Visit: Payer: Self-pay

## 2024-06-30 ENCOUNTER — Other Ambulatory Visit: Payer: Self-pay
